# Patient Record
Sex: Male | Born: 1945 | Race: White | Hispanic: No | Marital: Married | Smoking: Never smoker
Health system: Southern US, Community
[De-identification: ages and names within clinical notes are randomized; demographics above are authoritative.]

## PROBLEM LIST (undated history)

## (undated) DIAGNOSIS — C439 Malignant melanoma of skin, unspecified: Secondary | ICD-10-CM

## (undated) DIAGNOSIS — N183 Chronic kidney disease, stage 3 unspecified: Secondary | ICD-10-CM

## (undated) DIAGNOSIS — E785 Hyperlipidemia, unspecified: Secondary | ICD-10-CM

## (undated) DIAGNOSIS — I2510 Atherosclerotic heart disease of native coronary artery without angina pectoris: Secondary | ICD-10-CM

## (undated) DIAGNOSIS — I219 Acute myocardial infarction, unspecified: Secondary | ICD-10-CM

## (undated) DIAGNOSIS — Z86711 Personal history of pulmonary embolism: Secondary | ICD-10-CM

## (undated) DIAGNOSIS — I4819 Other persistent atrial fibrillation: Secondary | ICD-10-CM

## (undated) DIAGNOSIS — I639 Cerebral infarction, unspecified: Secondary | ICD-10-CM

## (undated) DIAGNOSIS — C61 Malignant neoplasm of prostate: Secondary | ICD-10-CM

## (undated) DIAGNOSIS — G459 Transient cerebral ischemic attack, unspecified: Secondary | ICD-10-CM

## (undated) DIAGNOSIS — I10 Essential (primary) hypertension: Secondary | ICD-10-CM

## (undated) DIAGNOSIS — D649 Anemia, unspecified: Secondary | ICD-10-CM

## (undated) DIAGNOSIS — E538 Deficiency of other specified B group vitamins: Secondary | ICD-10-CM

## (undated) DIAGNOSIS — I5189 Other ill-defined heart diseases: Secondary | ICD-10-CM

## (undated) HISTORY — DX: Personal history of pulmonary embolism: Z86.711

## (undated) HISTORY — DX: Atherosclerotic heart disease of native coronary artery without angina pectoris: I25.10

## (undated) HISTORY — DX: Essential (primary) hypertension: I10

## (undated) HISTORY — DX: Cerebral infarction, unspecified: I63.9

## (undated) HISTORY — DX: Deficiency of other specified B group vitamins: E53.8

## (undated) HISTORY — DX: Anemia, unspecified: D64.9

## (undated) HISTORY — PX: CHOLECYSTECTOMY: SHX55

## (undated) HISTORY — PX: KNEE ARTHROSCOPY: SUR90

## (undated) HISTORY — DX: Malignant neoplasm of prostate: C61

## (undated) HISTORY — DX: Hyperlipidemia, unspecified: E78.5

## (undated) HISTORY — DX: Other persistent atrial fibrillation: I48.19

## (undated) HISTORY — DX: Malignant melanoma of skin, unspecified: C43.9

## (undated) HISTORY — PX: PROSTATECTOMY: SHX69

## (undated) HISTORY — PX: TONSILLECTOMY: SUR1361

---

## 1989-10-06 HISTORY — PX: MELANOMA EXCISION: SHX5266

## 2001-03-09 ENCOUNTER — Encounter: Payer: Self-pay | Admitting: Family Medicine

## 2001-03-09 ENCOUNTER — Encounter: Admission: RE | Admit: 2001-03-09 | Discharge: 2001-03-09 | Payer: Self-pay | Admitting: Family Medicine

## 2001-03-22 ENCOUNTER — Encounter: Payer: Self-pay | Admitting: General Surgery

## 2001-03-23 ENCOUNTER — Encounter (INDEPENDENT_AMBULATORY_CARE_PROVIDER_SITE_OTHER): Payer: Self-pay | Admitting: Specialist

## 2001-03-23 ENCOUNTER — Encounter: Payer: Self-pay | Admitting: General Surgery

## 2001-03-23 ENCOUNTER — Observation Stay (HOSPITAL_COMMUNITY): Admission: RE | Admit: 2001-03-23 | Discharge: 2001-03-24 | Payer: Self-pay | Admitting: General Surgery

## 2004-07-03 ENCOUNTER — Encounter: Payer: Self-pay | Admitting: Internal Medicine

## 2004-08-12 ENCOUNTER — Ambulatory Visit: Payer: Self-pay | Admitting: Internal Medicine

## 2004-08-20 ENCOUNTER — Ambulatory Visit: Payer: Self-pay | Admitting: Internal Medicine

## 2004-08-26 ENCOUNTER — Ambulatory Visit: Payer: Self-pay | Admitting: Internal Medicine

## 2004-09-23 ENCOUNTER — Ambulatory Visit: Payer: Self-pay | Admitting: Internal Medicine

## 2004-10-06 HISTORY — PX: COLONOSCOPY: SHX174

## 2004-10-21 ENCOUNTER — Ambulatory Visit: Payer: Self-pay | Admitting: Internal Medicine

## 2004-11-15 ENCOUNTER — Ambulatory Visit: Payer: Self-pay | Admitting: Internal Medicine

## 2004-11-18 ENCOUNTER — Inpatient Hospital Stay (HOSPITAL_COMMUNITY): Admission: RE | Admit: 2004-11-18 | Discharge: 2004-11-21 | Payer: Self-pay | Admitting: Urology

## 2004-11-18 ENCOUNTER — Encounter (INDEPENDENT_AMBULATORY_CARE_PROVIDER_SITE_OTHER): Payer: Self-pay | Admitting: *Deleted

## 2004-12-13 ENCOUNTER — Ambulatory Visit: Payer: Self-pay | Admitting: Internal Medicine

## 2005-01-13 ENCOUNTER — Ambulatory Visit: Payer: Self-pay | Admitting: Internal Medicine

## 2005-02-12 ENCOUNTER — Ambulatory Visit: Payer: Self-pay | Admitting: Internal Medicine

## 2005-03-13 ENCOUNTER — Ambulatory Visit: Payer: Self-pay | Admitting: Internal Medicine

## 2005-04-07 ENCOUNTER — Ambulatory Visit: Payer: Self-pay | Admitting: Gastroenterology

## 2005-04-14 ENCOUNTER — Ambulatory Visit: Payer: Self-pay | Admitting: Internal Medicine

## 2005-04-23 ENCOUNTER — Ambulatory Visit: Payer: Self-pay | Admitting: Gastroenterology

## 2005-05-09 ENCOUNTER — Ambulatory Visit: Payer: Self-pay | Admitting: Internal Medicine

## 2005-06-06 ENCOUNTER — Ambulatory Visit: Payer: Self-pay | Admitting: Internal Medicine

## 2005-07-07 ENCOUNTER — Ambulatory Visit: Payer: Self-pay | Admitting: Internal Medicine

## 2005-08-08 ENCOUNTER — Ambulatory Visit: Payer: Self-pay | Admitting: Internal Medicine

## 2005-09-05 ENCOUNTER — Ambulatory Visit: Payer: Self-pay | Admitting: Internal Medicine

## 2005-09-23 ENCOUNTER — Ambulatory Visit: Payer: Self-pay | Admitting: Internal Medicine

## 2005-10-07 ENCOUNTER — Ambulatory Visit: Payer: Self-pay | Admitting: Internal Medicine

## 2005-11-07 ENCOUNTER — Ambulatory Visit: Payer: Self-pay | Admitting: Internal Medicine

## 2005-12-05 ENCOUNTER — Ambulatory Visit: Payer: Self-pay | Admitting: Internal Medicine

## 2006-01-05 ENCOUNTER — Ambulatory Visit: Payer: Self-pay | Admitting: Internal Medicine

## 2006-02-04 ENCOUNTER — Ambulatory Visit: Payer: Self-pay | Admitting: Internal Medicine

## 2006-02-12 ENCOUNTER — Ambulatory Visit: Payer: Self-pay | Admitting: Cardiology

## 2006-02-12 ENCOUNTER — Ambulatory Visit: Payer: Self-pay | Admitting: Internal Medicine

## 2006-02-12 ENCOUNTER — Observation Stay (HOSPITAL_COMMUNITY): Admission: EM | Admit: 2006-02-12 | Discharge: 2006-02-13 | Payer: Self-pay | Admitting: Emergency Medicine

## 2006-02-16 ENCOUNTER — Ambulatory Visit: Payer: Self-pay

## 2006-02-20 ENCOUNTER — Ambulatory Visit: Payer: Self-pay | Admitting: Internal Medicine

## 2006-03-04 ENCOUNTER — Ambulatory Visit: Payer: Self-pay | Admitting: Internal Medicine

## 2006-04-03 ENCOUNTER — Ambulatory Visit: Payer: Self-pay | Admitting: Internal Medicine

## 2006-05-06 ENCOUNTER — Ambulatory Visit: Payer: Self-pay | Admitting: Internal Medicine

## 2006-06-09 ENCOUNTER — Ambulatory Visit: Payer: Self-pay | Admitting: Internal Medicine

## 2006-06-23 ENCOUNTER — Ambulatory Visit: Payer: Self-pay | Admitting: Internal Medicine

## 2006-07-07 ENCOUNTER — Ambulatory Visit: Payer: Self-pay | Admitting: Internal Medicine

## 2006-08-04 ENCOUNTER — Ambulatory Visit: Payer: Self-pay | Admitting: Internal Medicine

## 2006-09-07 ENCOUNTER — Ambulatory Visit: Payer: Self-pay | Admitting: Internal Medicine

## 2006-09-07 LAB — CONVERTED CEMR LAB
Chol/HDL Ratio, serum: 4.7
Cholesterol: 213 mg/dL (ref 0–200)
Triglyceride fasting, serum: 220 mg/dL (ref 0–149)
VLDL: 44 mg/dL — ABNORMAL HIGH (ref 0–40)

## 2006-10-05 ENCOUNTER — Ambulatory Visit: Payer: Self-pay | Admitting: Internal Medicine

## 2006-11-05 ENCOUNTER — Ambulatory Visit: Payer: Self-pay | Admitting: Internal Medicine

## 2006-12-04 ENCOUNTER — Ambulatory Visit: Payer: Self-pay | Admitting: Internal Medicine

## 2007-01-04 ENCOUNTER — Ambulatory Visit: Payer: Self-pay | Admitting: Internal Medicine

## 2007-01-20 DIAGNOSIS — E781 Pure hyperglyceridemia: Secondary | ICD-10-CM | POA: Insufficient documentation

## 2007-01-20 DIAGNOSIS — Z9089 Acquired absence of other organs: Secondary | ICD-10-CM | POA: Insufficient documentation

## 2007-02-01 ENCOUNTER — Ambulatory Visit: Payer: Self-pay | Admitting: Internal Medicine

## 2007-03-02 ENCOUNTER — Ambulatory Visit: Payer: Self-pay | Admitting: Internal Medicine

## 2007-03-30 ENCOUNTER — Ambulatory Visit: Payer: Self-pay | Admitting: Internal Medicine

## 2007-04-29 ENCOUNTER — Ambulatory Visit: Payer: Self-pay | Admitting: Internal Medicine

## 2007-05-27 ENCOUNTER — Ambulatory Visit: Payer: Self-pay | Admitting: Internal Medicine

## 2007-05-27 ENCOUNTER — Encounter (INDEPENDENT_AMBULATORY_CARE_PROVIDER_SITE_OTHER): Payer: Self-pay | Admitting: *Deleted

## 2007-05-27 DIAGNOSIS — D239 Other benign neoplasm of skin, unspecified: Secondary | ICD-10-CM | POA: Insufficient documentation

## 2007-06-08 ENCOUNTER — Encounter (INDEPENDENT_AMBULATORY_CARE_PROVIDER_SITE_OTHER): Payer: Self-pay | Admitting: *Deleted

## 2007-06-17 ENCOUNTER — Encounter: Payer: Self-pay | Admitting: Internal Medicine

## 2007-07-01 ENCOUNTER — Ambulatory Visit: Payer: Self-pay | Admitting: Internal Medicine

## 2007-08-02 ENCOUNTER — Ambulatory Visit: Payer: Self-pay | Admitting: Internal Medicine

## 2007-08-30 ENCOUNTER — Ambulatory Visit: Payer: Self-pay | Admitting: Internal Medicine

## 2007-09-27 ENCOUNTER — Ambulatory Visit: Payer: Self-pay | Admitting: Internal Medicine

## 2007-10-13 ENCOUNTER — Ambulatory Visit: Payer: Self-pay | Admitting: Internal Medicine

## 2007-10-13 DIAGNOSIS — I10 Essential (primary) hypertension: Secondary | ICD-10-CM | POA: Insufficient documentation

## 2007-10-19 ENCOUNTER — Encounter (INDEPENDENT_AMBULATORY_CARE_PROVIDER_SITE_OTHER): Payer: Self-pay | Admitting: *Deleted

## 2007-10-19 ENCOUNTER — Ambulatory Visit: Payer: Self-pay | Admitting: Internal Medicine

## 2007-10-28 ENCOUNTER — Ambulatory Visit: Payer: Self-pay | Admitting: Internal Medicine

## 2007-10-28 DIAGNOSIS — E785 Hyperlipidemia, unspecified: Secondary | ICD-10-CM | POA: Insufficient documentation

## 2007-11-24 ENCOUNTER — Telehealth (INDEPENDENT_AMBULATORY_CARE_PROVIDER_SITE_OTHER): Payer: Self-pay | Admitting: *Deleted

## 2007-11-25 ENCOUNTER — Ambulatory Visit: Payer: Self-pay | Admitting: Internal Medicine

## 2007-12-23 ENCOUNTER — Ambulatory Visit: Payer: Self-pay | Admitting: Internal Medicine

## 2008-01-25 ENCOUNTER — Ambulatory Visit: Payer: Self-pay | Admitting: Internal Medicine

## 2008-02-22 ENCOUNTER — Ambulatory Visit: Payer: Self-pay | Admitting: Internal Medicine

## 2008-03-23 ENCOUNTER — Ambulatory Visit: Payer: Self-pay | Admitting: Internal Medicine

## 2008-04-20 ENCOUNTER — Ambulatory Visit: Payer: Self-pay | Admitting: Internal Medicine

## 2008-05-18 ENCOUNTER — Ambulatory Visit: Payer: Self-pay | Admitting: Internal Medicine

## 2008-06-15 ENCOUNTER — Ambulatory Visit: Payer: Self-pay | Admitting: Internal Medicine

## 2008-07-13 ENCOUNTER — Ambulatory Visit: Payer: Self-pay | Admitting: Internal Medicine

## 2008-08-10 ENCOUNTER — Ambulatory Visit: Payer: Self-pay | Admitting: Internal Medicine

## 2008-09-07 ENCOUNTER — Ambulatory Visit: Payer: Self-pay | Admitting: Internal Medicine

## 2008-10-05 ENCOUNTER — Ambulatory Visit: Payer: Self-pay | Admitting: Internal Medicine

## 2008-11-02 ENCOUNTER — Ambulatory Visit: Payer: Self-pay | Admitting: Internal Medicine

## 2008-11-30 ENCOUNTER — Ambulatory Visit: Payer: Self-pay | Admitting: Internal Medicine

## 2008-12-28 ENCOUNTER — Ambulatory Visit: Payer: Self-pay | Admitting: Internal Medicine

## 2009-01-12 ENCOUNTER — Telehealth (INDEPENDENT_AMBULATORY_CARE_PROVIDER_SITE_OTHER): Payer: Self-pay | Admitting: *Deleted

## 2009-01-25 ENCOUNTER — Ambulatory Visit: Payer: Self-pay | Admitting: Internal Medicine

## 2009-02-22 ENCOUNTER — Ambulatory Visit: Payer: Self-pay | Admitting: Internal Medicine

## 2009-03-09 ENCOUNTER — Telehealth (INDEPENDENT_AMBULATORY_CARE_PROVIDER_SITE_OTHER): Payer: Self-pay | Admitting: *Deleted

## 2009-03-22 ENCOUNTER — Ambulatory Visit: Payer: Self-pay | Admitting: Internal Medicine

## 2009-04-17 ENCOUNTER — Telehealth (INDEPENDENT_AMBULATORY_CARE_PROVIDER_SITE_OTHER): Payer: Self-pay | Admitting: *Deleted

## 2009-04-19 ENCOUNTER — Telehealth (INDEPENDENT_AMBULATORY_CARE_PROVIDER_SITE_OTHER): Payer: Self-pay | Admitting: *Deleted

## 2009-04-19 ENCOUNTER — Ambulatory Visit: Payer: Self-pay | Admitting: Internal Medicine

## 2009-04-20 ENCOUNTER — Telehealth (INDEPENDENT_AMBULATORY_CARE_PROVIDER_SITE_OTHER): Payer: Self-pay | Admitting: *Deleted

## 2009-04-24 ENCOUNTER — Ambulatory Visit: Payer: Self-pay | Admitting: Internal Medicine

## 2009-04-24 DIAGNOSIS — Z85820 Personal history of malignant melanoma of skin: Secondary | ICD-10-CM | POA: Insufficient documentation

## 2009-04-24 DIAGNOSIS — C439 Malignant melanoma of skin, unspecified: Secondary | ICD-10-CM | POA: Insufficient documentation

## 2009-04-24 DIAGNOSIS — D510 Vitamin B12 deficiency anemia due to intrinsic factor deficiency: Secondary | ICD-10-CM | POA: Insufficient documentation

## 2009-04-24 DIAGNOSIS — G250 Essential tremor: Secondary | ICD-10-CM | POA: Insufficient documentation

## 2009-04-24 DIAGNOSIS — Z8546 Personal history of malignant neoplasm of prostate: Secondary | ICD-10-CM | POA: Insufficient documentation

## 2009-04-24 DIAGNOSIS — R251 Tremor, unspecified: Secondary | ICD-10-CM | POA: Insufficient documentation

## 2009-04-24 DIAGNOSIS — G252 Other specified forms of tremor: Secondary | ICD-10-CM

## 2009-04-24 LAB — CONVERTED CEMR LAB: LDL Goal: 130 mg/dL

## 2009-04-25 ENCOUNTER — Encounter: Payer: Self-pay | Admitting: Internal Medicine

## 2009-05-02 ENCOUNTER — Encounter (INDEPENDENT_AMBULATORY_CARE_PROVIDER_SITE_OTHER): Payer: Self-pay | Admitting: *Deleted

## 2009-05-02 LAB — CONVERTED CEMR LAB
ALT: 24 units/L (ref 0–53)
BUN: 25 mg/dL — ABNORMAL HIGH (ref 6–23)
Basophils Absolute: 0 10*3/uL (ref 0.0–0.1)
Calcium: 9.2 mg/dL (ref 8.4–10.5)
Direct LDL: 121.5 mg/dL
Eosinophils Absolute: 0.1 10*3/uL (ref 0.0–0.7)
Glucose, Bld: 81 mg/dL (ref 70–99)
HDL: 44.7 mg/dL (ref >39.00)
MCHC: 34.2 g/dL (ref 30.0–36.0)
MCV: 84.6 fL (ref 78.0–100.0)
Monocytes Absolute: 0.4 10*3/uL (ref 0.1–1.0)
Neutrophils Relative %: 56.1 % (ref 43.0–77.0)
Potassium: 4 meq/L (ref 3.5–5.1)
RBC: 4.69 M/uL (ref 4.22–5.81)
TSH: 2.22 microintl units/mL (ref 0.35–5.50)
Total Bilirubin: 1 mg/dL (ref 0.3–1.2)
Total CHOL/HDL Ratio: 5
Total Protein: 7 g/dL (ref 6.0–8.3)
Triglycerides: 169 mg/dL — ABNORMAL HIGH (ref 0.0–149.0)
Vitamin B-12: 666 pg/mL (ref 211–911)

## 2009-05-17 ENCOUNTER — Ambulatory Visit: Payer: Self-pay | Admitting: Internal Medicine

## 2009-06-18 ENCOUNTER — Ambulatory Visit: Payer: Self-pay | Admitting: Internal Medicine

## 2009-07-18 ENCOUNTER — Ambulatory Visit: Payer: Self-pay | Admitting: Internal Medicine

## 2009-08-22 ENCOUNTER — Ambulatory Visit: Payer: Self-pay | Admitting: Internal Medicine

## 2009-09-21 ENCOUNTER — Ambulatory Visit: Payer: Self-pay | Admitting: Internal Medicine

## 2009-10-06 HISTORY — PX: MELANOMA EXCISION: SHX5266

## 2009-10-22 ENCOUNTER — Ambulatory Visit: Payer: Self-pay | Admitting: Internal Medicine

## 2009-11-15 ENCOUNTER — Ambulatory Visit: Payer: Self-pay | Admitting: Internal Medicine

## 2009-11-26 ENCOUNTER — Ambulatory Visit: Payer: Self-pay | Admitting: Internal Medicine

## 2009-11-28 ENCOUNTER — Telehealth: Payer: Self-pay | Admitting: Internal Medicine

## 2009-11-28 DIAGNOSIS — R05 Cough: Secondary | ICD-10-CM | POA: Insufficient documentation

## 2009-11-28 DIAGNOSIS — R051 Acute cough: Secondary | ICD-10-CM | POA: Insufficient documentation

## 2009-11-28 DIAGNOSIS — R059 Cough, unspecified: Secondary | ICD-10-CM | POA: Insufficient documentation

## 2009-11-29 ENCOUNTER — Ambulatory Visit: Payer: Self-pay | Admitting: Internal Medicine

## 2009-11-30 ENCOUNTER — Telehealth: Payer: Self-pay | Admitting: Family Medicine

## 2009-12-10 LAB — CONVERTED CEMR LAB
Basophils Absolute: 0 10*3/uL (ref 0.0–0.1)
Basophils Relative: 0.5 % (ref 0.0–3.0)
Eosinophils Absolute: 0.3 10*3/uL (ref 0.0–0.7)
HCT: 42.1 % (ref 39.0–52.0)
Hemoglobin: 14 g/dL (ref 13.0–17.0)
Lymphocytes Relative: 27.9 % (ref 12.0–46.0)
Lymphs Abs: 1.6 10*3/uL (ref 0.7–4.0)
Monocytes Relative: 6.8 % (ref 3.0–12.0)
Neutro Abs: 3.6 10*3/uL (ref 1.4–7.7)
RDW: 13.6 % (ref 11.5–14.6)

## 2009-12-25 ENCOUNTER — Telehealth (INDEPENDENT_AMBULATORY_CARE_PROVIDER_SITE_OTHER): Payer: Self-pay | Admitting: *Deleted

## 2009-12-25 ENCOUNTER — Ambulatory Visit: Payer: Self-pay | Admitting: Internal Medicine

## 2010-01-23 ENCOUNTER — Telehealth (INDEPENDENT_AMBULATORY_CARE_PROVIDER_SITE_OTHER): Payer: Self-pay | Admitting: *Deleted

## 2010-01-23 ENCOUNTER — Ambulatory Visit: Payer: Self-pay | Admitting: Internal Medicine

## 2010-02-03 HISTORY — PX: OTHER SURGICAL HISTORY: SHX169

## 2010-02-05 ENCOUNTER — Telehealth: Payer: Self-pay | Admitting: Internal Medicine

## 2010-02-11 ENCOUNTER — Ambulatory Visit: Payer: Self-pay | Admitting: Cardiology

## 2010-02-11 ENCOUNTER — Inpatient Hospital Stay (HOSPITAL_COMMUNITY): Admission: EM | Admit: 2010-02-11 | Discharge: 2010-02-13 | Payer: Self-pay | Admitting: Emergency Medicine

## 2010-02-19 ENCOUNTER — Encounter: Payer: Self-pay | Admitting: Cardiology

## 2010-02-22 ENCOUNTER — Ambulatory Visit: Payer: Self-pay | Admitting: Internal Medicine

## 2010-03-01 ENCOUNTER — Ambulatory Visit: Payer: Self-pay | Admitting: Cardiology

## 2010-03-01 DIAGNOSIS — I2510 Atherosclerotic heart disease of native coronary artery without angina pectoris: Secondary | ICD-10-CM | POA: Insufficient documentation

## 2010-03-05 ENCOUNTER — Ambulatory Visit (HOSPITAL_COMMUNITY): Admission: RE | Admit: 2010-03-05 | Discharge: 2010-03-05 | Payer: Self-pay | Admitting: Cardiology

## 2010-03-05 ENCOUNTER — Encounter: Payer: Self-pay | Admitting: Cardiology

## 2010-03-20 ENCOUNTER — Telehealth: Payer: Self-pay | Admitting: Cardiology

## 2010-03-22 ENCOUNTER — Ambulatory Visit: Payer: Self-pay | Admitting: Internal Medicine

## 2010-03-28 ENCOUNTER — Telehealth: Payer: Self-pay | Admitting: Cardiology

## 2010-03-29 ENCOUNTER — Ambulatory Visit: Payer: Self-pay

## 2010-03-29 ENCOUNTER — Ambulatory Visit: Payer: Self-pay | Admitting: Cardiology

## 2010-03-29 ENCOUNTER — Encounter: Payer: Self-pay | Admitting: Cardiology

## 2010-03-29 ENCOUNTER — Ambulatory Visit (HOSPITAL_COMMUNITY): Admission: RE | Admit: 2010-03-29 | Discharge: 2010-03-29 | Payer: Self-pay | Admitting: Cardiology

## 2010-04-15 ENCOUNTER — Ambulatory Visit: Payer: Self-pay | Admitting: Cardiology

## 2010-04-18 ENCOUNTER — Encounter: Payer: Self-pay | Admitting: Cardiology

## 2010-04-19 ENCOUNTER — Ambulatory Visit: Payer: Self-pay | Admitting: Internal Medicine

## 2010-04-23 LAB — CONVERTED CEMR LAB
AST: 26 units/L (ref 0–37)
Albumin: 4.1 g/dL (ref 3.5–5.2)
Alkaline Phosphatase: 58 units/L (ref 39–117)
Basophils Absolute: 0 10*3/uL (ref 0.0–0.1)
Basophils Relative: 0.5 % (ref 0.0–3.0)
Bilirubin, Direct: 0.1 mg/dL (ref 0.0–0.3)
Direct LDL: 57.7 mg/dL
Lymphocytes Relative: 31.2 % (ref 12.0–46.0)
Lymphs Abs: 1.5 10*3/uL (ref 0.7–4.0)
MCHC: 35.1 g/dL (ref 30.0–36.0)
MCV: 84.3 fL (ref 78.0–100.0)
Monocytes Absolute: 0.4 10*3/uL (ref 0.1–1.0)
Neutro Abs: 2.7 10*3/uL (ref 1.4–7.7)
Neutrophils Relative %: 54.3 % (ref 43.0–77.0)
RBC: 4.45 M/uL (ref 4.22–5.81)
RDW: 14.4 % (ref 11.5–14.6)
Triglycerides: 232 mg/dL — ABNORMAL HIGH (ref 0.0–149.0)
WBC: 5 10*3/uL (ref 4.5–10.5)

## 2010-05-06 ENCOUNTER — Ambulatory Visit: Payer: Self-pay | Admitting: Internal Medicine

## 2010-05-13 LAB — CONVERTED CEMR LAB
AST: 22 units/L (ref 0–37)
Albumin: 4.2 g/dL (ref 3.5–5.2)
Alkaline Phosphatase: 61 units/L (ref 39–117)
Basophils Absolute: 0 10*3/uL (ref 0.0–0.1)
CO2: 30 meq/L (ref 19–32)
Calcium: 9.2 mg/dL (ref 8.4–10.5)
Creatinine, Ser: 1.6 mg/dL — ABNORMAL HIGH (ref 0.4–1.5)
Eosinophils Absolute: 0.3 10*3/uL (ref 0.0–0.7)
HCT: 38.8 % — ABNORMAL LOW (ref 39.0–52.0)
Hemoglobin: 13.4 g/dL (ref 13.0–17.0)
MCHC: 34.4 g/dL (ref 30.0–36.0)
Monocytes Absolute: 0.4 10*3/uL (ref 0.1–1.0)
Neutro Abs: 2.8 10*3/uL (ref 1.4–7.7)
PSA: 0.01 ng/mL — ABNORMAL LOW (ref 0.10–4.00)
Platelets: 156 10*3/uL (ref 150.0–400.0)
RBC: 4.58 M/uL (ref 4.22–5.81)
RDW: 15.1 % — ABNORMAL HIGH (ref 11.5–14.6)
Total Bilirubin: 0.7 mg/dL (ref 0.3–1.2)
VLDL: 56.8 mg/dL — ABNORMAL HIGH (ref 0.0–40.0)
Vitamin B-12: 489 pg/mL (ref 211–911)

## 2010-05-17 ENCOUNTER — Ambulatory Visit: Payer: Self-pay | Admitting: Internal Medicine

## 2010-06-03 ENCOUNTER — Telehealth (INDEPENDENT_AMBULATORY_CARE_PROVIDER_SITE_OTHER): Payer: Self-pay | Admitting: *Deleted

## 2010-06-03 ENCOUNTER — Ambulatory Visit: Payer: Self-pay | Admitting: Family Medicine

## 2010-06-03 DIAGNOSIS — L299 Pruritus, unspecified: Secondary | ICD-10-CM | POA: Insufficient documentation

## 2010-06-04 LAB — CONVERTED CEMR LAB
AST: 21 units/L (ref 0–37)
Alkaline Phosphatase: 62 units/L (ref 39–117)
Bilirubin, Direct: 0.1 mg/dL (ref 0.0–0.3)
CO2: 30 meq/L (ref 19–32)
Calcium: 9.3 mg/dL (ref 8.4–10.5)
Chloride: 104 meq/L (ref 96–112)
Creatinine, Ser: 1.5 mg/dL (ref 0.4–1.5)
Potassium: 4.4 meq/L (ref 3.5–5.1)
Sodium: 142 meq/L (ref 135–145)
Total Protein: 6.6 g/dL (ref 6.0–8.3)

## 2010-06-14 ENCOUNTER — Ambulatory Visit: Payer: Self-pay | Admitting: Internal Medicine

## 2010-06-14 DIAGNOSIS — N183 Chronic kidney disease, stage 3 unspecified: Secondary | ICD-10-CM | POA: Insufficient documentation

## 2010-06-14 DIAGNOSIS — D489 Neoplasm of uncertain behavior, unspecified: Secondary | ICD-10-CM | POA: Insufficient documentation

## 2010-06-17 ENCOUNTER — Ambulatory Visit: Payer: Self-pay | Admitting: Cardiology

## 2010-07-12 ENCOUNTER — Ambulatory Visit: Payer: Self-pay | Admitting: Family Medicine

## 2010-08-13 ENCOUNTER — Ambulatory Visit: Payer: Self-pay | Admitting: Internal Medicine

## 2010-09-10 ENCOUNTER — Ambulatory Visit: Payer: Self-pay | Admitting: Cardiology

## 2010-09-10 ENCOUNTER — Encounter: Payer: Self-pay | Admitting: Cardiology

## 2010-09-12 ENCOUNTER — Ambulatory Visit: Payer: Self-pay | Admitting: Internal Medicine

## 2010-10-14 ENCOUNTER — Ambulatory Visit
Admission: RE | Admit: 2010-10-14 | Discharge: 2010-10-14 | Payer: Self-pay | Source: Home / Self Care | Attending: Internal Medicine | Admitting: Internal Medicine

## 2010-10-14 ENCOUNTER — Other Ambulatory Visit: Payer: Self-pay | Admitting: Internal Medicine

## 2010-10-14 LAB — LIPID PANEL
Cholesterol: 145 mg/dL (ref 0–200)
HDL: 36.6 mg/dL — ABNORMAL LOW (ref >39.00)
Total CHOL/HDL Ratio: 4
Triglycerides: 287 mg/dL — ABNORMAL HIGH (ref 0.0–149.0)
VLDL: 57.4 mg/dL — ABNORMAL HIGH (ref 0.0–40.0)

## 2010-10-14 LAB — LDL CHOLESTEROL, DIRECT: Direct LDL: 76.2 mg/dL

## 2010-10-25 ENCOUNTER — Telehealth (INDEPENDENT_AMBULATORY_CARE_PROVIDER_SITE_OTHER): Payer: Self-pay | Admitting: *Deleted

## 2010-11-01 ENCOUNTER — Ambulatory Visit
Admission: RE | Admit: 2010-11-01 | Discharge: 2010-11-01 | Payer: Self-pay | Source: Home / Self Care | Attending: Family Medicine | Admitting: Family Medicine

## 2010-11-01 DIAGNOSIS — J45909 Unspecified asthma, uncomplicated: Secondary | ICD-10-CM | POA: Insufficient documentation

## 2010-11-03 LAB — CONVERTED CEMR LAB
AST: 25 units/L (ref 0–37)
Albumin: 4.2 g/dL (ref 3.5–5.2)
Alkaline Phosphatase: 63 units/L (ref 39–117)
BUN: 20 mg/dL (ref 6–23)
Basophils Absolute: 0 10*3/uL (ref 0.0–0.1)
Basophils Relative: 0.4 % (ref 0.0–1.0)
Bilirubin, Direct: 0.1 mg/dL (ref 0.0–0.3)
Creatinine, Ser: 1.5 mg/dL (ref 0.4–1.5)
Eosinophils Absolute: 0.2 10*3/uL (ref 0.0–0.6)
Eosinophils Absolute: 0.2 10*3/uL (ref 0.0–0.6)
Eosinophils Relative: 3.6 % (ref 0.0–5.0)
Eosinophils Relative: 4.3 % (ref 0.0–5.0)
Free T4: 0.8 ng/dL (ref 0.6–1.6)
Free T4: 0.8 ng/dL (ref 0.6–1.6)
GFR calc non Af Amer: 50 mL/min
HCT: 37.8 % — ABNORMAL LOW (ref 39.0–52.0)
HDL goal, serum: 40 mg/dL
Hemoglobin: 13.2 g/dL (ref 13.0–17.0)
Monocytes Absolute: 0.5 10*3/uL (ref 0.2–0.7)
Neutro Abs: 3 10*3/uL (ref 1.4–7.7)
Neutrophils Relative %: 58.5 % (ref 43.0–77.0)
Platelets: 189 10*3/uL (ref 150–400)
Platelets: 192 10*3/uL (ref 150–400)
Potassium: 3.7 meq/L (ref 3.5–5.1)
RBC: 4.53 M/uL (ref 4.22–5.81)
RBC: 4.94 M/uL (ref 4.22–5.81)
RDW: 13.6 % (ref 11.5–14.6)
RDW: 14.1 % (ref 11.5–14.6)
Sodium: 138 meq/L (ref 135–145)
TSH: 2.77 microintl units/mL (ref 0.35–5.50)

## 2010-11-04 ENCOUNTER — Ambulatory Visit
Admission: RE | Admit: 2010-11-04 | Discharge: 2010-11-04 | Payer: Self-pay | Source: Home / Self Care | Attending: Internal Medicine | Admitting: Internal Medicine

## 2010-11-07 NOTE — Assessment & Plan Note (Signed)
Summary: per check out/sf      Allergies Added:   Primary [name]:  Dr. Alwyn Ren  CC:  rov.  Pt states he is feeling pretty good. He does also state that he is SOB more easily though and has no energy.  History of Present Illness: 65 yo with history of CAD s/p recent NSTEMI presents for followup.  Patient was admitted to [facility] in 5/11 with NSTEMI.  He had a PROMUS DES to the mid RCA.  He had residual moderate disease (60-70%) in the LAD that was not intervened upon.    Patient has been getting to his usual activities since his MI.  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  He has noted chest pain ("like gas") with heavy exertion such as using his chain saw or weed-eater.  He attributes this to GERD.  He also has noted shortness of breath (tends to be mild) after walking up a hill.  He was unable to tolerate lisinopril even at a low dose so has stopped it.  He is taking his other meds.  P2Y12 testing showed good platelet inhibition with Plavix.  Recent echo showed preserved EF.  Recent labs showed elevated triglycerides and low HDL.  Patient has already talked to Dr. Alwyn Ren about dietary changes to help this.    Labs (5/11): creatinine 1.56, TGs 478, HDL 35, P2Y12 testing with PRU < 230 (goal) Labs (8/11): LDL 69, HDL 35, TGs 284, creatinine 1.5, TSH normal  Current Medications (verified): 1)  B-12 Injection .... Q Month 2)  Otc K+ .... Prn 3)  Aspirin 325 Mg Tabs (Aspirin) .Marland Kitchen.. 1 By Mouth Once Daily 4)  Metoprolol Tartrate 50 Mg Tabs (Metoprolol Tartrate) .Marland Kitchen.. 1 By Mouth  Two Times A Day 5)  Plavix 75 Mg Tabs (Clopidogrel Bisulfate) .Marland Kitchen.. 1 By Mouth Once Daily 6)  Zocor 40 Mg Tabs (Simvastatin) .Marland Kitchen.. 1 By Mouth Once Daily 7)  Fish Oil 1000 Mg Caps (Omega-3 Fatty Acids) .... One Tablet Twice A Day  Allergies (verified): 1)  ! * Hydroxyzine 2)  Lidocaine  Past History:  Past Medical History: 1. Hypertension: Did not tolerate lisinopril even at low dose.  2. Hyperlipidemia 3. mild  anemia, B12 deficiency,PMH of 4. Prostate cancer, hx of 5. Skin cancer, PMH  of 6. S/P  TKR 7. CAD: NSTEMI 5/11.  LHC showed 60% prox LAD, 70% mid LAD, 95% mid RCA with TIMI 2 flow distally, EF 60%.  Patient had PROMUS DES to [facility].  8. Renal insufficiency, creatinine 1.5  in 05/2010  Family History: Reviewed history from 06/14/2010 and no changes required. mother :CAD S/P  CABG  in 10s, CVA father: prostate cancer; bro: HTN  Social History: Reviewed history from 06/14/2010 and no changes required. Never Smoked Retired from [employer], works on his farm in [place] Alcohol use-no Regular exercise: physically active on farm Married  Review of Systems       All systems reviewed and negative except as per HPI.   Vital Signs:  Patient profile:   65 year old male Height:      75 inches Weight:      218 pounds BMI:     27.35 Pulse rate:   62 / minute Pulse rhythm:   regular BP sitting:   136 / 78  (left arm) Cuff size:   regular  Vitals Entered By: Judithe Modest CMA (June 17, 2010 10:46 AM)  Physical Exam  General:  Well developed, well nourished, in no  acute distress. Neck:  Neck supple, no JVD. No masses, thyromegaly or abnormal cervical nodes. Lungs:  Clear bilaterally to auscultation and percussion. Heart:  Non-displaced PMI, chest non-tender; regular rate and rhythm, S1, S2 without murmurs, rubs. Soft S4. Carotid upstroke normal, no bruit. Pedals normal pulses. No edema, no varicosities. Abdomen:  Bowel sounds positive; abdomen soft and non-tender without masses, organomegaly, or hernias noted. No hepatosplenomegaly. Extremities:  No clubbing or cyanosis. Neurologic:  Alert and oriented x 3. Psych:  Normal affect.   Impression & Recommendations:  Problem # 1:  CORONARY ATHEROSCLEROSIS NATIVE CORONARY ARTERY (ICD-414.01) Status post DES to the RCA in the setting of NSTEMI.  Patient had residual moderate disease in the LAD.  He now reports symptoms  consistent with angina with heavy exertion.  He has not had symptoms with mild to moderate exertion or at rest.  I had a long discussion this him about these symptoms.  His wife has been worried.  I offered a stress myoview to assess for ischemia in the LAD distribution with PCI if there was significant ischemia.  He does not want any further procedures if he can help it and thinks that the symptoms are mild enough that he can tolerate them.  He does not want to try Imdur.  It is reasonably to proceed conservatively at this point as his symptoms are not unstable and really intervention would not be expected in this setting to decrease mortality or MI risk (would only potentially decrease symptoms).  He will let me know if the symptoms worsen or come more often and I will see him back to reassess in 3 months.  He will continue ASA, metoprolol, Plavix, and statin.   Problem # 2:  HYPERLIPIDEMIA (ICD-272.4) LDL was at goal when last checked.  HDL is low and triglycerides are high.  He has been talking to Dr. Alwyn Ren about dietary changes to help this profile.  Continue current statin dose.   Problem # 3:  HYPERTENSION (ICD-401.9) BP is at goal today.   Patient Instructions: 1)  Appointment with Dr Shirlee Latch Tuesday December 6,2011 at 8:15am.:

## 2010-11-07 NOTE — Assessment & Plan Note (Signed)
Summary: b12/cbs   Nurse Visit  CC: B-12 inj./kb   Allergies: 1)  ! * Hydroxyzine 2)  Lidocaine  Medication Administration  Injection # 1:    Medication: Vit B12 1000 mcg    Diagnosis: PERNICIOUS ANEMIA (ICD-281.0)    Route: IM    Site: R deltoid    Exp Date: 12/05/2011    Lot #: [id number]    Mfr: American Regent    Patient tolerated injection without complications    Given by: Lucious Groves CMA (July 12, 2010 10:16 AM)  Orders Added: 1)  Vit B12 1000 mcg [J3420] 2)  Admin of Therapeutic Inj  intramuscular or subcutaneous [16109]

## 2010-11-07 NOTE — Assessment & Plan Note (Signed)
Summary: B-12 SHOT///SPH   Nurse Visit  CC: B-12 inj./kb   Allergies: 1)  ! * Hydroxyzine 2)  Lidocaine  Medication Administration  Injection # 1:    Medication: Vit B12 1000 mcg    Diagnosis: PERNICIOUS ANEMIA (ICD-281.0)    Route: IM    Site: L deltoid    Exp Date: 12/05/2011    Lot #: [id number]    Mfr: American Regent    Patient tolerated injection without complications    Given by: Lucious Groves CMA (September 12, 2010 9:07 AM)  Orders Added: 1)  Vit B12 1000 mcg [J3420] 2)  Admin of Therapeutic Inj  intramuscular or subcutaneous [16109]

## 2010-11-07 NOTE — Progress Notes (Signed)
Summary: LABS ORDERS   Phone Note Call from Patient   Summary of Call: PATIENT CAME IN AND MADE AND APPT FOR LABS WORK TO BE DONE ON 05-06-10. TO CHECK AND SEE WHEN HE CAN STOP TAKING THOSE B-12 SHOT. NEED ORDERS FOR THIS TO BE PUT IN ON THAT DAY Initial call taken by: Freddy Jaksch,  January 23, 2010 11:22 AM  Follow-up for Phone Call        Patient will be due for CPX 04/24/10 or later. These labs below can be before that appointment or on the same day (Includes b12 level)   LIPID,HEP,CBCD,BMP,TSH,PSA,B12,STOOL CARDS,UDIP 995.20/285.9/401.9/272.4 Follow-up by: Shonna Chock,  January 23, 2010 1:25 PM  Additional Follow-up for Phone Call Additional follow up Details #1::        ORDERS PUT INTO PLACE Additional Follow-up by: Freddy Jaksch,  January 24, 2010 8:58 AM

## 2010-11-07 NOTE — Assessment & Plan Note (Signed)
Summary: b12 inj//lch   Nurse Visit   Allergies: 1)  Lidocaine  Medication Administration  Injection # 1:    Medication: Vit B12 1000 mcg    Diagnosis: PERNICIOUS ANEMIA (ICD-281.0)    Route: IM    Site: R deltoid    Exp Date: 08/2011    Lot #: [id number]    Mfr: American Regent    Patient tolerated injection without complications    Given by: Shonna Chock (January 23, 2010 9:00 AM)  Orders Added: 1)  Vit B12 1000 mcg [J3420] 2)  Admin of Therapeutic Inj  intramuscular or subcutaneous [16109]

## 2010-11-07 NOTE — Assessment & Plan Note (Signed)
Summary: f42m  Medications Added * OTC K+ daily        Primary Provider:  Dr. Alwyn Ren  CC:  tiredness.  History of Present Illness: 65 yo with history of CAD s/p recent NSTEMI presents for followup.  Patient was admitted to [facility] in 5/11 with NSTEMI.  He had a PROMUS DES to the mid RCA.  He had residual moderate disease (60-70%) in the LAD that was not intervened upon.   P2Y12 testing showed good platelet inhibition with Plavix.  Recent echo showed preserved EF.    Since last appointment, patient has been doing well.  He has had no further episodes of atypical chest pain and thinks that what he was having before may have been GI-related.  No exertional dyspnea.  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  In fact, he just roofed a barn on his property.  His main complaint is low energy level/fatigue.  This has been going on for a while.  His TSH was normal in 8/11.  He has B12 insufficiency but is being treated and his hemoglobin level has been ok.  He says that he snores but does not think that it is excessive and his wife has never told him that he gasps or stops breathing.   Labs (5/11): creatinine 1.56, TGs 478, HDL 35, P2Y12 testing with PRU < 230 (goal) Labs (8/11): LDL 69, HDL 35, TGs 284, creatinine 1.5, TSH normal  ECG: NSR, normal  Current Medications (verified): 1)  B-12 Injection .... Q Month 2)  Otc K+ .... Daily 3)  Aspirin 325 Mg Tabs (Aspirin) .Marland Kitchen.. 1 By Mouth Once Daily 4)  Metoprolol Tartrate 50 Mg Tabs (Metoprolol Tartrate) .Marland Kitchen.. 1 By Mouth  Two Times A Day 5)  Plavix 75 Mg Tabs (Clopidogrel Bisulfate) .Marland Kitchen.. 1 By Mouth Once Daily 6)  Zocor 40 Mg Tabs (Simvastatin) .Marland Kitchen.. 1 By Mouth Once Daily 7)  Fish Oil 1000 Mg Caps (Omega-3 Fatty Acids) .... One Tablet Twice A Day  Allergies: 1)  ! * Hydroxyzine 2)  Lidocaine  Past History:  Past Medical History: 1. Hypertension: Did not tolerate lisinopril even at low dose.  2.  Hyperlipidemia 3. Mild anemia, B12 deficiency,PMH of 4. Prostate cancer, hx of 5. Skin cancer, PMH  of 6. S/P  TKR 7. CAD: NSTEMI 5/11.  LHC showed 60% prox LAD, 70% mid LAD, 95% mid RCA with TIMI 2 flow distally, EF 60%.  Patient had PROMUS DES to [facility].  8. Renal insufficiency, creatinine 1.5  in 05/2010  Family History: Reviewed history from 09/09/2010 and no changes required. mother :CAD S/P  CABG  in 30s, CVA father: prostate cancer; bro: HTN  Social History: Reviewed history from 06/14/2010 and no changes required. Never Smoked Retired from 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 Neck:  Neck supple, no JVD. No masses, thyromegaly or abnormal cervical nodes. Lungs:  Clear bilaterally to auscultation and  percussion. Heart:  Non-displaced PMI, chest non-tender; regular rate and rhythm, S1, S2 without murmurs, rubs. Soft S4. Carotid upstroke normal, no bruit. Pedals normal pulses. No edema, no varicosities. Abdomen:  Bowel sounds positive; abdomen soft and non-tender without masses, organomegaly, or hernias noted. No hepatosplenomegaly. Extremities:  No clubbing or cyanosis. Neurologic:  Alert and oriented x 3. Psych:  Normal affect.   Impression & Recommendations:  Problem # 1:  CORONARY ATHEROSCLEROSIS NATIVE CORONARY ARTERY (ICD-414.01) Status post DES to the RCA in the setting of NSTEMI.  Patient had residual moderate disease in the LAD.  At last appointment, he reported atypical chest pain episodes.  However, these have completely resolved and he has had no pain  since that time.  He is doing well symptomatically.  Continue ASA, Plavix, metoprolol, simvastatin.   Problem # 2:  HYPERTENSION (ICD-401.9) BP is at goal today.   Problem # 3:  HYPERLIPIDEMIA (ICD-272.4) LDL was at goal when last checked.  HDL is low and triglycerides are high.  He has been talking to Dr. Alwyn Ren about dietary changes to help this profile.  Continue current statin dose.   Problem # 4:  FATIGUE This has been chronic.  He feels tired in the afternoon.  TSH and hemoglobin have been ok.  Metoprolol could be the culprit.  He will ask his wife to watch him while sleeping to see if he stops breathing or gasps at night.  If his sleep habits are worrisome for OSA, would get sleep study.   Patient Instructions: 1)  Your physician wants you to follow-up in: 6 months with Dr Shirlee Latch. Joycie Peek 2012)  You will receive a reminder letter in the mail two months in advance. If you don't receive a letter, please call our office to schedule the follow-up appointment.     Vital Signs:  Patient profile:   65 year old male Height:      75 inches Weight:      218.50 pounds BMI:     27.41 Pulse rate:   62 / minute BP sitting:   134 / 78  (left arm)  Vitals Entered By: Katina Dung, RN, BSN (September 10, 2010 8:42 AM)

## 2010-11-07 NOTE — Progress Notes (Signed)
Summary: Triage: B12 concerns  Phone Note Call from Patient Call back at Home Phone [phone]   Caller: Patient Summary of Call: Patient was here for B12 injection and asked "Can I ever stop getting these B12 injections?" patient stated that his last lab values were fine.  Dr.Hopper please advise Initial call taken by: Shonna Chock,  December 25, 2009 8:39 AM  Follow-up for Phone Call        Hold B12 shots & check B12 level in 4 months (281.0) Follow-up by: Marga Melnick MD,  December 25, 2009 12:43 PM  Additional Follow-up for Phone Call Additional follow up Details #1::        left message to call  office...........Marland KitchenFelecia Deloach CMA  December 25, 2009 2:30 PM  pt return call and left cell # to call back. called pt back no VM set up will try again later...........Marland KitchenFelecia Deloach CMA  December 25, 2009 3:40 PM     Additional Follow-up for Phone Call Additional follow up Details #2::    pt aware will schedule appt when he come in for 2 month appt  ...............................Marland KitchenFelecia Deloach CMA  December 26, 2009 8:49 AM

## 2010-11-07 NOTE — Progress Notes (Signed)
Summary: XRAY RESULTS  Phone Note Call from Patient Call back at Home Phone [phone]   Caller: Spouse Call For: Brian Melnick MD Reason for Call: Lab or Test Results Summary of Call: PATIENT SPOUSE ANN CALLING, THEY ARE VERY ANXIOUS FOR THE CHEST XRAY RESULTS.  PATIENT STILL WITH A VERY BAD COUGH.   Initial call taken by: Magdalen Spatz [facility],  November 30, 2009 2:19 PM  Follow-up for Phone Call        dr lowme pls advise in absent of dr hopper..........Marland KitchenFelecia Deloach CMA  November 30, 2009 2:24 PM   Additional Follow-up for Phone Call Additional follow up Details #1::        hopp already adressed ---see xray Additional Follow-up by: Loreen Freud DO,  November 30, 2009 2:59 PM    Additional Follow-up for Phone Call Additional follow up Details #2::    per Hopp good report; no active process, pt aware....................Marland KitchenFelecia Deloach CMA  November 30, 2009 3:10 PM

## 2010-11-07 NOTE — Progress Notes (Signed)
Summary: wife concerned about pt's chest pains   Phone Note Call from Patient Call back at Home Phone [phone]   Caller: Spouse [phone] anne Reason for Call: Talk to Nurse Summary of Call: pt wife concerned that pt waking up at times with chest pains- couple times this week-woke up this am was also sweating- the pain goes away fairly soon after he gets up and dismisses it as a gas bubble or the way he slept-wife thinks he is over doing it-dr told him to go back to regular activity in moderation but wife says he isn't doing anything in moderation-also would like to know what the echo tomorrow entails and what it shows-she says pt will not tell the dr any of this but she is concerned-pls call Initial call taken by: Glynda Jaeger,  March 28, 2010 9:45 AM     Appended Document: wife concerned about pt's chest pains Echo looked ok.  He had moderate residual disease on cath in his LAD.  Reasonable to get ETT-myoview to assess for ischemia in the LAD distribution. Followup with me afterwards.  Appended Document: wife concerned about pt's chest pains discussed with pt by telephone--pt states he has not had anymore chest pain since wife's phone call last Thursday 03/28/10 -the chest pain he had was not similar to chest pain he had in the past-pt declines to schedule myoview at this time--he will call with any changes in symptoms

## 2010-11-07 NOTE — Progress Notes (Signed)
Summary: no better  Phone Note Call from Patient Call back at Home Phone [phone]   Caller: Patient Summary of Call: pt states rash is gone however pt now c/o uncontrolled coughing that increases with movement and talking. pt still has weakness and fatigue. pt denies any chest congestion.pt would like to get a order for chest x-ray to rule out pneumonia. pls advise............................Marland KitchenFelecia Deloach CMA  November 28, 2009 1:05 PM   Follow-up for Phone Call        CXray @ [facility] (cough); also CBC & dif @ [facility]. Phenergan with codeine 120cc 1 tsp q 6 hrs as needed Follow-up by: Marga Melnick MD,  November 28, 2009 2:47 PM  Additional Follow-up for Phone Call Additional follow up Details #1::        pt aware, rx sent to pharmacy, labs schedule..............Marland KitchenFelecia Deloach CMA  November 28, 2009 3:14 PM   New Problems: COUGH (ICD-786.2)   New Problems: COUGH (ICD-786.2) New/Updated Medications: PROMETHAZINE-CODEINE 6.25-10 MG/5ML SYRP (PROMETHAZINE-CODEINE) Take 1 tsp q 6 hrs as needed Prescriptions: PROMETHAZINE-CODEINE 6.25-10 MG/5ML SYRP (PROMETHAZINE-CODEINE) Take 1 tsp q 6 hrs as needed  #120cc x 0   Entered by:   Jeremy Johann CMA   Authorized by:   Marga Melnick MD   Signed by:   Jeremy Johann CMA on 11/28/2009   Method used:   Printed then faxed to ...       [facility] [id number] (retail)       [address]       [place], [place]  [address]       Ph: [phone]       Fax: [phone]   RxID:   [id number]

## 2010-11-07 NOTE — Assessment & Plan Note (Signed)
Summary: B-12--PH  Medications Added ASPIRIN 325 MG TABS (ASPIRIN) 1 by mouth once daily METOPROLOL TARTRATE 50 MG TABS (METOPROLOL TARTRATE) 1 by mouth  two times a day PLAVIX 75 MG TABS (CLOPIDOGREL BISULFATE) 1 by mouth once daily ZOCOR 40 MG TABS (SIMVASTATIN) 1 by mouth once daily * FISH OIL 1 by mouth once daily       Nurse Visit   Allergies: 1)  Lidocaine  Medication Administration  Injection # 1:    Medication: Vit B12 1000 mcg    Diagnosis: PERNICIOUS ANEMIA (ICD-281.0)    Route: IM    Site: L deltoid    Exp Date: 04/13    Lot #: [id number]    Mfr: American Regent    Patient tolerated injection without complications    Given by: Shonna Chock (Feb 22, 2010 8:58 AM)  Orders Added: 1)  Vit B12 1000 mcg [J3420] 2)  Admin of Therapeutic Inj  intramuscular or subcutaneous [11914]

## 2010-11-07 NOTE — Progress Notes (Signed)
Summary: chest pain //FYI ED  Phone Note Call from Patient Call back at Home Phone [phone]   Caller: Spouse Summary of Call: patient wife said patient having chest pain &pain in both arms  - said he is stubbon & will not go to urgent care or ed - wants to see dr hopper - he said it is just gas   Initial call taken by: Okey [place],  Feb 05, 2010 4:17 PM  Follow-up for Phone Call        Spoke with pt who c/o pain in middle of chest and down both arms says it was real bad"  but eased off now.   --Recommed pt to ED asap stressed importance to be evaluated  to rule out any cardiac issues.  Pt agreed will go to [facility] ED .Kandice Hams  Feb 05, 2010 4:46 PM  Follow-up by: Kandice Hams,  Feb 05, 2010 4:46 PM  Additional Follow-up for Phone Call Additional follow up Details #1::        noted Additional Follow-up by: Marga Melnick MD,  Feb 05, 2010 5:57 PM

## 2010-11-07 NOTE — Assessment & Plan Note (Signed)
Summary: eph/post cath  Medications Added FISH OIL 1000 MG CAPS (OMEGA-3 FATTY ACIDS) one tablet twice a day LISINOPRIL 2.5 MG TABS (LISINOPRIL) one tablet daily      Allergies Added:   Primary Provider:  Dr. Alwyn Ren  CC:  eph/post cath.  Pt feeling well.  Cath site has healed well per patient.  .  History of Present Illness: 65 yo with history of CAD s/p recent NSTEMI presents for hospital followup.  Patient was admitted to [facility] in 5/11 with NSTEMI.  He had a PROMUS DES to the mid RCA.  He had residual moderate disease (60-70%) in the LAD that was not intervened upon.  Since discharge from the hospital he has been doing well with no chest pain.  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    Labs (5/11): creatinine 1.56, TGs 478, HDL 35  Current Medications (verified): 1)  B-12 Injection .... Q Month 2)  Otc K+ .... Prn 3)  Aspirin 325 Mg Tabs (Aspirin) .Marland Kitchen.. 1 By Mouth Once Daily 4)  Metoprolol Tartrate 50 Mg Tabs (Metoprolol Tartrate) .Marland Kitchen.. 1 By Mouth  Two Times A Day 5)  Plavix 75 Mg Tabs (Clopidogrel Bisulfate) .Marland Kitchen.. 1 By Mouth Once Daily 6)  Zocor 40 Mg Tabs (Simvastatin) .Marland Kitchen.. 1 By Mouth Once Daily 7)  Fish Oil .Marland Kitchen.. 1 By Mouth Once Daily  Allergies (verified): 1)  Lidocaine  Past History:  Past Medical History: 1. Hypertension 2. Hyperlipidemia 3. mild anemia, B12 deficiency 4. Prostate cancer, hx of 5. Skin cancer, hx of 6. CKD 7. s/p TKR 8. CAD: NSTEMI 5/11.  LHC showed 60% prox LAD, 70% mid LAD, 95% mid RCA with TIMI 2 flow distally, EF 60%.  Patient had PROMUS DES to [facility].   Family History: mother CAD s/p CABG with onset in 34s, CVA father prostate cancer; bro HTN  Social History: Never Smoked Retired from [employer], works on his farm in [place] Alcohol use-no Regular exercise-yes: physically active Married  Review of Systems       All systems reviewed and  negative except as per HPI.   Vital Signs:  Patient profile:   65 year old male Height:      75 inches Weight:      216 pounds BMI:     27.10 Pulse rate:   68 / minute Pulse rhythm:   regular BP sitting:   120 / 74  (left arm) Cuff size:   regular  Vitals Entered By: Judithe Modest CMA (Mar 01, 2010 2:53 PM)  Physical Exam  General:  Well developed, well nourished, in no acute distress. Head:  normocephalic and atraumatic Nose:  no deformity, discharge, inflammation, or lesions Mouth:  Teeth, gums and palate normal. Oral mucosa normal. Neck:  Neck supple, no JVD. No masses, thyromegaly or abnormal cervical nodes. Lungs:  Clear bilaterally to auscultation and percussion. Heart:  Non-displaced PMI, chest non-tender; regular rate and rhythm, S1, S2 without murmurs, rubs or gallops. Carotid upstroke normal, no bruit.  Pedals normal pulses. No edema, no varicosities. Abdomen:  Bowel sounds positive; abdomen soft and non-tender without masses, organomegaly, or hernias noted. No hepatosplenomegaly. Msk:  Back normal, normal gait. Muscle strength and tone normal. Extremities:  No clubbing or cyanosis. Neurologic:  Alert and oriented x 3. Skin:  Intact without lesions or rashes. Psych:  Normal affect.   Impression & Recommendations:  Problem # 1:  CORONARY ATHEROSCLEROSIS  NATIVE CORONARY ARTERY (ICD-414.01) Status post NSTEMI with DES to RCA.  Doing well, no chest pain.  Continue ASA, Plavix, metoprolol, simvastatin. - VerifyNow P2Y12 testing to make sure that Plavix is providing adequate platelet inhibition.  - Add lisinopril 2.5 mg daily - Echo for LV systolic function post-MI - Needs to start cardiac rehab  Problem # 2:  HYPERLIPIDEMIA (ICD-272.4) Continue simvastatin, increase fish oil to 2000 mg daily.  Lipids/LFTs in 2 months with goal LDL < 70.   Other Orders: Echocardiogram (Echo)  Patient Instructions: 1)  Your physician recommends that you have lab P2Y12-verify  now--you should go to Admitting at [facility] to register then you will go to the Main Lab at [facility] to have the lab done. You have the order for the test. 2)  Your physician has recommended you make the following change in your medication:  3)  Start Lisinopril 2.5mg  daily. 4)  Increase Fish Oil to 2000mg  daily--this will be 1000mg  twice a day. 5)  Your physician has requested that you have an echocardiogram.  Echocardiography is a painless test that uses sound waves to create images of your heart. It provides your doctor with information about the size and shape of your heart and how well your heart's chambers and valves are working.  This procedure takes approximately one hour. There are no restrictions for this procedure. In the next week or so. 6)  Your physician recommends referral and attendance at a Cardiac Rehab Program. 7)  Your physician recommends that you return for a FASTING lipid profile/liver profile in 2 months--272.0 v58.69 8)    9)  Your physician recommends that you schedule a follow-up appointment in: 4 months with Dr Shirlee Latch. Prescriptions: LISINOPRIL 2.5 MG TABS (LISINOPRIL) one tablet daily  #30 x 6   Entered by:   Katina Dung, RN, BSN   Authorized by:   Marca Ancona, MD   Signed by:   Katina Dung, RN, BSN on 03/01/2010   Method used:   [facility]  [address] [id number]       [address]       [place], [place]  [address]       Ph: [phone]       Fax: [phone]   RxID:   [id number]

## 2010-11-07 NOTE — Progress Notes (Signed)
Summary: REFILL  Phone Note Refill Request Call back at [phone] Message from:  Pharmacy on June 03, 2010 11:20 AM  Refills Requested: Medication #1:  HYDROXYZINE HCL 50 MG/ML SOLN 1/2-1 tab by mouth Q6 as needed for itching   Dosage confirmed as above?Dosage Confirmed   Supply Requested: 1 month   Notes: COMES AS 10 MG/5ML PLEASE ADVISE THANKS [facility] [address]  Next Appointment Scheduled: 06/14/10 Initial call taken by: Lavell Islam,  June 03, 2010 11:21 AM  Follow-up for Phone Call        Med RX by Dr.Tabori today will forward for clarification Follow-up by: Shonna Chock CMA,  June 03, 2010 12:11 PM  Additional Follow-up for Phone Call Additional follow up Details #1::        new script sent, please disregard the liquid form previously sent. Additional Follow-up by: Neena Rhymes MD,  June 03, 2010 12:16 PM    Additional Follow-up for Phone Call Additional follow up Details #2::    I called the pharmacy and left message on VM for them to disreguard 1st RX for Hydroxzine soultion./Chrae [facility] CMA  June 03, 2010 12:19 PM   New/Updated Medications: HYDROXYZINE HCL 50 MG TABS (HYDROXYZINE HCL) 1/2-1 tab by mouth Q6 as needed for itching Prescriptions: HYDROXYZINE HCL 50 MG TABS (HYDROXYZINE HCL) 1/2-1 tab by mouth Q6 as needed for itching  #45 x 0   Entered and Authorized by:   Neena Rhymes MD   Signed by:   Neena Rhymes MD on 06/03/2010   Method used:   [facility]        [facility]  [address] [phone]ail[facility]       [address]       [place], [place]  [address]       Ph: [phone]       Fax: [phone]   RxID:   [id number]

## 2010-11-07 NOTE — Assessment & Plan Note (Signed)
Summary: rash//fd   Vital Signs:  Patient profile:   65 year old male Weight:      256 pounds Temp:     98.7 degrees F Pulse rate:   64 / minute Resp:     15 per minute BP sitting:   150 / 90  (left arm) Cuff size:   large  Vitals Entered By: Shonna Chock (November 15, 2009 3:53 PM) CC: Rash x 2 days on back/chest FYI: just getting over a cold Comments REVIEWED MED LIST, PATIENT AGREED DOSE AND INSTRUCTION CORRECT    CC:  Rash x 2 days on back/chest FYI: just getting over a cold.  History of Present Illness: "Flu" 7-10 days ago with "cold": head congestion. Rx: Tamiflu from [facility]  01/31. Rash as of 11/13/2009; no Rx to date.  Allergies: 1)  Lidocaine  Review of Systems General:  Denies chills, fever, and sweats. ENT:  Denies earache, nasal congestion, sinus pressure, and sore throat; No purulence, frontal headache,facial pain . Resp:  Complains of cough and sputum productive; denies shortness of breath and wheezing; Phlegm is clear. Derm:  Complains of itching and rash. Allergy:  Denies itching eyes and sneezing.  Physical Exam  General:  well-nourished,in no acute distress; alert,appropriate and cooperative throughout examination Ears:  External ear exam shows no significant lesions or deformities.  Otoscopic examination reveals clear canals, tympanic membranes are intact bilaterally without bulging, retraction, inflammation or discharge. Hearing is grossly normal bilaterally. Nose:  External nasal examination shows no deformity or inflammation. Nasal mucosa are  dry without lesions or exudates. Mouth:  Oral mucosa and oropharynx without lesions or exudates.  Teeth in good repair. Lungs:  Normal respiratory effort, chest expands symmetrically. Lungs are clear to auscultation, no crackles or wheezes but rattly cough. Skin:  Scattered tiny red papules which blanch. marked Dermatographia Cervical Nodes:  No lymphadenopathy noted Axillary Nodes:  No palpable  lymphadenopathy   Impression & Recommendations:  Problem # 1:  RASH-NONVESICULAR (ICD-782.1)  Orders: Prescription Created Electronically [phone])  Complete Medication List: 1)  B-12 Injection  .... Q month 2)  Otc K+  .... Prn 3)  Asa 81mg   .... Qd 4)  Metoprolol Tartrate 25 Mg Tabs (Metoprolol tartrate) .Marland Kitchen.. 1 by mouth bid 5)  Hydroxyzine Pamoate 25 Mg Caps (Hydroxyzine pamoate) .Marland Kitchen.. 1-2  q 6 hrs as needed itching 6)  Prednisone 20 Mg Tabs (Prednisone) .Marland Kitchen.. 1 two times a day with food x 5 days then 1 once daily  Patient Instructions: 1)  Check your Blood Pressure regularly. Your goal = AVERAGE < 135/85. Avoid hyperallgenic foods (shellfish, choc, nuts , strawberries, tomatoes) until rash gone Prescriptions: PREDNISONE 20 MG TABS (PREDNISONE) 1 two times a day with food X 5 days then 1 once daily  #15 x 0   Entered and Authorized by:   Marga Melnick MD   Signed by:   Marga Melnick MD on 11/15/2009   Method used:   [facility]ed to ...       [facility]  [address] [id number]       [address]       [place], [place]  [address]       Ph: [phone]       Fax: [phone]   RxID:   [id number] HYDROXYZINE PAMOATE 25 MG CAPS (HYDROXYZINE PAMOATE) 1-2  q 6 hrs as needed itching  #30 x 0   Entered and Authorized by:   Chrissie Noa  Tor Tsuda MD   Signed by:   Marga Melnick MD on 11/15/2009   Method used:   [facility]ed to ...       [facility]  [address] [id number]       [address]       [place], [place]  [address]       Ph: [phone]       Fax: [phone]   RxID:   [id number]

## 2010-11-07 NOTE — Assessment & Plan Note (Signed)
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   Medication Administration  Injection # 1:    Medication: Vit B12 1000 mcg    Diagnosis: PERNICIOUS ANEMIA (ICD-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

## 2010-11-07 NOTE — Assessment & Plan Note (Signed)
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

## 2010-11-07 NOTE — Progress Notes (Signed)
Summary: Appointment due  Phone Note Outgoing Call Call back at [facility] Phone [phone]   Call placed by: Shonna Chock CMA,  October 25, 2010 8:19 AM Call placed to: Patient Summary of Call: Spoke with patient: Per Dr.Hopper patient to get 2012 durg coverage and schedule OV to discuss labs  Patient indicated that he will contact his insuranse company and call to schedule appointment once he hsa coverage list./Chrae [facility] CMA  October 25, 2010 8:24 AM

## 2010-11-07 NOTE — Miscellaneous (Signed)
Summary: [facility] Progress Note   [facility] Progress Note   Imported By: Roderic Ovens 04/30/2010 15:48:01  _____________________________________________________________________  External Attachment:    Type:   Image     Comment:   External Document

## 2010-11-07 NOTE — Miscellaneous (Signed)
Summary: Orders Update  Clinical Lists Changes  Orders: Added new Test order of TLB-CBC Platelet - w/Differential (85025-CBCD) - Signed  Appended Document: Orders Update 04/15/10--0830a---pt here to have lab work and stated large bruise on right leg--calf area--leg looked at by dr Shirlee Latch and pt advised bruising due to plavix and would heal on it's own--good pedal pulse present--nt

## 2010-11-07 NOTE — Assessment & Plan Note (Signed)
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

## 2010-11-07 NOTE — Assessment & Plan Note (Signed)
Summary: lipid:272.4/cbs, b-12 shot///sph--will be here at 8:00am////sph   Nurse Visit  CC: B-12 inj and labs./kb   Allergies: 1)  ! 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

## 2010-11-07 NOTE — Assessment & Plan Note (Signed)
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   Medication Administration  Injection # 1:    Medication: Vit B12 1000 mcg    Diagnosis: PERNICIOUS ANEMIA (ICD-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

## 2010-11-07 NOTE — Assessment & Plan Note (Signed)
Summary: cough and cold for 3 weeks///sph   Vital Signs:  Patient profile:   65 year old male Weight:      221 pounds BMI:     27.72 Temp:     98.4 degrees F oral BP sitting:   122 / 60  (left arm)  Vitals Entered By: Doristine Devoid CMA (November 01, 2010 2:13 PM) CC: cough x4 weeks has tried mucinex dm w/o improvement started getting worse    History of Present Illness: 65 yo man here today for cough.  sxs started 4 weeks ago.  progressively worsening.  no fevers.  no N/V.  has used mucinex DM w/out relief.  'my sinuses are all stopped up- and it's in my chest now'.  no ear pain.  cough is more 'dry, hacking'.  no recent sick contacts.  'i feel pretty well otherwise'.  worse w/ cold air or prolonged talking.  no facial pain or pressure.  no GERD sxs.  no hx of asthma.  has had some wheezing.  Current Medications (verified): 1)  B-12 Injection .... Q Month 2)  Otc K+ .... Daily 3)  Aspirin 325 Mg Tabs (Aspirin) .Marland Kitchen.. 1 By Mouth Once Daily 4)  Metoprolol Tartrate 50 Mg Tabs (Metoprolol Tartrate) .Marland Kitchen.. 1 By Mouth  Two Times A Day 5)  Plavix 75 Mg Tabs (Clopidogrel Bisulfate) .Marland Kitchen.. 1 By Mouth Once Daily 6)  Zocor 40 Mg Tabs (Simvastatin) .Marland Kitchen.. 1 By Mouth Once Daily 7)  Fish Oil 1000 Mg Caps (Omega-3 Fatty Acids) .... One Tablet Twice A Day  Allergies (verified): 1)  ! * Hydroxyzine 2)  Lidocaine  Past History:  Past medical, surgical, family and social histories (including risk factors) reviewed for relevance to current acute and chronic problems.  Past Medical History: Reviewed history from 09/10/2010 and no changes required. 1. Hypertension: Did not tolerate lisinopril even at low dose.  2. Hyperlipidemia 3. Mild anemia, B12 deficiency,PMH of 4. Prostate cancer, hx of 5. Skin cancer, PMH  of 6. S/P  TKR 7. CAD: NSTEMI 5/11.  LHC showed 60% prox LAD, 70% mid LAD, 95% mid RCA with TIMI 2 flow distally, EF 60%.  Patient had [facility]OM[facility] DES to [facility].  8. Renal insufficiency, creatinine 1.5   in 05/2010  Past Surgical History: Reviewed history from 06/14/2010 and no changes required. Cholecystectomy Prostatectomy , Dr Vernie Ammons Arthroscopy R knee Colonoscopy negative 2006, Dr Jarold Motto Tonsillectomy mRCA Stent 02/2010  Family History: Reviewed history from 09/09/2010 and no changes required. mother :CAD S/P  CABG  in 9s, CVA father: prostate cancer; bro: HTN  Social History: Reviewed history from 06/14/2010 and no changes required. Never Smoked Retired from 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 no TTP over sinuses Eyes:  no injxn or inflammation Ears:  External ear exam shows no significant lesions or deformities.  Otoscopic examination reveals clear canals, tympanic membranes are intact bilaterally without bulging, retraction, inflammation or discharge. Hearing is grossly normal bilaterally. Nose:  External nasal examination shows no deformity or inflammation. Nasal mucosa are pink and moist without lesions or exudates. Mouth:  Oral mucosa and oropharynx without lesions or exudates.  Teeth in good repair. Neck:  No deformities, masses, or tenderness noted. Lungs:  Normal respiratory effort, chest expands symmetrically. Lungs  are clear to auscultation, no crackles or wheezes.  dry cough Heart:  regular rhythm, no murmur   Impression & Recommendations:  Problem # 1:  REACTIVE AIRWAY DISEASE (ICD-493.90) Assessment New pt likely has upper airway inflammation s/p URI.  start Qvar 40mg  two times a day to decrease inflammation and use Proventil Q4 for cough.  explained cause of cough to pt- he expressed understanding.  Complete Medication List: 1)  B-12 Injection  .... Q month 2)  Otc K+   .... Daily 3)  Aspirin 325 Mg Tabs (Aspirin) .Marland Kitchen.. 1 by mouth once daily 4)  Metoprolol Tartrate 50 Mg Tabs (Metoprolol tartrate) .Marland Kitchen.. 1 by mouth  two times a day 5)  Plavix 75 Mg Tabs (Clopidogrel bisulfate) .Marland Kitchen.. 1 by mouth once daily 6)  Zocor 40 Mg Tabs (Simvastatin) .Marland Kitchen.. 1 by mouth once daily 7)  Fish Oil 1000 Mg Caps (Omega-3 fatty acids) .... One tablet twice a day  Patient Instructions: 1)  You have reactive airway disease which can follow an upper respiratory infection 2)  Take the Qvar 2 puffs two times a day x2 weeks 3)  Use the Proventil 2 puffs every 4 hours for the next few days to decrease cough 4)  Call with any questions or concerns 5)  Hang in there!   Orders Added: 1)  Est. Patient Level III [98119]

## 2010-11-07 NOTE — Assessment & Plan Note (Signed)
Summary: itching / reaction to med?/cbs   Vital Signs:  Patient profile:   65 year old male Weight:      218 pounds Pulse rate:   72 / minute BP sitting:   120 / 78  (left arm)  Vitals Entered By: Doristine Devoid CMA (June 03, 2010 10:24 AM) CC: itching x4 days heat and sweating makes worse no change in medication   History of Present Illness: 65 yo man here today for itching.  sxs started 4 days ago.  sxs initially started in gluteal fold.  now arms, back, inner legs along knees.  sxs improved this AM but prevented him from sleeping last night.  has not traveled or slept anywhere new or different.  denies sxs at feet or ankles, nothing on hands or finger webbing.  itching is worse w/ heat or sweat.  no bites or contact w/ animals.  no change in laundry detergent, soaps, etc.  pt reports while outside he starts itching wherever his clothing touches.  some relief w/ benadryl.  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 Hypertension 2. Hyperlipidemia 3. mild anemia, B12 deficiency 4. Prostate cancer, hx of 5. Skin cancer, hx of 6. CKD 7. s/p TKR 8. CAD: NSTEMI 5/11.  LHC showed 60% prox LAD, 70% mid LAD, 95% mid RCA with TIMI 2 flow distally, EF 60%.  Patient had PROMUS DES to [facility].   Social History: Last updated:  03/01/2010 Never Smoked Retired from [employer], works on his farm in [place] Alcohol use-no Regular exercise-yes: physically active Married  Review of Systems      See HPI  Physical Exam  General:  well-nourished,in no acute distress; alert,appropriate and cooperative throughout examination Skin:  no obvious rash, no lesions.  + excoriations on L upper arm and back   Impression & Recommendations:  Problem # 1:  PRURITUS (ICD-698.9) Assessment New initially involved areas covered by clothing.  pt denies change in soaps or detergents.  areas involved migrate.  no lesions visible.  check LFTs, Cr to r/o systemic causes of pruritis.  start hydrocortisone cream and hydroxyzine as needed.  reviewed supportive care and red flags that should prompt return.  Pt expresses understanding and is in agreement w/ this plan. Orders: Venipuncture (30865) TLB-Hepatic/Liver Function Pnl (80076-HEPATIC) TLB-BMP (Basic Metabolic Panel-BMET) (80048-METABOL) Prescription Created Electronically [phone])  Complete Medication List: 1)  B-12 Injection  .... Q month 2)  Otc K+  .... Prn 3)  Aspirin 325 Mg Tabs (Aspirin) .Marland Kitchen.. 1 by mouth once daily 4)  Metoprolol Tartrate 50 Mg Tabs (Metoprolol tartrate) .Marland Kitchen.. 1 by mouth  two times a day 5)  Plavix 75 Mg Tabs (Clopidogrel bisulfate) .Marland Kitchen.. 1 by mouth once daily 6)  Zocor 40 Mg Tabs (Simvastatin) .Marland Kitchen.. 1 by mouth once daily 7)  Fish Oil 1000 Mg Caps (Omega-3 fatty acids) .Marland KitchenMarland KitchenMarland Kitchen  One tablet twice a day 8)  Hydroxyzine Hcl 50 Mg/ml Soln (Hydroxyzine hcl) .... 1/2-1 tab by mouth q6 as needed for itching 9)  Hydrocortisone 2.5 % Crea (Hydrocortisone) .... Apply to affected areas two times a day.  disp 1 large tube  Patient Instructions: 1)  Follow up w/ Dr Alwyn Ren as scheduled 2)  We'll notify you of your lab results 3)  Use the Hydrocortisone cream as needed for itching 4)  Take the Hydroxyzine as needed for itching- may cause drowsiness 5)  Wash  whatever you were wearing to do your yard work before you come back in contact with whatever is causing your itching 6)  Call if no improvement 7)  Hang in there! Prescriptions: HYDROCORTISONE 2.5 % CREA (HYDROCORTISONE) apply to affected areas two times a day.  disp 1 large tube  #1 x 1   Entered and Authorized by:   Neena Rhymes MD   Signed by:   Neena Rhymes MD on 06/03/2010   Method used:   [facility]  [address] [phone]* (retail)       [address]       [place], [place]  [address]       Ph: [phone]       Fax: [phone]   RxID:   [id number] HYDROXYZINE HCL 50 MG/ML SOLN (HYDROXYZINE HCL) 1/2-1 tab by mouth Q6 as needed for itching  #45 x 0   Entered and Authorized by:   Neena Rhymes MD   Signed by:   Neena Rhymes MD on 06/03/2010   Method used:   [facility]        [facility]  [address] [phone]* (retail)       [address]       [place], [place]  [address]       Ph: [phone]       Fax: [phone]   RxID:   [id number]

## 2010-11-07 NOTE — Assessment & Plan Note (Signed)
Summary: b-12/cbs   Nurse Visit   Allergies: 1)  Lidocaine  Medication Administration  Injection # 1:    Medication: Vit B12 1000 mcg    Diagnosis: PERNICIOUS ANEMIA (ICD-281.0)    Route: IM    Site: R deltoid    Exp Date: 12/2011    Lot #: [id number]    Mfr: American Regent    Patient tolerated injection without complications    Given by: Shonna Chock CMA (May 17, 2010 8:42 AM)  Orders Added: 1)  Vit B12 1000 mcg [J3420] 2)  Admin of Therapeutic Inj  intramuscular or subcutaneous [84696]

## 2010-11-07 NOTE — Progress Notes (Signed)
Summary: talk to nurse   Phone Note Call from Patient Call back at Home Phone [phone]   Caller: Patient Reason for Call: Talk to Nurse Summary of Call: pt needs to discuss his lisinoprol. headache,upset stomache, no energy,cough  cell [phone] Initial call taken by: Edman Circle,  March 20, 2010 1:08 PM  Follow-up for Phone Call        talked with patient by telephone--dry cough,no energy,bloating since starting Lisinopril--will review with Dr Shirlee Latch     Appended Document: talk to nurse He can stop lisinopril  Appended Document: talk to nurse talked with patient by telephone-he will stop Lisinopril   Clinical Lists Changes  Medications: Removed medication of LISINOPRIL 2.5 MG TABS (LISINOPRIL) one tablet daily

## 2010-11-07 NOTE — Assessment & Plan Note (Signed)
Summary: B-12 SHOT///SPH   Nurse Visit   Allergies: 1)  Lidocaine  Medication Administration  Injection # 1:    Medication: Vit B12 1000 mcg    Diagnosis: PERNICIOUS ANEMIA (ICD-281.0)    Route: IM    Site: R deltoid    Exp Date: 05/2011    Lot #: [id number]    Mfr: American Regent    Patient tolerated injection without complications    Given by: Shonna Chock (December 25, 2009 8:37 AM)  Orders Added: 1)  Vit B12 1000 mcg [J3420] 2)  Admin of Therapeutic Inj  intramuscular or subcutaneous [[address]]

## 2010-11-07 NOTE — Miscellaneous (Signed)
Summary: [facility] Phys[facility] Order/Treatment Plan   [facility] Physician Order/Treatment Plan   Imported By: Roderic Ovens 03/25/2010 15:53:15  _____________________________________________________________________  External Attachment:    Type:   Image     Comment:   External Document

## 2010-11-07 NOTE — Assessment & Plan Note (Signed)
Summary: b-12/cbs   Nurse Visit  CC: B-12 inj./kb   Allergies: 1)  ! * Hydroxyzine 2)  Lidocaine  Medication Administration  Injection # 1:    Medication: Vit B12 1000 mcg    Diagnosis: PERNICIOUS ANEMIA (ICD-281.0)    Route: IM    Site: R deltoid    Exp Date: 12/05/2011    Lot #: [id number]    Mfr: American Regent    Patient tolerated injection without complications    Given by: Lucious Groves CMA (August 13, 2010 9:31 AM)  Orders Added: 1)  Vit B12 1000 mcg [J3420] 2)  Admin of Therapeutic Inj  intramuscular or subcutaneous [16109]

## 2010-11-07 NOTE — Assessment & Plan Note (Signed)
Summary: med refill /b12/cbs   Vital Signs:  Patient profile:   65 year old male Height:      75 inches Weight:      218 pounds Temp:     98.2 degrees F oral Pulse rate:   72 / minute Resp:     18 per minute BP sitting:   118 / 76  (left arm)  Vitals Entered By: Jeremy Johann CMA (June 14, 2010 1:21 PM) CC: med refills, b12    Primary Care Provider:  Dr. Alwyn Ren  CC:  med refills and b12 .  History of Present Illness: Here for Medicare AWV: 1.Risk factors based on Past M, S, F history:B12 deficiency;HTN;Dyslipidemia, creatinine 1.6 ( chart updated) 2.Physical Activities:physically active on farm 3.Depression/mood: no issues 4.Hearing: whisper heard @ 6 ft 5.ADL's: no limitations 6.Fall Risk: none 7.Home Safety: denied 8.Height, weight, &visual acuity:wall chart read @ 6 ft w/o lenses 9.Counseling: none rquested ; POA /Living Will not in place 10.Labs ordered based on risk factors: see Orders 11. Referral Coordination: none requested 12.Care Plan: see Instructions 13. Cognitive Assessment: Oriented X 3; memory & recall intact  ; "WORLD " spelled backwards; mood & affect normal. Hypertension Follow-Up      This is a 65 year old man who presents for Hypertension follow-up.  The patient reports fatigue, but denies lightheadedness, urinary frequency, headaches, and edema.  Associated symptoms include dyspnea on exertion with incline.  The patient denies the following associated symptoms: chest pain, chest pressure, exercise intolerance, palpitations, and syncope.  Compliance with medications (by patient report) has been near 100%.  The patient reports exercising daily.  Adjunctive measures currently used by the patient include salt restriction. BP @ home 120+/ 74.  Hyperlipidemia Follow-Up      The patient also presents for Hyperlipidemia follow-up.  The patient denies muscle aches, GI upset, abdominal pain, flushing, itching, constipation, and diarrhea.  Compliance with  medications (by patient report) has been near 100%.  Adjunctive measures currently used by the patient include ASA and fish oil supplements.  Lipids reviewed ; LDL @ goal(68.5)  but Triglycerides elevated (284) on 05/06/2010.  Preventive Screening-Counseling & Management  Alcohol-Tobacco     Alcohol drinks/day: 0     Smoking Status: never  Caffeine-Diet-Exercise     Caffeine use/day: none     Diet Comments: low salt  Hep-HIV-STD-Contraception     Dental Visit-last 6 months yes     Sun Exposure-Excessive: no  Safety-Violence-Falls     Seat Belt Use: yes     Firearms in the Home: firearms in the home     Firearm Counseling: not indicated; uses recommended firearm safety measures     Smoke Detectors: yes      Sexual History:  currently monogamous.        Blood Transfusions:  no.        Travel History:  no recent  travel.    Current Medications (verified): 1)  B-12 Injection .... Q Month 2)  Otc K+ .... Prn 3)  Aspirin 325 Mg Tabs (Aspirin) .Marland Kitchen.. 1 By Mouth Once Daily 4)  Metoprolol Tartrate 50 Mg Tabs (Metoprolol Tartrate) .Marland Kitchen.. 1 By Mouth  Two Times A Day 5)  Plavix 75 Mg Tabs (Clopidogrel Bisulfate) .Marland Kitchen.. 1 By Mouth Once Daily 6)  Zocor 40 Mg Tabs (Simvastatin) .Marland Kitchen.. 1 By Mouth Once Daily 7)  Fish Oil 1000 Mg Caps (Omega-3 Fatty Acids) .... One Tablet Twice A Day  Allergies (verified): 1)  ! *  Hydroxyzine 2)  Lidocaine  Past History:  Past Medical History: 1. Hypertension 2. Hyperlipidemia 3. mild anemia, B12 deficiency,PMH of 4. Prostate cancer, hx of 5. Skin cancer, PMH  of 6. S/P  TKR 7. CAD: NSTEMI 5/11.  LHC showed 60% prox LAD, 70% mid LAD, 95% mid RCA with TIMI 2 flow distally, EF 60%.  Patient had PROMUS DES to [facility].   8.Renal insufficiency, creatinine 1.6  in 05/2010  Past Surgical History: Cholecystectomy Prostatectomy , Dr Vernie Ammons Arthroscopy R knee Colonoscopy negative 2006, Dr Jarold Motto Tonsillectomy mRCA Stent 02/2010  Family History: mother :CAD  S/P  CABG  in 20s, CVA father: prostate cancer; bro: HTN  Social History: Never Smoked Retired from 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         Weight up 6# with meds. Dr Vernie Ammons seen annually.  Physical Exam  General:  well-nourished; alert,appropriate and cooperative throughout examination Head:  Normocephalic and atraumatic without obvious abnormalities. Pattern alopecia  Eyes:  No corneal or conjunctival inflammation noted.Perrla. Funduscopic exam benign, without hemorrhages, exudates or papilledema.  Ears:  External ear exam shows no significant lesions or deformities.  Otoscopic examination reveals clear canals, tympanic membranes are intact bilaterally without bulging, retraction, inflammation or discharge. Hearing is grossly normal bilaterally. Nose:  External nasal examination shows no deformity or inflammation. Nasal mucosa are pink and moist without lesions or exudates. Mouth:  Oral mucosa and oropharynx without lesions or exudates.  Teeth in good repair. Neck:  No deformities, masses, or tenderness noted. Lungs:  Normal respiratory effort, chest expands symmetrically. Lungs are clear to auscultation, no crackles or wheezes. Heart:  regular rhythm, no murmur, no gallop, no rub, no JVD, no HJR, and bradycardia.  S4 Abdomen:  Bowel sounds positive,abdomen soft and non-tender without masses, organomegaly or hernias noted. Genitalia:  Dr Vernie Ammons Msk:  No deformity or scoliosis noted of thoracic or  lumbar spine.   Pulses:  R and L carotid,radial,dorsalis pedis and posterior tibial pulses are full and equal bilaterally Extremities:  No clubbing, cyanosis, edema, or deformity noted with normal full range of motion of all joints.   Neurologic:  alert & oriented X3 and DTRs symmetrical and normal.   Skin:  8X 5 mm irregular nevus L chest Cervical Nodes:  No lymphadenopathy noted Axillary Nodes:  No palpable lymphadenopathy Psych:  memory intact for recent and remote, normally interactive, and good eye contact.     Impression & Recommendations:  Problem # 1:  PREVENTIVE HEALTH CARE (ICD-V70.0)  Orders: [facility] -Subsequent Annual Wellness Visit [phone])  Problem # 2:  HYPERLIPIDEMIA (ICD-272.4) Triglycerides not @ goal His updated medication list for this problem includes:    Zocor 40 Mg Tabs (Simvastatin) .Marland Kitchen... 1 by mouth once daily  Problem # 3:  HYPERTENSION (ICD-401.9)  Controlled His updated medication list for this problem includes:    Metoprolol Tartrate 50 Mg Tabs (Metoprolol tartrate) .Marland Kitchen... 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 1 by mouth once daily    Metoprolol Tartrate 50 Mg Tabs (Metoprolol tartrate) .Marland Kitchen... 1 by mouth  two times a day    Plavix 75 Mg Tabs (Clopidogrel bisulfate) .Marland Kitchen... 1 by mouth once daily  Orders: EKG w/ Interpretation (93000)  Problem # 6:  RENAL INSUFFICIENCY (ICD-588.9) Creatinine improved  Complete Medication List: 1)  B-12 Injection  .... Q month 2)  Otc K+  .... Prn 3)  Aspirin 325 Mg Tabs (Aspirin) .Marland Kitchen.. 1 by mouth once daily 4)  Metoprolol Tartrate 50 Mg Tabs (Metoprolol tartrate) .Marland Kitchen.. 1 by mouth  two  times a day 5)  Plavix 75 Mg Tabs (Clopidogrel bisulfate) .Marland Kitchen.. 1 by mouth once daily 6)  Zocor 40 Mg Tabs (Simvastatin) .Marland Kitchen.. 1 by mouth once daily 7)  Fish Oil 1000 Mg Caps (Omega-3 fatty acids) .... One tablet twice a day  Other Orders: Vit B12 1000 mcg (J3420) Admin of Therapeutic Inj  intramuscular or subcutaneous (78295) Tdap => 22yrs IM (62130) Admin 1st Vaccine (86578)  Patient Instructions: 1)  Read all food & drink labels . Consume LESS THAN  40 grams of sugar/ day from those with High Fructose Corn Syrup as # 2, 3 or 4 on label. 2)  Please schedule a follow-up fasting  appointment in 4 months for a  3)  Lipid Panel (272.4): 4)  Choose your Health care Power of Attorney and/or prepare a Living Will.                                                                                             Keep a diary of chest pain & shortness of breath episodes & share with Dr Shirlee Latch  @ appt next week.   Immunizations Administered:  Tetanus Vaccine:    Vaccine Type: Tdap    Site: left deltoid    Mfr: GlaxoSmithKline    Dose: 0.5 ml    Route: IM    Given by: Shonna Chock CMA    Exp. Date: 06/26/2012    Lot #: [id number]    VIS given: 08/23/08 version given June 14, 2010.    Medication Administration  Injection # 1:    Medication: Vit B12 1000 mcg    Diagnosis: PERNICIOUS ANEMIA (ICD-281.0)    Route: IM    Site: R deltoid    Exp Date: 12/2011    Lot #: [id number]    Mfr: American Regent    Patient tolerated injection without complications    Given by: Shonna Chock CMA (June 14, 2010 1:30 PM)  Orders Added: 1)  Vit B12 1000 mcg [J3420] 2)  Admin of Therapeutic Inj  intramuscular or subcutaneous [96372] 3)  Tdap => 48yrs IM [90715] 4)  Admin 1st Vaccine [90471] 5)  Est. Patient Level III [13244] 6)  [facility] -Subsequent Annual Wellness Visit [G0439] 7)  EKG w/ Interpretation [93000] 8)  Dermatology Referral [Derma]

## 2010-11-13 NOTE — Assessment & Plan Note (Signed)
Summary: followup per pt/kn   Vital Signs:  Patient profile:   65 year old male Weight:      221.8 pounds BMI:     27.82 Temp:     98.8 degrees F oral Pulse rate:   64 / minute Resp:     14 per minute BP sitting:   132 / 80  (left arm) Cuff size:   large  Vitals Entered By: Shonna Chock CMA (November 04, 2010 10:27 AM) CC: 1.) Follow-up visit: discuss formulary/meds   2.) Cough, seen Friday by Dr.Tabori, given samples of inhalers and 1 made patient feel dizzy and he d/c'ed   Primary Care Provider:  Dr. Alwyn Ren  CC:  1.) Follow-up visit: discuss formulary/meds   2.) Cough, seen Friday by Dr.Tabori, and given samples of inhalers and 1 made patient feel dizzy and he d/c'ed.  History of Present Illness: Hyperlipidemia Follow-Up      This is a 65 year old man who presents for Hyperlipidemia follow-up.  The patient reports fatigue, but denies muscle aches, GI upset, abdominal pain, flushing, itching, constipation, and diarrhea.  Other symptoms include dypsnea.  The patient denies the following symptoms: chest pain/pressure, exercise intolerance, palpitations, syncope, and pedal edema.  Compliance with medications (by patient report) has been near 100%.  Dietary compliance has been good.  The patient reports no exercise.  Adjunctive measures currently used by the patient include fiber, ASA, and fish oil supplements.  Labs reviewed & risks discussed.  Cough      The patient also presents with Cough X 1 month. Initially he had URI symptoms. The  patient  now reports non-productive cough and malaise, but denies pleuritic chest pain, significant  wheezing, fever, and hemoptysis.  The patient  now denies the following symptoms: cold/URI symptoms, sore throat, nasal congestion, chronic rhinitis, and acid reflux symptoms.  The cough is worse with cold exposure.  There are NO  history of asthma , smoking regularly  or  of reflux.  Rx: OTC drops; Vit C.  OV 01/27 reviewed; Albuterol MDI not used;  lightheadedness ? from Qvar X 2 .  Allergies: 1)  ! * Hydroxyzine 2)  Lidocaine  Past History:  Past Medical History: 1. Hypertension: Did not tolerate lisinopril even at low dose.  2. Hyperlipidemia 3. Mild anemia, B12 deficiency,PMH of 4. Prostate cancer, hx of 5. Skin cancer, PMH  of 1987; Melanoma L chest 2012 6. S/P  TKR 7. CAD: NSTEMI 5/11.  LHC showed 60% prox LAD, 70% mid LAD, 95% mid RCA with TIMI 2 flow distally, EF 60%.  Patient had PROMUS DES to [facility].  8. Renal insufficiency, creatinine 1.5  in 05/2010  Physical Exam  General:  well-nourished,in no acute distress; alert,appropriate and cooperative throughout examination Ears:  External ear exam shows no significant lesions or deformities.  Otoscopic examination reveals clear canals, tympanic membranes are intact bilaterally without bulging, retraction, inflammation or discharge. Hearing is grossly normal bilaterally. Nose:  External nasal examination shows no deformity or inflammation. Nasal mucosa are pink and moist without lesions or exudates. Mouth:  Oral mucosa and oropharynx without lesions or exudates.  Teeth in good repair. Lungs:  Normal respiratory effort, chest expands symmetrically. Lungs are clear to auscultation, no crackles  & NO  wheezes. Heart:  regular rhythm, no murmur, no gallop, no rub, no JVD, and bradycardia.   Pulses:  R and L carotid,radial,dorsalis pedis and posterior tibial pulses are full and equal bilaterally Extremities:  No clubbing, cyanosis, edema. Cervical  Nodes:  No lymphadenopathy noted Axillary Nodes:  No palpable lymphadenopathy   Impression & Recommendations:  Problem # 1:  HYPERLIPIDEMIA (ICD-272.4) Triglycerides excessively high His updated medication list for this problem includes:    Zocor 40 Mg Tabs (Simvastatin) .Marland Kitchen... 1 by mouth once daily  Problem # 2:  BRONCHITIS-ACUTE (ICD-466.0)  His updated medication list for this problem includes:    Azithromycin 250 Mg Tabs  (Azithromycin) .Marland Kitchen... As per pack    Hydromet 5-1.5 Mg/66ml Syrp (Hydrocodone-homatropine) .Marland Kitchen... 1 tsp every 6 hrs as needed for cough  Orders: Prescription Created Electronically [phone])  Complete Medication List: 1)  B-12 Injection  .... Q month 2)  Otc K+  .... Daily 3)  Aspirin 325 Mg Tabs (Aspirin) .Marland Kitchen.. 1 by mouth once daily 4)  Metoprolol Tartrate 50 Mg Tabs (Metoprolol tartrate) .Marland Kitchen.. 1 by mouth  two times a day 5)  Plavix 75 Mg Tabs (Clopidogrel bisulfate) .Marland Kitchen.. 1 by mouth once daily 6)  Zocor 40 Mg Tabs (Simvastatin) .Marland Kitchen.. 1 by mouth once daily 7)  Fish Oil 1000 Mg Caps (Omega-3 fatty acids) .... One tablet twice a day 8)  Prednisone 20 Mg Tabs (Prednisone) .Marland Kitchen.. 1 two times a day with food until cough gone 9)  Azithromycin 250 Mg Tabs (Azithromycin) .... As per pack 10)  Hydromet 5-1.5 Mg/55ml Syrp (Hydrocodone-homatropine) .Marland Kitchen.. 1 tsp every 6 hrs as needed for cough  Patient Instructions: 1)  Consume LESS THAN 40 grams of HFCS sugar/ day. 2)  Please schedule a follow-up  fasting lab appointment in 4 months for Lipid Panel , ICD-9:272.4. Fill cough syrup if sleep disturbed.  3)  Drink as much NON dairy  fluid as you can tolerate for the next few days. Singulair 10 mg # 7, 1 daily (given as samples) Prescriptions: HYDROMET 5-1.5 MG/5ML SYRP (HYDROCODONE-HOMATROPINE) 1 tsp every 6 hrs as needed for cough  #120 cc x 0   Entered and Authorized by:   Marga Melnick MD   Signed by:   Marga Melnick MD on 11/04/2010   Method used:   Print then Give to Patient   RxID:   [id number] AZITHROMYCIN 250 MG TABS (AZITHROMYCIN) as per pack  #1 x 0   Entered and Authorized by:   Marga Melnick MD   Signed by:   Marga Melnick MD on 11/04/2010   Method used:   [facility]  [address] [id number]       [address]       [place], [place]  [address]       Ph: [phone]       Fax: [phone]   RxID:   [id number] PREDNISONE 20 MG TABS  (PREDNISONE) 1 two times a day with food until cough gone  #14 x 0   Entered and Authorized by:   Marga Melnick MD   Signed by:   Marga Melnick MD on 11/04/2010   Method used:   [facility]        [facility]  [address] [id number]       [address]       [place], [place]  [address]       Ph: [phone]       Fax: [phone]   RxID:   [id number]    Orders Added: 1)  Est. Patient Level IV [51761] 2)  Prescription Created Electronically [phone]

## 2010-11-14 ENCOUNTER — Ambulatory Visit (INDEPENDENT_AMBULATORY_CARE_PROVIDER_SITE_OTHER): Payer: Medicare Other

## 2010-11-14 ENCOUNTER — Encounter: Payer: Self-pay | Admitting: Internal Medicine

## 2010-11-14 DIAGNOSIS — D510 Vitamin B12 deficiency anemia due to intrinsic factor deficiency: Secondary | ICD-10-CM

## 2010-11-21 NOTE — Assessment & Plan Note (Signed)
Summary: b12 inj/lch   Nurse Visit   Allergies: 1)  ! * Hydroxyzine 2)  Lidocaine  Medication Administration  Injection # 1:    Medication: Vit B12 1000 mcg    Diagnosis: PERNICIOUS ANEMIA (ICD-281.0)    Route: IM    Site: R deltoid    Exp Date: 12/05/2011    Lot #: [id number]    Mfr: American Regent    Patient tolerated injection without complications    Given by: Jeremy Johann CMA (November 14, 2010 9:38 AM)  Orders Added: 1)  Vit B12 1000 mcg [J3420] 2)  Admin of Therapeutic Inj  intramuscular or subcutaneous [56213]

## 2010-12-13 ENCOUNTER — Encounter: Payer: Self-pay | Admitting: Internal Medicine

## 2010-12-13 ENCOUNTER — Ambulatory Visit (INDEPENDENT_AMBULATORY_CARE_PROVIDER_SITE_OTHER): Payer: Medicare Other

## 2010-12-13 DIAGNOSIS — D510 Vitamin B12 deficiency anemia due to intrinsic factor deficiency: Secondary | ICD-10-CM

## 2010-12-17 ENCOUNTER — Encounter: Payer: Self-pay | Admitting: Internal Medicine

## 2010-12-17 NOTE — Assessment & Plan Note (Signed)
Summary: b12/cbs   Nurse Visit   Allergies: 1)  ! * Hydroxyzine 2)  Lidocaine  Medication Administration  Injection # 1:    Medication: Vit B12 1000 mcg    Diagnosis: PERNICIOUS ANEMIA (ICD-281.0)    Route: IM    Site: L deltoid    Exp Date: 12/05/2011    Lot #: [id number]    Mfr: American Regent    Given by: Doristine Devoid CMA (December 13, 2010 9:01 AM)  Orders Added: 1)  Vit B12 1000 mcg [J3420] 2)  Admin of Therapeutic Inj  intramuscular or subcutaneous [96372]   Medication Administration  Injection # 1:    Medication: Vit B12 1000 mcg    Diagnosis: PERNICIOUS ANEMIA (ICD-281.0)    Route: IM    Site: L deltoid    Exp Date: 12/05/2011    Lot #: [id number]    Mfr: American Regent    Given by: Doristine Devoid CMA (December 13, 2010 9:01 AM)  Orders Added: 1)  Vit B12 1000 mcg [J3420] 2)  Admin of Therapeutic Inj  intramuscular or subcutaneous [[id number]]

## 2010-12-23 LAB — PLATELET INHIBITION P2Y12
P2Y12 % Inhibition: 38 %
Platelet Function Baseline: 274 [PRU] (ref 194–418)

## 2010-12-24 LAB — BASIC METABOLIC PANEL WITH GFR
BUN: 15 mg/dL (ref 6–23)
CO2: 29 meq/L (ref 19–32)
Calcium: 9.7 mg/dL (ref 8.4–10.5)
Chloride: 105 meq/L (ref 96–112)
Creatinine, Ser: 1.49 mg/dL (ref 0.4–1.5)
GFR calc non Af Amer: 47 mL/min — ABNORMAL LOW
Glucose, Bld: 87 mg/dL (ref 70–99)
Potassium: 4.4 meq/L (ref 3.5–5.1)
Sodium: 138 meq/L (ref 135–145)

## 2010-12-24 LAB — LIPID PANEL
LDL Cholesterol: UNDETERMINED mg/dL (ref 0–99)
Total CHOL/HDL Ratio: 4.9 RATIO
VLDL: UNDETERMINED mg/dL (ref 0–40)

## 2010-12-24 LAB — CBC
HCT: 41.7 % (ref 39.0–52.0)
Hemoglobin: 12.3 g/dL — ABNORMAL LOW (ref 13.0–17.0)
Hemoglobin: 14.7 g/dL (ref 13.0–17.0)
MCHC: 35.2 g/dL (ref 30.0–36.0)
MCV: 84.6 fL (ref 78.0–100.0)
Platelets: 125 10*3/uL — ABNORMAL LOW (ref 150–400)
Platelets: 138 10*3/uL — ABNORMAL LOW (ref 150–400)
Platelets: 171 K/uL (ref 150–400)
RBC: 4.93 MIL/uL (ref 4.22–5.81)
RDW: 14.3 % (ref 11.5–15.5)
RDW: 14.7 % (ref 11.5–15.5)
WBC: 4.9 K/uL (ref 4.0–10.5)
WBC: 6 10*3/uL (ref 4.0–10.5)

## 2010-12-24 LAB — HEPARIN LEVEL (UNFRACTIONATED)
Heparin Unfractionated: 0.37 IU/mL (ref 0.30–0.70)
Heparin Unfractionated: <0.1 IU/mL — ABNORMAL LOW (ref 0.30–0.70)

## 2010-12-24 LAB — COMPREHENSIVE METABOLIC PANEL
ALT: 34 U/L (ref 0–53)
AST: 61 U/L — ABNORMAL HIGH (ref 0–37)
Albumin: 3.4 g/dL — ABNORMAL LOW (ref 3.5–5.2)
Alkaline Phosphatase: 61 U/L (ref 39–117)
Chloride: 103 mEq/L (ref 96–112)
Creatinine, Ser: 1.48 mg/dL (ref 0.4–1.5)
GFR calc Af Amer: 58 mL/min — ABNORMAL LOW (ref >60)
Potassium: 3.8 mEq/L (ref 3.5–5.1)
Sodium: 138 mEq/L (ref 135–145)
Total Bilirubin: 0.6 mg/dL (ref 0.3–1.2)

## 2010-12-24 LAB — CK TOTAL AND CKMB (NOT AT ARMC)
CK, MB: 6.6 ng/mL (ref 0.3–4.0)
Relative Index: 5.5 — ABNORMAL HIGH (ref 0.0–2.5)
Total CK: 121 U/L (ref 7–232)

## 2010-12-24 LAB — DIFFERENTIAL
Basophils Absolute: 0 10*3/uL (ref 0.0–0.1)
Basophils Relative: 1 % (ref 0–1)
Eosinophils Absolute: 0.3 10*3/uL (ref 0.0–0.7)
Eosinophils Relative: 5 % (ref 0–5)
Monocytes Absolute: 0.4 10*3/uL (ref 0.1–1.0)
Monocytes Relative: 8 % (ref 3–12)
Neutro Abs: 2.9 10*3/uL (ref 1.7–7.7)
Neutrophils Relative %: 60 % (ref 43–77)

## 2010-12-24 LAB — BASIC METABOLIC PANEL
BUN: 12 mg/dL (ref 6–23)
GFR calc non Af Amer: 45 mL/min — ABNORMAL LOW (ref >60)
Potassium: 4.1 mEq/L (ref 3.5–5.1)

## 2010-12-24 LAB — CARDIAC PANEL(CRET KIN+CKTOT+MB+TROPI)
CK, MB: 27.3 ng/mL (ref 0.3–4.0)
Total CK: 236 U/L — ABNORMAL HIGH (ref 7–232)

## 2011-01-08 ENCOUNTER — Telehealth: Payer: Self-pay | Admitting: Cardiology

## 2011-01-08 NOTE — Telephone Encounter (Signed)
Need a refill of simvastatin 40mg  90 day supply [facility] [address]

## 2011-01-09 ENCOUNTER — Other Ambulatory Visit: Payer: Self-pay | Admitting: *Deleted

## 2011-01-09 MED ORDER — SIMVASTATIN 40 MG PO TABS: 40.0000 mg | ORAL_TABLET | Freq: Every day | ORAL | Status: DC

## 2011-01-13 ENCOUNTER — Ambulatory Visit (INDEPENDENT_AMBULATORY_CARE_PROVIDER_SITE_OTHER): Payer: Medicare Other | Admitting: *Deleted

## 2011-01-13 DIAGNOSIS — E538 Deficiency of other specified B group vitamins: Secondary | ICD-10-CM

## 2011-01-13 MED ORDER — CYANOCOBALAMIN 1000 MCG/ML IJ SOLN
1000.0000 ug | Freq: Once | INTRAMUSCULAR | Status: AC
  Administered 2011-01-13: 1000 ug via INTRAMUSCULAR

## 2011-01-16 ENCOUNTER — Other Ambulatory Visit: Payer: Self-pay | Admitting: Cardiology

## 2011-01-16 MED ORDER — CLOPIDOGREL BISULFATE 75 MG PO TABS: 75.0000 mg | ORAL_TABLET | Freq: Every day | ORAL | Status: DC

## 2011-01-16 NOTE — Telephone Encounter (Signed)
Pt needs metropolol and plavix to be [facility]. Pt wants a 90days supply

## 2011-01-22 ENCOUNTER — Other Ambulatory Visit: Payer: Self-pay | Admitting: *Deleted

## 2011-01-22 MED ORDER — CLOPIDOGREL BISULFATE 75 MG PO TABS: 75.0000 mg | ORAL_TABLET | Freq: Every day | ORAL | Status: DC

## 2011-02-12 ENCOUNTER — Ambulatory Visit (INDEPENDENT_AMBULATORY_CARE_PROVIDER_SITE_OTHER): Payer: Medicare Other | Admitting: *Deleted

## 2011-02-12 DIAGNOSIS — E538 Deficiency of other specified B group vitamins: Secondary | ICD-10-CM

## 2011-02-12 MED ORDER — CYANOCOBALAMIN 1000 MCG/ML IJ SOLN
1000.0000 ug | Freq: Once | INTRAMUSCULAR | Status: AC
  Administered 2011-02-12: 1000 ug via INTRAMUSCULAR

## 2011-02-21 NOTE — Discharge Summary (Signed)
NAMEJAYTHEN, HAMME                  ACCOUNT NO.:  [id number]   MEDICAL RECORD NO.:  [id number]          PATIENT TYPE:  OBV   LOCATION:  6527                         FACILITY:  [facility]   PHYSICIAN:  Sean A. Everardo All, M.D. [facility] OF BIRTH:  1946-03-25   DATE OF ADMISSION:  02/12/2006  DATE OF DISCHARGE:                                 DISCHARGE SUMMARY   ADDENDUM:  I discussed discharge medications with the patient.  The patient  reports having a severe reaction to nitroglycerin with loss of  consciousness.  As a result, he will not be discharged home on isosorbide  mononitrate because of a risk of similar reaction.  Cardiology does not have  any further medication recommendations at the time of discharge.  He is  scheduled for a stress test on Monday, Feb 16, 2006, and is instructed to  return to the emergency department should he develop chest pain over the  weekend.      Melissa S. Peggyann Juba, NP    ______________________________  Cleophas Dunker Everardo All, M.D. [facility]    MSO/MEDQ  D:  02/13/2006  T:  02/15/2006  Job:  [id number]

## 2011-02-21 NOTE — H&P (Signed)
[name], [name]                  ACCOUNT NO.:  [id number]   MEDICAL RECORD NO.:  [id number]          PATIENT TYPE:  OBV   LOCATION:  6527                         FACILITY:  [facility]   PHYSICIAN:  Titus Dubin. Alwyn Ren, M.D. [facility] OF BIRTH:  [birth year]   DATE OF ADMISSION:  02/12/2006  DATE OF DISCHARGE:  02/13/2006                                HISTORY & PHYSICAL   Was admitted to [facility] for observation because of chest pain on  Feb 12, 2006.  He is  a 65 year old white male who began to experience chest  pain approximately 3 p.m. on Feb 11, 2006.  It had improved by the morning of  admission but after some physical activity and eating cereal the pain  recurred.  Maalox was taken with some benefit.  The pain recurred after he  was spraying his yard.  At the time of the office visit he was experiencing  some chest discomfort.   It was described as substernal and dull and lasting for hours on May 9.  It  did radiate to the left axillary area and to the medial left upper  extremity.  He denied any nausea or sweating.   He had taken two aspirin the evening of May 9 and again the morning of May  10.   His EKG was normal but because of the chest pain it was felt that  observation was clinically appropriate.  He was transferred by a non-  emergency ambulance to the hospital to telemetry.   PAST MEDICAL HISTORY:  1.  Prostate cancer in February 2006.  2.  In 1995 he had pulmonary thromboemboli after having arthroscopy of the      right knee.  3.  In 1999 catheterization revealed a 15% block.   FAMILY HISTORY:  Coronary artery disease and stroke in his mother.   He may be allergic to LIDOCAINE.   He does not smoke or drink.   He is on baby aspirin and B12 monthly.   All review of systems were reviewed.  The only positives included some  substernal chest pain when lying in the right lateral decubitus position in  the last six months.  It would improve when he would roll  over to the left  lateral decubitus position, but would recur after 5-10 minutes in that  position as well.   Additionally, he had postural numbness of his hands at night suggesting  carpal tunnel or cervical radiculopathy.   He is followed by his urologist and was seen April 1.  His PSA was 0.00 at  that time.  He has no genitourinary symptoms at this time.  He has had no GI  symptoms specifically.  He denies melena or rectal bleeding.   He did state that this pain was somewhat like the pulmonary embolus he had  had previously.   He has had no shortness of breath, cough, or sputum production.  He  specifically had no constitutional, ophthalmologic, otolaryngologic,  dermatologic, psychiatric, or endocrine symptoms.   PHYSICAL EXAMINATION:  GENERAL:  He appeared mildly uncomfortable, but in  no  acute distress.  VITAL SIGNS:  Blood pressure 168/100, pulse 84, respiratory rate 24.  SKIN:  Warm and dry.  He was describing scapular dull pain at time of  examination.  HEENT:  Arteriolar narrowing is noted on ophthalmologic examination.  Pupils  are equal, round, and reactive to light.  Otolaryngologic examination is  unremarkable.  Dental hygiene is fair to good.  There is no lymphadenopathy  of the head, neck, or axilla.  HEART:  An S4 is present without murmurs or gallops.  CHEST:  Clear with no increased work of breathing and no rubs or rales.  ABDOMEN:  Nontender.  He has no organomegaly.  There was a suprapubic  operative scar.  MUSCULOSKELETAL:  Reveals crepitus or the knees.  NEUROLOGIC:  There are no localizing neuropsychiatric deficits.   The physical was complete with the exception of rectal examination and  genitourinary examination as these have been completed recently by the  urologist.   Skin was warm and dry.  He does have a mustache and beard.   EKG, as stated, was normal.  He is now admitted to telemetry with morphine  for pain control.   Additional history:   Father had prostate cancer as well.   A copy of the July 20, 2004 dictation was to have been sent with the  patient as well.      Titus Dubin. Alwyn Ren, M.D. [facility]  Electronically Signed     [facility]/MEDQ  D:  02/16/2006  T:  02/16/2006  Job:  [id number]

## 2011-02-21 NOTE — Consult Note (Signed)
NAMEBARNARD, SHARPS                  ACCOUNT NO.:  [id number]   MEDICAL RECORD NO.:  [id number]          PATIENT TYPE:  OBV   LOCATION:  6527                         FACILITY:  [facility]   PHYSICIAN:  Rollene Rotunda, M.D.   DATE OF BIRTH:  Jul 26, 1946   DATE OF CONSULTATION:  02/13/2006  DATE OF DISCHARGE:                                   CONSULTATION   PRIMARY CARE PHYSICIAN:  Dr. Alwyn Ren   PRIMARY CARDIOLOGIST:  Dr. Antoine Poche (new)   CHIEF COMPLAINT:  Chest pain.   HISTORY OF PRESENT ILLNESS:  Mr. Atiyeh is a 65 year old male with no history  of coronary artery disease.  He had onset of substernal chest pain that  radiated through to his back and reached approximately 6-7 out of 10, 2 days  prior to admission.  It started 3:00 p.m. and he was at rest.  He has had  similar episodes before but this was worse and lasted longer.  He took  Maalox which did not changes his symptoms much and aspirin which he feels  like helped but did not relieve his symptoms.  The pain was still there at a  lower level when he went to bed.  When he woke up he still had pain.  He had  some things to do around the house and was fairly active moving boxes and  doing reasonably strenuous work.  His symptoms improved.  He had called his  primary MD and when he went to see his primary MD he was still not pain free  and his blood pressure was elevated.  He received four baby aspirin, O2, and  25 mg metoprolol.  He was sent by EMS to the emergency room.  The patient is  not sure exactly when, and did not get any sublingual nitroglycerin, but  some time between then and when he went to sleep that night his symptoms  resolved.   All of Mr. Mizrahi previous chest pain episodes have started at rest.  He  feels that they have all gotten better and resolved with activity.  He has  never had an episode this bad before.  He is currently pain free.   PAST MEDICAL HISTORY:  #1. Status post cardiac catheterization 5-6 years  ago  done at [facility], no significant disease.  #2. Hyperlipidemia.  #3. B12 deficiency anemia.  #4. History of prostate cancer.  #5. History of PE in 1995 after knee surgery.   He has no history of diabetes or hypertension although his blood pressure  was elevated over the last 48 hours.  He has family history of coronary  artery disease but not premature.  He has no history of alcohol, tobacco or  drug use.   PAST SURGICAL HISTORY:  Status post prostatectomy, cholecystectomy,  tonsillectomy and right knee arthroscopy.   ALLERGIES:  LIDOCAINE.   MEDICATIONS PRIOR TO ADMISSION:  Aspirin 325 mg a day, B12 shots every month  as well as Levitra or Viagra p.r.n.  He is currently on Lovenox 1 mg/kg q.12  hours, Protonix 40 mg daily and Metoprolol  25 mg b.i.d.   SOCIAL HISTORY:  He lives with his wife and is retired from [employer].  He  is active around the house and yard.  He does not exercise regularly.  He  thinks his diet is reasonable but does not concentrate on a heart healthy  diet.   FAMILY HISTORY:  His mother died at age 28 with a history of heart disease  in her 28's, and she had also had a stroke.  His father died at age 48 and  had cancer but no heart disease.  He has three siblings and none of them  have heart disease.   REVIEW OF SYSTEMS:  He has occasional reflux symptoms and rare right lower  quadrant abdominal pain.  He denies any arthralgias.  He denies hematemesis,  hemoptysis or melena.  He does not wear glasses and does not have a history  of hearing loss.  Since the prostate surgery he has not had any problems  with urination.  He does have erectile dysfunction.  Review of systems is  otherwise negative.   PHYSICAL EXAMINATION:  VITAL SIGNS:  Temperature is 98.1, blood pressure  138/62 with no significant change in either arm.  Pulse 70, respiratory rate  20, O2 saturation 95% on room air.  GENERAL:  He is a well-developed, well-nourished white  male in no acute  distress.  HEENT:  Head is normocephalic, atraumatic with pupils equal, round, reactive  to light and accommodation.  Extraocular movement is intact.  Sclera clear.  Nares without discharge.  NECK:  There is no lymphadenopathy, thyromegaly, bruit or JVD noted.  CARDIOVASCULAR:  Heart is regular rate and rhythm with an S1-S2 and no  significant murmur, rub or gallop is noted.  Distal pulses are 2+ and no  femoral bruits are appreciated.  LUNGS: Are clear to auscultation bilaterally without wheezing or crackles.  SKIN:  No rashes or lesions are noted.  ABDOMEN:  Soft and nontender with active bowel sounds.  EXTREMITIES:  He has no calf tenderness, no cyanosis, clubbing or edema is  noted.  MUSCULOSKELETAL:  There is no joint deformity or effusions and no spine or  CVA tenderness.  NEURO:  He is alert and oriented.  Cranial nerves II-XII  grossly intact.   CHEST X-RAY:  No acute disease.   EKG sinus rhythm rate 69 with no acute ischemic changes.  No old EKG is  available for comparison.   LABORATORY DATA:  D-dimer within normal limits 0.26. Total cholesterol 193,  triglycerides 232, HDL 39, LDL 108.  CK-MB and troponin I negative x3.  Hemoglobin 12.64 WBC 5600, hematocrit 35.6, platelets 173,000.  Sodium 138,  potassium 4.1, chloride 105, C02 28, BUN 15, creatinine 1.4, glucose 87.   IMPRESSION:  1.  Chest pain - Dr. Antoine Poche has discussed this patient's situation fully      with the patient and his wife.  There is a low pretest probability of      obstructive coronary artery disease.  He has atypical symptoms and a low      risk factor profile.  The patient and his wife were given the option of      an outpatient Cardiolite and they do prefer outpatient evaluation.  He      is also at low risk for PE or aortic dissection.  He will follow-up in     our office with a stress test on Monday and then follow up with Dr.  Hochrein on a p.r.n. basis.  He is to  follow-up with Dr. Alwyn Ren as well.      He is encouraged to follow a heart healthy diet with no medical therapy      for hyperlipidemia unless he tests positive for coronary artery disease.      The patient is otherwise stable and under the excellent      care of his primary care team.  He is considered stable for discharge      from a  cardiac standpoint on Feb 13, 2006 and will follow up next week      as an outpatient.  Dr. Antoine Poche saw the patient and determined the plan      of care.      Theodore Demark, P.A. [facility]    ______________________________  Rollene Rotunda, M.D.    RB/MEDQ  D:  02/13/2006  T:  02/14/2006  Job:  [id number]

## 2011-02-21 NOTE — Discharge Summary (Signed)
NAMEKEYLER, HOGE                  ACCOUNT NO.:  [id number]   MEDICAL RECORD NO.:  [id number]          PATIENT TYPE:  OBV   LOCATION:  6527                         FACILITY:  [facility]   PHYSICIAN:  Sean A. Everardo All, M.D. [facility] OF BIRTH:  1946/04/15   DATE OF ADMISSION:  02/12/2006  DATE OF DISCHARGE:  02/13/2006                                 DISCHARGE SUMMARY   DISCHARGE DIAGNOSIS:  1.  Atypical chest pain.  2.  Probable gastroesophageal reflux disease.   HISTORY OF PRESENT ILLNESS:  The patient is a 65 year old white male with no  history of coronary artery disease who presented with complaints of onset of  substernal chest pain radiating to his back which started at approximately  3:00 p.m. on Wednesday of this week.  He did not find any relief with  Maalox.  The symptoms improved on Thursday morning, but were still present.  He went to see his primary care and was transferred to the hospital for  further evaluation.   PAST MEDICAL HISTORY:  1.  Status post cardiac catheterization done at [facility] in 1999 with no      significant disease.  2.  History of dyslipidemia.  3.  B-12 deficiency anemia.  4.  History of prostate cancer.  5.  History of PE in 1995 after knee.   COURSE OF HOSPITALIZATION:  1.  Atypical chest pain.  The patient was admitted and was placed on full      dose Lovenox.  Serial cardiac enzymes were cycled which were negative      x3.  A D-dimer was within normal limits at a value of 0.26.  The patient      was evaluated by cardiology who felt that he was low risk for PE or      aortic disk dissection and suggested an outpatient stress test.  This      has been arranged for Feb 16, 2006.  He will be sent home on isosorbide      mononitrate 20 mg once daily and on Protonix and baby aspirin.   He was noted to have mild elevation of his triglycerides and LDL and is  instructed to maintain a low-fat, low-cholesterol diet.   MEDICATIONS AT DISCHARGE:  1.  Aspirin  81 mg p.o. daily.  2.  Protonix 40 mg p.o. daily.  3.  Isosorbide mononitrate 20 mg p.o. daily.   PERTINENT LABORATORIES AT DISCHARGE:  BUN 15, creatinine 1.4, hemoglobin  12.4, hematocrit 35.6.   DISPOSITION:  Plan to transfer the patient home.   FOLLOW UP:  1.  The patient is instructed to follow up with Dr. Antoine Poche as needed.  2.  He is scheduled for a stress test at [facility] on Monday, Feb 16, 2006 at 7:15 a.m.  3.  He is also instructed to follow up with Dr. Alwyn Ren in 1-2 weeks and call      for an appointment.      Melissa S. Peggyann Juba, NP    ______________________________  Cleophas Dunker Everardo All, M.D. [facility]  MSO/MEDQ  D:  02/13/2006  T:  02/15/2006  Job:  [id number]   cc:   Titus Dubin. Alwyn Ren, M.D. [facility]  [phone] [address]  [place] [address]

## 2011-02-21 NOTE — Discharge Summary (Signed)
NAMEGERBER, PENZA                  ACCOUNT NO.:  [id number]   MEDICAL RECORD NO.:  [id number]          PATIENT TYPE:  INP   LOCATION:  0346                         [facility]ACILITY:  [facility]   PHYSICIAN:  Mark C. Vernie Ammons, M.D.  DATE OF BIRTH:  1945/12/28   DATE OF ADMISSION:  11/18/2004  DATE OF DISCHARGE:  11/21/2004                                 DISCHARGE SUMMARY   ADMITTING DIAGNOSES:  Prostate cancer.   DISCHARGE DIAGNOSES:  Prostate cancer.   PROCEDURES PERFORMED:  Nerve sparing radical retropubic prostatectomy with  bilateral pelvic lymph node dissection (November 18, 2004).   BRIEF HISTORY OF PRESENT ILLNESS:  Mr. Bona is a pleasant 65 year old male  who was initially evaluated for an elevated PSA of 5.4.  He underwent a  transrectal ultrasound guided prostate biopsy which showed a Gleason 3+4=7  adenocarcinoma of the prostate involving 20% of his right-sided biopsies.  This was consistent with clinical stage T1C disease.  Several options for  management of his prostate cancer were discussed including watchful waiting,  cryo therapy, chemotherapy, hormone ablation, external beam therapy, and  brachytherapy.  Mr. Rainford decided to proceed with surgical management of his  disease and is admitted to undergo a radical retropubic prostatectomy.   HOSPITAL COURSE:  Following admission the patient was taken to the operating  room where he underwent a radical retropubic nerve sparing prostatectomy  with bilateral pelvic lymph node dissection.  Patient tolerated the  procedure well and there were no complications.  For a full summary of the  surgical procedure please see the dictated operative note dated November 18, 2004.  Postoperatively the patient was transferred to the floor where he did  well overnight on postoperative day #1.  He began ambulating on  postoperative day #1 and was tolerating clear liquids.  He was  hemodynamically stable and his hemoglobin was stable at 9.8.  By  postoperative day #2 the patient continued to do well.  His JP output was  minimal and his urine was clear.  He had had a bowel movement.  Patient was  ready for discharge.  On postoperative day #3 his JP drain was discontinued  prior to his discharge.  His pathology returned consistent with pathologic  T2C disease with no nodes or metastasis.  The remainder of hospital course  was uneventful.   DIET AND ACTIVITY:  The patient is discharged to home on a regular diet.  Activity level is restricted and to include no bending, stooping, lifting,  driving, or moving heavy objects.   DISCHARGE MEDICATIONS:  1.  Tylox.  2.  Cipro.  3.  Colace.   CONDITION ON DISCHARGE/FOLLOW-UP:  On the day of discharge the patient is  able to tolerate a p.o. diet, ambulate without assistance, and has good  urine output through his Foley catheter.   He is asked to call Dr. Margrett Rud office or the urology physician on-call  with any further questions or concerns.  This includes a temperature greater  than 101.5 as well as any erythema, drainage, or opening of the patient's  surgical wounds.  EG/MEDQ  D:  11/21/2004  T:  11/21/2004  Job:  [id number]

## 2011-02-21 NOTE — Op Note (Signed)
[facility]  Patient:    [name], [name]                         MRN: [id number] Proc. Date: 03/23/01 Adm. Date:  [id number] Attending:  Henrene Dodge CC:         Roxy Manns, M.D. [facility]   Operative Report  PREOPERATIVE DIAGNOSIS:  Chronic cholecystitis.  POSTOPERATIVE DIAGNOSIS:  Chronic cholecystitis.  OPERATION PERFORMED:  Laparoscopic cholecystectomy with cholangiogram.  ANESTHESIA:  General.  HISTORY OF PRESENT ILLNESS:  [name] is a 65 year old Caucasian male who was referred to me by Dr. Milinda Antis for management of symptomatic gallstones. The patient has been having episodes of epigastric pain for the last several weeks. I had removed his wifes gallbladder about six months ago and because of these episodes of pain, he saw Dr. Milinda Antis who did an EKG and this was unremarkable and then sent him for an ultrasound which showed stones within the gallbladder with impacted stone in the neck of the gallbladder. His CBC and liver function studies were normal. From a chest x-ray, he had some scarring in the left base but he has had a PE following knee surgery several years ago and this is thought to be a chronic problem. The patient otherwise is in good health and has elected a 24 hour evaluation for cholecystectomy.  DESCRIPTION OF PROCEDURE:  The patient was taken to the operative suite, he was given 3 gm of Unasyn and his PAS stockings and positioned on the OR table. Induction of general anesthesia, endotracheal tube. Dr. Shireen Quan was the anesthetist and then the abdomen was prepped with Betadine surgical scrub and solution and draped in a sterile manner. A little small incision was made at the umbilicus and sharp dissection in through the skin. The fascia was identified and picked up with two Kochers, small opening made. The underlying peritoneum was identified and carefully a Tresa Endo was placed through this and then there are no adhesions around  the umbilicus. A pursestring suture of #0 Vicryl was placed and then the Hasson cannula introduced. Carbon dioxide was then used and he has a big floppy distended gallbladder not acutely inflamed. There were some adhesions really at the falciform area with the inflation that was a little pulling with a little bit of oozing from the attachment of the falciform and this was recognized and we were taking care the rest of the surgery that this was not extended. The upper 10 mm trocar was placed under direct vision and the two lateral 5 mm trocars were placed by Dr. Orpah Greek. The gallbladder was retracted upward and outward. The proximal gallbladder was freed up, the cystic duct was identified and a clip was placed flush with the cystic duct and the gallbladder and then a small opening made. The taut catheter had reduced within the cystic duct. An x-ray was obtained which showed a good prompt filling of the extrahepatic biliary system and good flow into the duodenum. The patient has kind of a long cystic duct and I dissected out about another inch of the cystic duct and then clipped it at this point with three clips and then divided the cystic duct. The cystic artery was next identified and this was doubly clipped proximally, singly, distally and divided. Then the gallbladder was freed from its bed using the hook electrocautery initially and then switching to the spatula. After the gallbladder was freed, the bed was good hemostasis and then I  touched this little raw surface of the liver that was about the size of a postage stamp and this was good hemostasis. The gallbladder was then withdrawn at the umbilicus and then the pursestring suture was tied and a second figure-of-eight suture was placed in the fascia. I then on looking at the area where the falciform was kind of pulling. I used the hot scissors to kind of free this adhesion site so that it was not causing any tension in the postoperative  period. There was good hemostasis. The irrigating fluid had been removed. The two lateral 5 mm trocars were withdrawn under direct vision. Next, the carbon dioxide was released and the upper 10 mm trocar withdrawn. I did place a stitch in the anterior fascia of the upper 10 mm trocar and then the subcuticular wounds were closed with 4-0 monocryl and then Benzoin and Steri-Strips on the skin. The patient tolerated the procedure nicely. I did not use Marcaine since he may be ALLERGIC TO MARCAINE, following a hand procedure he sort of passed out with the Marcaine. We will just use pain medication. The patient should be able to be released in the a.m. I talked with his wife about not doing any real strenuous digging, etc. for approximately three weeks. He works for a [employer] with his son. DD:  03/23/01 TD:  03/23/01 Job: [id number]

## 2011-02-21 NOTE — Op Note (Signed)
[name], [name]                  ACCOUNT NO.:  [id number]   MEDICAL RECORD NO.:  [id number]          PATIENT TYPE:  INP   LOCATION:  0346                         FACILITY:  [facility]   PHYSICIAN:  Mark C. Vernie Ammons, M.D.  DATE OF BIRTH:  [birth year]   DATE OF PROCEDURE:  11/18/2004  DATE OF DISCHARGE:  11/21/2004                                 OPERATIVE REPORT   PREOPERATIVE DIAGNOSES:  Prostate cancer.   POSTOPERATIVE DIAGNOSES:  Prostate cancer.   PROCEDURE:  Nerve sparing radical retropubic prostatectomy with bilateral  pelvic lymph node dissection.   ATTENDING SURGEON:  Mark C. Vernie Ammons, M.D.   RESIDENT SURGEON:  Thyra Breed, M.D.   ANESTHESIA:  General endotracheal.   ESTIMATED BLOOD LOSS:  700 mL.   DRAINS:  A 22 French Foley catheter to straight drain and a 10 [place] Blake  drain to bulb suction.   SPECIMENS:  1.  Bilateral pelvic lymph node packets for pathologic analysis.  2.  Prostate.   COMPLICATIONS:  None.   INDICATIONS FOR PROCEDURE:  [name] is a pleasant 65 year old male who was  initially evaluated for an elevated PSA of 5.4.  He underwent a transrectal  ultrasound guided prostate biopsy which revealed a Gleason 3+4=7  adenocarcinoma of the prostate involving 20% of the right sided biopsies.  The patient had some mild lower urinary tract symptoms. Given his clinical  stage T1C disease, several options for treatment were discussed. These  include watchful waiting, cryosurgery, chemotherapy, hormonal ablation,  external beam radiation therapy as well as brachytherapy. [name] has  decided to proceed with surgical extirpation of his prostate as definitive  surgical management of his disease.  All the risks, benefits, and  alternatives of the procedure have been described in detail and the patient  is willing to proceed.  Informed consent is obtained.   DESCRIPTION OF PROCEDURE:  Following identification by his arm bracelet, the  patient was brought to the  operating room and placed in the supine position.  Here he underwent successful induction of general endotracheal anesthesia  and received preoperative IV antibiotics.  His lower abdomen and genitalia  were then shaved, prepped with Betadine and draped in the usual sterile  fashion.  A 22 French Foley catheter was then inserted into the bladder and  the bladder was drained. A lower midline incision was created using the  scalpel of approximately 8 cm below the umbilicus to the level of the pubis.  The incision was carried through the subcutaneous tissues to the anterior  rectus fascia using Bovie electrocautery. Any subcutaneous bleeding was  controlled using the Bovie. The rectus fascia was then opened in the midline  and the rectus muscle identified. We isolated the midline with blunt  dissection. The transversalis fascia was then opened and blunt dissection  used to expose both the right and the left pelvic fossa.  The Bookwalter  self retaining retractor was then assembled.  We then proceeded with a right  sided pelvic lymph node dissection. Superiorly our limit was the iliac  artery, inferiorly the obturator nerve which was  kept in plain view during  the entire dissection. We carried out dissection to the level of the  bifurcation of the iliac arteries.  All vascular and lymphatic channels were  controlled using small Hem-o-lok  clips. There was no evidence of gross  nodal disease during the dissection. We then performed an identical lymph  node dissection on the left again using small Hem-o-lok  clips to control  all vascular and lymphatic channels. Again there was no evidence of gross  nodal disease on the left sided lymph node dissection. We then turned our  attention to the endopelvic fascia. This was identified and cleaned using a  Kitner. A Bovie was used to score the endopelvic fascia, a right angled  clamp was then inserted and the lateral aspect of the endopelvic fascia   overlying the prostate was freed using Metzenbaum scissors. This allowed the  neurovascular bundles to be swept posterolateral from the prostate and  spared.  We then performed an identical procedure on the left endopelvic  fascia. The lateral most puboprostatic ligaments were then taken down using  Metzenbaum scissors. The fat overlying the dorsal venous complex was then  gently teased using the sucker and the Kidner. Any superficial veins were  then controlled using the Bovie.  The surgeon's index finger and thumb could  then be placed around the prostate and a path anterior to the urethra and  posterior to the dorsal venous complex was created.  A Hoenfeltner clamp was  then passed just beneath the dorsal venous complex. A #1 Vicryl tie was then  used to place and ligate the dorsal vein complex. A second #1 Vicryl tie was  then placed. The needle was left on the suture and the suture used to tent  the dorsal venous complex anteriorly through the periosteum of the pubis. We  then placed a Stamey retractor and placed a back bleeding stitch consisting  of a 2-0 Vicryl suture on a UR-5 needle in a figure-of-eight fashion.  The  dorsal venous complex was then divided exposing the apex of the prostate and  the urethra. Metzenbaum scissors were then used to separate the  neurovascular bundles from the urethra laterally. A right angled clamp was  then passed beneath the urethra and the moistened umbilical tape passed. The  anterior urethra was then divided sharply using long handled scalpel at the  apex of the prostate. There was a nice urethral stump at this point.  The  Foley catheter was then lubricated, cut and pulled into the wound and used  to provide traction on the prostate.  At this point before dividing the  posterior urethra, we placed the [facility] device in the urethra and placed our 10, 12 and 2 o'clock anastomotic sutures in an outside to in fashion  with 2-0 Vicryl sutures. The  remainder of the urethra was then divided using  Metzenbaum scissors. The remainder of the prostate was then easily dissected  off the rectum bluntly gently removing all rectourethralis attachments. The  seminal vesicles could be easily palpated by the surgeon's finger. We then  took down the lateral pedicles in a successive fashion using right angled  dissection and large right angle clips. Once the prostate was sufficiently  elevated, the anterior leaf of Denonvillier's fascia was incised overlying  the seminal vesicles. Both ampulla of the vas were identified, dissected out  and ligated using large clips. Similarly the seminal vesicles were also  dissected out and ligated using large clips.   We  then turned our attention anteriorly to the location of the bladder neck.  Two Allis clamps were used to grasp either side of the bladder neck. Using a  tonsil clamp and Bovie electrocautery, the bladder neck fibers were  carefully dissected out.  Using a Kitner, the urethra was carefully  dissected out. A right angled clamp was placed beneath the urethra and the  urethra then divided. The Foley catheter balloon was deflated and the  catheter brought from the bladder and used to provide further traction on  the prostate.  The posterior bladder neck was then divided along with any  remaining posterior prostatic attachments. The prostatic specimen was then  delivered for further pathologic analysis. Interrupted 4-0 chromic sutures  were used to evert the bladder neck mucosa.  A 2-0 chromic suture was then  used in a running fashion to reapproximate the bladder neck in a tennis  racquet fashion.  Care was taken throughout this portion of the procedure to  avoid the ureteral orifices. Both ureteral orifices were identified within  the bladder and seemed to be well away from the bladder neck. The residual  bladder neck was large enough to admit only the tip of the small fifth  surgeon's finger.  At  this point, we placed our remaining two anastomotic  sutures in our urethral stump. These were placed at the 5 and 7 o'clock  position on the urethral stump in an outside to in fashion using 2-0 Vicryl.  We then placed a fresh Foley catheter through the urethra and guided it into  the bladder. 15 mL of sterile water were used to fill the balloon. We then  placed our anastomotic sutures in the bladder from an inside to out fashion  corresponding to those on the urethral stump.  Prior to inserting the Foley  catheter into the bladder, the wound was copiously irrigated and any  remaining bleeding was controlled using small clips and suture ligated.  Hemostasis was excellent at this point.  All slack was then removed from the  anastomotic sutures.  The sutures were tied and trimmed. A Toomey syringe  was used to irrigate the Foley catheter. The anastomosis was seen to be water tight and the irrigant was clear.  We then placed a #10 Blake drain  through a separate stab wound on the left lateral aspect of our abdominal  incision.  The drain was placed in the vicinity of the anatomic area as well  as both pelvic fossa. 3-0 nylon was used to affix the drain to the patient's  skin. A #1 running PDS was then used to close the fascia. The subcutaneous  tissues were irrigated with sterile saline.  Surgical clips were used to  close the skin. All sponge, needle and instrument counts were correct x2.  Please note that Dr. Vernie Ammons was present and participated in the entire  procedure as he was the responsible surgeon. The patient tolerated the  procedure well and there were no complications.   DISPOSITION:  After awaking from general anesthesia, the patient was  transferred to the post anesthesia care unit in stable condition.  From  here, he will be transferred to the floor for further postoperative  management.      EG/MEDQ  D:  11/21/2004  T:  11/21/2004  Job:  [id number]

## 2011-03-14 ENCOUNTER — Ambulatory Visit (INDEPENDENT_AMBULATORY_CARE_PROVIDER_SITE_OTHER): Payer: Medicare Other | Admitting: *Deleted

## 2011-03-14 DIAGNOSIS — E538 Deficiency of other specified B group vitamins: Secondary | ICD-10-CM

## 2011-03-14 MED ORDER — CYANOCOBALAMIN 1000 MCG/ML IJ SOLN
1000.0000 ug | Freq: Once | INTRAMUSCULAR | Status: AC
  Administered 2011-03-14: 1000 ug via INTRAMUSCULAR

## 2011-03-17 ENCOUNTER — Encounter: Payer: Self-pay | Admitting: Cardiology

## 2011-03-18 ENCOUNTER — Ambulatory Visit (INDEPENDENT_AMBULATORY_CARE_PROVIDER_SITE_OTHER): Payer: Medicare Other | Admitting: Cardiology

## 2011-03-18 DIAGNOSIS — I2510 Atherosclerotic heart disease of native coronary artery without angina pectoris: Secondary | ICD-10-CM

## 2011-03-18 DIAGNOSIS — I10 Essential (primary) hypertension: Secondary | ICD-10-CM

## 2011-03-18 DIAGNOSIS — E785 Hyperlipidemia, unspecified: Secondary | ICD-10-CM

## 2011-03-18 MED ORDER — LOSARTAN POTASSIUM 25 MG PO TABS: 25.0000 mg | ORAL_TABLET | Freq: Every day | ORAL | Status: DC

## 2011-03-18 NOTE — Patient Instructions (Signed)
Stop Plavix.   Decrease Aspirin to 162mg  daily--this will be two 81mg  aspirin daily.  Start Losartan 25mg  daily.  Schedule an appointment for lab in 2 weeks--BMP 401.1 [phone]  Take and record your blood pressure about 2 hours after you take your medication. I will call you in 2 weeks to get the readings. Luana Shu  Your physician wants you to follow-up in: 6 months. (December 2012). You will receive a reminder letter in the mail two months in advance. If you don't receive a letter, please call our office to schedule the follow-up appointment.

## 2011-03-19 ENCOUNTER — Encounter: Payer: Self-pay | Admitting: Cardiology

## 2011-03-19 NOTE — Progress Notes (Signed)
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  Patient was admitted to [facility] in 5/11 with NSTEMI.  He had a PROMUS DES to the mid RCA.  He had residual moderate disease (60-70%) in the LAD that was not intervened upon.   P2Y12 testing showed good platelet inhibition with Plavix.  Recent echo showed preserved EF.    Since last appointment, patient has been doing well.  He has had no chest pain.  No exertional dyspnea.  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  His main complaint is low energy level/fatigue.  This has been going on for over a year.  His TSH was normal in 8/11.    He says that he snores but does not think that it is excessive and his wife has never told him that he gasps or stops breathing.  BP is high in the office today and has been high at home as well.   Labs (5/11): creatinine 1.56, TGs 478, HDL 35, P2Y12 testing with PRU < 230 (goal) Labs (8/11): LDL 69, HDL 35, TGs 284, creatinine 1.5, TSH normal Labs (1/12): LDL 76, HDL 37, TGs 287  ECG: NSR, normal  Allergies:  1)  ! * Hydroxyzine 2)  Lidocaine  Past Medical History: 1. Hypertension: Did not tolerate lisinopril even at low dose.  2. Hyperlipidemia 3. Mild anemia, B12 deficiency,PMH of 4. Prostate cancer, hx of 5. Skin cancer, PMH  of 6. S/P  TKR 7. CAD: NSTEMI 5/11.  LHC showed 60% prox LAD, 70% mid LAD, 95% mid RCA with TIMI 2 flow distally, EF 60%.  Patient had PROMUS DES to [facility].  8. Renal insufficiency, creatinine 1.5  in 05/2010  Family History: mother :CAD S/P  CABG  in 91s, CVA father: prostate cancer; bro: HTN  Social History: Never Smoked Retired from [employer], works on his farm in [place] Alcohol use-no Regular exercise: physically active on farm Married  ROS: All systems reviewed and negative except as per HPI.   Current Outpatient Prescriptions  Medication Sig Dispense Refill  . cyanocobalamin (,VITAMIN B-12,)  1000 MCG/ML injection Inject 1,000 mcg into the muscle every 30 (thirty) days.        . fish oil-omega-3 fatty acids 1000 MG capsule Take 2 g by mouth 2 (two) times daily.        . metoprolol (LOPRESSOR) 50 MG tablet TAKE 1 TABLET TWICE A DAY  60 tablet  10  . Potassium Chloride (K+ POTASSIUM PO) Take by mouth daily.        . simvastatin (ZOCOR) 40 MG tablet Take 1 tablet (40 mg total) by mouth daily.  90 tablet  2  . aspirin EC 81 MG tablet Take two tablets daily      . losartan (COZAAR) 25 MG tablet Take 1 tablet (25 mg total) by mouth daily.  30 tablet  6    BP 155/83  Pulse 62  Ht 6\' 2"  (1.88 m)  Wt 220 lb (99.791 kg)  BMI 28.25 kg/m2 General:  Well developed, well nourished, in no acute distress. Neck:  Neck supple, no JVD. No masses, thyromegaly or abnormal cervical nodes. Lungs:  Clear bilaterally to auscultation and percussion. Heart:  Non-displaced PMI, chest non-tender; regular rate and rhythm, S1, S2 without murmurs, rubs. Soft S4. Carotid upstroke normal, no bruit. Pedals normal pulses. No edema, no varicosities. Abdomen:  Bowel sounds positive; abdomen soft and non-tender  without masses, organomegaly, or hernias noted. No hepatosplenomegaly. Extremities:  No clubbing or cyanosis. Neurologic:  Alert and oriented x 3. Psych:  Normal affect.

## 2011-03-19 NOTE — Assessment & Plan Note (Signed)
Status post single DES to the RCA in the setting of NSTEMI.  Patient had residual moderate disease in the LAD.  He has been doing well with no ischemic symptoms.  He wants to stop Plavix.  It has been > 1 year since his PCI.  I think that it would be ok to stop it.  He will continue ASA at a dose of 162 mg daily as well as statin and metoprolol.

## 2011-03-19 NOTE — Assessment & Plan Note (Signed)
BP is running high.  Patient was unable to tolerate ACEI.  I will start him on losartan at 25 mg daily with BMET and BP check in 2 wks.  Losartan can be titrated up.

## 2011-03-19 NOTE — Assessment & Plan Note (Signed)
LDL was better when last checked.  HDL is low and triglycerides are high.  He has been talking to Dr. Alwyn Ren about dietary changes to help this profile.  Continue current statin dose.

## 2011-04-01 ENCOUNTER — Telehealth: Payer: Self-pay | Admitting: *Deleted

## 2011-04-01 NOTE — Telephone Encounter (Signed)
HYPERTENSION - Marca Ancona, MD 03/19/2011 11:37 PM Signed  BP is running high. Patient was unable to tolerate ACEI. I will start him on losartan at 25 mg daily with BMET and BP check in 2 wks. Losartan can be titrated up.   04/01/11 I called pt to get BP readings. Pt states BP averaging 130s/60s. Pt states he feels like Losartan

## 2011-04-01 NOTE — Telephone Encounter (Signed)
Note continued. Pt states he feels Losartan is causing him to be sun sensitive and giving him a headache. He is going to have BMP done 04/02/11. He is willing to try Losartan a couple more weeks. I will forward to Dr Shirlee Latch for review.

## 2011-04-02 ENCOUNTER — Other Ambulatory Visit (INDEPENDENT_AMBULATORY_CARE_PROVIDER_SITE_OTHER): Payer: Medicare Other | Admitting: *Deleted

## 2011-04-02 DIAGNOSIS — I10 Essential (primary) hypertension: Secondary | ICD-10-CM

## 2011-04-02 DIAGNOSIS — I2510 Atherosclerotic heart disease of native coronary artery without angina pectoris: Secondary | ICD-10-CM

## 2011-04-02 LAB — BASIC METABOLIC PANEL
BUN: 20 mg/dL (ref 6–23)
Chloride: 106 mEq/L (ref 96–112)
Potassium: 4.4 mEq/L (ref 3.5–5.1)

## 2011-04-02 NOTE — Telephone Encounter (Signed)
I talked with pt. He will continue Losartan for now. He will let me know if he feels he cannot continue.

## 2011-04-02 NOTE — Telephone Encounter (Signed)
He can try losartan for a couple more weeks and if still problematic we can change to something else.

## 2011-04-11 ENCOUNTER — Telehealth: Payer: Self-pay | Admitting: Cardiology

## 2011-04-11 DIAGNOSIS — I10 Essential (primary) hypertension: Secondary | ICD-10-CM

## 2011-04-11 DIAGNOSIS — I2510 Atherosclerotic heart disease of native coronary artery without angina pectoris: Secondary | ICD-10-CM

## 2011-04-11 NOTE — Telephone Encounter (Signed)
Stop losartan, replace with amlodipine 2.5 mg daily.

## 2011-04-11 NOTE — Telephone Encounter (Signed)
Pt calling stating he has been on losartin x 1 month and still feels extreme fatigue when taking this med--advised i would let dr Shirlee Latch know how med is effecting him and have anne lankford or dr Shirlee Latch call him with new med or advise him on getting triglycerides down--pt agrees--nt

## 2011-04-11 NOTE — Telephone Encounter (Signed)
At last OV pt was put on losartan 25mg  pt has about 5 pills left and he was to be put on something else because this makes him so tired so he was calling to get his other Rx called in

## 2011-04-14 ENCOUNTER — Ambulatory Visit (INDEPENDENT_AMBULATORY_CARE_PROVIDER_SITE_OTHER): Payer: Medicare Other | Admitting: *Deleted

## 2011-04-14 ENCOUNTER — Telehealth: Payer: Self-pay | Admitting: Cardiology

## 2011-04-14 DIAGNOSIS — D510 Vitamin B12 deficiency anemia due to intrinsic factor deficiency: Secondary | ICD-10-CM

## 2011-04-14 MED ORDER — ATORVASTATIN CALCIUM 20 MG PO TABS: 20.0000 mg | ORAL_TABLET | Freq: Every day | ORAL | Status: DC

## 2011-04-14 MED ORDER — AMLODIPINE BESYLATE 2.5 MG PO TABS: 2.5000 mg | ORAL_TABLET | Freq: Every day | ORAL | Status: DC

## 2011-04-14 NOTE — Telephone Encounter (Signed)
Pt is on Simvastatin 40mg  daily.  I will forward to Dr Shirlee Latch for review.

## 2011-04-14 NOTE — Telephone Encounter (Signed)
Pt returning call to [facility].

## 2011-04-14 NOTE — Telephone Encounter (Signed)
I talked with pt. He will stop Losartan and start Amlodipine 2.5mg  daily.

## 2011-04-14 NOTE — Telephone Encounter (Signed)
Pt to stop losartan and start Amlodipine 2.5mg  daily. Pt to stop simvastatin and start atorvastatin 20mg  daily. I discussed with pt.

## 2011-04-14 NOTE — Telephone Encounter (Signed)
I talked with pt. He will stop Losartan and start Amlodipine 2.5mg  daily. He will stop Simvastatin and start Atorvastatin 20mg  daily

## 2011-04-14 NOTE — Telephone Encounter (Signed)
Pt called Friday. Pt wants to get new BP medicine. Current BP med makes pt sleepy pt stated BP RX about to run out pt wants to know if he can change BP medicine.

## 2011-04-14 NOTE — Telephone Encounter (Signed)
I reviewed with Dr Shirlee Latch. He recommended pt stop Simvastatin 40mg  and start Atorvastatin 20mg . LMTCB

## 2011-04-14 NOTE — Telephone Encounter (Signed)
Katina Dung, RN 04/14/2011 10:08 AM Signed  I talked with pt. He will stop Losartan and start Amlodipine 2.5mg  daily. Marca Ancona, MD 04/11/2011 2:17 PM Signed  Stop losartan, replace with amlodipine 2.5 mg daily.  Ledon Snare, RN 04/11/2011 9:25 AM Signed  Pt calling stating he has been on losartin x 1 month and still feels extreme fatigue when taking this med--advised i would let dr Shirlee Latch know how med is effecting him and have Alwaleed Obeso or dr Shirlee Latch call him with new med or advise him on getting triglycerides down--pt agrees--nt  Pt is on Simvastatin 40mg  daily.

## 2011-04-15 MED ORDER — CYANOCOBALAMIN 1000 MCG/ML IJ SOLN
1000.0000 ug | Freq: Once | INTRAMUSCULAR | Status: AC
  Administered 2011-04-14: 1000 ug via INTRAMUSCULAR

## 2011-05-08 ENCOUNTER — Other Ambulatory Visit: Payer: Self-pay | Admitting: *Deleted

## 2011-05-08 DIAGNOSIS — I2510 Atherosclerotic heart disease of native coronary artery without angina pectoris: Secondary | ICD-10-CM

## 2011-05-08 DIAGNOSIS — I10 Essential (primary) hypertension: Secondary | ICD-10-CM

## 2011-05-08 MED ORDER — AMLODIPINE BESYLATE 2.5 MG PO TABS: 2.5000 mg | ORAL_TABLET | Freq: Every day | ORAL | Status: DC

## 2011-05-08 MED ORDER — ATORVASTATIN CALCIUM 20 MG PO TABS: 20.0000 mg | ORAL_TABLET | Freq: Every day | ORAL | Status: DC

## 2011-05-15 ENCOUNTER — Ambulatory Visit (INDEPENDENT_AMBULATORY_CARE_PROVIDER_SITE_OTHER): Payer: Medicare Other | Admitting: *Deleted

## 2011-05-15 DIAGNOSIS — E538 Deficiency of other specified B group vitamins: Secondary | ICD-10-CM

## 2011-05-15 MED ORDER — CYANOCOBALAMIN 1000 MCG/ML IJ SOLN
1000.0000 ug | Freq: Once | INTRAMUSCULAR | Status: AC
  Administered 2011-05-15: 1000 ug via INTRAMUSCULAR

## 2011-06-13 ENCOUNTER — Ambulatory Visit (INDEPENDENT_AMBULATORY_CARE_PROVIDER_SITE_OTHER)
Admission: RE | Admit: 2011-06-13 | Discharge: 2011-06-13 | Disposition: A | Discharge status: Home / Self Care | Payer: Medicare Other | Source: Ambulatory Visit | Attending: Internal Medicine | Admitting: Internal Medicine

## 2011-06-13 ENCOUNTER — Encounter: Payer: Self-pay | Admitting: Internal Medicine

## 2011-06-13 ENCOUNTER — Ambulatory Visit (INDEPENDENT_AMBULATORY_CARE_PROVIDER_SITE_OTHER): Payer: Medicare Other | Admitting: Internal Medicine

## 2011-06-13 ENCOUNTER — Ambulatory Visit: Payer: Medicare Other | Admitting: Internal Medicine

## 2011-06-13 VITALS — BP 130/80 | HR 59 | Temp 98.4°F | Resp 14 | Wt 221.0 lb

## 2011-06-13 DIAGNOSIS — S93509A Unspecified sprain of unspecified toe(s), initial encounter: Secondary | ICD-10-CM

## 2011-06-13 DIAGNOSIS — S93609A Unspecified sprain of unspecified foot, initial encounter: Secondary | ICD-10-CM

## 2011-06-13 DIAGNOSIS — D510 Vitamin B12 deficiency anemia due to intrinsic factor deficiency: Secondary | ICD-10-CM

## 2011-06-13 NOTE — Patient Instructions (Signed)
Foot elevation Avoid motrin-advil-"BCs" or similar meds  Ok Tylenol for pain as needed Call if no better in 1 week or if worse

## 2011-06-13 NOTE — Progress Notes (Signed)
  Subjective:    Patient ID: [name], male    DOB: [birth year], 65 y.o.   MRN: [id number]  HPI Patient bumped his right great toe last week, 5 days ago it started to hurt and swell up on and off. Swelling decreased after sleeping; today the swelling has been more persistent. Needs a B12 shot  Past Medical History  Diagnosis Date  . Hypertension   . Hyperlipidemia   . Anemia, mild   . Vitamin B12 deficiency   . Prostate cancer   . Skin cancer     [id number]  . CAD (coronary artery disease)   . Renal insufficiency    Past Surgical History  Procedure Date  . Cholecystectomy   . Prostatectomy     Dr. Vernie Ammons  . Knee arthroscopy     right  . Tonsillectomy   . Mrca stent 02/2010  . Melanoma excision 2012    L chest     Review of Systems Denies any history of gout, no fever, no old or joints are affected.     Objective:   Physical Exam  Constitutional: He appears well-developed and well-nourished.  Cardiovascular:       Good pedal pulses bilaterally  Musculoskeletal: He exhibits no edema.       Feet:          Assessment & Plan:

## 2011-06-13 NOTE — Assessment & Plan Note (Signed)
Sx likely from sprain, doubt gout See instructions

## 2011-06-15 NOTE — Assessment & Plan Note (Signed)
B12 shot today. 

## 2011-06-16 ENCOUNTER — Ambulatory Visit: Payer: Medicare Other

## 2011-07-14 ENCOUNTER — Ambulatory Visit (INDEPENDENT_AMBULATORY_CARE_PROVIDER_SITE_OTHER): Payer: Medicare Other

## 2011-07-14 DIAGNOSIS — D519 Vitamin B12 deficiency anemia, unspecified: Secondary | ICD-10-CM

## 2011-07-14 DIAGNOSIS — D518 Other vitamin B12 deficiency anemias: Secondary | ICD-10-CM

## 2011-07-14 MED ORDER — CYANOCOBALAMIN 1000 MCG/ML IJ SOLN
1000.0000 ug | Freq: Once | INTRAMUSCULAR | Status: AC
  Administered 2011-07-14: 1000 ug via INTRAMUSCULAR

## 2011-08-14 ENCOUNTER — Ambulatory Visit (INDEPENDENT_AMBULATORY_CARE_PROVIDER_SITE_OTHER): Payer: Medicare Other | Admitting: *Deleted

## 2011-08-14 DIAGNOSIS — E538 Deficiency of other specified B group vitamins: Secondary | ICD-10-CM

## 2011-08-14 MED ORDER — CYANOCOBALAMIN 1000 MCG/ML IJ SOLN
1000.0000 ug | Freq: Once | INTRAMUSCULAR | Status: AC
  Administered 2011-08-14: 1000 ug via INTRAMUSCULAR

## 2011-09-04 NOTE — Telephone Encounter (Signed)
Please close encounter

## 2011-09-12 ENCOUNTER — Ambulatory Visit: Payer: Medicare Other

## 2011-09-12 DIAGNOSIS — D519 Vitamin B12 deficiency anemia, unspecified: Secondary | ICD-10-CM

## 2011-09-12 DIAGNOSIS — D518 Other vitamin B12 deficiency anemias: Secondary | ICD-10-CM

## 2011-09-12 MED ORDER — CYANOCOBALAMIN 1000 MCG/ML IJ SOLN
1000.0000 ug | Freq: Once | INTRAMUSCULAR | Status: AC
  Administered 2011-09-12: 1000 ug via INTRAMUSCULAR

## 2011-10-09 ENCOUNTER — Other Ambulatory Visit: Payer: Self-pay | Admitting: Cardiology

## 2011-10-10 ENCOUNTER — Other Ambulatory Visit: Payer: Self-pay | Admitting: Cardiology

## 2011-10-13 ENCOUNTER — Ambulatory Visit: Payer: Medicare Other

## 2011-10-15 ENCOUNTER — Ambulatory Visit (INDEPENDENT_AMBULATORY_CARE_PROVIDER_SITE_OTHER): Payer: Medicare Other | Admitting: *Deleted

## 2011-10-15 DIAGNOSIS — D510 Vitamin B12 deficiency anemia due to intrinsic factor deficiency: Secondary | ICD-10-CM

## 2011-10-15 MED ORDER — CYANOCOBALAMIN 1000 MCG/ML IJ SOLN
1000.0000 ug | Freq: Once | INTRAMUSCULAR | Status: AC
  Administered 2011-10-15: 1000 ug via INTRAMUSCULAR

## 2011-11-17 ENCOUNTER — Ambulatory Visit (INDEPENDENT_AMBULATORY_CARE_PROVIDER_SITE_OTHER): Payer: Medicare Other | Admitting: *Deleted

## 2011-11-17 DIAGNOSIS — E538 Deficiency of other specified B group vitamins: Secondary | ICD-10-CM

## 2011-11-17 MED ORDER — CYANOCOBALAMIN 1000 MCG/ML IJ SOLN
1000.0000 ug | Freq: Once | INTRAMUSCULAR | Status: AC
  Administered 2011-11-17: 1000 ug via INTRAMUSCULAR

## 2011-12-15 ENCOUNTER — Ambulatory Visit: Payer: Medicare Other

## 2011-12-17 ENCOUNTER — Ambulatory Visit (INDEPENDENT_AMBULATORY_CARE_PROVIDER_SITE_OTHER): Payer: Medicare Other

## 2011-12-17 DIAGNOSIS — D519 Vitamin B12 deficiency anemia, unspecified: Secondary | ICD-10-CM

## 2011-12-17 DIAGNOSIS — D518 Other vitamin B12 deficiency anemias: Secondary | ICD-10-CM

## 2011-12-17 MED ORDER — CYANOCOBALAMIN 1000 MCG/ML IJ SOLN
1000.0000 ug | Freq: Once | INTRAMUSCULAR | Status: AC
  Administered 2011-12-17: 1000 ug via INTRAMUSCULAR

## 2012-01-19 ENCOUNTER — Ambulatory Visit (INDEPENDENT_AMBULATORY_CARE_PROVIDER_SITE_OTHER): Payer: Medicare Other

## 2012-01-19 ENCOUNTER — Ambulatory Visit: Payer: Medicare Other

## 2012-01-19 DIAGNOSIS — D518 Other vitamin B12 deficiency anemias: Secondary | ICD-10-CM

## 2012-01-19 DIAGNOSIS — D519 Vitamin B12 deficiency anemia, unspecified: Secondary | ICD-10-CM

## 2012-01-19 MED ORDER — CYANOCOBALAMIN 1000 MCG/ML IJ SOLN
1000.0000 ug | Freq: Once | INTRAMUSCULAR | Status: AC
  Administered 2012-01-19: 1000 ug via INTRAMUSCULAR

## 2012-02-18 ENCOUNTER — Ambulatory Visit (INDEPENDENT_AMBULATORY_CARE_PROVIDER_SITE_OTHER): Payer: Medicare Other | Admitting: *Deleted

## 2012-02-18 DIAGNOSIS — E538 Deficiency of other specified B group vitamins: Secondary | ICD-10-CM

## 2012-02-18 MED ORDER — CYANOCOBALAMIN 1000 MCG/ML IJ SOLN
1000.0000 ug | Freq: Once | INTRAMUSCULAR | Status: AC
  Administered 2012-02-18: 1000 ug via INTRAMUSCULAR

## 2012-03-17 ENCOUNTER — Ambulatory Visit (INDEPENDENT_AMBULATORY_CARE_PROVIDER_SITE_OTHER): Payer: Medicare Other | Admitting: *Deleted

## 2012-03-17 DIAGNOSIS — D518 Other vitamin B12 deficiency anemias: Secondary | ICD-10-CM

## 2012-03-17 MED ORDER — CYANOCOBALAMIN 1000 MCG/ML IJ SOLN
1000.0000 ug | Freq: Once | INTRAMUSCULAR | Status: AC
  Administered 2012-03-17: 1000 ug via INTRAMUSCULAR

## 2012-03-31 IMAGING — CR DG CHEST 1V PORT
1 series · 1 of 1 positions shown · non-contrast
Comparison: 11/29/2009

CLINICAL DATA: Chest pain

PORTABLE CHEST - 1 VIEW

[AP]
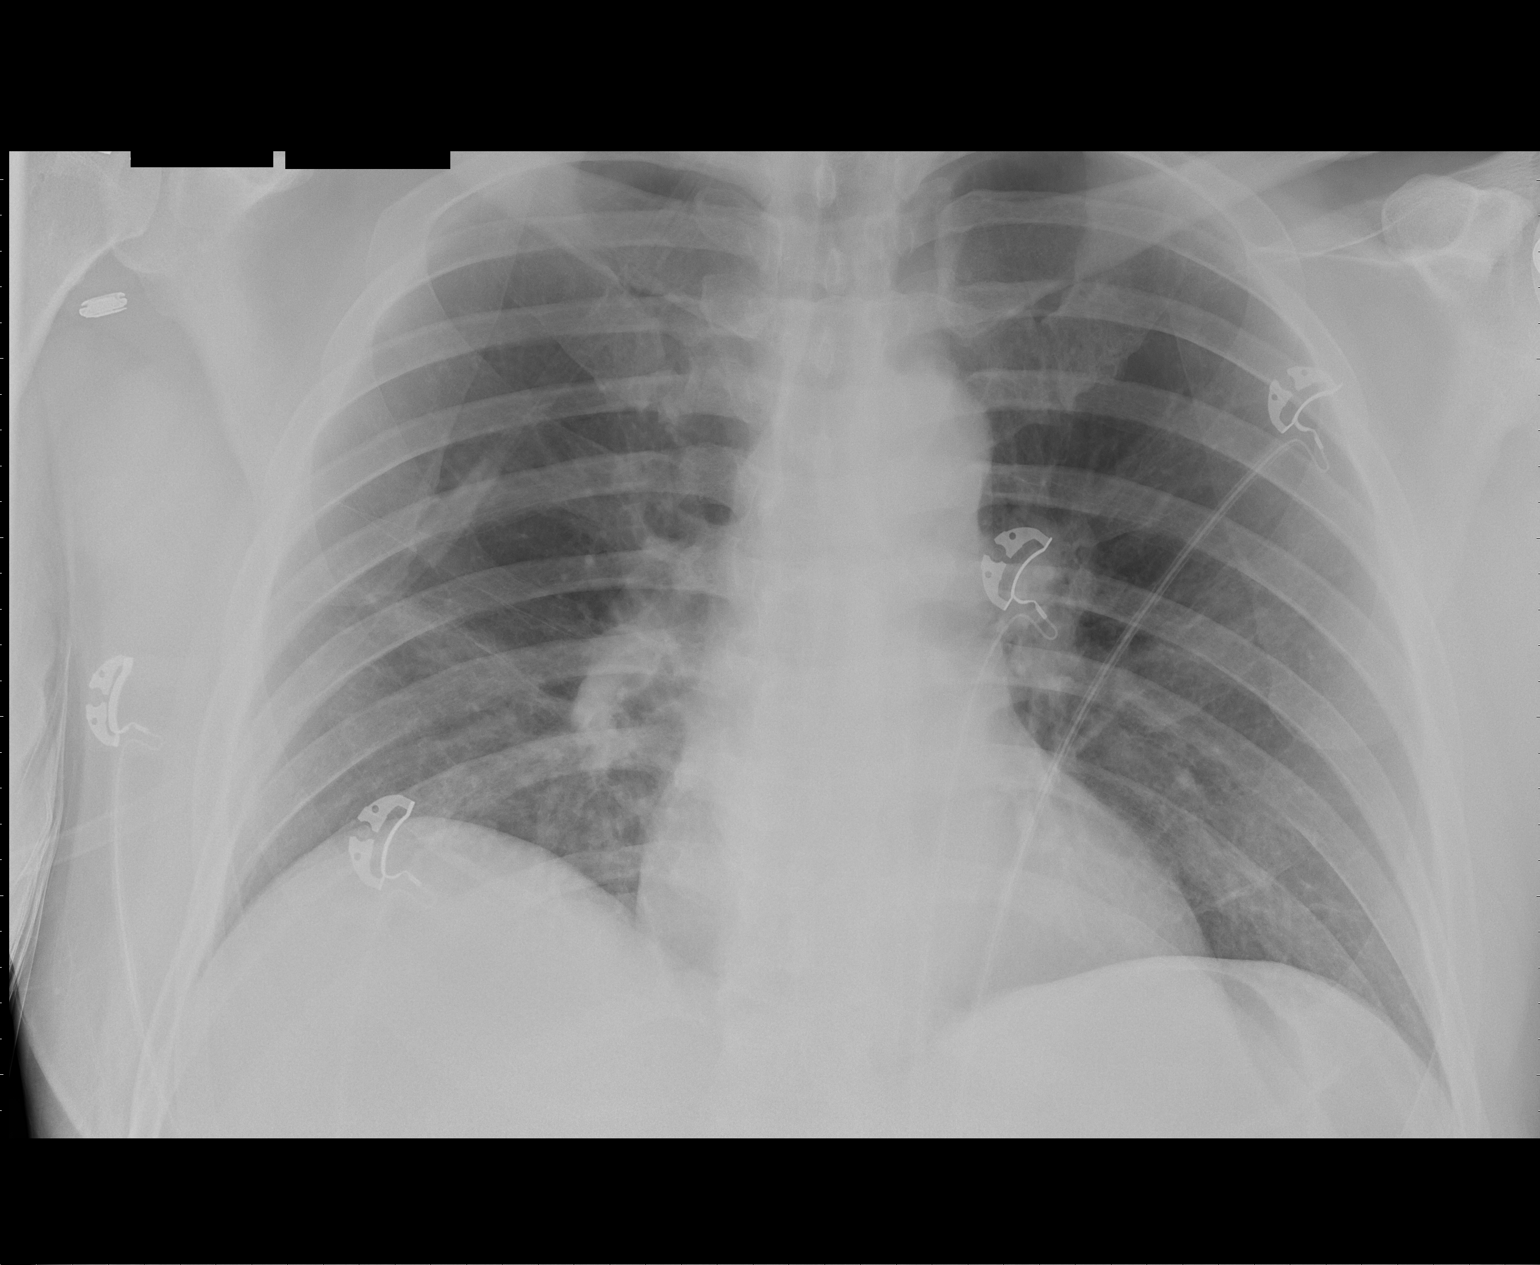

[1 of 1 positions shown; findings below may reference images not displayed]

FINDINGS: Heart and vascularity normal.  Question subtle right
middle or right lower lobe airspace density.  Recommend PA and
lateral chest x-ray, when possible, for further assessment.  No
pleural fluid in one-view.
IMPRESSION: Cannot exclude occult right middle or right lower lobe airspace
density.  Recommend PA and lateral chest x-ray when possible.

## 2012-04-16 ENCOUNTER — Ambulatory Visit (INDEPENDENT_AMBULATORY_CARE_PROVIDER_SITE_OTHER): Payer: Medicare Other

## 2012-04-16 DIAGNOSIS — D519 Vitamin B12 deficiency anemia, unspecified: Secondary | ICD-10-CM

## 2012-04-16 DIAGNOSIS — D518 Other vitamin B12 deficiency anemias: Secondary | ICD-10-CM

## 2012-04-16 MED ORDER — CYANOCOBALAMIN 1000 MCG/ML IJ SOLN
1000.0000 ug | Freq: Once | INTRAMUSCULAR | Status: AC
  Administered 2012-04-16: 1000 ug via INTRAMUSCULAR

## 2012-05-17 ENCOUNTER — Ambulatory Visit (INDEPENDENT_AMBULATORY_CARE_PROVIDER_SITE_OTHER): Payer: Medicare Other

## 2012-05-17 DIAGNOSIS — D518 Other vitamin B12 deficiency anemias: Secondary | ICD-10-CM

## 2012-05-17 DIAGNOSIS — D519 Vitamin B12 deficiency anemia, unspecified: Secondary | ICD-10-CM

## 2012-05-17 MED ORDER — CYANOCOBALAMIN 1000 MCG/ML IJ SOLN
1000.0000 ug | Freq: Once | INTRAMUSCULAR | Status: AC
  Administered 2012-05-17: 1000 ug via INTRAMUSCULAR

## 2012-06-17 ENCOUNTER — Ambulatory Visit (INDEPENDENT_AMBULATORY_CARE_PROVIDER_SITE_OTHER): Payer: Medicare Other

## 2012-06-17 DIAGNOSIS — D519 Vitamin B12 deficiency anemia, unspecified: Secondary | ICD-10-CM

## 2012-06-17 DIAGNOSIS — D518 Other vitamin B12 deficiency anemias: Secondary | ICD-10-CM

## 2012-06-17 MED ORDER — CYANOCOBALAMIN 1000 MCG/ML IJ SOLN
1000.0000 ug | Freq: Once | INTRAMUSCULAR | Status: AC
  Administered 2012-06-17: 1000 ug via INTRAMUSCULAR

## 2012-07-10 ENCOUNTER — Other Ambulatory Visit: Payer: Self-pay | Admitting: Cardiology

## 2012-07-12 NOTE — Telephone Encounter (Signed)
Pt informed of need to make appt with Dr. Alford Highland office in [place]. He will call them now and I will send in refills x 1

## 2012-07-12 NOTE — Telephone Encounter (Signed)
Pt past due for appt with cardiology

## 2012-07-16 ENCOUNTER — Ambulatory Visit (INDEPENDENT_AMBULATORY_CARE_PROVIDER_SITE_OTHER): Payer: Medicare Other | Admitting: *Deleted

## 2012-07-16 DIAGNOSIS — E538 Deficiency of other specified B group vitamins: Secondary | ICD-10-CM

## 2012-07-16 MED ORDER — CYANOCOBALAMIN 1000 MCG/ML IJ SOLN
1000.0000 ug | Freq: Once | INTRAMUSCULAR | Status: AC
  Administered 2012-07-16: 1000 ug via INTRAMUSCULAR

## 2012-07-19 ENCOUNTER — Ambulatory Visit (INDEPENDENT_AMBULATORY_CARE_PROVIDER_SITE_OTHER): Payer: Medicare Other | Admitting: Physician Assistant

## 2012-07-19 ENCOUNTER — Encounter: Payer: Self-pay | Admitting: Physician Assistant

## 2012-07-19 VITALS — BP 128/76 | HR 64 | Resp 18 | Ht 76.0 in | Wt 217.8 lb

## 2012-07-19 DIAGNOSIS — I10 Essential (primary) hypertension: Secondary | ICD-10-CM

## 2012-07-19 DIAGNOSIS — E785 Hyperlipidemia, unspecified: Secondary | ICD-10-CM

## 2012-07-19 DIAGNOSIS — D510 Vitamin B12 deficiency anemia due to intrinsic factor deficiency: Secondary | ICD-10-CM

## 2012-07-19 DIAGNOSIS — I25810 Atherosclerosis of coronary artery bypass graft(s) without angina pectoris: Secondary | ICD-10-CM

## 2012-07-19 DIAGNOSIS — C61 Malignant neoplasm of prostate: Secondary | ICD-10-CM

## 2012-07-19 NOTE — Progress Notes (Signed)
[address] [place], [place]  [address] Phone: [phone] Fax:  [phone]  Date:  07/19/2012   Name:  [name]   DOB:  Apr 12, 1946   MRN:  [id number]  PCP:  Marga Melnick, MD  Primary Cardiologist:  Dr. Marca Ancona  Primary Electrophysiologist:  None    History of Present Illness: [name] is a 66 y.o. male who returns for follow up.  He has a history of CAD, status post DES to the mid RCA in the setting of non-STEMI 02/2010, preserved LV function, HTN, HL, renal insufficiency.  Last seen by Dr. Shirlee Latch in 6/12. Plavix was stopped at that time. Losartan was added for blood pressure.  He is now off Losartan and is taking Amlodipine.  He is doing well.  He feels tired.  This is chronic without much change.  He denies chest pain, syncope, orthopnea, PND, edema or significant dyspnea.    Labs (8/11):  K 4.4, creatinine 1.5, ALT 18, Hgb 13.4, TSH 2.56 Labs (1/12):  LDL 76.2 Labs (6/12):  K 4.4, creatinine 1.4   Wt Readings from Last 3 Encounters:  06/13/11 221 lb (100.245 kg)  03/18/11 220 lb (99.791 kg)  11/04/10 221 lb 12.8 oz (100.608 kg)     Past Medical History  Diagnosis Date  . Hypertension     did not tolerate Lisinopril event at low dose  . Hyperlipidemia   . Anemia, mild   . Vitamin B12 deficiency   . Prostate cancer   . Skin cancer     [id number]  . CAD (coronary artery disease)     NSTEMI 5/11.  LHC showed 60% prox LAD, 70% mid LAD, 95% mid RCA with TIMI 2 flow distally, EF 60%.  Patient had PR[facility] DES to [facility].  Marland Kitchen Renal insufficiency     creatinine 1.5  . Hx of echocardiogram     Echo 6/11:  mild LVH, EF 55-60%, Gr 1 diast dysfn, Trivial MR, mild LAE    Current Outpatient Prescriptions  Medication Sig Dispense Refill  . amLODipine (NORVASC) 2.5 MG tablet TAKE 1 TABLET BY MOUTH EVERY DAY  30 tablet  1  . aspirin EC 81 MG tablet Take two tablets daily      . atorvastatin (LIPITOR) 20 MG tablet TAKE 1 TABLET BY MOUTH EVERY  DAY  30 tablet  1  . cyanocobalamin (,VITAMIN B-12,) 1000 MCG/ML injection Inject 1,000 mcg into the muscle every 30 (thirty) days.        . fish oil-omega-3 fatty acids 1000 MG capsule Take 2 g by mouth 2 (two) times daily.        . metoprolol (LOPRESSOR) 50 MG tablet TAKE 1 TABLET TWICE A DAY  60 tablet  1  . Potassium Chloride (K+ POTASSIUM PO) Take by mouth daily.        Marland Kitchen DISCONTD: atorvastatin (LIPITOR) 20 MG tablet TAKE 1 TABLET BY MOUTH EVERY DAY  30 tablet  6    Allergies: Allergies  Allergen Reactions  . Lidocaine     History  Substance Use Topics  . Smoking status: Never Smoker   . Smokeless tobacco: Not on file  . Alcohol Use: No     ROS:  Please see the history of present illness.     All other systems reviewed and negative.   PHYSICAL EXAM: VS:  BP 128/76  Pulse 64  Resp 18  Ht 6\' 4"  (1.93 m)  Wt 217 lb 12.8 oz (98.793  kg)  BMI 26.51 kg/m2  SpO2 92% Well nourished, well developed, in no acute distress HEENT: normal Neck: no JVD Endocrine: No thyromegaly Cardiac:  normal S1, S2; RRR; no murmur Lungs:  clear to auscultation bilaterally, no wheezing, rhonchi or rales Abd: soft, nontender, no hepatomegaly Ext: no edema Skin: warm and dry Neuro:  CNs 2-12 intact, no focal abnormalities noted  EKG:  NSR, HR 65, normal axis, nonspecific ST-T wave changes      ASSESSMENT AND PLAN:  1. Coronary Artery Disease: Stable. Continue aspirin and statin. Followup with Dr. Shirlee Latch in 1 year or sooner as needed.  2. Hypertension: Controlled. Continue current therapy. Check a basic metabolic panel.  3. Hyperlipidemia: Arrange fasting lipids and LFTs. Continue current dose of statin.  4. Fatigue: This is a fairly chronic symptom for him. He does have a history of B12 deficiency. It has been quite some time since a CBC or TSH was checked. I will obtain these as well.  5. Prostate Cancer: He plans to arrange a physical with his PCP soon. As I am checking other blood work,  I will go ahead and obtain his PSA. He will arrange followup with his PCP for annual physical.  Signed, Tereso Newcomer, PA-C  11:21 AM 07/19/2012

## 2012-07-19 NOTE — Patient Instructions (Addendum)
Your physician wants you to follow-up in: 12 MONTHS WITH DR. Shirlee Latch. You will receive a reminder letter in the mail two months in advance. If you don't receive a letter, please call our office to schedule the follow-up appointment.   Your physician recommends that you return for lab work in: 07/20/12 FOR BMET, CBC W/DIFF, TSH, PSA, LFT, LIPID

## 2012-07-20 ENCOUNTER — Other Ambulatory Visit: Payer: Self-pay | Admitting: *Deleted

## 2012-07-20 ENCOUNTER — Other Ambulatory Visit (INDEPENDENT_AMBULATORY_CARE_PROVIDER_SITE_OTHER): Payer: Medicare Other

## 2012-07-20 DIAGNOSIS — E785 Hyperlipidemia, unspecified: Secondary | ICD-10-CM

## 2012-07-20 DIAGNOSIS — I10 Essential (primary) hypertension: Secondary | ICD-10-CM

## 2012-07-20 DIAGNOSIS — D510 Vitamin B12 deficiency anemia due to intrinsic factor deficiency: Secondary | ICD-10-CM

## 2012-07-20 DIAGNOSIS — C61 Malignant neoplasm of prostate: Secondary | ICD-10-CM

## 2012-07-20 LAB — CBC WITH DIFFERENTIAL/PLATELET
Basophils Absolute: 0 10*3/uL (ref 0.0–0.1)
Eosinophils Absolute: 0.2 10*3/uL (ref 0.0–0.7)
Eosinophils Relative: 4.2 % (ref 0.0–5.0)
MCV: 84.6 fl (ref 78.0–100.0)
Monocytes Absolute: 0.4 10*3/uL (ref 0.1–1.0)
Neutrophils Relative %: 57.2 % (ref 43.0–77.0)
Platelets: 163 10*3/uL (ref 150.0–400.0)
WBC: 5.7 10*3/uL (ref 4.5–10.5)

## 2012-07-20 LAB — LIPID PANEL
HDL: 33.3 mg/dL — ABNORMAL LOW (ref >39.00)
Triglycerides: 226 mg/dL — ABNORMAL HIGH (ref 0.0–149.0)

## 2012-07-20 LAB — HEPATIC FUNCTION PANEL
ALT: 20 U/L (ref 0–53)
Bilirubin, Direct: 0.1 mg/dL (ref 0.0–0.3)
Total Protein: 6.6 g/dL (ref 6.0–8.3)

## 2012-07-20 LAB — BASIC METABOLIC PANEL
GFR: 47.24 mL/min — ABNORMAL LOW (ref >60.00)
Glucose, Bld: 97 mg/dL (ref 70–99)
Potassium: 4.3 mEq/L (ref 3.5–5.1)
Sodium: 139 mEq/L (ref 135–145)

## 2012-07-20 LAB — LDL CHOLESTEROL, DIRECT: Direct LDL: 68.8 mg/dL

## 2012-07-20 LAB — PSA: PSA: 0.01 ng/mL — ABNORMAL LOW (ref 0.10–4.00)

## 2012-07-20 MED ORDER — AMLODIPINE BESYLATE 2.5 MG PO TABS: 2.5000 mg | ORAL_TABLET | Freq: Every day | ORAL | Status: DC

## 2012-07-20 MED ORDER — ATORVASTATIN CALCIUM 20 MG PO TABS: 20.0000 mg | ORAL_TABLET | Freq: Every day | ORAL | Status: DC

## 2012-07-20 MED ORDER — METOPROLOL TARTRATE 50 MG PO TABS: 50.0000 mg | ORAL_TABLET | Freq: Two times a day (BID) | ORAL | Status: DC

## 2012-07-22 ENCOUNTER — Telehealth: Payer: Self-pay | Admitting: *Deleted

## 2012-07-22 NOTE — Telephone Encounter (Signed)
lmptcb to go over lab results 

## 2012-07-22 NOTE — Telephone Encounter (Signed)
Message copied by Tarri Fuller on Thu Jul 22, 2012 10:38 AM ------      Message from: [place], [place] T      Created: Wed Jul 21, 2012 11:16 AM       PSA looks ok      LDL ok but Trigs a little on high side with low HDL => make sure he is still taking Fish Oil 2000 mg bid.  If not, restart.  If he is, watch diet (low sat fats, lower carbs).      Hgb ok      TSH ok      LFTs ok      Tereso Newcomer, PA-C  11:16 AM 07/21/2012

## 2012-07-23 ENCOUNTER — Telehealth: Payer: Self-pay | Admitting: *Deleted

## 2012-07-23 NOTE — Telephone Encounter (Signed)
Pt notified about lab results. Pt states he is only taking fish oil 1000 bid. I advised pt to increase to fish oil 2000 mg bid and to watch sat fats/carbs in diet

## 2012-07-23 NOTE — Telephone Encounter (Signed)
Follow-up:    Patient returned your call about his lab work.  Please call back.

## 2012-07-23 NOTE — Telephone Encounter (Signed)
Pt notified about lab results. Pt states he is only taking fish oil 1000 bid. I advised pt to increase to fish oil 2000 mg bid and to watch sat fats/carbs in diet 

## 2012-08-16 ENCOUNTER — Ambulatory Visit (INDEPENDENT_AMBULATORY_CARE_PROVIDER_SITE_OTHER): Payer: Medicare Other

## 2012-08-16 DIAGNOSIS — D518 Other vitamin B12 deficiency anemias: Secondary | ICD-10-CM

## 2012-08-16 DIAGNOSIS — D519 Vitamin B12 deficiency anemia, unspecified: Secondary | ICD-10-CM

## 2012-08-16 MED ORDER — CYANOCOBALAMIN 1000 MCG/ML IJ SOLN
1000.0000 ug | Freq: Once | INTRAMUSCULAR | Status: AC
  Administered 2012-08-16: 1000 ug via INTRAMUSCULAR

## 2012-09-16 ENCOUNTER — Ambulatory Visit (INDEPENDENT_AMBULATORY_CARE_PROVIDER_SITE_OTHER): Payer: Medicare Other

## 2012-09-16 DIAGNOSIS — D518 Other vitamin B12 deficiency anemias: Secondary | ICD-10-CM

## 2012-09-16 DIAGNOSIS — D519 Vitamin B12 deficiency anemia, unspecified: Secondary | ICD-10-CM

## 2012-09-16 MED ORDER — CYANOCOBALAMIN 1000 MCG/ML IJ SOLN
1000.0000 ug | Freq: Once | INTRAMUSCULAR | Status: AC
  Administered 2012-09-16: 1000 ug via INTRAMUSCULAR

## 2012-10-18 ENCOUNTER — Ambulatory Visit (INDEPENDENT_AMBULATORY_CARE_PROVIDER_SITE_OTHER): Payer: Medicare Other

## 2012-10-18 DIAGNOSIS — D518 Other vitamin B12 deficiency anemias: Secondary | ICD-10-CM

## 2012-10-18 DIAGNOSIS — D519 Vitamin B12 deficiency anemia, unspecified: Secondary | ICD-10-CM

## 2012-10-18 MED ORDER — CYANOCOBALAMIN 1000 MCG/ML IJ SOLN
1000.0000 ug | Freq: Once | INTRAMUSCULAR | Status: AC
  Administered 2012-10-18: 1000 ug via INTRAMUSCULAR

## 2012-11-18 ENCOUNTER — Ambulatory Visit: Payer: Medicare Other

## 2012-11-23 ENCOUNTER — Ambulatory Visit (INDEPENDENT_AMBULATORY_CARE_PROVIDER_SITE_OTHER): Payer: Medicare Other | Admitting: *Deleted

## 2012-11-23 DIAGNOSIS — E538 Deficiency of other specified B group vitamins: Secondary | ICD-10-CM

## 2012-11-23 MED ORDER — CYANOCOBALAMIN 1000 MCG/ML IJ SOLN
1000.0000 ug | Freq: Once | INTRAMUSCULAR | Status: AC
  Administered 2012-11-23: 1000 ug via INTRAMUSCULAR

## 2013-01-15 ENCOUNTER — Other Ambulatory Visit: Payer: Self-pay | Admitting: Cardiology

## 2013-05-30 ENCOUNTER — Encounter: Payer: Self-pay | Admitting: Nurse Practitioner

## 2013-05-30 ENCOUNTER — Ambulatory Visit (INDEPENDENT_AMBULATORY_CARE_PROVIDER_SITE_OTHER): Payer: Medicare Other | Admitting: Nurse Practitioner

## 2013-05-30 VITALS — BP 140/70 | HR 74 | Temp 99.9°F | Ht 76.0 in | Wt 220.4 lb

## 2013-05-30 DIAGNOSIS — A46 Erysipelas: Secondary | ICD-10-CM

## 2013-05-30 MED ORDER — PENICILLIN V POTASSIUM 500 MG PO TABS: 500.0000 mg | ORAL_TABLET | Freq: Four times a day (QID) | ORAL | Status: DC

## 2013-05-30 NOTE — Patient Instructions (Signed)
Start antibiotic as prescribed.  Erysipelas Erysipelas is a sudden form of cellulitis (inflammation of the cells) that affects the tissues near the skin surface. It is most often caused by a streptococcal or staphylococcal (germ) infection. SYMPTOMS Erysipelas begins as just not feeling well (malaise), chills, and a fever of usually 101 F (38.3 C) to 104 F (40 C). Being it is an inflammation (soreness) of the skin and the tissue just beneath the skin; it shows up as a reddened area with sharp borders. It may be streaked because the lymphatics are infected. These are lymph channels that flow out of your glands (lymph nodes), like the glands in your neck. The reddened area may be tender to touch with itching and burning of the skin. Sometimes this is accompanied by feelings of nausea (you are sick to your stomach) and vomiting (throwing up). Sometimes there may be a break in the skin over the reddened area which is where the bacteria (germs) entered the body. Sometimes there may not appear to be a site of entry. The most common area for erysipelas to appear is on the lower legs. When the legs are infected, it is usually the glands (lymph nodes) in the groin that may be enlarged and tender. DIAGNOSIS  Your caregiver most often bases the diagnosis (learning what is wrong) on your physical findings (examination). It is often hard to grow the germs that produce this illness. Sometimes blood cultures (to see what germs may be growing in your blood) will be done if there is a high fever and the blood cultures are likely to be positive. This means the culture is able to grow the bacteria (germ) producing the erysipelas. If blood counts are done, the white blood count is usually elevated. The ESR (erythrocyte sedimentation rate) is also usually elevated (higher than normal). The ESR is just a nonspecific sign of infection being present. TREATMENT  This infection usually responds rapidly to medications which kill  germs (antibiotics). Depending on findings and course of the illness (gets better or worse), your caregiver will be able to decide which is the best possible treatment for you. Most often these infections respond well to penicillin in individuals not allergic to penicillin. Other alternatives are available for those who cannot take penicillin. HOME CARE INSTRUCTIONS   You may return to work as directed.  Only take over-the-counter or prescription medicines for pain, discomfort, or fever as directed by your caregiver.  Finish all antibiotics as prescribed by your caregiver even if it looks as if the infection has cleared completely. SEEK MEDICAL CARE IF:   Your chills and feelings of illness are getting worse.  You have pain or discomfort not controlled by medications, or if symptoms seem to be getting worse rather than better.  The reddened area of infection seems to be spreading rather than getting smaller, red lines are extending away from the infection toward your chest or abdomen, or a part of the infection begins to turn dark in color.  The problem returns in the same or another area after it seems to have gone away. MAKE SURE YOU:   Understand these instructions.  Will watch your condition.  Will get help right away if you are not doing well or get worse. Document Released: 06/17/2001 Document Revised: 12/15/2011 Document Reviewed: 05/10/2008 [facility] Patient Information 2014 [place], [place].

## 2013-06-01 NOTE — Progress Notes (Signed)
  Subjective:    Patient ID: [name], male    DOB: Aug 11, 1946, 67 y.o.   MRN: [id number]  Ear Fullness  There is pain in the right ear. This is a new problem. Episode onset: 4 days ago had itchy R pinna, 2 da progressed to warmth & pain in pinna. The problem occurs constantly. The problem has been gradually worsening. There has been no fever. The pain is moderate. Associated symptoms include a rash (sometimes gets scaly itchy rash on ears, has used steroid cream in past with good resilts). Pertinent negatives include no abdominal pain, coughing, diarrhea, drainage, ear discharge, headaches, hearing loss, rhinorrhea, sore throat or vomiting. He has tried nothing for the symptoms. There is no history of a chronic ear infection, hearing loss or a tympanostomy tube.      Review of Systems  Constitutional: Negative for fever, chills, activity change and fatigue.  HENT: Positive for ear pain (R pinna is painful). Negative for hearing loss, congestion, sore throat, facial swelling, rhinorrhea, tinnitus and ear discharge.   Respiratory: Negative for cough.   Gastrointestinal: Negative for nausea, vomiting, abdominal pain and diarrhea.  Skin: Positive for rash (sometimes gets scaly itchy rash on ears, has used steroid cream in past with good resilts).  Allergic/Immunologic: Negative for environmental allergies.  Neurological: Negative for headaches.       Objective:   Physical Exam  Vitals reviewed. Constitutional: He is oriented to person, place, and time. He appears well-developed and well-nourished. No distress.  Low grade fever, states does not feel well.  HENT:  Head: Normocephalic and atraumatic.  Right Ear: Hearing, tympanic membrane and ear canal normal. There is swelling. No mastoid tenderness. Tympanic membrane is not injected, not perforated and not erythematous. No middle ear effusion.  Left Ear: Hearing, tympanic membrane, external ear and ear canal normal.   Ears:  Mouth/Throat: Oropharynx is clear and moist. No oropharyngeal exudate.  Neck: Normal range of motion. Neck supple. No tracheal deviation present. No thyromegaly present.  Cardiovascular: Normal rate, regular rhythm and normal heart sounds.   No murmur heard. Pulmonary/Chest: Effort normal and breath sounds normal.  Lymphadenopathy:    He has no cervical adenopathy.  Neurological: He is alert and oriented to person, place, and time.  Skin: Skin is warm and dry. There is erythema.  Psychiatric: He has a normal mood and affect. His behavior is normal. Thought content normal.          Assessment & Plan:  1. Erysipelas DD: cellulitis - penicillin v potassium (VEETID) 500 MG tablet; Take 1 tablet (500 mg total) by mouth 4 (four) times daily.  Dispense: 28 tablet; Refill: 0  See pt instructions.

## 2013-06-07 ENCOUNTER — Encounter: Payer: Self-pay | Admitting: Family Medicine

## 2013-06-07 ENCOUNTER — Ambulatory Visit (INDEPENDENT_AMBULATORY_CARE_PROVIDER_SITE_OTHER): Payer: Medicare Other | Admitting: Family Medicine

## 2013-06-07 VITALS — BP 136/70 | HR 71 | Temp 98.6°F | Wt 220.0 lb

## 2013-06-07 DIAGNOSIS — L02818 Cutaneous abscess of other sites: Secondary | ICD-10-CM | POA: Insufficient documentation

## 2013-06-07 NOTE — Progress Notes (Signed)
  Subjective:    Patient ID: [name], male    DOB: January 29, 1946, 66 y.o.   MRN: [id number]  HPI Pt here f/u cellulitis R ear.  Pt finished abx and states the symptoms have completely resolved.  No new complaints.     Review of Systems As above    Objective:   Physical Exam  BP 136/70  Pulse 71  Temp(Src) 98.6 F (37 C) (Oral)  Wt 220 lb (99.791 kg)  BMI 26.79 kg/m2  SpO2 97% General appearance: alert, cooperative, appears stated age and no distress Ears: normal TM's and external ear canals both ears       Assessment & Plan:

## 2013-06-07 NOTE — Assessment & Plan Note (Signed)
resolved 

## 2013-07-19 ENCOUNTER — Other Ambulatory Visit: Payer: Self-pay | Admitting: Cardiology

## 2013-07-20 ENCOUNTER — Encounter: Payer: Self-pay | Admitting: Cardiology

## 2013-07-20 ENCOUNTER — Ambulatory Visit (INDEPENDENT_AMBULATORY_CARE_PROVIDER_SITE_OTHER): Payer: Medicare Other | Admitting: Cardiology

## 2013-07-20 VITALS — BP 118/74 | HR 62 | Ht 76.0 in | Wt 219.0 lb

## 2013-07-20 DIAGNOSIS — I2510 Atherosclerotic heart disease of native coronary artery without angina pectoris: Secondary | ICD-10-CM

## 2013-07-20 DIAGNOSIS — E785 Hyperlipidemia, unspecified: Secondary | ICD-10-CM

## 2013-07-20 DIAGNOSIS — I10 Essential (primary) hypertension: Secondary | ICD-10-CM

## 2013-07-20 NOTE — Progress Notes (Signed)
Patient ID: [name], male   DOB: August 16, 1946, 67 y.o.   MRN: [id number] PCP: Dr. Alwyn Ren  67 yo with history of CAD s/p recent NSTEMI presents for followup.  Patient was admitted to [facility] in 5/11 with NSTEMI.  He had a PROMUS DES to the mid RCA.  He had residual moderate disease (60-70%) in the LAD that was not intervened upon.   EF was preserved on echo.  Since last appointment, patient has been doing well.  He has had no chest pain.  No exertional dyspnea.  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  His main complaint is low energy level/fatigue.  This has been going on for years now. He says that he snores but does not think that it is excessive and his wife has never told him that he gasps or stops breathing.  BP has been under control on current regimen.   Labs (5/11): creatinine 1.56, TGs 478, HDL 35, P2Y12 testing with PRU < 230 (goal) Labs (8/11): LDL 69, HDL 35, TGs 284, creatinine 1.5, TSH normal Labs (1/12): LDL 76, HDL 37, TGs 287 Labs (10/13): creatinine 1.6, LDL 69, HDL 33  ECG: NSR, normal  Allergies:  1)  ! * Hydroxyzine 2)  Lidocaine  Past Medical History: 1. Hypertension: Did not tolerate lisinopril even at low dose.  2. Hyperlipidemia 3. Mild anemia, B12 deficiency,PMH of 4. Prostate cancer, hx of 5. Skin cancer, PMH  of 6. S/P  TKR 7. CAD: NSTEMI 5/11.  LHC showed 60% prox LAD, 70% mid LAD, 95% mid RCA with TIMI 2 flow distally, EF 60%.  Patient had PROMUS DES to [facility].  8. CKD  Family History: mother :CAD S/P  CABG  in 49s, CVA father: prostate cancer; bro: HTN  Social History: Never Smoked Retired from [employer], works on his farm in [place] Alcohol use-no Regular exercise: physically active on farm Married  Current Outpatient Prescriptions  Medication Sig Dispense Refill  . amLODipine (NORVASC) 2.5 MG tablet TAKE 1 TABLET EVERY DAY  90 tablet  1  . aspirin EC 81 MG tablet Take two tablets daily      .  atorvastatin (LIPITOR) 20 MG tablet TAKE 1 TABLET BY MOUTH DAILY  90 tablet  1  . fish oil-omega-3 fatty acids 1000 MG capsule Take 2 g by mouth 2 (two) times daily.        . metoprolol (LOPRESSOR) 50 MG tablet TAKE 1 TABLET BY MOUTH TWICE A DAY  180 tablet  1  . Potassium Chloride (K+ POTASSIUM PO) Take by mouth daily.        . cyanocobalamin (,VITAMIN B-12,) 1000 MCG/ML injection Inject 1,000 mcg into the muscle every 30 (thirty) days.         No current facility-administered medications for this visit.    BP 118/74  Pulse 62  Ht 6\' 4"  (1.93 m)  Wt 99.338 kg (219 lb)  BMI 26.67 kg/m2 General:  Well developed, well nourished, in no acute distress. Neck:  Neck supple, no JVD. No masses, thyromegaly or abnormal cervical nodes. Lungs:  Clear bilaterally to auscultation and percussion. Heart:  Non-displaced PMI, chest non-tender; regular rate and rhythm, S1, S2 without murmurs, rubs. Soft S4. Carotid upstroke normal, no bruit. Pedals normal pulses. No edema, no varicosities. Abdomen:  Bowel sounds positive; abdomen soft and non-tender without masses, organomegaly, or hernias noted. No hepatosplenomegaly. Extremities:  No clubbing or cyanosis. Neurologic:  Alert and oriented  x 3. Psych:  Normal affect.  Assessment/Plan: 1. CAD: No ischemic symptoms. Continue ASA, statin, metoprolol. 2. HTN: BP is under good control on current regimen.  3. Hyperlipidemia: To get lipids next week at Dr [facility] office.  4. Fatigue: Chronic x years, no changes, does not screen positive for OSA, TSH and CBC normal in the past.   Marca Ancona 07/20/2013

## 2013-07-20 NOTE — Patient Instructions (Signed)
Your physician wants you to follow-up in: 1 year with Dr McLean. (October 2015). You will receive a reminder letter in the mail two months in advance. If you don't receive a letter, please call our office to schedule the follow-up appointment.  

## 2013-07-26 ENCOUNTER — Telehealth: Payer: Self-pay

## 2013-07-26 NOTE — Telephone Encounter (Signed)
Medication List and allergies: reviewed  90 day supply/mail order: none Local prescriptions: [facility] [address]  Immunizations due: flu (will think about) PNA, Zostavax  A/P:  Last PSA 07/18/2012 --0.01 consistent with last 4 years Per patient Dr Elberta Leatherwood advised to have PSA checked yearly Cardio needs labs; went last week/advised staff that he was getting CPE this week. 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

## 2013-07-28 ENCOUNTER — Encounter: Payer: Self-pay | Admitting: Internal Medicine

## 2013-07-28 ENCOUNTER — Ambulatory Visit (INDEPENDENT_AMBULATORY_CARE_PROVIDER_SITE_OTHER): Payer: Medicare Other | Admitting: Internal Medicine

## 2013-07-28 VITALS — BP 122/78 | HR 56 | Temp 98.7°F | Ht 76.0 in | Wt 219.0 lb

## 2013-07-28 DIAGNOSIS — Z8546 Personal history of malignant neoplasm of prostate: Secondary | ICD-10-CM

## 2013-07-28 DIAGNOSIS — Z125 Encounter for screening for malignant neoplasm of prostate: Secondary | ICD-10-CM

## 2013-07-28 DIAGNOSIS — I2510 Atherosclerotic heart disease of native coronary artery without angina pectoris: Secondary | ICD-10-CM

## 2013-07-28 DIAGNOSIS — E785 Hyperlipidemia, unspecified: Secondary | ICD-10-CM

## 2013-07-28 DIAGNOSIS — D510 Vitamin B12 deficiency anemia due to intrinsic factor deficiency: Secondary | ICD-10-CM

## 2013-07-28 DIAGNOSIS — I10 Essential (primary) hypertension: Secondary | ICD-10-CM

## 2013-07-28 DIAGNOSIS — Z0000 Encounter for general adult medical examination without abnormal findings: Secondary | ICD-10-CM

## 2013-07-28 LAB — HEPATIC FUNCTION PANEL
ALT: 21 U/L (ref 0–53)
AST: 21 U/L (ref 0–37)
Albumin: 4.3 g/dL (ref 3.5–5.2)
Alkaline Phosphatase: 69 U/L (ref 39–117)
Bilirubin, Direct: 0.1 mg/dL (ref 0.0–0.3)
Total Bilirubin: 0.6 mg/dL (ref 0.3–1.2)

## 2013-07-28 LAB — CBC WITH DIFFERENTIAL/PLATELET
Eosinophils Absolute: 0.4 10*3/uL (ref 0.0–0.7)
Eosinophils Relative: 7.3 % — ABNORMAL HIGH (ref 0.0–5.0)
HCT: 40.7 % (ref 39.0–52.0)
Lymphocytes Relative: 29.9 % (ref 12.0–46.0)
Lymphs Abs: 1.7 10*3/uL (ref 0.7–4.0)
MCHC: 34 g/dL (ref 30.0–36.0)
Monocytes Relative: 8 % (ref 3.0–12.0)
Neutro Abs: 3 10*3/uL (ref 1.4–7.7)
Neutrophils Relative %: 54.3 % (ref 43.0–77.0)
Platelets: 167 10*3/uL (ref 150.0–400.0)
RDW: 14.7 % — ABNORMAL HIGH (ref 11.5–14.6)
WBC: 5.6 10*3/uL (ref 4.5–10.5)

## 2013-07-28 LAB — PSA: PSA: 0 ng/mL — ABNORMAL LOW (ref 0.10–4.00)

## 2013-07-28 LAB — BASIC METABOLIC PANEL
BUN: 18 mg/dL (ref 6–23)
Calcium: 9.5 mg/dL (ref 8.4–10.5)
GFR: 50.44 mL/min — ABNORMAL LOW (ref >60.00)
Glucose, Bld: 105 mg/dL — ABNORMAL HIGH (ref 70–99)
Potassium: 4.6 mEq/L (ref 3.5–5.1)

## 2013-07-28 LAB — VITAMIN B12: Vitamin B-12: 623 pg/mL (ref 211–911)

## 2013-07-28 LAB — LIPID PANEL
HDL: 42.4 mg/dL (ref >39.00)
LDL Cholesterol: 75 mg/dL (ref 0–99)
Total CHOL/HDL Ratio: 3
VLDL: 26.8 mg/dL (ref 0.0–40.0)

## 2013-07-28 LAB — TSH: TSH: 3.26 u[IU]/mL (ref 0.35–5.50)

## 2013-07-28 NOTE — Progress Notes (Signed)
Subjective:    Patient ID: [name], male    DOB: 03-May-1946, 67 y.o.   MRN: [id number]  HPI Medicare Wellness Visit: Psychosocial and medical history were reviewed as required by Medicare (abuse, antisocial behavior risk, forearm risk). Social history: Caffeine: cola & tea occasionally , Alcohol: no , Tobacco use:no Exercise:see below Personal safety/fall risk:no Limitations of activities of daily living:no Seatbelt smoke alarm use:yes Healthcare Power of Attorney/Living Will status: needed Ophthalmologic exam status:due Hearing evaluation status: not current Orientation: Oriented X3 Memory and recall: good Spelling or math testing: good Depression/anxiety assessment: denied Foreign travel history:never Immunization status the shingles/bleeding/pneumonia/tetanus: Shingles needed Transfusion history:no Preventive health care maintenance status: Colonoscopy as per protocol/standard care: 2006 Dental care: every 6 mos Chart reviewed and updated. Active issues reviewed and addressed.    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 Specifically he denies chest pain, palpitations, dyspnea, or claudication. Family history is negative for premature coronary disease. With CAD his LDL goal is less than 70. There is medication compliance with the statin. Significant abdominal symptoms, memory deficit, or myalgias denied. BP @ home not monitored.     Objective:   Physical Exam Gen.: Healthy and well-nourished in appearance. Alert, appropriate and cooperative throughout exam.Appears younger than stated age  Head: Normocephalic without obvious abnormalities; pattern alopecia  Eyes: No corneal or conjunctival inflammation noted. Pupils equal round reactive to light and accommodation.  Extraocular motion intact.  Ears: External  ear exam reveals no significant lesions or deformities. Canals clear .TMs normal.  Hearing is grossly normal bilaterally. Nose: External nasal exam reveals no deformity or inflammation. Nasal mucosa are pink and moist. No lesions or exudates noted.   Mouth: Oral mucosa and oropharynx reveal no lesions or exudates. Teeth in good repair. Neck: No deformities, masses, or tenderness noted. Range of motion & Thyroid normal. Lungs: Normal respiratory effort; chest expands symmetrically. Lungs are clear to auscultation without rales, wheezes, or increased work of breathing. Heart: Slow rate and regular rhythm. Normal S1 and S2. No gallop, click, or rub. S4 w/o murmur. Abdomen: Bowel sounds normal; abdomen soft and nontender. No masses, organomegaly or hernias noted. Genitalia: Genitalia normal except for left varices. Prostate absent. Musculoskeletal/extremities: No deformity or scoliosis noted of  the thoracic or lumbar spine.  No clubbing, cyanosis, edema, or significant extremity  deformity noted. Range of motion normal .Tone & strength  Normal. Joints normal . Nail health good. Able to lie down & sit up w/o help. Negative SLR bilaterally Vascular: Carotid, radial artery, dorsalis pedis and  posterior tibial pulses are full and equal. No bruits present. Neurologic: Alert and oriented x3. Deep tendon reflexes symmetrical and normal.        Skin: Intact without suspicious lesions or rashes. Lymph: No cervical, axillary, or inguinal lymphadenopathy present. Psych: Mood and affect are normal. Normally interactive                                                                                        Assessment & Plan:  #1 Medicare Wellness Exam;  criteria met ; data entered #2 Problem List/Diagnoses reviewed Plan:  Assessments made/ Orders entered

## 2013-07-28 NOTE — Patient Instructions (Signed)

## 2013-07-29 ENCOUNTER — Encounter: Payer: Self-pay | Admitting: General Practice

## 2013-07-29 ENCOUNTER — Encounter: Payer: Medicare Other | Admitting: Internal Medicine

## 2013-08-03 ENCOUNTER — Ambulatory Visit: Payer: Medicare Other

## 2013-08-03 DIAGNOSIS — R7309 Other abnormal glucose: Secondary | ICD-10-CM

## 2013-08-04 ENCOUNTER — Encounter: Payer: Self-pay | Admitting: *Deleted

## 2013-08-04 NOTE — Progress Notes (Signed)
Letter mailed to patient.

## 2014-04-16 ENCOUNTER — Other Ambulatory Visit: Payer: Self-pay | Admitting: Cardiology

## 2014-06-03 ENCOUNTER — Encounter (HOSPITAL_COMMUNITY): Payer: Self-pay | Admitting: Emergency Medicine

## 2014-06-03 ENCOUNTER — Inpatient Hospital Stay (HOSPITAL_COMMUNITY)
Admission: EM | Admit: 2014-06-03 | Discharge: 2014-06-08 | DRG: 247 | Disposition: A | Discharge status: Home / Self Care | Payer: Medicare HMO | Attending: Internal Medicine | Admitting: Internal Medicine

## 2014-06-03 ENCOUNTER — Emergency Department (HOSPITAL_COMMUNITY): Payer: Medicare HMO

## 2014-06-03 DIAGNOSIS — I2510 Atherosclerotic heart disease of native coronary artery without angina pectoris: Secondary | ICD-10-CM | POA: Diagnosis present

## 2014-06-03 DIAGNOSIS — N183 Chronic kidney disease, stage 3 unspecified: Secondary | ICD-10-CM | POA: Diagnosis present

## 2014-06-03 DIAGNOSIS — Z79899 Other long term (current) drug therapy: Secondary | ICD-10-CM

## 2014-06-03 DIAGNOSIS — I639 Cerebral infarction, unspecified: Secondary | ICD-10-CM

## 2014-06-03 DIAGNOSIS — I129 Hypertensive chronic kidney disease with stage 1 through stage 4 chronic kidney disease, or unspecified chronic kidney disease: Secondary | ICD-10-CM | POA: Diagnosis present

## 2014-06-03 DIAGNOSIS — Z9861 Coronary angioplasty status: Secondary | ICD-10-CM

## 2014-06-03 DIAGNOSIS — N259 Disorder resulting from impaired renal tubular function, unspecified: Secondary | ICD-10-CM

## 2014-06-03 DIAGNOSIS — E119 Type 2 diabetes mellitus without complications: Secondary | ICD-10-CM | POA: Diagnosis present

## 2014-06-03 DIAGNOSIS — I252 Old myocardial infarction: Secondary | ICD-10-CM

## 2014-06-03 DIAGNOSIS — Z8249 Family history of ischemic heart disease and other diseases of the circulatory system: Secondary | ICD-10-CM

## 2014-06-03 DIAGNOSIS — I10 Essential (primary) hypertension: Secondary | ICD-10-CM

## 2014-06-03 DIAGNOSIS — C439 Malignant melanoma of skin, unspecified: Secondary | ICD-10-CM

## 2014-06-03 DIAGNOSIS — Z823 Family history of stroke: Secondary | ICD-10-CM

## 2014-06-03 DIAGNOSIS — Z8546 Personal history of malignant neoplasm of prostate: Secondary | ICD-10-CM

## 2014-06-03 DIAGNOSIS — Z7982 Long term (current) use of aspirin: Secondary | ICD-10-CM

## 2014-06-03 DIAGNOSIS — D510 Vitamin B12 deficiency anemia due to intrinsic factor deficiency: Secondary | ICD-10-CM

## 2014-06-03 DIAGNOSIS — I214 Non-ST elevation (NSTEMI) myocardial infarction: Secondary | ICD-10-CM | POA: Diagnosis not present

## 2014-06-03 DIAGNOSIS — G250 Essential tremor: Secondary | ICD-10-CM

## 2014-06-03 DIAGNOSIS — Z85820 Personal history of malignant melanoma of skin: Secondary | ICD-10-CM

## 2014-06-03 DIAGNOSIS — R079 Chest pain, unspecified: Secondary | ICD-10-CM | POA: Diagnosis not present

## 2014-06-03 DIAGNOSIS — N189 Chronic kidney disease, unspecified: Secondary | ICD-10-CM

## 2014-06-03 DIAGNOSIS — I209 Angina pectoris, unspecified: Secondary | ICD-10-CM

## 2014-06-03 DIAGNOSIS — E785 Hyperlipidemia, unspecified: Secondary | ICD-10-CM | POA: Diagnosis present

## 2014-06-03 DIAGNOSIS — I25119 Atherosclerotic heart disease of native coronary artery with unspecified angina pectoris: Secondary | ICD-10-CM

## 2014-06-03 DIAGNOSIS — Z86711 Personal history of pulmonary embolism: Secondary | ICD-10-CM

## 2014-06-03 DIAGNOSIS — G252 Other specified forms of tremor: Secondary | ICD-10-CM

## 2014-06-03 DIAGNOSIS — I6389 Other cerebral infarction: Secondary | ICD-10-CM

## 2014-06-03 DIAGNOSIS — E781 Pure hyperglyceridemia: Secondary | ICD-10-CM | POA: Diagnosis present

## 2014-06-03 HISTORY — DX: Chronic kidney disease, stage 3 unspecified: N18.30

## 2014-06-03 HISTORY — DX: Transient cerebral ischemic attack, unspecified: G45.9

## 2014-06-03 HISTORY — DX: Chronic kidney disease, stage 3 (moderate): N18.3

## 2014-06-03 HISTORY — DX: Other ill-defined heart diseases: I51.89

## 2014-06-03 LAB — CBC
HEMATOCRIT: 39.1 % (ref 39.0–52.0)
Hemoglobin: 13.5 g/dL (ref 13.0–17.0)
MCH: 28.2 pg (ref 26.0–34.0)
MCHC: 34.5 g/dL (ref 30.0–36.0)
MCV: 81.8 fL (ref 78.0–100.0)
Platelets: 168 10*3/uL (ref 150–400)
RBC: 4.78 MIL/uL (ref 4.22–5.81)
RDW: 13.8 % (ref 11.5–15.5)
WBC: 5.7 10*3/uL (ref 4.0–10.5)

## 2014-06-03 LAB — BASIC METABOLIC PANEL
Anion gap: 10 (ref 5–15)
BUN: 18 mg/dL (ref 6–23)
CHLORIDE: 101 meq/L (ref 96–112)
CO2: 27 meq/L (ref 19–32)
CREATININE: 1.51 mg/dL — AB (ref 0.50–1.35)
Calcium: 10.1 mg/dL (ref 8.4–10.5)
GFR calc Af Amer: 52 mL/min — ABNORMAL LOW (ref >90)
GFR calc non Af Amer: 45 mL/min — ABNORMAL LOW (ref >90)
GLUCOSE: 93 mg/dL (ref 70–99)
Potassium: 4.7 mEq/L (ref 3.7–5.3)
Sodium: 138 mEq/L (ref 137–147)

## 2014-06-03 LAB — I-STAT TROPONIN, ED: Troponin i, poc: 0 ng/mL (ref 0.00–0.08)

## 2014-06-03 MED ORDER — ASPIRIN EC 81 MG PO TBEC
81.0000 mg | DELAYED_RELEASE_TABLET | Freq: Every day | ORAL | Status: DC
  Administered 2014-06-04 – 2014-06-08 (×4): 81 mg via ORAL
  Filled 2014-06-03 (×5): qty 1

## 2014-06-03 MED ORDER — MORPHINE SULFATE 2 MG/ML IJ SOLN
2.0000 mg | INTRAMUSCULAR | Status: DC | PRN
  Filled 2014-06-03: qty 1

## 2014-06-03 MED ORDER — ENOXAPARIN SODIUM 40 MG/0.4ML ~~LOC~~ SOLN
40.0000 mg | Freq: Every day | SUBCUTANEOUS | Status: DC
  Administered 2014-06-03: 40 mg via SUBCUTANEOUS
  Filled 2014-06-03: qty 0.4

## 2014-06-03 MED ORDER — METOPROLOL TARTRATE 50 MG PO TABS
50.0000 mg | ORAL_TABLET | Freq: Two times a day (BID) | ORAL | Status: DC
  Administered 2014-06-03 – 2014-06-08 (×9): 50 mg via ORAL
  Filled 2014-06-03 (×12): qty 1

## 2014-06-03 MED ORDER — ACETAMINOPHEN 325 MG PO TABS
650.0000 mg | ORAL_TABLET | ORAL | Status: DC | PRN
  Administered 2014-06-05 – 2014-06-06 (×6): 650 mg via ORAL
  Filled 2014-06-03 (×6): qty 2

## 2014-06-03 MED ORDER — ONDANSETRON HCL 4 MG/2ML IJ SOLN
4.0000 mg | Freq: Four times a day (QID) | INTRAMUSCULAR | Status: DC | PRN
  Administered 2014-06-05: 4 mg via INTRAVENOUS
  Filled 2014-06-03: qty 2

## 2014-06-03 MED ORDER — ASPIRIN EC 81 MG PO TBEC
81.0000 mg | DELAYED_RELEASE_TABLET | Freq: Every day | ORAL | Status: DC
  Filled 2014-06-03: qty 1

## 2014-06-03 MED ORDER — ASPIRIN 81 MG PO CHEW
324.0000 mg | CHEWABLE_TABLET | Freq: Once | ORAL | Status: AC
  Administered 2014-06-03: 324 mg via ORAL
  Filled 2014-06-03: qty 4

## 2014-06-03 MED ORDER — AMLODIPINE BESYLATE 2.5 MG PO TABS
2.5000 mg | ORAL_TABLET | Freq: Every day | ORAL | Status: DC
  Administered 2014-06-04 – 2014-06-08 (×5): 2.5 mg via ORAL
  Filled 2014-06-03 (×5): qty 1

## 2014-06-03 MED ORDER — ATORVASTATIN CALCIUM 20 MG PO TABS
20.0000 mg | ORAL_TABLET | Freq: Every day | ORAL | Status: DC
  Administered 2014-06-03: 20 mg via ORAL
  Filled 2014-06-03 (×2): qty 1

## 2014-06-03 NOTE — ED Notes (Signed)
Patient transported to X-ray 

## 2014-06-03 NOTE — ED Notes (Signed)
Pt reports intermittent cp that started at 11am radiating down R arm lasting 15 secs. sts at first the pain was constant for 1 hour however.  reports similar pain when he had his heart attack. Denies, sob, nv, diaphoresis.

## 2014-06-03 NOTE — H&P (Signed)
PCP: Unice Cobble, MD  [facility]   Chief Complaint:  Chest pain  HPI: [name] is a 68 y.o. male   has a past medical history of Hypertension; Hyperlipidemia; Anemia, mild; Vitamin B12 deficiency; Prostate cancer; Skin cancer; CAD (coronary artery disease); Renal insufficiency; echocardiogram; Melanoma; and Pulmonary embolism.   Presented with  Patient has hx of CAD sp stenting in 2011. Today while talking on a phone he started to have chest pain on the right side felt like pressure no shortness of breath or nausea this lasted for about 2 hours. He took some Tums and it eased off but continued to come back a bit. Pain spread to right arm.  He took some aspirin. The pain continued to come and go and his wife took him to the hospital. Patient refused nitroglycerine because he had synopsized from that in the past. Currently chest pain free. His has not had any cardiac studies since 2011.   Hospitalist was called for admission for  Chest pain  Review of Systems:    Pertinent positives include:  chest pain,   Constitutional:  No weight loss, night sweats, Fevers, chills, fatigue, weight loss  HEENT:  No headaches, Difficulty swallowing,Tooth/dental problems,Sore throat,  No sneezing, itching, ear ache, nasal congestion, post nasal drip,  Cardio-vascular:  No Orthopnea, PND, anasarca, dizziness, palpitations.no Bilateral lower extremity swelling  GI:  No heartburn, indigestion, abdominal pain, nausea, vomiting, diarrhea, change in bowel habits, loss of appetite, melena, blood in stool, hematemesis Resp:  no shortness of breath at rest. No dyspnea on exertion, No excess mucus, no productive cough, No non-productive cough, No coughing up of blood.No change in color of mucus.No wheezing. Skin:  no rash or lesions. No jaundice GU:  no dysuria, change in color of urine, no urgency or frequency. No straining to urinate.  No flank pain.  Musculoskeletal:  No joint pain or no  joint swelling. No decreased range of motion. No back pain.  Psych:  No change in mood or affect. No depression or anxiety. No memory loss.  Neuro: no localizing neurological complaints, no tingling, no weakness, no double vision, no gait abnormality, no slurred speech, no confusion  Otherwise ROS are negative except for above, 10 systems were reviewed  Past Medical History: Past Medical History  Diagnosis Date  . Hypertension     did not tolerate Lisinopril event at low dose  . Hyperlipidemia   . Anemia, mild   . Vitamin B12 deficiency   . Prostate cancer   . Skin cancer     [id number]  . CAD (coronary artery disease)     NSTEMI 5/11.  LHC showed 60% prox LAD, 70% mid LAD, 95% mid RCA with TIMI 2 flow distally, EF 60%.  Patient had PROMUS DES to [facility].  Marland Kitchen Renal insufficiency   . Hx of echocardiogram     Echo 6/11:  mild LVH, EF 55-60%, Gr 1 diast dysfn, Trivial MR, mild LAE  . Melanoma     X 2   Past Surgical History  Procedure Laterality Date  . Cholecystectomy    . Prostatectomy      Dr. Karsten Ro  . Knee arthroscopy      right  . Tonsillectomy    . Mrca stent  02/2010  . Melanoma excision  2011     L chest  . Melanoma excision  1991    back     Medications: Prior to Admission medications   Medication Sig Start Date  End Date Taking? Authorizing Provider  amLODipine (NORVASC) 2.5 MG tablet TAKE 1 TABLET BY MOUTH DAILY   Yes Larey Dresser, MD  aspirin EC 81 MG tablet Take two tablets daily 03/18/11  Yes Larey Dresser, MD  atorvastatin (LIPITOR) 20 MG tablet Take 20 mg by mouth daily.   Yes Historical Provider, MD  calcium carbonate (TUMS - DOSED IN MG ELEMENTAL CALCIUM) 500 MG chewable tablet Chew 2 tablets by mouth daily.   Yes Historical Provider, MD  fish oil-omega-3 fatty acids 1000 MG capsule Take 2 g by mouth 2 (two) times daily.     Yes Historical Provider, MD  metoprolol (LOPRESSOR) 50 MG tablet Take 50 mg by mouth 2 (two) times daily.   Yes Historical  Provider, MD  Potassium Chloride (K+ POTASSIUM PO) Take 1 tablet by mouth daily.    Yes Historical Provider, MD    Allergies:   Allergies  Allergen Reactions  . Lidocaine     Syncope post palmer injection; probably vasovagal as no reaction to intraarticular Lidocaine into knee    Social History:  Ambulatory   independently    Lives at home  With family     reports that he has never smoked. He does not have any smokeless tobacco history on file. He reports that he does not drink alcohol or use illicit drugs.    Family History: family history includes Coronary artery disease in his mother; Hypertension in his brother; Prostate cancer in his father; Stroke (age of onset: 43) in his mother. There is no history of Diabetes.    Physical Exam: Patient Vitals for the past 24 hrs:  BP Temp Temp src Pulse Resp SpO2 Height Weight  06/03/14 2000 142/72 mmHg - - 58 17 94 % - -  06/03/14 1945 131/76 mmHg - - 58 19 95 % - -  06/03/14 1930 141/78 mmHg - - 60 12 97 % - -  06/03/14 1922 140/94 mmHg - - 62 16 97 % - -  06/03/14 1915 140/94 mmHg - - 60 13 99 % - -  06/03/14 1845 142/72 mmHg - - 59 14 98 % - -  06/03/14 1830 136/74 mmHg - - 61 10 99 % - -  06/03/14 1815 146/77 mmHg - - 60 19 98 % - -  06/03/14 1800 - - - 61 11 99 % - -  06/03/14 1743 - - - - - 99 % - -  06/03/14 1618 144/85 mmHg 98.8 F (37.1 C) Oral 65 - 97 % 6\' 4"  (1.93 m) 97.523 kg (215 lb)    1. General:  in No Acute distress 2. Psychological: Alert and  Oriented 3. Head/ENT:   Moist  Mucous Membranes                          Head Non traumatic, neck supple                          Normal Dentition 4. SKIN: normal  Skin turgor,  Skin clean Dry and intact evidence of sun damage on arms and neck 5. Heart: Regular rate and rhythm no Murmur, Rub or gallop 6. Lungs: Clear to auscultation bilaterally, no wheezes or crackles   7. Abdomen: Soft, non-tender, Non distended 8. Lower extremities: no clubbing, cyanosis, or  edema 9. Neurologically Grossly intact, moving all 4 extremities equally 10. MSK: Normal range of motion  body mass index  is 26.18 kg/(m^2).   Labs on Admission:   Recent Labs  06/03/14 1630  NA 138  K 4.7  CL 101  CO2 27  GLUCOSE 93  BUN 18  CREATININE 1.51*  CALCIUM 10.1   No results found for this basename: AST, ALT, ALKPHOS, BILITOT, PROT, ALBUMIN,  in the last 72 hours No results found for this basename: LIPASE, AMYLASE,  in the last 72 hours  Recent Labs  06/03/14 1630  WBC 5.7  HGB 13.5  HCT 39.1  MCV 81.8  PLT 168   No results found for this basename: CKTOTAL, CKMB, CKMBINDEX, TROPONINI,  in the last 72 hours No results found for this basename: TSH, T4TOTAL, FREET3, T3FREE, THYROIDAB,  in the last 72 hours No results found for this basename: VITAMINB12, FOLATE, FERRITIN, TIBC, IRON, RETICCTPCT,  in the last 72 hours Lab Results  Component Value Date   HGBA1C 5.6 08/03/2013    Estimated Creatinine Clearance: 55.9 ml/min (by C-G formula based on Cr of 1.51). ABG No results found for this basename: phart, pco2, po2, hco3, tco2, acidbasedef, o2sat     No results found for this basename: DDIMER     Other results:  I have pearsonaly reviewed this: ECG REPORT  Rate:65  Rhythm: NSR  ST&T Change: no ischemia   BNP (last 3 results) No results found for this basename: PROBNP,  in the last 8760 hours  Filed Weights   06/03/14 1618  Weight: 97.523 kg (215 lb)   Cultures: No results found for this basename: sdes, specrequest, cult, reptstatus    Radiological Exams on Admission: Dg Chest 2 View  06/03/2014   CLINICAL DATA:  Mid right chest pain.  EXAM: CHEST  2 VIEW  COMPARISON:  02/11/2010  FINDINGS: The heart size and mediastinal contours are within normal limits. Both lungs are clear. The visualized skeletal structures are unremarkable.  IMPRESSION: No active cardiopulmonary disease.   Electronically Signed   By: Rolm Baptise M.D.   On: 06/03/2014  19:17    Chart has been reviewed  Assessment/Plan  68 yo M with hx of HTN and CAD sp stenting in 2011 here with atypical chest pain at rest negative cardiac work up so far HEART score of 5  Present on Admission:  . Chest pain -   given risk factors will admit, monitor on telemetry, cycle cardiac enzymes, obtain serial ECG. Further risk stratify with lipid panel, hgA1C, obtain TSH. Make sure patient is on Aspirin. Further treatment based on the currently pending results.  Marland Kitchen RENAL INSUFFICIENCY - Chronic unchanged, will stop potasium, advised to avoid NSAIDS . HYPERTENSION - continue home meds . CORONARY ATHEROSCLEROSIS NATIVE CORONARY ARTERY - continue aspirin, metoprolol and lipitor, would benefit from stress testing given new symptoms. Keep NPO post midnight   Prophylaxis:  Lovenox, Protonix  CODE STATUS:  FULL CODE as per patient's wishes  Other plan as per orders.  I have spent a total of 55 min on this admission  Poppi Scantling 06/03/2014, 8:15 PM  [facility]  Pager [phone]   If 7AM-7PM, please contact the day team taking care of the patient  [email or web address]  Password [email or web address]

## 2014-06-03 NOTE — ED Provider Notes (Signed)
CSN: [id number]     Arrival date & time 06/03/14  1605 History   First MD Initiated Contact with Patient 06/03/14 1820     Chief Complaint  Patient presents with  . Chest Pain     Patient is a 68 y.o. male presenting with chest pain. The history is provided by the patient.  Chest Pain Pain location:  R chest Pain radiates to:  R arm Pain severity:  Moderate Duration:  1 hour Timing:  Intermittent Progression:  Improving Chronicity:  New Relieved by:  Aspirin Worsened by:  Nothing tried Associated symptoms: no abdominal pain, no dizziness, no fever, no shortness of breath, not vomiting and no weakness   Pt reports he had onset of chest pain earlier today The initial episode lasted and hour then resolved It radiated into right arm He denies SOB/weakness/dizziness He reports since initial episode he has had multiple episodes but they lasted less than one minute   Past Medical History  Diagnosis Date  . Hypertension     did not tolerate Lisinopril event at low dose  . Hyperlipidemia   . Anemia, mild   . Vitamin B12 deficiency   . Prostate cancer   . Skin cancer     [id number]  . CAD (coronary artery disease)     NSTEMI 5/11.  LHC showed 60% prox LAD, 70% mid LAD, 95% mid RCA with TIMI 2 flow distally, EF 60%.  Patient had PROM[facility] DES to [facility].  Marland Kitchen Renal insufficiency   . Hx of echocardiogram     Echo 6/11:  mild LVH, EF 55-60%, Gr 1 diast dysfn, Trivial MR, mild LAE  . Melanoma     X 2   Past Surgical History  Procedure Laterality Date  . Cholecystectomy    . Prostatectomy      Dr. Karsten Ro  . Knee arthroscopy      right  . Tonsillectomy    . Mrca stent  02/2010  . Melanoma excision  2011     L chest  . Melanoma excision  1991    back   Family History  Problem Relation Age of Onset  . Coronary artery disease Mother     S/P CABG, in her 2s  . Stroke Mother 54  . Prostate cancer Father   . Hypertension Brother   . Diabetes Neg Hx    History  Substance Use  Topics  . Smoking status: Never Smoker   . Smokeless tobacco: Not on file  . Alcohol Use: No    Review of Systems  Constitutional: Negative for fever.  Respiratory: Negative for shortness of breath.   Cardiovascular: Positive for chest pain. Negative for leg swelling.  Gastrointestinal: Negative for vomiting and abdominal pain.  Neurological: Negative for dizziness and weakness.  All other systems reviewed and are negative.     Allergies  Lidocaine  Home Medications   Prior to Admission medications   Medication Sig Start Date End Date Taking? Authorizing Provider  amLODipine (NORVASC) 2.5 MG tablet TAKE 1 TABLET BY MOUTH DAILY   Yes Larey Dresser, MD  aspirin EC 81 MG tablet Take two tablets daily 03/18/11  Yes Larey Dresser, MD  atorvastatin (LIPITOR) 20 MG tablet Take 20 mg by mouth daily.   Yes Historical Provider, MD  calcium carbonate (TUMS - DOSED IN MG ELEMENTAL CALCIUM) 500 MG chewable tablet Chew 2 tablets by mouth daily.   Yes Historical Provider, MD  fish oil-omega-3 fatty acids 1000 MG capsule Take 2  g by mouth 2 (two) times daily.     Yes Historical Provider, MD  metoprolol (LOPRESSOR) 50 MG tablet Take 50 mg by mouth 2 (two) times daily.   Yes Historical Provider, MD  Potassium Chloride (K+ POTASSIUM PO) Take 1 tablet by mouth daily.    Yes Historical Provider, MD   BP 140/94  Pulse 62  Temp(Src) 98.8 F (37.1 C) (Oral)  Resp 16  Ht 6\' 4"  (1.93 m)  Wt 215 lb (97.523 kg)  BMI 26.18 kg/m2  SpO2 97% Physical Exam CONSTITUTIONAL: Well developed/well nourished HEAD: Normocephalic/atraumatic EYES: EOMI/PERRL ENMT: Mucous membranes moist NECK: supple no meningeal signs SPINE:entire spine nontender CV: S1/S2 noted, no murmurs/rubs/gallops noted LUNGS: Lungs are clear to auscultation bilaterally, no apparent distress ABDOMEN: soft, nontender, no rebound or guarding GU:no cva tenderness NEURO: Pt is awake/alert, moves all extremitiesx4 EXTREMITIES: pulses  normal, full ROM, no LE edema/tenderness noted SKIN: warm, color normal PSYCH: no abnormalities of mood noted  ED Course  Procedures  8:03 PM Pt now CP free He has h/o CAD and no recent CP symptoms D/w cardiology (jacob kelly) Will admit to medicine for observation 8:11 PM D/w dr Roel Cluck, will admit to [facility] - Abnormal; Notable for the following:    Creatinine, Ser 1.51 (*)    GFR calc non Af Amer 45 (*)    GFR calc Af Amer 52 (*)    All other components within normal limits  CBC  I-STAT TROPOININ, ED    Imaging Review Dg Chest 2 View  06/03/2014   CLINICAL DATA:  Mid right chest pain.  EXAM: CHEST  2 VIEW  COMPARISON:  02/11/2010  FINDINGS: The heart size and mediastinal contours are within normal limits. Both lungs are clear. The visualized skeletal structures are unremarkable.  IMPRESSION: No active cardiopulmonary disease.   Electronically Signed   By: Rolm Baptise M.D.   On: 06/03/2014 19:17     EKG Interpretation   Date/Time:  Saturday June 03 2014 16:11:16 EDT Ventricular Rate:  65 PR Interval:  186 QRS Duration: 74 QT Interval:  366 QTC Calculation: 380 R Axis:   52 Text Interpretation:  Normal sinus rhythm Normal ECG No significant change  since last tracing Confirmed by Christy Gentles  MD, Elenore Rota ([id number]) on 06/03/2014  6:22:02 PM      MDM   Final diagnoses:  Chest pain, rule out acute myocardial infarction    Nursing notes including past medical history and social history reviewed and considered in documentation xrays reviewed and considered Labs/vital reviewed and considered Previous records reviewed and considered     Sharyon Cable, MD 06/03/14 2011

## 2014-06-03 NOTE — Consult Note (Addendum)
Reason for Consult: chest pain Primary cardiologist: Dr. Aundra Dubin Referring Physician: Dr. Arna [name] is an 68 y.o. male.  HPI: [name] is a 68 yo man with PMH of hypertension, dyslipidemia, CAD with NSTEMI 5/11 with DES to MRCA (and pLAD disease) who last saw Dr. Aundra Dubin 2012 who presents with right-sided chest pain with pressure sensation that lasted 1-2 hours after eating breakfast this AM (11-12/1) with some improvement after taking 4 tums.  He also had some associated right arm tingling/pain radiation that actually prompted him to come in. He is accompanied by his wife and she tells me he's actually be having pain or shortness of breath or fatigue for several weeks. On deeper questioning, he tells me he does get a burning sensation in his chest [place] that resolves with rest.    Past Medical History  Diagnosis Date  . Hypertension     did not tolerate Lisinopril event at low dose  . Hyperlipidemia   . Anemia, mild   . Vitamin B12 deficiency   . Prostate cancer   . Skin cancer     [id number]  . CAD (coronary artery disease)     NSTEMI 5/11.  LHC showed 60% prox LAD, 70% mid LAD, 95% mid RCA with TIMI 2 flow distally, EF 60%.  Patient had PROMUS DES to [facility].  Marland Kitchen Renal insufficiency   . Hx of echocardiogram     Echo 6/11:  mild LVH, EF 55-60%, Gr 1 diast dysfn, Trivial MR, mild LAE  . Melanoma     X 2  . Pulmonary embolism     Past Surgical History  Procedure Laterality Date  . Cholecystectomy    . Prostatectomy      Dr. Karsten Ro  . Knee arthroscopy      right  . Tonsillectomy    . Mrca stent  02/2010  . Melanoma excision  2011     L chest  . Melanoma excision  1991    back    Family History  Problem Relation Age of Onset  . Coronary artery disease Mother     S/P CABG, in her 55s  . Stroke Mother 18  . Prostate cancer Father   . Hypertension Brother   . Diabetes Neg Hx     Social History:  reports that he has never  smoked. He does not have any smokeless tobacco history on file. He reports that he does not drink alcohol or use illicit drugs.  Allergies:  Allergies  Allergen Reactions  . Lidocaine     Syncope post palmer injection; probably vasovagal as no reaction to intraarticular Lidocaine into knee    Medications:  I have reviewed the patient's current medications. Prior to Admission:  Prescriptions prior to admission  Medication Sig Dispense Refill  . amLODipine (NORVASC) 2.5 MG tablet TAKE 1 TABLET BY MOUTH DAILY  90 tablet  2  . aspirin EC 81 MG tablet Take two tablets daily      . atorvastatin (LIPITOR) 20 MG tablet Take 20 mg by mouth daily.      . calcium carbonate (TUMS - DOSED IN MG ELEMENTAL CALCIUM) 500 MG chewable tablet Chew 2 tablets by mouth daily.      . fish oil-omega-3 fatty acids 1000 MG capsule Take 2 g by mouth 2 (two) times daily.        . metoprolol (LOPRESSOR) 50 MG tablet Take 50 mg by mouth 2 (two)  times daily.      . Potassium Chloride (K+ POTASSIUM PO) Take 1 tablet by mouth daily.        Scheduled: . [START ON 06/04/2014] amLODipine  2.5 mg Oral Daily  . [START ON 06/04/2014] aspirin EC  81 mg Oral Daily  . atorvastatin  20 mg Oral q1800  . enoxaparin (LOVENOX) injection  40 mg Subcutaneous QHS  . metoprolol  50 mg Oral BID   Continuous:   Results for orders placed during the hospital encounter of 06/03/14 (from the past 48 hour(s))  CBC     Status: None   Collection Time    06/03/14  4:30 PM      Result Value Ref Range   WBC 5.7  4.0 - 10.5 K/uL   RBC 4.78  4.22 - 5.81 MIL/uL   Hemoglobin 13.5  13.0 - 17.0 g/dL   HCT 39.1  39.0 - 52.0 %   MCV 81.8  78.0 - 100.0 fL   MCH 28.2  26.0 - 34.0 pg   MCHC 34.5  30.0 - 36.0 g/dL   RDW 13.8  11.5 - 15.5 %   Platelets 168  150 - 400 K/uL  BASIC METABOLIC PANEL     Status: Abnormal   Collection Time    06/03/14  4:30 PM      Result Value Ref Range   Sodium 138  137 - 147 mEq/L   Potassium 4.7  3.7 - 5.3 mEq/L     Chloride 101  96 - 112 mEq/L   CO2 27  19 - 32 mEq/L   Glucose, Bld 93  70 - 99 mg/dL   BUN 18  6 - 23 mg/dL   Creatinine, Ser 1.51 (*) 0.50 - 1.35 mg/dL   Calcium 10.1  8.4 - 10.5 mg/dL   GFR calc non Af Amer 45 (*) >90 mL/min   GFR calc Af Amer 52 (*) >90 mL/min   Comment: (NOTE)     The eGFR has been calculated using the CKD EPI equation.     This calculation has not been validated in all clinical situations.     eGFR's persistently <90 mL/min signify possible Chronic Kidney     Disease.   Anion gap 10  5 - 15  I-STAT TROPOININ, ED     Status: None   Collection Time    06/03/14  4:39 PM      Result Value Ref Range   Troponin i, poc 0.00  0.00 - 0.08 ng/mL   Comment 3            Comment: Due to the release kinetics of cTnI,     a negative result within the first hours     of the onset of symptoms does not rule out     myocardial infarction with certainty.     If myocardial infarction is still suspected,     repeat the test at appropriate intervals.    Dg Chest 2 View  06/03/2014   CLINICAL DATA:  Mid right chest pain.  EXAM: CHEST  2 VIEW  COMPARISON:  02/11/2010  FINDINGS: The heart size and mediastinal contours are within normal limits. Both lungs are clear. The visualized skeletal structures are unremarkable.  IMPRESSION: No active cardiopulmonary disease.   Electronically Signed   By: Rolm Baptise M.D.   On: 06/03/2014 19:17    Review of Systems  Constitutional: Positive for malaise/fatigue. Negative for fever and chills.  HENT: Negative for ear discharge and  hearing loss.   Eyes: Negative for double vision and photophobia.  Respiratory: Negative for cough and hemoptysis.   Cardiovascular: Positive for chest pain. Negative for leg swelling.  Gastrointestinal: Positive for heartburn. Negative for nausea and vomiting.  Genitourinary: Negative for dysuria and hematuria.  Musculoskeletal: Negative for myalgias and neck pain.  Skin: Negative for rash.  Neurological:  Positive for tingling. Negative for speech change and focal weakness.  Endo/Heme/Allergies: Negative for polydipsia. Does not bruise/bleed easily.  Psychiatric/Behavioral: Negative for depression, suicidal ideas, hallucinations and substance abuse.   Blood pressure 139/80, pulse 56, temperature 98.8 F (37.1 C), temperature source Oral, resp. rate 18, height $RemoveBe'6\' 4"'mMVksfUHN$  (1.93 m), weight 95.709 kg (211 lb), SpO2 100.00%. Physical Exam  Nursing note reviewed. Constitutional: He is oriented to person, place, and time. He appears well-developed and well-nourished. No distress.  HENT:  Head: Normocephalic and atraumatic.  Nose: Nose normal.  Mouth/Throat: Oropharynx is clear and moist. No oropharyngeal exudate.  Eyes: Conjunctivae and EOM are normal. Pupils are equal, round, and reactive to light. No scleral icterus.  Neck: Normal range of motion. Neck supple. No JVD present. No tracheal deviation present.  Cardiovascular: Normal rate, regular rhythm, normal heart sounds and intact distal pulses.  Exam reveals no gallop.   No murmur heard. Respiratory: Effort normal and breath sounds normal. No respiratory distress. He has no rales.  GI: Soft. Bowel sounds are normal. He exhibits no distension. There is no tenderness. There is no rebound.  Musculoskeletal: Normal range of motion. He exhibits no edema and no tenderness.  Neurological: He is alert and oriented to person, place, and time. No cranial nerve deficit. Coordination normal.  Skin: Skin is warm and dry. No rash noted. He is not diaphoretic. No erythema.  Psychiatric: He has a normal mood and affect. His behavior is normal. Thought content normal.   Labs reviewed; wbc 5.7, h/h 18/1.5, trop negative, chest x-ray unrevealing ECG Sinus rhythm, HR 60s  Assessment/Plan: [name] is a 68 yo man with PMH of hypertension, dyslipidemia, chronic kidney disease stage III and CAD with previous DES to [facility] 5/11 who presents with atypical chest pain.  Differential diagnosis is musculoskeletal, angina, ACS among other etiologies. Given significant risk factors, agree with observing overnight and if he remains chest pain free with negative cardiac biomarkers, then pursue stress testing in AM. He has typical and atypical features of his chest pain.  1. Chest Pain: observation status, trend cardiac biomarkers, stress test in AM if remains chest pain free/negative cardiac biomarkers, continue metoprolol 50 mg bid, asa 81 mg daily 2. Hypertension: continue amlodipine 2.5 mg 3. Dyslipidemia: continue atorvastatin 20 mg 4. Chronic Kidney Disease: continue to treat hypertension/dyslipidemia 5. Known CAD: continue aspirin/metoprolol/atorvastatin  Saturnino Liew 06/03/2014, 10:45 PM   Addendum: given elevated troponin, added story of exertional chest burning and known CAD, pharmacy consult for heparin and clinical story moving towards NSTEMI.

## 2014-06-03 NOTE — Progress Notes (Signed)
Pt's troponin came back positive at 0.44. Pt denies any CP. Md on call made aware and new order received for heparin. Will cont to monitor pt.

## 2014-06-04 DIAGNOSIS — E785 Hyperlipidemia, unspecified: Secondary | ICD-10-CM

## 2014-06-04 DIAGNOSIS — I059 Rheumatic mitral valve disease, unspecified: Secondary | ICD-10-CM

## 2014-06-04 DIAGNOSIS — I2510 Atherosclerotic heart disease of native coronary artery without angina pectoris: Secondary | ICD-10-CM

## 2014-06-04 DIAGNOSIS — I10 Essential (primary) hypertension: Secondary | ICD-10-CM

## 2014-06-04 DIAGNOSIS — I209 Angina pectoris, unspecified: Secondary | ICD-10-CM

## 2014-06-04 DIAGNOSIS — N183 Chronic kidney disease, stage 3 unspecified: Secondary | ICD-10-CM

## 2014-06-04 DIAGNOSIS — G252 Other specified forms of tremor: Secondary | ICD-10-CM

## 2014-06-04 DIAGNOSIS — G250 Essential tremor: Secondary | ICD-10-CM

## 2014-06-04 DIAGNOSIS — I214 Non-ST elevation (NSTEMI) myocardial infarction: Secondary | ICD-10-CM

## 2014-06-04 LAB — HEMOGLOBIN A1C
Hgb A1c MFr Bld: 5.8 % — ABNORMAL HIGH (ref <5.7)
Mean Plasma Glucose: 120 mg/dL — ABNORMAL HIGH (ref <117)

## 2014-06-04 LAB — CBC
HEMATOCRIT: 38.8 % — AB (ref 39.0–52.0)
Hemoglobin: 13.4 g/dL (ref 13.0–17.0)
MCH: 28.8 pg (ref 26.0–34.0)
MCHC: 34.5 g/dL (ref 30.0–36.0)
MCV: 83.4 fL (ref 78.0–100.0)
Platelets: 122 10*3/uL — ABNORMAL LOW (ref 150–400)
RBC: 4.65 MIL/uL (ref 4.22–5.81)
RDW: 13.9 % (ref 11.5–15.5)
WBC: 5 10*3/uL (ref 4.0–10.5)

## 2014-06-04 LAB — LIPID PANEL
Cholesterol: 122 mg/dL (ref 0–200)
HDL: 32 mg/dL — ABNORMAL LOW (ref >39)
LDL CALC: UNDETERMINED mg/dL (ref 0–99)
TRIGLYCERIDES: 440 mg/dL — AB (ref <150)
Total CHOL/HDL Ratio: 3.8 RATIO
VLDL: UNDETERMINED mg/dL (ref 0–40)

## 2014-06-04 LAB — TROPONIN I
TROPONIN I: 0.44 ng/mL — AB (ref <0.30)
Troponin I: 0.89 ng/mL (ref <0.30)
Troponin I: 0.65 ng/mL (ref <0.30)

## 2014-06-04 LAB — HEPARIN LEVEL (UNFRACTIONATED)
Heparin Unfractionated: 0.57 IU/mL (ref 0.30–0.70)
Heparin Unfractionated: 0.37 IU/mL (ref 0.30–0.70)

## 2014-06-04 MED ORDER — ASPIRIN 81 MG PO CHEW
81.0000 mg | CHEWABLE_TABLET | ORAL | Status: AC
  Administered 2014-06-05: 81 mg via ORAL
  Filled 2014-06-04: qty 1

## 2014-06-04 MED ORDER — SODIUM CHLORIDE 0.9 % IJ SOLN: 3.0000 mL | Freq: Two times a day (BID) | INTRAMUSCULAR | Status: DC

## 2014-06-04 MED ORDER — HEPARIN (PORCINE) IN NACL 100-0.45 UNIT/ML-% IJ SOLN
1350.0000 [IU]/h | INTRAMUSCULAR | Status: DC
  Administered 2014-06-04: 1350 [IU]/h via INTRAVENOUS
  Filled 2014-06-04 (×2): qty 250

## 2014-06-04 MED ORDER — SODIUM CHLORIDE 0.9 % IV SOLN
INTRAVENOUS | Status: DC
  Administered 2014-06-05: 04:00:00 via INTRAVENOUS

## 2014-06-04 MED ORDER — HEPARIN (PORCINE) IN NACL 100-0.45 UNIT/ML-% IJ SOLN
1300.0000 [IU]/h | INTRAMUSCULAR | Status: DC
  Administered 2014-06-04: 1300 [IU]/h via INTRAVENOUS
  Filled 2014-06-04 (×3): qty 250

## 2014-06-04 MED ORDER — SODIUM CHLORIDE 0.9 % IV SOLN
250.0000 mL | INTRAVENOUS | Status: DC | PRN
Start: 2014-06-04 — End: 2014-06-05

## 2014-06-04 MED ORDER — SODIUM CHLORIDE 0.9 % IJ SOLN: 3.0000 mL | INTRAMUSCULAR | Status: DC | PRN

## 2014-06-04 MED ORDER — ATORVASTATIN CALCIUM 80 MG PO TABS
80.0000 mg | ORAL_TABLET | Freq: Every day | ORAL | Status: DC
  Administered 2014-06-04 – 2014-06-08 (×4): 80 mg via ORAL
  Filled 2014-06-04 (×6): qty 1

## 2014-06-04 NOTE — Progress Notes (Signed)
ANTICOAGULATION CONSULT NOTE - Initial Consult  Pharmacy Consult for heparin Indication: chest pain/ACS  Allergies  Allergen Reactions  . Lidocaine     Syncope post palmer injection; probably vasovagal as no reaction to intraarticular Lidocaine into knee    Patient Measurements: Height: 6\' 4"  (193 cm) Weight: 211 lb (95.709 kg) IBW/kg (Calculated) : 86.8  Vital Signs: Temp: 98.8 F (37.1 C) (08/29 2112) Temp src: Oral (08/29 2112) BP: 139/80 mmHg (08/29 2112) Pulse Rate: 56 (08/29 2112)  Labs:  Recent Labs  06/03/14 1630 06/03/14 2212  HGB 13.5  --   HCT 39.1  --   PLT 168  --   CREATININE 1.51*  --   TROPONINI  --  0.44*    Estimated Creatinine Clearance: 55.9 ml/min (by C-G formula based on Cr of 1.51).   Medical History: Past Medical History  Diagnosis Date  . Hypertension     did not tolerate Lisinopril event at low dose  . Hyperlipidemia   . Anemia, mild   . Vitamin B12 deficiency   . Prostate cancer   . Skin cancer     [id number]  . CAD (coronary artery disease)     NSTEMI 5/11.  LHC showed 60% prox LAD, 70% mid LAD, 95% mid RCA with TIMI 2 flow distally, EF 60%.  Patient had PROMUS DES to [facility].  Marland Kitchen Renal insufficiency   . Hx of echocardiogram     Echo 6/11:  mild LVH, EF 55-60%, Gr 1 diast dysfn, Trivial MR, mild LAE  . Melanoma     X 2  . Pulmonary embolism     Medications:  Prescriptions prior to admission  Medication Sig Dispense Refill  . amLODipine (NORVASC) 2.5 MG tablet TAKE 1 TABLET BY MOUTH DAILY  90 tablet  2  . aspirin EC 81 MG tablet Take two tablets daily      . atorvastatin (LIPITOR) 20 MG tablet Take 20 mg by mouth daily.      . calcium carbonate (TUMS - DOSED IN MG ELEMENTAL CALCIUM) 500 MG chewable tablet Chew 2 tablets by mouth daily.      . fish oil-omega-3 fatty acids 1000 MG capsule Take 2 g by mouth 2 (two) times daily.        . metoprolol (LOPRESSOR) 50 MG tablet Take 50 mg by mouth 2 (two) times daily.      . Potassium  Chloride (K+ POTASSIUM PO) Take 1 tablet by mouth daily.        Scheduled:  . amLODipine  2.5 mg Oral Daily  . aspirin EC  81 mg Oral Daily  . aspirin EC  81 mg Oral Daily  . atorvastatin  20 mg Oral q1800  . metoprolol  50 mg Oral BID    Assessment: 68yo male c/o intermittent CP radiating to RUE since 11am similar to prior MI, initial troponin negative though now increasing though currently w/o CP, to begin heparin.  Goal of Therapy:  Heparin level 0.3-0.7 units/ml Monitor platelets by anticoagulation protocol: Yes   Plan:  Rec'd Lovenox 40mg  ~77min ago; will begin heparin gtt at 1300 units/hr and monitor heparin levels and CBC.  Wynona Neat, PharmD, BCPS  06/04/2014,12:04 AM

## 2014-06-04 NOTE — Progress Notes (Addendum)
Patient Name: [name] Date of Encounter: 06/04/2014     Principal Problem:   NSTEMI (non-ST elevated myocardial infarction) Active Problems:   HYPERTRIGLYCERIDEMIA, HX OF   HYPERLIPIDEMIA   HYPERTENSION   CORONARY ATHEROSCLEROSIS NATIVE CORONARY ARTERY   Chronic kidney disease (CKD), stage III (moderate)   SUBJECTIVE  Denies CP or SOB. He had R sided chest pain radiate down R arm around 11am yesterday, took 2 ASA and 4 tums at home. By the time, he arrived in ED around 4pm, the chest discomfort already started to resolve. Has been having exertional CP for weeks, yesterday's episode was the first time recently when he had CP at rest. He also had CP with SOB during his NSTEMI in 2011, however no arm pain that time. Received a DES to LCx.   CURRENT MEDS . amLODipine  2.5 mg Oral Daily  . aspirin EC  81 mg Oral Daily  . atorvastatin  20 mg Oral q1800  . metoprolol  50 mg Oral BID   . heparin 1,300 Units/hr (06/04/14 0124)   OBJECTIVE  Filed Vitals:   06/03/14 2000 06/03/14 2015 06/03/14 2112 06/04/14 0500  BP: 142/72 141/78 139/80 125/62  Pulse: 58 52 56 65  Temp:   98.8 F (37.1 C) 98 F (36.7 C)  TempSrc:   Oral   Resp: 17 13 18 18   Height:   6\' 4"  (1.93 m)   Weight:   211 lb (95.709 kg)   SpO2: 94% 94% 100% 100%   No intake or output data in the 24 hours ending 06/04/14 0803 Filed Weights   06/03/14 1618 06/03/14 2112  Weight: 215 lb (97.523 kg) 211 lb (95.709 kg)    PHYSICAL EXAM  General: Pleasant, NAD. Neuro: Alert and oriented X 3. Moves all extremities spontaneously. Psych: Normal affect. HEENT:  Normal  Neck: Supple without bruits or JVD. Lungs:  Resp regular and unlabored, CTA. Heart: RRR no s3, s4, or murmurs. Abdomen: Soft, non-tender, non-distended, BS + x 4.  Extremities: No clubbing, cyanosis or edema. DP/PT/Radials 2+ and equal bilaterally.  Accessory Clinical Findings  CBC  Recent Labs  06/03/14 1630  WBC 5.7  HGB 13.5  HCT 39.1   MCV 81.8  PLT 025   Basic Metabolic Panel  Recent Labs  06/03/14 1630  NA 138  K 4.7  CL 101  CO2 27  GLUCOSE 93  BUN 18  CREATININE 1.51*  CALCIUM 10.1   Cardiac Enzymes  Recent Labs  06/03/14 2212 06/04/14 0512  TROPONINI 0.44* 0.89*   Fasting Lipid Panel  Recent Labs  06/04/14 0512  CHOL 122  HDL 32*  LDLCALC UNABLE TO CALCULATE IF TRIGLYCERIDE OVER 400 mg/dL  TRIG 440*  CHOLHDL 3.8    TELE NSR with HR 50s, no significant ventricular ectopy    ECG  NSR with minimal ST elevation in lateral leads  Echocardiogram  03/29/2010 Study Conclusions  - Left ventricle: The cavity size was normal. Wall thickness was increased in a pattern of mild LVH. Systolic function was normal. The estimated ejection fraction was in the range of 55% to 60%. Wall motion was normal; there were no regional wall motion abnormalities. Doppler parameters are consistent with abnormal left ventricular relaxation (grade 1 diastolic dysfunction). - Aortic valve: There was no stenosis. - Mitral valve: Mildly calcified annulus. Trivial regurgitation. - Left atrium: The atrium was mildly dilated. - Right ventricle: The cavity size was normal. Systolic function was normal. - Pulmonary arteries: No TR doppler  jet so unable to estimate PA systolic pressure. - Inferior vena cava: The vessel was normal in size; the respirophasic diameter changes were in the normal range (= 50%); findings are consistent with normal central venous pressure. Impressions:  - Normal LV size and systolic function, EF 56-43%. No regional wall motion abnormalities. Mild LV hypertrophy. No significant valvular abnormalities. Normal RV size and systolic function.      Radiology/Studies  Dg Chest 2 View  06/03/2014   CLINICAL DATA:  Mid right chest pain.  EXAM: CHEST  2 VIEW  COMPARISON:  02/11/2010  FINDINGS: The heart size and mediastinal contours are within normal limits. Both lungs are clear. The  visualized skeletal structures are unremarkable.  IMPRESSION: No active cardiopulmonary disease.   Electronically Signed   By: Rolm Baptise M.D.   On: 06/03/2014 19:17    ASSESSMENT AND PLAN  1. NSTEMI  - exertional sx for weeks, started to having CP at rest, trop 0.44 --> 0.89  - minimal ST elevation (<0.5 mm) in lateral leads  - continue heparin gtt, ASA, BB, statin. Pending echo today  - IV hydration, will need cath today or tomorrow, no LV gram with CKD  2. CAD  - NSTEMI 02/12/2010 with DES to 95% mid LCx, 60 prox & 70% mid LAD residual, 40-50% mid RCA residual  3. HTN 4. Dyslipidemia  - triglyceride >400, unable to calculate LDL due to high trig  - on 20mg  lipitor at home, will increase Lipitor to 80mg  5. CKD, stage III   Signed, Woodward Ku Pager: [phone]   The patient was seen, examined and discussed with Almyra Deforest, PA-C and I agree with the above.   69 year old male with known CAD, s/p NSTEMI in 2011, PCI to RCA, now coming with typical chest pain and ruled in for  ACS.  On Heparin drip, ASA, statin and metoprolol, currently chest pain free. Crea 1.5 with GFR 45, we will hydrate and plan for cath for tomorrow am, if recurrent CP we might call cath lab today.  TAG >400, LDL unable to calculate, we will increase atorvastatin to 80 mg po daily and will need to add fibrates post cath.   Dorothy Spark 06/04/2014

## 2014-06-04 NOTE — Progress Notes (Signed)
Utilization Review Completed.   Semaje Kinker, RN, BSN Nurse Case Manager  

## 2014-06-04 NOTE — Progress Notes (Signed)
  Echocardiogram 2D Echocardiogram has been performed.  [name] 06/04/2014, 11:24 AM

## 2014-06-04 NOTE — Progress Notes (Signed)
[facility] PROGRESS NOTE  [name] AJO:[id number] DOB: [birth year] DOA: 06/03/2014 PCP: Unice Cobble, MD  Assessment/Plan: 1. Non-ST segment elevation myocardial infarction -Has history of established coronary artery disease with non-ST segment elevation myocardial infarct in 2001, status post percutaneous intervention to left circumflex with a DES.  -Presented with complaints of chest pain located in right side. -Troponins peaking at 0.89 -Cardiology consulted, will likely be taken to the Cath Lab tomorrow morning -Will continue medical management with IV heparin, aspirin, beta blocker, sublingual nitroglycerin and statin.  2. Stage III chronic kidney disease -Have a baseline creatinine of 1.5 -Lab work showing stable kidney function  3. Hypertension -Blood pressures stable -Continue metoprolol 50 mg by mouth twice a day  4. Dyslipidemia -Continue Lipitor 80 mg by mouth daily  Code Status: Full Code Family Communication:  Disposition Plan: Anticipate Cath in am   Consultants:  Cardiology  HPI/Subjective: At the present time patient denies chest pain, reported not having chest pain since 6 PM yesterday.  Objective: Filed Vitals:   06/04/14 1040  BP: 133/70  Pulse: 62  Temp:   Resp:    No intake or output data in the 24 hours ending 06/04/14 1349 Filed Weights   06/03/14 1618 06/03/14 2112  Weight: 97.523 kg (215 lb) 95.709 kg (211 lb)    Exam:   General:  Patient is awake and alert, in no acute distress  Cardiovascular: Regular rate and rhythm normal S1-S2  Respiratory: Clear to auscultation bilaterally  Abdomen: Soft nontender nondistended  Musculoskeletal: No edema  Data Reviewed: Basic Metabolic Panel:  Recent Labs Lab 06/03/14 1630  NA 138  K 4.7  CL 101  CO2 27  GLUCOSE 93  BUN 18  CREATININE 1.51*  CALCIUM 10.1   Liver Function Tests: No results found for this basename: AST, ALT, ALKPHOS, BILITOT, PROT, ALBUMIN,  in the  last 168 hours No results found for this basename: LIPASE, AMYLASE,  in the last 168 hours No results found for this basename: AMMONIA,  in the last 168 hours CBC:  Recent Labs Lab 06/03/14 1630 06/04/14 0935  WBC 5.7 5.0  HGB 13.5 13.4  HCT 39.1 38.8*  MCV 81.8 83.4  PLT 168 122*   Cardiac Enzymes:  Recent Labs Lab 06/03/14 2212 06/04/14 0512 06/04/14 0935  TROPONINI 0.44* 0.89* 0.65*   BNP (last 3 results) No results found for this basename: PROBNP,  in the last 8760 hours CBG: No results found for this basename: GLUCAP,  in the last 168 hours  No results found for this or any previous visit (from the past 240 hour(s)).   Studies: Dg Chest 2 View  06/03/2014   CLINICAL DATA:  Mid right chest pain.  EXAM: CHEST  2 VIEW  COMPARISON:  02/11/2010  FINDINGS: The heart size and mediastinal contours are within normal limits. Both lungs are clear. The visualized skeletal structures are unremarkable.  IMPRESSION: No active cardiopulmonary disease.   Electronically Signed   By: Rolm Baptise M.D.   On: 06/03/2014 19:17    Scheduled Meds: . amLODipine  2.5 mg Oral Daily  . aspirin EC  81 mg Oral Daily  . atorvastatin  80 mg Oral q1800  . metoprolol  50 mg Oral BID   Continuous Infusions: . [START ON 06/05/2014] sodium chloride    . heparin 1,300 Units/hr (06/04/14 0124)    Principal Problem:   NSTEMI (non-ST elevated myocardial infarction) Active Problems:   HYPERTRIGLYCERIDEMIA, HX OF   HYPERLIPIDEMIA  HYPERTENSION   CORONARY ATHEROSCLEROSIS NATIVE CORONARY ARTERY   Chronic kidney disease (CKD), stage III (moderate)    Time spent: 35 min    Kelvin Cellar  [facility] Pager [phone]. If 7PM-7AM, please contact night-coverage at [email or web address], password [facility] 06/04/2014, 1:49 PM  LOS: 1 day

## 2014-06-04 NOTE — Progress Notes (Addendum)
ANTICOAGULATION CONSULT NOTE - F/u  Pharmacy Consult for heparin Indication: chest pain/ACS  Patient Measurements: Height: 6\' 4"  (193 cm) Weight: 211 lb (95.709 kg) IBW/kg (Calculated) : 86.8  Vital Signs: Temp: 98 F (36.7 C) (08/30 0500) BP: 133/70 mmHg (08/30 1040) Pulse Rate: 62 (08/30 1040)  Labs:  Recent Labs  06/03/14 1630 06/03/14 2212 06/04/14 0512 06/04/14 0935  HGB 13.5  --   --  13.4  HCT 39.1  --   --  38.8*  PLT 168  --   --  122*  HEPARINUNFRC  --   --   --  0.57  CREATININE 1.51*  --   --   --   TROPONINI  --  0.44* 0.89* 0.65*    Estimated Creatinine Clearance: 55.9 ml/min (by C-G formula based on Cr of 1.51).  Assessment: 68yo male continuing on heparin for CP/ACS (received lovenox 40mg  ~30 min prior to initiation). HL 0.57 therapeutic x1 @1300  units/hr. Previous history of PE, NSTEMI. Troponin trending up 0.44>>0.89. CBC wnl. No bleeding documented.  Goal of Therapy:  Heparin level 0.3-0.7 units/ml Monitor platelets by anticoagulation protocol: Yes   Plan:  -Continue Hep @1300  units/hr -6-hr HL @1800  -Daily HL/CBC -Monitor s/sx bleed  Elicia Lamp, PharmD Clinical Pharmacist - Resident Pager [phone] 06/04/2014 11:40 AM   =========================================   Addendum: - HL = 0.37 - no bleeding documented   Plan: - Increase heparin gtt slightly to 1350 units/hr - F/U AM labs   Eudelia Hiltunen D. Mina Marble, PharmD, BCPS Pager:  [phone] 06/04/2014, 7:23 PM

## 2014-06-05 ENCOUNTER — Encounter: Payer: Self-pay | Admitting: Physician Assistant

## 2014-06-05 ENCOUNTER — Inpatient Hospital Stay (HOSPITAL_COMMUNITY): Payer: Medicare HMO

## 2014-06-05 ENCOUNTER — Encounter (HOSPITAL_COMMUNITY)
Admission: EM | Disposition: A | Discharge status: Home / Self Care | Payer: Self-pay | Source: Home / Self Care | Attending: Internal Medicine

## 2014-06-05 DIAGNOSIS — Z8249 Family history of ischemic heart disease and other diseases of the circulatory system: Secondary | ICD-10-CM | POA: Diagnosis not present

## 2014-06-05 DIAGNOSIS — Z823 Family history of stroke: Secondary | ICD-10-CM | POA: Diagnosis not present

## 2014-06-05 DIAGNOSIS — I129 Hypertensive chronic kidney disease with stage 1 through stage 4 chronic kidney disease, or unspecified chronic kidney disease: Secondary | ICD-10-CM | POA: Diagnosis present

## 2014-06-05 DIAGNOSIS — Z85820 Personal history of malignant melanoma of skin: Secondary | ICD-10-CM | POA: Diagnosis not present

## 2014-06-05 DIAGNOSIS — Z7982 Long term (current) use of aspirin: Secondary | ICD-10-CM | POA: Diagnosis not present

## 2014-06-05 DIAGNOSIS — I214 Non-ST elevation (NSTEMI) myocardial infarction: Secondary | ICD-10-CM

## 2014-06-05 DIAGNOSIS — Z9861 Coronary angioplasty status: Secondary | ICD-10-CM | POA: Diagnosis not present

## 2014-06-05 DIAGNOSIS — E119 Type 2 diabetes mellitus without complications: Secondary | ICD-10-CM | POA: Diagnosis present

## 2014-06-05 DIAGNOSIS — Z79899 Other long term (current) drug therapy: Secondary | ICD-10-CM | POA: Diagnosis not present

## 2014-06-05 DIAGNOSIS — I2510 Atherosclerotic heart disease of native coronary artery without angina pectoris: Secondary | ICD-10-CM

## 2014-06-05 DIAGNOSIS — Z86711 Personal history of pulmonary embolism: Secondary | ICD-10-CM | POA: Diagnosis not present

## 2014-06-05 DIAGNOSIS — E785 Hyperlipidemia, unspecified: Secondary | ICD-10-CM | POA: Diagnosis present

## 2014-06-05 DIAGNOSIS — R079 Chest pain, unspecified: Secondary | ICD-10-CM | POA: Diagnosis present

## 2014-06-05 DIAGNOSIS — Z8546 Personal history of malignant neoplasm of prostate: Secondary | ICD-10-CM | POA: Diagnosis not present

## 2014-06-05 DIAGNOSIS — I6350 Cerebral infarction due to unspecified occlusion or stenosis of unspecified cerebral artery: Secondary | ICD-10-CM

## 2014-06-05 DIAGNOSIS — I639 Cerebral infarction, unspecified: Secondary | ICD-10-CM | POA: Insufficient documentation

## 2014-06-05 DIAGNOSIS — N183 Chronic kidney disease, stage 3 unspecified: Secondary | ICD-10-CM | POA: Diagnosis present

## 2014-06-05 DIAGNOSIS — I252 Old myocardial infarction: Secondary | ICD-10-CM | POA: Diagnosis not present

## 2014-06-05 HISTORY — PX: LEFT HEART CATHETERIZATION WITH CORONARY ANGIOGRAM: SHX5451

## 2014-06-05 LAB — CBC
HEMATOCRIT: 37.8 % — AB (ref 39.0–52.0)
Hemoglobin: 12.7 g/dL — ABNORMAL LOW (ref 13.0–17.0)
MCH: 28.5 pg (ref 26.0–34.0)
MCHC: 33.6 g/dL (ref 30.0–36.0)
MCV: 84.9 fL (ref 78.0–100.0)
Platelets: 127 10*3/uL — ABNORMAL LOW (ref 150–400)
RBC: 4.45 MIL/uL (ref 4.22–5.81)
RDW: 14 % (ref 11.5–15.5)
WBC: 4.3 10*3/uL (ref 4.0–10.5)

## 2014-06-05 LAB — BASIC METABOLIC PANEL
Anion gap: 10 (ref 5–15)
BUN: 21 mg/dL (ref 6–23)
CHLORIDE: 103 meq/L (ref 96–112)
CO2: 28 meq/L (ref 19–32)
Calcium: 9.5 mg/dL (ref 8.4–10.5)
Creatinine, Ser: 1.6 mg/dL — ABNORMAL HIGH (ref 0.50–1.35)
GFR calc non Af Amer: 42 mL/min — ABNORMAL LOW (ref >90)
GFR, EST AFRICAN AMERICAN: 49 mL/min — AB (ref >90)
Glucose, Bld: 100 mg/dL — ABNORMAL HIGH (ref 70–99)
Potassium: 4.4 mEq/L (ref 3.7–5.3)
Sodium: 141 mEq/L (ref 137–147)

## 2014-06-05 LAB — POCT ACTIVATED CLOTTING TIME
Activated Clotting Time: 230 s
Activated Clotting Time: 242 s

## 2014-06-05 LAB — PROTIME-INR
INR: 0.99 (ref 0.00–1.49)
PROTHROMBIN TIME: 13.1 s (ref 11.6–15.2)

## 2014-06-05 LAB — GLUCOSE, CAPILLARY: Glucose-Capillary: 84 mg/dL (ref 70–99)

## 2014-06-05 LAB — HEPARIN LEVEL (UNFRACTIONATED): Heparin Unfractionated: 0.33 IU/mL (ref 0.30–0.70)

## 2014-06-05 SURGERY — LEFT HEART CATHETERIZATION WITH CORONARY ANGIOGRAM
Anesthesia: LOCAL

## 2014-06-05 MED ORDER — PRASUGREL HCL 10 MG PO TABS
ORAL_TABLET | ORAL | Status: AC
  Filled 2014-06-05: qty 1

## 2014-06-05 MED ORDER — NITROGLYCERIN 1 MG/10 ML FOR IR/CATH LAB
INTRA_ARTERIAL | Status: AC
  Filled 2014-06-05: qty 10

## 2014-06-05 MED ORDER — HEPARIN SODIUM (PORCINE) 5000 UNIT/ML IJ SOLN
5000.0000 [IU] | Freq: Three times a day (TID) | INTRAMUSCULAR | Status: DC
  Administered 2014-06-05 – 2014-06-08 (×7): 5000 [IU] via SUBCUTANEOUS
  Filled 2014-06-05 (×10): qty 1

## 2014-06-05 MED ORDER — PRASUGREL HCL 10 MG PO TABS: 10.0000 mg | ORAL_TABLET | Freq: Every day | ORAL | Status: DC

## 2014-06-05 MED ORDER — IOHEXOL 350 MG/ML SOLN
80.0000 mL | Freq: Once | INTRAVENOUS | Status: AC | PRN
  Administered 2014-06-05: 80 mL via INTRAVENOUS

## 2014-06-05 MED ORDER — SODIUM CHLORIDE 0.9 % IV SOLN: 250.0000 mL | INTRAVENOUS | Status: DC | PRN

## 2014-06-05 MED ORDER — ADENOSINE 12 MG/4ML IV SOLN
16.0000 mL | INTRAVENOUS | Status: DC
  Filled 2014-06-05: qty 16

## 2014-06-05 MED ORDER — FENTANYL CITRATE 0.05 MG/ML IJ SOLN
INTRAMUSCULAR | Status: AC
  Filled 2014-06-05: qty 2

## 2014-06-05 MED ORDER — HEPARIN (PORCINE) IN NACL 2-0.9 UNIT/ML-% IJ SOLN
INTRAMUSCULAR | Status: AC
  Filled 2014-06-05: qty 1000

## 2014-06-05 MED ORDER — CLOPIDOGREL BISULFATE 75 MG PO TABS
75.0000 mg | ORAL_TABLET | Freq: Every day | ORAL | Status: DC
  Administered 2014-06-06 – 2014-06-08 (×3): 75 mg via ORAL
  Filled 2014-06-05 (×3): qty 1

## 2014-06-05 MED ORDER — SODIUM CHLORIDE 0.9 % IJ SOLN: 3.0000 mL | INTRAMUSCULAR | Status: DC | PRN

## 2014-06-05 MED ORDER — HEPARIN SODIUM (PORCINE) 1000 UNIT/ML IJ SOLN
INTRAMUSCULAR | Status: AC
  Filled 2014-06-05: qty 1

## 2014-06-05 MED ORDER — SODIUM CHLORIDE 0.9 % IJ SOLN
3.0000 mL | Freq: Two times a day (BID) | INTRAMUSCULAR | Status: DC
  Administered 2014-06-06 – 2014-06-08 (×4): 3 mL via INTRAVENOUS

## 2014-06-05 MED ORDER — PRASUGREL HCL 10 MG PO TABS
ORAL_TABLET | ORAL | Status: AC
  Filled 2014-06-05: qty 5

## 2014-06-05 MED ORDER — MIDAZOLAM HCL 2 MG/2ML IJ SOLN
INTRAMUSCULAR | Status: AC
  Filled 2014-06-05: qty 2

## 2014-06-05 MED ORDER — SODIUM CHLORIDE 0.9 % IV SOLN
1.0000 mL/kg/h | INTRAVENOUS | Status: AC
  Administered 2014-06-05 – 2014-06-06 (×2): 1 mL/kg/h via INTRAVENOUS

## 2014-06-05 MED ORDER — ADENOSINE 12 MG/4ML IV SOLN
16.0000 mL | INTRAVENOUS | Status: AC
  Administered 2014-06-05: 48 mg via INTRAVENOUS
  Filled 2014-06-05: qty 16

## 2014-06-05 MED ORDER — STROKE: EARLY STAGES OF RECOVERY BOOK
Freq: Once | Status: AC
  Administered 2014-06-06: 14:00:00
  Filled 2014-06-05: qty 1

## 2014-06-05 MED ORDER — LIDOCAINE HCL (PF) 1 % IJ SOLN
INTRAMUSCULAR | Status: AC
  Filled 2014-06-05: qty 30

## 2014-06-05 NOTE — Progress Notes (Signed)
Patient arrived via bed @ 1735. RN and PCA escorted patient to new room. Wife at bedside. Patient with slight confusion, trying to understand why he can't get OOB to urinate. Urinal provided. VSS see flowsheet. Arm band on right radial artery post cath from today. Will proceed in releasing air from site. Patient tolerated transfer well. Swallow study to be done. Patient c/o bad headache. Will medicate after swallow study. Patient alert and oriented x 2. Speech clear, pupils reactive, patient follows commands. Will follow closely.

## 2014-06-05 NOTE — Progress Notes (Signed)
Patient passed stroke swallow study. Asking for water, will medicate with PRN meds for H/A. Monitoring closely.

## 2014-06-05 NOTE — CV Procedure (Signed)
Cardiac Catheterization Procedure Note  Name: [name] MRN: [id number] DOB: [birth year]  Procedure: Left Heart Cath, Selective Coronary Angiography, FFR and stenting of the proximal LAD, FFR of the RCA  Indication: NSTEMI  Procedural Details:  The right wrist was prepped, draped, and anesthetized with 1% lidocaine. Using the modified Seldinger technique, a 5/6 French Slender sheath was introduced into the right radial artery. 3 mg of verapamil was administered through the sheath, weight-based unfractionated heparin was administered intravenously. Standard Judkins catheters were used for selective coronary angiography. Catheter exchanges were performed over an exchange length guidewire.  PROCEDURAL FINDINGS Hemodynamics: AO 110/65 LV 106/15   Coronary angiography: Coronary dominance: right  Left mainstem: The left main arises from the left coronary cusp. The vessel has mild irregularity and it divides into the LAD and left circumflex.  Left anterior descending (LAD): The LAD has a moderate 70% proximal vessel stenosis. The lesion is hypodense. The mid vessel has a 50% stenosis. The diagonal branches are patent, but the first diagonal has a 75% ostial stenosis. This is a small caliber vessel.  Left circumflex (LCx): The left circumflex is patent without significant obstruction. The stented segment in the mid circumflex is patent without any significant restenosis. There were 2 obtuse marginal branches without significant disease the  Right coronary artery (RCA): The RCA is dominant. The vessel has mild proximal plaquing with about 30% stenosis. The mid vessel has 50-60% stenosis followed by an area of dilatation. The distal vessel is patent. The PDA has minor irregularity. The PLA branch is patent  Left ventriculography: Deferred because of chronic kidney disease  PCI Note:  Following the diagnostic procedure, the decision was made to proceed with pressure wire analysis and  possible PCI of the LAD. The vessel had moderately severe proximal stenosis and moderate mid vessel stenosis. I also planned on performing pressure wire analysis of the mid RCA which had moderate stenosis in an area of angulation in the vessel. Attention was first turned to the LAD. Weight-based heparin was given for anticoagulation. Once a therapeutic ACT was achieved, a 6 [place] XB LAD 3.0 cm guide catheter was inserted.  A cougar coronary guidewire was used to cross the lesion.  A Navvus catheter was advanced beyond the lesions in the proximal and mid vessel after passing a cougar wire. Intravenous adenosine was administered. The FFR was 0.74. A pullback was performed and the vast majority of the gradient was over the proximal lesion. I decided to treat the mid lesion medically. The patient was loaded with effient 60 mg. The proximal lesion was primarily stented with a 3.5 x 16 mm from his DES. The stent was deployed at 16 atmospheres and it appeared very well expanded. The stent was postdilated with a 3.75 x 15 mm noncompliant balloon to 16 atmospheres. There was an excellent angiographic result with 0% residual stenosis and TIMI-3 flow. Attention was then turned to the RCA. A JR 4 guide catheter was utilized. The same cougar wire was used to advance beyond the lesion in the mid vessel. Intravenous adenosine was again administered after passing the same Navvus catheter. The FFR at peak hyperemia was 0.82, therefore medical therapy will be recommended. The patient is transferred directly to the post cardiac catheterization recovery area.  PCI Data: Vessel - LAD/Segment - proximal Percent Stenosis (pre)  70 (FFR 0.74) TIMI-flow 3 Stent 3.5 x 16 mm Promus DES Percent Stenosis (post) 0 TIMI-flow (post) 3  Contrast: 80 cc Omnipaque  Radiation dose/Fluoro  time: 8.2 minutes  Estimated Blood Loss: Minimal  Final Conclusions:   1. Three-vessel coronary artery disease with continued patency of the stented  segment in the mid left circumflex, severe stenosis of the proximal LAD confirmed by pressure wire analysis, and moderate nonobstructive stenosis of the mid right coronary artery also evaluated by pressure wire analysis.  2. No normal LV function by echocardiography with LVEF of 55-60%.  3. Successful PCI of the proximal LAD with a single drug-eluting stent platform.   Recommendations:  Dual antiplatelet therapy with aspirin and effient for 12 months. Aggressive medical therapy.  Sherren Mocha MD, [facility] 06/05/2014, 2:39 PM

## 2014-06-05 NOTE — Consult Note (Signed)
Referring Physician: Coralyn Pear    Chief Complaint: Code stroke  HPI:                                                                                                                                         [name] is an 68 y.o. male who underwent a left heart catheterization and stenting.  S/P procedure patient was noted to not be responding correctly to wife and code stroke was called.  On arrival patient is alert but confused at where he is and having a hard time following simple verbal commands.  Due to receiving large dose of Effient during cardiac stenting, he was not a tPA candidate.  Patient was brought CT for CTA head and neck to evaluate for possible intervention.   Date last known well: Date: 06/05/2014 Time last known well: Time: 13:25 tPA Given: No: patient was loaded with effient 60 mg   Past Medical History  Diagnosis Date  . Hypertension     did not tolerate Lisinopril event at low dose  . Hyperlipidemia   . Anemia, mild   . Vitamin B12 deficiency   . Prostate cancer   . CAD (coronary artery disease)     a. NSTEMI 5/11. b.  LHC: 60% pLAD, 70% mLAD, 95% mRCA w/ TIMI 2 flow distally, EF 60%.  Patient had PROMUS DES to [facility]. c. cath 06/05/14: LM mild irregs, pLAD 70%--> s/p PCI/DES (Promus DES), mLAD 50%, D1 75% ostial, LCx w/o significant obs, mLCx stent w/o restenosis, pRCA  30%, mRCA 50-60%  . Renal insufficiency   . Diastolic dysfunction     a. Echo 6/11: mild LVH, EF 55-60%, GR1DD, Trivial MR, mild LAE. b. echo 06/04/14: EF 55-60%, no WMA, GR1DD, Ao valve mildly calcifed, mild MR, mild LAE  . Melanoma     a. 1987. b. 2012  . Pulmonary embolism     Past Surgical History  Procedure Laterality Date  . Cholecystectomy    . Prostatectomy      Dr. Karsten Ro  . Knee arthroscopy      right  . Tonsillectomy    . Mrca stent  02/2010  . Melanoma excision  2011     L chest  . Melanoma excision  1991    back    Family History  Problem Relation Age of Onset  . Coronary  artery disease Mother     S/P CABG, in her 6s  . Stroke Mother 58  . Prostate cancer Father   . Hypertension Brother   . Diabetes Neg Hx    Social History:  reports that he has never smoked. He does not have any smokeless tobacco history on file. He reports that he does not drink alcohol or use illicit drugs.  Allergies:  Allergies  Allergen Reactions  . Lidocaine     Syncope post palmer injection; probably vasovagal as no reaction to intraarticular  Lidocaine into knee    Medications:                                                                                                                           Prior to Admission:  Prescriptions prior to admission  Medication Sig Dispense Refill  . amLODipine (NORVASC) 2.5 MG tablet TAKE 1 TABLET BY MOUTH DAILY  90 tablet  2  . aspirin EC 81 MG tablet Take two tablets daily      . atorvastatin (LIPITOR) 20 MG tablet Take 20 mg by mouth daily.      . calcium carbonate (TUMS - DOSED IN MG ELEMENTAL CALCIUM) 500 MG chewable tablet Chew 2 tablets by mouth daily.      . fish oil-omega-3 fatty acids 1000 MG capsule Take 2 g by mouth 2 (two) times daily.        . metoprolol (LOPRESSOR) 50 MG tablet Take 50 mg by mouth 2 (two) times daily.      . Potassium Chloride (K+ POTASSIUM PO) Take 1 tablet by mouth daily.        Scheduled: . amLODipine  2.5 mg Oral Daily  . aspirin EC  81 mg Oral Daily  . atorvastatin  80 mg Oral q1800  . metoprolol  50 mg Oral BID  . [START ON 06/06/2014] prasugrel  10 mg Oral Daily  . [START ON 06/06/2014] sodium chloride  3 mL Intravenous Q12H    ROS:                                                                                                                                       History obtained from unobtainable from patient due to language barrier  Neurologic Examination:                                                                                                      Blood pressure 127/64, pulse 59,  temperature 98.6 F (37 C),  temperature source Oral, resp. rate 16, height 6\' 4"  (1.93 m), weight 95.709 kg (211 lb), SpO2 96.00%.  General: NAD Mental Status: Alert, able to follow simple visual commands but shows difficulty following verbal commands.  Speech shows both expressive and receptive aphasia.   Cranial Nerves: II: Discs flat bilaterally; Visual fields grossly normal, pupils equal, round, reactive to light and accommodation III,IV, VI: ptosis not present, extra-ocular motions intact bilaterally V,VII: smile symmetric, facial light touch sensation normal bilaterally VIII: hearing normal bilaterally IX,X: gag reflex present XI: bilateral shoulder shrug XII: midline tongue extension without atrophy or fasciculations  Motor: Right : Upper extremity   5/5    Left:     Upper extremity   5/5  Lower extremity   5/5     Lower extremity   5/5 Tone and bulk:normal tone throughout; no atrophy noted Sensory: Pinprick and light touch intact throughout, bilaterally Deep Tendon Reflexes:  Right: Upper Extremity   Left: Upper extremity   biceps (C-5 to C-6) 2/4   biceps (C-5 to C-6) 2/4 tricep (C7) 2/4    triceps (C7) 2/4 Brachioradialis (C6) 2/4  Brachioradialis (C6) 2/4  Lower Extremity Lower Extremity  quadriceps (L-2 to L-4) 2/4   quadriceps (L-2 to L-4) 2/4 Achilles (S1) 2/4   Achilles (S1) 2/4  Plantars: Right: downgoing   Left: downgoing Cerebellar: normal finger-to-nose,  normal heel-to-shin test Gait: not tested CV: pulses palpable throughout    Lab Results: Basic Metabolic Panel:  Recent Labs Lab 06/03/14 1630 06/05/14 0451  NA 138 141  K 4.7 4.4  CL 101 103  CO2 27 28  GLUCOSE 93 100*  BUN 18 21  CREATININE 1.51* 1.60*  CALCIUM 10.1 9.5    Liver Function Tests: No results found for this basename: AST, ALT, ALKPHOS, BILITOT, PROT, ALBUMIN,  in the last 168 hours No results found for this basename: LIPASE, AMYLASE,  in the last 168 hours No results found  for this basename: AMMONIA,  in the last 168 hours  CBC:  Recent Labs Lab 06/03/14 1630 06/04/14 0935 06/05/14 0451  WBC 5.7 5.0 4.3  HGB 13.5 13.4 12.7*  HCT 39.1 38.8* 37.8*  MCV 81.8 83.4 84.9  PLT 168 122* 127*    Cardiac Enzymes:  Recent Labs Lab 06/03/14 2212 06/04/14 0512 06/04/14 0935  TROPONINI 0.44* 0.89* 0.65*    Lipid Panel:  Recent Labs Lab 06/04/14 0512  CHOL 122  TRIG 440*  HDL 32*  CHOLHDL 3.8  VLDL UNABLE TO CALCULATE IF TRIGLYCERIDE OVER 400 mg/dL  LDLCALC UNABLE TO CALCULATE IF TRIGLYCERIDE OVER 400 mg/dL    CBG:  Recent Labs Lab 06/05/14 1541  GLUCAP 84    Microbiology: No results found for this or any previous visit.  Coagulation Studies:  Recent Labs  06/05/14 0451  LABPROT 13.1  INR 0.99    Imaging: Dg Chest 2 View  06/03/2014   CLINICAL DATA:  Mid right chest pain.  EXAM: CHEST  2 VIEW  COMPARISON:  02/11/2010  FINDINGS: The heart size and mediastinal contours are within normal limits. Both lungs are clear. The visualized skeletal structures are unremarkable.  IMPRESSION: No active cardiopulmonary disease.   Electronically Signed   By: Rolm Baptise M.D.   On: 06/03/2014 19:17   Ct Head Wo Contrast  06/05/2014   CLINICAL DATA:  Confusion. A ectasia. Headache. Status post cardiac catheterization today.  EXAM: CT HEAD WITHOUT CONTRAST  TECHNIQUE: Contiguous axial images were obtained from the base of the skull  through the vertex without intravenous contrast.  COMPARISON:  None.  FINDINGS: Ventricles are normal in configuration. There is mild ventricular and sulcal enlargement consistent with age related volume loss. There are no parenchymal masses or mass effect. Minor periventricular white matter hypoattenuation is noted consistent with chronic microvascular ischemic change.  There is no evidence of an infarct.  No extra-axial masses or abnormal fluid collections.  There is no intracranial hemorrhage.  Sinuses and mastoid air cells  are clear.  IMPRESSION: 1. No acute intracranial abnormalities. 2. Age related volume loss and minor chronic microvascular ischemic change. These results were called by telephone at the time of interpretation on 06/05/2014 at 4:08 pm to Dr. Doy Mince, who verbally acknowledged these results.   Electronically Signed   By: Lajean Manes M.D.   On: 06/05/2014 16:09    Etta Quill PA-C [facility] [phone]  06/05/2014, 4:29 PM   Patient seen and examined.  Clinical course and management discussed.  Necessary edits performed.  I agree with the above.  Assessment and plan of care developed and discussed below.     Assessment: 68 y.o. male with new onset expressive aphasia S/P cardiac catheterization and stenting.  Initial CT head reviewed and shows no acute changes.   Loading dose of Effient received during the procedure and therefore patient is not a tPA candidate.  CTA head and neck performed to rule out the possibility of a thrombus that may be amenable to intervention.   After review with radiology it was determined that no such lesion was present.    Stroke Risk Factors - hyperlipidemia, hypertension and CAD  Recommendations: 1. HgbA1c, fasting lipid panel 2. PT consult, OT consult, Speech consult 3. Prophylactic therapy-Antiplatelet med: Plavix - dose 75 mg daily 4. Risk factor modification 5. Telemetry monitoring 6. Frequent neuro checks  Case discussed with Dr. Burt Knack and Dr. Charlcie Cradle, MD [facility] [phone]  06/05/2014  5:45 PM

## 2014-06-05 NOTE — Progress Notes (Signed)
Notified of patient's confusion post-procedure. Code Stroke called. Returned to evaluate patient and had lengthy discussion with the patient's wife and sister-in-law. Neuro team actively evaluating the patient who just had head CT and now is going down for stat CTA of the brain. Primary deficit is confusion. Appreciate neurology guidance.   Regardless of finding on CTA, will plan on stopping Effient and transitioning to plavix tomorrow am.  Sherren Mocha MD 06/05/2014 4:57 PM

## 2014-06-05 NOTE — Progress Notes (Signed)
(  late entry)  See previous notes by care team.  Patient arrived at [place] at 1500,  somewhat sedated post procedure.  After settling patient in, C/O headache 8/10.  Patient seemed less and less clear of situation; was repeating questions already answered.  Impaired memory and mild aphasia noted. Confirmed change in mental status with family when they arrived at 44.  Initiated call to rapid response Lilyan Punt, Therapist, sports) and cariology PA Tarri Fuller)  Code stroke called and patient seen by Deirdre Peer, stroke RN, Theodosia Paling, neuro PA, Sherren Mocha, Cardiology MD, Dr. Doy Mince (neuro), and Dr. Coralyn Pear (attending).  After completing initial CT scan, report called and patient transported to [place].

## 2014-06-05 NOTE — Progress Notes (Signed)
Patient: [name] / Admit Date: 06/03/2014 / Date of Encounter: 06/05/2014, 8:36 AM   Subjective: No chest pain, palpitations, SOB, or dyspnea. For cardiac cath today.    Objective: Telemetry: NSR, 80s. Short run of sinus tach, 130s at 8:30 this AM.  Physical Exam: Blood pressure 127/64, pulse 58, temperature 98.6 F (37 C), temperature source Oral, resp. rate 16, height 6\' 4"  (1.93 m), weight 211 lb (95.709 kg), SpO2 96.00%. General: Well developed, well nourished, in no acute distress. Head: Normocephalic, atraumatic, sclera non-icteric, no xanthomas, nares are without discharge. Neck: Negative for carotid bruits. JVP not elevated. Lungs: Clear bilaterally to auscultation without wheezes, rales, or rhonchi. Breathing is unlabored. Heart: RRR S1 S2 without murmurs, rubs, or gallops.  Abdomen: Soft, non-tender, non-distended with normoactive bowel sounds. No rebound/guarding. Extremities: No clubbing or cyanosis. No edema. Distal pedal pulses are 2+ and equal bilaterally. Neuro: Alert and oriented X 3. Moves all extremities spontaneously. Psych:  Responds to questions appropriately with a normal affect.  No intake or output data in the 24 hours ending 06/05/14 0836  Inpatient Medications:  . amLODipine  2.5 mg Oral Daily  . aspirin EC  81 mg Oral Daily  . atorvastatin  80 mg Oral q1800  . metoprolol  50 mg Oral BID  . sodium chloride  3 mL Intravenous Q12H   Infusions:  . sodium chloride 100 mL/hr at 06/05/14 0359  . heparin 1,350 Units/hr (06/04/14 2007)    Labs:  Recent Labs  06/03/14 1630 06/05/14 0451  NA 138 141  K 4.7 4.4  CL 101 103  CO2 27 28  GLUCOSE 93 100*  BUN 18 21  CREATININE 1.51* 1.60*  CALCIUM 10.1 9.5   No results found for this basename: AST, ALT, ALKPHOS, BILITOT, PROT, ALBUMIN,  in the last 72 hours  Recent Labs  06/04/14 0935 06/05/14 0451  WBC 5.0 4.3  HGB 13.4 12.7*  HCT 38.8* 37.8*  MCV 83.4 84.9  PLT 122* 127*    Recent  Labs  06/03/14 2212 06/04/14 0512 06/04/14 0935  TROPONINI 0.44* 0.89* 0.65*   No components found with this basename: POCBNP,   Recent Labs  06/04/14 0512  HGBA1C 5.8*     Radiology/Studies:  Dg Chest 2 View  06/03/2014   CLINICAL DATA:  Mid right chest pain.  EXAM: CHEST  2 VIEW  COMPARISON:  02/11/2010  FINDINGS: The heart size and mediastinal contours are within normal limits. Both lungs are clear. The visualized skeletal structures are unremarkable.  IMPRESSION: No active cardiopulmonary disease.   Electronically Signed   By: Rolm Baptise M.D.   On: 06/03/2014 19:17     Assessment and Plan  68 y.o. male with h/o CAD with NSTEMI 5/11 s/p PCI/DES to mRCA, HTN, HLD, CKD stage III who presented to [facility] on 06/03/14 with typical chest pain and ruled in for ACS. He is for cardiac catheterization today. Currently chest pain free.   1. NSTEMI -Cardiac cath today-limit dye 2/2 CKD -Troponin 0.44-->0.89-->0.65 -EKG with <0.5 mm ST elevation in lateral leads, no change from previous tracings  -On heparin gtt, aspirin, b-blocker, statin -Echo EF 55-60%, no regional WMA,G1DD, Ao valve: Trileaflet; normal thickness, mildly calcified leaflets, mild mitral regurgitation, left atrium mildly dilated  -IV hydration (SCr @ baseline 1.60)    2. CAD -s/p NSTEMI 02/12/2010 with DES to 95% mid LCx, 60 prox & 70% mid LAD residual, 40-50% mid RCA residual -See #1 for med details  3.  HTN -Controlled -Continue Norvasc 2.5 mg and Lopressor 50 mg bid   4. HLD -Lipitor recently increased to 80 mg from 20 mg home dose -TC 122, LDL unable to calc, HDL 32, trigs 440  5. CKD stage III -SCr at baseline (1.60) -Continue IV fluids, limit dye, no LV gram during cath       Signed, Christell Faith, PA-C 06/05/2014 8:53 AM  As above, patient seen and examined. He has had no further chest pain. Enzymes are mildly elevated consistent with non-ST elevation myocardial infarction. LV function is preserved. Continue  aspirin, statin, heparin and beta blocker. Proceed with cardiac catheterization today. The risks and benefits were discussed and he agrees to proceed. Note creatinine is 1.6. He is being hydrated prior to the procedure. Limit dye and no ventriculogram. Follow renal function closely after procedure. Kirk Ruths

## 2014-06-05 NOTE — Progress Notes (Addendum)
ANTICOAGULATION CONSULT NOTE   Pharmacy Consult for heparin Indication: chest pain/ACS  Patient Measurements: Height: 6\' 4"  (193 cm) Weight: 211 lb (95.709 kg) IBW/kg (Calculated) : 86.8  Vital Signs: Temp: 98.6 F (37 C) (08/31 0500) BP: 127/64 mmHg (08/31 0500) Pulse Rate: 58 (08/31 0500)  Labs:  Recent Labs  06/03/14 1630 06/03/14 2212 06/04/14 0512 06/04/14 0935 06/04/14 1841 06/05/14 0451  HGB 13.5  --   --  13.4  --  12.7*  HCT 39.1  --   --  38.8*  --  37.8*  PLT 168  --   --  122*  --  127*  LABPROT  --   --   --   --   --  13.1  INR  --   --   --   --   --  0.99  HEPARINUNFRC  --   --   --  0.57 0.37 0.33  CREATININE 1.51*  --   --   --   --  1.60*  TROPONINI  --  0.44* 0.89* 0.65*  --   --     Estimated Creatinine Clearance: 52.7 ml/min (by C-G formula based on Cr of 1.6).  Assessment: 68yo male continuing on heparin for CP/ACS. Heparin continues to be at goal this morning. Previous history of PE, NSTEMI.  CBC wnl. No bleeding documented. Plan for cath today.  Goal of Therapy:  Heparin level 0.3-0.7 units/ml Monitor platelets by anticoagulation protocol: Yes   Plan:  -Continue Hep @1350  units/hr -Daily HL/CBC -Monitor s/sx bleed -Follow up after cath  Erin Hearing PharmD., BCPS Clinical Pharmacist Pager [phone] 06/05/2014 10:02 AM

## 2014-06-05 NOTE — Progress Notes (Signed)
Called to patient's room by RN.  Patient complaining of headache.  He is alert but confuse and not oriented to place, self, why he is here.  CBG 84.  BP and HR stable.  CN II-XII appear intact. Strength 5/5 and equal.  Code stroke called.   Tarri Fuller PAC

## 2014-06-05 NOTE — H&P (View-Only) (Signed)
Patient: [name] / Admit Date: 06/03/2014 / Date of Encounter: 06/05/2014, 8:36 AM   Subjective: No chest pain, palpitations, SOB, or dyspnea. For cardiac cath today.    Objective: Telemetry: NSR, 80s. Short run of sinus tach, 130s at 8:30 this AM.  Physical Exam: Blood pressure 127/64, pulse 58, temperature 98.6 F (37 C), temperature source Oral, resp. rate 16, height 6\' 4"  (1.93 m), weight 211 lb (95.709 kg), SpO2 96.00%. General: Well developed, well nourished, in no acute distress. Head: Normocephalic, atraumatic, sclera non-icteric, no xanthomas, nares are without discharge. Neck: Negative for carotid bruits. JVP not elevated. Lungs: Clear bilaterally to auscultation without wheezes, rales, or rhonchi. Breathing is unlabored. Heart: RRR S1 S2 without murmurs, rubs, or gallops.  Abdomen: Soft, non-tender, non-distended with normoactive bowel sounds. No rebound/guarding. Extremities: No clubbing or cyanosis. No edema. Distal pedal pulses are 2+ and equal bilaterally. Neuro: Alert and oriented X 3. Moves all extremities spontaneously. Psych:  Responds to questions appropriately with a normal affect.  No intake or output data in the 24 hours ending 06/05/14 0836  Inpatient Medications:  . amLODipine  2.5 mg Oral Daily  . aspirin EC  81 mg Oral Daily  . atorvastatin  80 mg Oral q1800  . metoprolol  50 mg Oral BID  . sodium chloride  3 mL Intravenous Q12H   Infusions:  . sodium chloride 100 mL/hr at 06/05/14 0359  . heparin 1,350 Units/hr (06/04/14 2007)    Labs:  Recent Labs  06/03/14 1630 06/05/14 0451  NA 138 141  K 4.7 4.4  CL 101 103  CO2 27 28  GLUCOSE 93 100*  BUN 18 21  CREATININE 1.51* 1.60*  CALCIUM 10.1 9.5   No results found for this basename: AST, ALT, ALKPHOS, BILITOT, PROT, ALBUMIN,  in the last 72 hours  Recent Labs  06/04/14 0935 06/05/14 0451  WBC 5.0 4.3  HGB 13.4 12.7*  HCT 38.8* 37.8*  MCV 83.4 84.9  PLT 122* 127*    Recent  Labs  06/03/14 2212 06/04/14 0512 06/04/14 0935  TROPONINI 0.44* 0.89* 0.65*   No components found with this basename: POCBNP,   Recent Labs  06/04/14 0512  HGBA1C 5.8*     Radiology/Studies:  Dg Chest 2 View  06/03/2014   CLINICAL DATA:  Mid right chest pain.  EXAM: CHEST  2 VIEW  COMPARISON:  02/11/2010  FINDINGS: The heart size and mediastinal contours are within normal limits. Both lungs are clear. The visualized skeletal structures are unremarkable.  IMPRESSION: No active cardiopulmonary disease.   Electronically Signed   By: Rolm Baptise M.D.   On: 06/03/2014 19:17     Assessment and Plan  68 y.o. male with h/o CAD with NSTEMI 5/11 s/p PCI/DES to mRCA, HTN, HLD, CKD stage III who presented to [facility] on 06/03/14 with typical chest pain and ruled in for ACS. He is for cardiac catheterization today. Currently chest pain free.   1. NSTEMI -Cardiac cath today-limit dye 2/2 CKD -Troponin 0.44-->0.89-->0.65 -EKG with <0.5 mm ST elevation in lateral leads, no change from previous tracings  -On heparin gtt, aspirin, b-blocker, statin -Echo EF 55-60%, no regional WMA,G1DD, Ao valve: Trileaflet; normal thickness, mildly calcified leaflets, mild mitral regurgitation, left atrium mildly dilated  -IV hydration (SCr @ baseline 1.60)    2. CAD -s/p NSTEMI 02/12/2010 with DES to 95% mid LCx, 60 prox & 70% mid LAD residual, 40-50% mid RCA residual -See #1 for med details  3.  HTN -Controlled -Continue Norvasc 2.5 mg and Lopressor 50 mg bid   4. HLD -Lipitor recently increased to 80 mg from 20 mg home dose -TC 122, LDL unable to calc, HDL 32, trigs 440  5. CKD stage III -SCr at baseline (1.60) -Continue IV fluids, limit dye, no LV gram during cath       Signed, Christell Faith, PA-C 06/05/2014 8:53 AM  As above, patient seen and examined. He has had no further chest pain. Enzymes are mildly elevated consistent with non-ST elevation myocardial infarction. LV function is preserved. Continue  aspirin, statin, heparin and beta blocker. Proceed with cardiac catheterization today. The risks and benefits were discussed and he agrees to proceed. Note creatinine is 1.6. He is being hydrated prior to the procedure. Limit dye and no ventriculogram. Follow renal function closely after procedure. Kirk Ruths

## 2014-06-05 NOTE — Interval H&P Note (Signed)
History and Physical Interval Note:  06/05/2014 12:55 PM  [name]  has presented today for surgery, with the diagnosis of chest pain  The various methods of treatment have been discussed with the patient and family. After consideration of risks, benefits and other options for treatment, the patient has consented to  Procedure(s): LEFT HEART CATHETERIZATION WITH CORONARY ANGIOGRAM (N/A) as a surgical intervention .  The patient's history has been reviewed, patient examined, no change in status, stable for surgery.  I have reviewed the patient's chart and labs.  Questions were answered to the patient's satisfaction.    Cath Lab Visit (complete for each Cath Lab visit)  Clinical Evaluation Leading to the Procedure:   ACS: Yes.    Non-ACS:    Anginal Classification: CCS IV  Anti-ischemic medical therapy: Maximal Therapy (2 or more classes of medications)  Non-Invasive Test Results: No non-invasive testing performed  Prior CABG: No previous CABG       Sherren Mocha

## 2014-06-05 NOTE — Code Documentation (Signed)
68yo male s/p cardiac cath with stent placement.  LKW 1325 at the start of the case.  Patient with conversational speech during and after the case.  Patient admitted to unit [facility] and patient was noticed to be confused with difficulty understanding.  Code Stroke called and stroke team responded.  Patient taken to CT.  NIHSS 4 for inability to answer questions correctly and aphasia.  Dr. Doy Mince to bedside to assess.  Patient is not a candidate for tPA per MD.  Patient taken to CTA head and neck per MD.  Patient back to [facility], bedside handoff with Clair Gulling, RN.

## 2014-06-05 NOTE — Progress Notes (Signed)
[facility] PROGRESS NOTE  [name] DJM:[id number] DOB: 11/13/1945 DOA: 06/03/2014 PCP: Unice Cobble, MD  Interim Summary Patient is a pleasant 68 year old gentleman with established history of coronary artery disease, admitted to the medicine service on 06/03/2014 presenting with complaints of chest pain. He described chest pain as right-sided and pressure like. He was admitted to [facility] for chest pain rule out. His troponin peaked at 0.89 and was started on IV heparin for NSTEMI. Cardiology was consulted. On 06/05/2014 he was taken to the Cath Lab, where cardiac catheterization revealed three-vessel coronary disease with patency of the stented segment of the mid circumflex. There was severe stenosis of the proximal LAD. Had percutaneous intervention to the proximal LAD lesion with a single drug-eluting stent. Immediately post procedure he appears to be doing well, answering questions appropriately in the cath lab. However, when he arrived to [facility] he had sudden onset confusion, having difficulty responding to questions or following commands. Patient was evaluated by Dr. Doy Mince of neurology,  Who recommended obtaining a CTA. Patient was transferred to [facility]. family members present at bedside were updated. Assessment/Plan: 1. Suspected CVA -Patient undergoing cardiac catheterization today with percutaneous intervention to the LAD lesion -Postprocedure he developed sudden onset confusion, disorientation, trouble following commands.  -Neurology consulted, recommending CTA -Will perform Neuro checks, place patient on CVA protocol                                                                                                                                                                                     2. Non-ST segment elevation myocardial infarction -Has history of established coronary artery disease with non-ST segment elevation myocardial infarct in 2001, status post percutaneous intervention  to left circumflex with a DES.  -Presented with complaints of chest pain located in right side. -Troponins peaking at 0.89 -Status post cardiac catheterization on 06/05/2014 which showed severe stenosis of proximal LAD, status post percutaneous intervention with single drug-eluting stent -Cardiology recommending aspirin and effient for 12 months  2. Stage III chronic kidney disease -Have a baseline creatinine of 1.6 -Lab work showing stable kidney function  3. Hypertension -Blood pressures stable -Continue metoprolol 50 mg by mouth twice a day  4. Dyslipidemia -Continue Lipitor 80 mg by mouth daily  Code Status: Full Code Family Communication:  Disposition Plan: Anticipate Cath in am   Consultants:  Cardiology  Neurology  HPI/Subjective: Patient is being seen post cath, he is confused, disoriented, cannot provide history. Having difficulties following commands. This represents a significant change from my last evaluation   Objective: Filed Vitals:   06/05/14 1307  BP:   Pulse: 59  Temp:   Resp:  No intake or output data in the 24 hours ending 06/05/14 1630 Filed Weights   06/03/14 1618 06/03/14 2112  Weight: 97.523 kg (215 lb) 95.709 kg (211 lb)    Exam:   General:  Patient is confused, disoriented  Cardiovascular: Regular rate and rhythm normal S1-S2  Respiratory: Clear to auscultation bilaterally  Abdomen: Soft nontender nondistended  Musculoskeletal: No edema  Neurological: He is awake and alert, having difficulties getting words out, I do not no facial droop, pupils were equal round reactive to light with intact extraocular movement. Having difficulty following commands. Do not grip with his left hand. 2+ bilateral deep tendon reflexes  Data Reviewed: Basic Metabolic Panel:  Recent Labs Lab 06/03/14 1630 06/05/14 0451  NA 138 141  K 4.7 4.4  CL 101 103  CO2 27 28  GLUCOSE 93 100*  BUN 18 21  CREATININE 1.51* 1.60*  CALCIUM 10.1 9.5    Liver Function Tests: No results found for this basename: AST, ALT, ALKPHOS, BILITOT, PROT, ALBUMIN,  in the last 168 hours No results found for this basename: LIPASE, AMYLASE,  in the last 168 hours No results found for this basename: AMMONIA,  in the last 168 hours CBC:  Recent Labs Lab 06/03/14 1630 06/04/14 0935 06/05/14 0451  WBC 5.7 5.0 4.3  HGB 13.5 13.4 12.7*  HCT 39.1 38.8* 37.8*  MCV 81.8 83.4 84.9  PLT 168 122* 127*   Cardiac Enzymes:  Recent Labs Lab 06/03/14 2212 06/04/14 0512 06/04/14 0935  TROPONINI 0.44* 0.89* 0.65*   BNP (last 3 results) No results found for this basename: PROBNP,  in the last 8760 hours CBG:  Recent Labs Lab 06/05/14 1541  GLUCAP 84    No results found for this or any previous visit (from the past 240 hour(s)).   Studies: Dg Chest 2 View  06/03/2014   CLINICAL DATA:  Mid right chest pain.  EXAM: CHEST  2 VIEW  COMPARISON:  02/11/2010  FINDINGS: The heart size and mediastinal contours are within normal limits. Both lungs are clear. The visualized skeletal structures are unremarkable.  IMPRESSION: No active cardiopulmonary disease.   Electronically Signed   By: Rolm Baptise M.D.   On: 06/03/2014 19:17   Ct Head Wo Contrast  06/05/2014   CLINICAL DATA:  Confusion. A ectasia. Headache. Status post cardiac catheterization today.  EXAM: CT HEAD WITHOUT CONTRAST  TECHNIQUE: Contiguous axial images were obtained from the base of the skull through the vertex without intravenous contrast.  COMPARISON:  None.  FINDINGS: Ventricles are normal in configuration. There is mild ventricular and sulcal enlargement consistent with age related volume loss. There are no parenchymal masses or mass effect. Minor periventricular white matter hypoattenuation is noted consistent with chronic microvascular ischemic change.  There is no evidence of an infarct.  No extra-axial masses or abnormal fluid collections.  There is no intracranial hemorrhage.  Sinuses and  mastoid air cells are clear.  IMPRESSION: 1. No acute intracranial abnormalities. 2. Age related volume loss and minor chronic microvascular ischemic change. These results were called by telephone at the time of interpretation on 06/05/2014 at 4:08 pm to Dr. Doy Mince, who verbally acknowledged these results.   Electronically Signed   By: Lajean Manes M.D.   On: 06/05/2014 16:09    Scheduled Meds: . amLODipine  2.5 mg Oral Daily  . aspirin EC  81 mg Oral Daily  . atorvastatin  80 mg Oral q1800  . metoprolol  50 mg Oral BID  . [  START ON 06/06/2014] prasugrel  10 mg Oral Daily  . [START ON 06/06/2014] sodium chloride  3 mL Intravenous Q12H   Continuous Infusions: . sodium chloride      Principal Problem:   NSTEMI (non-ST elevated myocardial infarction) Active Problems:   HYPERTRIGLYCERIDEMIA, HX OF   HYPERLIPIDEMIA   HYPERTENSION   CORONARY ATHEROSCLEROSIS NATIVE CORONARY ARTERY   Chronic kidney disease (CKD), stage III (moderate)   Chest pain    Time spent: 35 min    Kelvin Cellar  [facility] Pager [phone]. If 7PM-7AM, please contact night-coverage at [email or web address], password [facility] 06/05/2014, 4:30 PM  LOS: 2 days

## 2014-06-06 ENCOUNTER — Inpatient Hospital Stay (HOSPITAL_COMMUNITY): Payer: Medicare HMO

## 2014-06-06 DIAGNOSIS — E781 Pure hyperglyceridemia: Secondary | ICD-10-CM

## 2014-06-06 LAB — BASIC METABOLIC PANEL
Anion gap: 16 — ABNORMAL HIGH (ref 5–15)
BUN: 19 mg/dL (ref 6–23)
CO2: 21 meq/L (ref 19–32)
Calcium: 8.5 mg/dL (ref 8.4–10.5)
Chloride: 101 mEq/L (ref 96–112)
Creatinine, Ser: 1.37 mg/dL — ABNORMAL HIGH (ref 0.50–1.35)
GFR calc Af Amer: 59 mL/min — ABNORMAL LOW (ref >90)
GFR calc non Af Amer: 51 mL/min — ABNORMAL LOW (ref >90)
GLUCOSE: 99 mg/dL (ref 70–99)
POTASSIUM: 3.8 meq/L (ref 3.7–5.3)
SODIUM: 138 meq/L (ref 137–147)

## 2014-06-06 LAB — CBC
HCT: 36.2 % — ABNORMAL LOW (ref 39.0–52.0)
HEMOGLOBIN: 12.6 g/dL — AB (ref 13.0–17.0)
MCH: 28.1 pg (ref 26.0–34.0)
MCHC: 34.8 g/dL (ref 30.0–36.0)
MCV: 80.8 fL (ref 78.0–100.0)
Platelets: 141 10*3/uL — ABNORMAL LOW (ref 150–400)
RBC: 4.48 MIL/uL (ref 4.22–5.81)
RDW: 13.6 % (ref 11.5–15.5)
WBC: 6.6 10*3/uL (ref 4.0–10.5)

## 2014-06-06 NOTE — Progress Notes (Signed)
1430 Came to walk with pt . Pt in middle of testing in room. We will follow up tomorrow. Graylon Good RN BSN 06/06/2014 2:33 PM

## 2014-06-06 NOTE — Progress Notes (Signed)
Patient alert and oriented almost to baseline. Patient understanding procedures from yesterday but doesn't remember all of episodes after cath. Patient awake and alert, wife at bedside. VSS remain stable see flowsheet. Patient NSR on monitor, appetite good. Plan for today is to increase activity and ambulate more. Will continue to monitor closely.

## 2014-06-06 NOTE — Progress Notes (Signed)
       Patient Name: [name] Date of Encounter: 06/06/2014    SUBJECTIVE: Still complaining of headache but much improved compared to last night. He tried several times during my interview and exam with him. His wife states that his overall neurological condition was improved but not back to baseline. The headache is now 2/10 in intensity compared with 9/10 in intensity last evening. He denies any episodes of chest discomfort. He denies dyspnea.  TELEMETRY:  Normal sinus rhythm Filed Vitals:   06/06/14 0747 06/06/14 0837 06/06/14 0926 06/06/14 1026  BP:   132/61   Pulse:   75 76  Temp: 98 F (36.7 C)     TempSrc: Oral     Resp: 16 15  25   Height:      Weight:      SpO2: 94% 93%  93%    Intake/Output Summary (Last 24 hours) at 06/06/14 1131 Last data filed at 06/06/14 0500  Gross per 24 hour  Intake 1085.6 ml  Output   2150 ml  Net -1064.4 ml   LABS: Basic Metabolic Panel:  Recent Labs  06/05/14 0451 06/06/14 0247  NA 141 138  K 4.4 3.8  CL 103 101  CO2 28 21  GLUCOSE 100* 99  BUN 21 19  CREATININE 1.60* 1.37*  CALCIUM 9.5 8.5   CBC:  Recent Labs  06/05/14 0451 06/06/14 0247  WBC 4.3 6.6  HGB 12.7* 12.6*  HCT 37.8* 36.2*  MCV 84.9 80.8  PLT 127* 141*   Cardiac Enzymes:  Recent Labs  06/03/14 2212 06/04/14 0512 06/04/14 0935  TROPONINI 0.44* 0.89* 0.65*    Hemoglobin A1C:  Recent Labs  06/04/14 0512  HGBA1C 5.8*   Fasting Lipid Panel:  Recent Labs  06/04/14 0512  CHOL 122  HDL 32*  LDLCALC UNABLE TO CALCULATE IF TRIGLYCERIDE OVER 400 mg/dL  TRIG 440*  CHOLHDL 3.8    Radiology/Studies:  CT head and CT of Angio neck and head were unremarkable when performed last evening  Physical Exam: Blood pressure 132/61, pulse 76, temperature 98 F (36.7 C), temperature source Oral, resp. rate 25, height 6\' 4"  (1.93 m), weight 211 lb (95.709 kg), SpO2 93.00%. Weight change:   Wt Readings from Last 3 Encounters:  06/03/14 211 lb (95.709  kg)  06/03/14 211 lb (95.709 kg)  07/28/13 219 lb (99.338 kg)   Cath site unremarkable S4 gallop but otherwise unremarkable Bland affect with lability and crying during the interview. Conversation was normal but according to his wife his responses were slower than normal no focal cranial nerve deficit or motor deficit is noted. He has not yet ambulatory.  ASSESSMENT:  1. Status post LAD stent without recurrence of angina or cardiac symptoms 2. Resolving neurological event/TIA versus CVA following PCI procedure with patient now being pulled by neurology 3. Diabetes mellitus with significant blood sugar elevation 4. Chronic kidney disease, stage III with improvement in function compared to prior  Plan:  1. PT and OT as recommended by neurology 2. Ambulate 3. Resume beta blocker therapy 4. Continue statins 5. Phase I cardiac rehabilitation  Signed, Sinclair Grooms 06/06/2014, 11:31 AM

## 2014-06-06 NOTE — Progress Notes (Signed)
Patient stated his Headache is about a 4/5 requesting Tylenol. "Please give it to me." Medicated with 2 more Tylenol.

## 2014-06-06 NOTE — Progress Notes (Signed)
EEG completed, results pending. 

## 2014-06-06 NOTE — Care Management Note (Addendum)
    Page 1 of 1   06/08/2014     11:25:27 AM CARE MANAGEMENT NOTE 06/08/2014  Patient:  AJEET, CASASOLA   Account Number:  [id number]  Date Initiated:  06/06/2014  Documentation initiated by:  Elissa Hefty  Subjective/Objective Assessment:   adm w mi     Action/Plan:   lives w wife, pcp dr Gwyndolyn Saxon hopper   Anticipated DC Date:     Anticipated DC Plan:        [place]  CM consult      Choice offered to / List presented to:             Status of service:   Medicare Important Message given?  YES (If response is "NO", the following Medicare IM given date fields will be blank) Date Medicare IM given:  06/06/2014 Medicare IM given by:  Elissa Hefty Date Additional Medicare IM given:  06/08/2014 Additional Medicare IM given by:  Court Gracia GRAVES-BIGELOW  Discharge Disposition:  HOME/SELF CARE  Per UR Regulation:  Reviewed for med. necessity/level of care/duration of stay  If discussed at [place] of Stay Meetings, dates discussed:    Comments:

## 2014-06-06 NOTE — Progress Notes (Signed)
Patient transferred via wheelchair to [phone], family and personal belongings with patient.

## 2014-06-06 NOTE — Evaluation (Signed)
Physical Therapy Evaluation Patient Details Name: [name] MRN: [id number] DOB: Dec 18, 1945 Today's Date: 06/06/2014   History of Present Illness  Pt is a very active 68 y/o male admitted with chest pain. While talking on a phone he started to have chest pain on the right side felt like pressure no shortness of breath or nausea this lasted for about 2 hours. He took some Tums and it eased off but continued to come back a bit. Pain spread to right arm. He took some aspirin. The pain continued to come and go and his wife brought him to the ED. Pt with NSTEMI and underwent L heart cath on 06/05/14. After the procedure pt with AMS and code stroke called.   Clinical Impression  Pt admitted with the above. Pt currently with functional limitations due to the deficits listed below (see PT Problem List). At the time of PT eval, pt states he feels "fine" other than a small headache. Strength and sensation equal R and L. Pt at a modified independent level for mobility and ambulation, however PT to continue to see while inpatient as pt is not quite as his baseline of function yet. Pt will benefit from skilled PT to increase their independence and safety with mobility to allow discharge to the venue listed below.       Follow Up Recommendations No PT follow up    Equipment Recommendations  None recommended by PT    Recommendations for Other Services       Precautions / Restrictions Precautions Precautions: Fall Restrictions Weight Bearing Restrictions: No      Mobility  Bed Mobility Overal bed mobility: Modified Independent             General bed mobility comments: Pt able to transition to EOB with no physical assist. Use of bed rails required for support/stability.   Transfers Overall transfer level: Modified independent Equipment used: None             General transfer comment: Pt was able to power-up to full standing with no physical assistance and no unsteadiness/LOB noted. Pt  reqired ~10 seconds before ready to initiate ambulation.   Ambulation/Gait Ambulation/Gait assistance: Min guard Ambulation Distance (Feet): 200 Feet Assistive device: None Gait Pattern/deviations: Step-through pattern;Decreased stride length;Trunk flexed Gait velocity: Decreased Gait velocity interpretation: Below normal speed for age/gender General Gait Details: Pt states he is walking slower than his usual pace. VC's for improved posture and general safety awareness. Pt reports no SOB or dizziness during gait training, yet O2 sats decreased to 77% at times, and up to >90% at other times. Feel the finger sensor was not positioned optimally on the pulse-ox and may have skewed results.   Stairs            Wheelchair Mobility    Modified Rankin (Stroke Patients Only)       Balance Overall balance assessment: No apparent balance deficits (not formally assessed)                                           Pertinent Vitals/Pain Pain Assessment: 0-10 Pain Score: 3  Pain Descriptors / Indicators: Headache Pain Intervention(s): Monitored during session    Home Living Family/patient expects to be discharged to:: Private residence Living Arrangements: Spouse/significant other Available Help at Discharge: Family;Available 24 hours/day Type of Home: House Home Access: Stairs to enter Entrance Stairs-Rails:  Left Entrance Stairs-Number of Steps: 3 Home Layout: One level Home Equipment: None      Prior Function Level of Independence: Independent         Comments: Driving, Estate agent Dominance   Dominant Hand: Right    Extremity/Trunk Assessment   Upper Extremity Assessment: Overall WFL for tasks assessed           Lower Extremity Assessment: Overall WFL for tasks assessed      Cervical / Trunk Assessment: Kyphotic (Forward head posture)  Communication   Communication: No difficulties  Cognition Arousal/Alertness:  Awake/alert Behavior During Therapy: WFL for tasks assessed/performed Overall Cognitive Status: Within Functional Limits for tasks assessed                      General Comments      Exercises        Assessment/Plan    PT Assessment Patient needs continued PT services  PT Diagnosis Difficulty walking;Generalized weakness   PT Problem List Decreased strength;Decreased range of motion;Decreased activity tolerance;Decreased balance;Decreased mobility;Decreased knowledge of use of DME;Decreased safety awareness;Decreased knowledge of precautions  PT Treatment Interventions DME instruction;Gait training;Stair training;Functional mobility training;Therapeutic activities;Therapeutic exercise;Neuromuscular re-education;Patient/family education   PT Goals (Current goals can be found in the Care Plan section) Acute Rehab PT Goals 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 PT Goal Formulation: With patient/family Time For Goal Achievement: 06/13/14 Potential to Achieve Goals: Good    Frequency Min 3X/week   Barriers to discharge        Co-evaluation               End of Session Equipment Utilized During Treatment: Gait belt Activity Tolerance: Patient tolerated treatment well Patient left: in chair;with call bell/phone within reach;with family/visitor present Nurse Communication: Mobility status         Time: 1142-1209 PT Time Calculation (min): 27 min   Charges:   PT Evaluation $Initial PT Evaluation Tier I: 1 Procedure PT Treatments $Gait Training: 8-22 mins $Therapeutic Activity: 8-22 mins   PT G Codes:          Jolyn Lent 06/06/2014, 12:12 PM  Jolyn Lent, PT, DPT Acute Rehabilitation Services Pager: [phone]

## 2014-06-06 NOTE — Progress Notes (Signed)
STROKE TEAM PROGRESS NOTE   HISTORY [name] is an 68 y.o. male who underwent a left heart catheterization and stenting. S/P procedure patient was noted to not be responding correctly to wife and code stroke was called. On arrival patient is alert but confused at where he is and having a hard time following simple verbal commands. Due to receiving large dose of Effient during cardiac stenting, he was not a tPA candidate. Patient was brought CT for CTA head and neck to evaluate for possible intervention.   Date last known well: Date: 06/05/2014  Time last known well: Time: 13:25  tPA Given: No: patient was loaded with effient 60 mg   SUBJECTIVE (INTERVAL HISTORY) His wife is at the bedside.  Overall he feels his condition is rapidly improving. He stated that he can not remember what happened from during the cardiac cath to last night. Wife said that he was confused and difficulty got words out in the afternoon and at night speech was better and kept asking where he was and what happened. Since this morning, he was much better and currently orientated and lucid.  OBJECTIVE  Recent Labs Lab 06/05/14 1541  GLUCAP 84    Recent Labs Lab 06/03/14 1630 06/05/14 0451 06/06/14 0247  NA 138 141 138  K 4.7 4.4 3.8  CL 101 103 101  CO2 27 28 21   GLUCOSE 93 100* 99  BUN 18 21 19   CREATININE 1.51* 1.60* 1.37*  CALCIUM 10.1 9.5 8.5   No results found for this basename: AST, ALT, ALKPHOS, BILITOT, PROT, ALBUMIN,  in the last 168 hours  Recent Labs Lab 06/03/14 1630 06/04/14 0935 06/05/14 0451 06/06/14 0247  WBC 5.7 5.0 4.3 6.6  HGB 13.5 13.4 12.7* 12.6*  HCT 39.1 38.8* 37.8* 36.2*  MCV 81.8 83.4 84.9 80.8  PLT 168 122* 127* 141*    Recent Labs Lab 06/03/14 2212 06/04/14 0512 06/04/14 0935  TROPONINI 0.44* 0.89* 0.65*    Recent Labs  06/05/14 0451  LABPROT 13.1  INR 0.99   No results found for this basename: COLORURINE, APPERANCEUR, LABSPEC, PHURINE, GLUCOSEU, HGBUR,  BILIRUBINUR, KETONESUR, PROTEINUR, UROBILINOGEN, NITRITE, LEUKOCYTESUR,  in the last 72 hours     Component Value Date/Time   CHOL 122 06/04/2014 0512   TRIG 440* 06/04/2014 0512   HDL 32* 06/04/2014 0512   CHOLHDL 3.8 06/04/2014 0512   VLDL UNABLE TO CALCULATE IF TRIGLYCERIDE OVER 400 mg/dL 06/04/2014 0512   LDLCALC UNABLE TO CALCULATE IF TRIGLYCERIDE OVER 400 mg/dL 06/04/2014 9675   Lab Results  Component Value Date   HGBA1C 5.8* 06/04/2014   No results found for this basename: labopia, cocainscrnur, labbenz, amphetmu, thcu, labbarb    No results found for this basename: ETH,  in the last 168 hours   Ct Head Wo Contrast 06/05/2014    No acute findings    06/05/2014    1. No acute intracranial abnormalities. 2. Age related volume loss and minor chronic microvascular ischemic change.   Ct Angio Head & Neck W/cm &/or Wo/cm 06/05/2014   1. No major intracranial arterial occlusion or proximal stenosis. 2. Mild to moderate left MCA superior division branch vessel irregularity and attenuation. 3. No cervical carotid or vertebral artery stenosis.  2D Echocardiogram  EF 55-60% with no source of embolus.   PHYSICAL EXAM Physical exam  Temp:  [97.8 F (36.6 C)-98.7 F (37.1 C)] 98.7 F (37.1 C) (09/01 1628) Pulse Rate:  [60-76] 65 (09/01 1628) Resp:  [11-25] 17 (  09/01 1628) BP: (110-167)/(38-144) 112/60 mmHg (09/01 1628) SpO2:  [90 %-97 %] 96 % (09/01 1628) Weight:  [215 lb (97.523 kg)] 215 lb (97.523 kg) (09/01 1628)  General - Well nourished, well developed, in no apparent distress, mild lethargic.  Ophthalmologic - Sharp disc margins OU.  Cardiovascular - Regular rate and rhythm with no murmur.  Mental Status -  Level of arousal and orientation to time, place, and person were intact. Language including expression, naming, repetition, comprehension was assessed and found intact. Attention span and concentration were normal.  Cranial Nerves II - XII - II - Visual field intact  OU. III, IV, VI - Extraocular movements intact. V - Facial sensation intact bilaterally. VII - Facial movement intact bilaterally. VIII - Hearing & vestibular intact bilaterally. X - Palate elevates symmetrically. XI - Chin turning & shoulder shrug intact bilaterally. XII - Tongue protrusion intact.  Motor Strength - The patient's strength was normal in all extremities and pronator drift was absent.  Bulk was normal and fasciculations were absent.   Motor Tone - Muscle tone was assessed at the neck and appendages and was normal.  Reflexes - The patient's reflexes were 1+ in all extremities and he had no pathological reflexes.  Sensory - Light touch, temperature/pinprick were assessed and were normal.    Coordination - The patient had normal movements in the hands and feet with no ataxia or dysmetria.  Tremor was absent.  Gait and Station - not tested since pt still feel weak  ASSESSMENT/PLAN [name] is a 68 y.o. male with hx of CAD who post cath presented with confusion, disorientation, aphasia, not able to follow commands. He did not receive IV t-PA due to prior heparin drip and high ACT level. CT no acute abnormality and CTA negative for LVO. Not able to do MRI due to newly placed stent. Stroke work up underway.  Acute confusion post cath - Stroke vs. TIA most likely, but seizure and TGA are in ddx.   aspirin 81 mg orally every day prior to admission, now on aspirin 81 mg and plavix orally every day  CT - no acute stroke, need to repeat CT head in 24-48 hours.  CTA head & neck - no lg vessel stenosis/occlusion, mild to mod L MCA superior division branch irregularity  No MRI/MRA Due to recent stent  Repeat CT head in am to confirm stroke  2D Echo EF 50-60%, No source of embolus  EEG to rule out seizure  HgbA1c 5.8 WNL  Heparin 5000 units sq tid for VTE prophylaxis  Cardiac thin liquids.   Up in chair  Hypertension   Home meds:  Norvasc, lopressor, resumed in  hospital  SBP 110-167 past 24h  Goal normotensive  Stable  Patient counseled to be compliant with his blood pressure medications  Hyperlipidemia  Home meds:  lipitor 20. Dose increased to lipitor 80 mg daily  LDL unable to calculate  High TG  LDL goal < 70  CAD  - Acute NSTEMI this admission, on heparin drip - post cath with percutaneous intervention to LAD with DES placement  - ASA and plavix and statin  Other Stroke Risk Factors Advanced age   Family hx stroke (mother)  Hospital day # 3  Rosalin Hawking, MD PhD Stroke Neurology 06/06/2014 4:57 PM    To contact Stroke Continuity provider, please refer to [email or web address] After hours, contact General Neurology

## 2014-06-06 NOTE — Progress Notes (Signed)
Pt now complaining of nausea and HA 8/10. Pt able to squeeze hands and wiggle toes however not lifting elbows and ankles off bed. Neuro notified. Repeat Head CT ordered. Will continue to monitor. Family at bedside.

## 2014-06-06 NOTE — Procedures (Addendum)
ELECTROENCEPHALOGRAM REPORT   Patient: [name]       Room #: 2H20 EEG No. ID: [id number] Age: 68 y.o.        Sex: male Referring Physician: Coralyn Pear Report Date:  06/06/2014        Interpreting Physician: Alexis Goodell D  History: [name] is an 68 y.o. male with onset of aphasia after cardiac catheterization and stent placement.    Medications:  Scheduled: . amLODipine  2.5 mg Oral Daily  . aspirin EC  81 mg Oral Daily  . atorvastatin  80 mg Oral q1800  . clopidogrel  75 mg Oral Daily  . heparin  5,000 Units Subcutaneous 3 times per day  . metoprolol  50 mg Oral BID  . sodium chloride  3 mL Intravenous Q12H    Conditions of Recording:  This is a 16 channel EEG carried out with the patient in the awake, drowsy and asleep states.  Description:  The waking background activity consists of a low voltage, symmetrical, fairly well organized, 8-9 Hz alpha activity, seen from the parieto-occipital and posterior temporal regions.  Low voltage fast activity, poorly organized, is seen anteriorly and is at times superimposed on more posterior regions.  A mixture of theta and alpha rhythms are seen from the central and temporal regions. The patient drowses with slowing to irregular, low voltage theta and beta activity.   The patient goes in to a light sleep with symmetrical sleep spindles, vertex central sharp transients and irregular slow activity.   Hyperventilation was not performed.  Intermittent photic stimulation was performed but failed to illicit any change in the tracing.     IMPRESSION: Normal electroencephalogram, awake, asleep and with activation procedures. There are no focal lateralizing or epileptiform features.   Alexis Goodell, MD [facility] [phone] 06/06/2014, 6:13 PM

## 2014-06-06 NOTE — Progress Notes (Signed)
OT Cancellation Note  Patient Details Name: [name] MRN: [id number] DOB: 24-Dec-1945   Cancelled Treatment:    Reason Eval/Treat Not Completed: OT screened, no needs identified, will sign off. Ot spoke with PT [name] and pt currently without acute needs.  [name] Pager: [phone]  06/06/2014, 3:27 PM

## 2014-06-06 NOTE — Progress Notes (Signed)
[facility] PROGRESS NOTE  [name] LOV:[id number] DOB: 1945/11/13 DOA: 06/03/2014 PCP: Unice Cobble, MD  HPI/Subjective: The patient is going back to his baseline, seen with his wife at bedside. No slurring of speech but has no recollection of what happened yesterday.  Interim Summary Patient is a pleasant 68 year old gentleman with established history of coronary artery disease, admitted to the medicine service on 06/03/2014 presenting with complaints of chest pain. He described chest pain as right-sided and pressure like. He was admitted to 3W for chest pain rule out. His troponin peaked at 0.89 and was started on IV heparin for NSTEMI. Cardiology was consulted. On 06/05/2014 he was taken to the Cath Lab, where cardiac catheterization revealed three-vessel coronary disease with patency of the stented segment of the mid circumflex. There was severe stenosis of the proximal LAD. Had percutaneous intervention to the proximal LAD lesion with a single drug-eluting stent. Immediately post procedure he appears to be doing well, answering questions appropriately in the cath lab. However, when he arrived to [facility] he had sudden onset confusion, having difficulty responding to questions or following commands. Patient was evaluated by Dr. Doy Mince of neurology,  Who recommended obtaining a CTA. Patient was transferred to [facility]. family members present at bedside were updated.  Assessment/Plan:  Suspected CVA -Patient undergoing cardiac catheterization today with percutaneous intervention to the LAD lesion -Postprocedure he developed sudden onset confusion, disorientation, trouble following commands.  -Neurology consulted, CTA does not show any evidence of stroke. -Await recommendation from stroke team. Neurology recommended Plavix, he is on ASA as well..                                                                                                                                                                                       Non-ST segment elevation myocardial infarction -Has history of established coronary artery disease with non-ST segment elevation myocardial infarct in 2001, status post percutaneous intervention to left circumflex with a DES.  -Presented with complaints of chest pain located in right side. -Troponins peaking at 0.89 -Status post cardiac catheterization on 06/05/2014 which showed severe stenosis of proximal LAD, status post percutaneous intervention with single drug-eluting stent -Cardiology recommending aspirin and effient for 12 months, Effient switched to Plavix.  Stage III chronic kidney disease -Have a baseline creatinine of 1.6 -Lab work showing stable kidney function  Hypertension -Blood pressures stable -Continue metoprolol 50 mg by mouth twice a day  Dyslipidemia -Continue Lipitor 80 mg by mouth daily  Code Status: Full Code Family Communication:  Disposition Plan: Transfer back to telemetry today.   Consultants:  Cardiology  Neurology    Objective: Filed Vitals:  06/06/14 0926  BP: 132/61  Pulse: 75  Temp:   Resp:     Intake/Output Summary (Last 24 hours) at 06/06/14 0950 Last data filed at 06/06/14 0500  Gross per 24 hour  Intake 1085.6 ml  Output   2150 ml  Net -1064.4 ml   Filed Weights   06/03/14 1618 06/03/14 2112  Weight: 97.523 kg (215 lb) 95.709 kg (211 lb)    Exam:   General:  Patient is confused, disoriented  Cardiovascular: Regular rate and rhythm normal S1-S2  Respiratory: Clear to auscultation bilaterally  Abdomen: Soft nontender nondistended  Musculoskeletal: No edema  Neurological: He is awake and alert, having difficulties getting words out, I do not no facial droop, pupils were equal round reactive to light with intact extraocular movement. Having difficulty following commands. Do not grip with his left hand. 2+ bilateral deep tendon reflexes  Data Reviewed: Basic Metabolic Panel:  Recent  Labs Lab 06/03/14 1630 06/05/14 0451 06/06/14 0247  NA 138 141 138  K 4.7 4.4 3.8  CL 101 103 101  CO2 27 28 21   GLUCOSE 93 100* 99  BUN 18 21 19   CREATININE 1.51* 1.60* 1.37*  CALCIUM 10.1 9.5 8.5   Liver Function Tests: No results found for this basename: AST, ALT, ALKPHOS, BILITOT, PROT, ALBUMIN,  in the last 168 hours No results found for this basename: LIPASE, AMYLASE,  in the last 168 hours No results found for this basename: AMMONIA,  in the last 168 hours CBC:  Recent Labs Lab 06/03/14 1630 06/04/14 0935 06/05/14 0451 06/06/14 0247  WBC 5.7 5.0 4.3 6.6  HGB 13.5 13.4 12.7* 12.6*  HCT 39.1 38.8* 37.8* 36.2*  MCV 81.8 83.4 84.9 80.8  PLT 168 122* 127* 141*   Cardiac Enzymes:  Recent Labs Lab 06/03/14 2212 06/04/14 0512 06/04/14 0935  TROPONINI 0.44* 0.89* 0.65*   BNP (last 3 results) No results found for this basename: PROBNP,  in the last 8760 hours CBG:  Recent Labs Lab 06/05/14 1541  GLUCAP 84    No results found for this or any previous visit (from the past 240 hour(s)).   Studies: Ct Angio Head W/cm &/or Wo Cm  06/05/2014   ADDENDUM REPORT: 06/05/2014 17:59  ADDENDUM: Examination read by Dr. Jeralyn Ruths. Results discussed with Dr. Doy Mince by myself 06/05/2014 5:42 p.m.   Electronically Signed   By: Chauncey Cruel M.D.   On: 06/05/2014 17:59   06/05/2014   CLINICAL DATA:  Aphasia.  EXAM: CT ANGIOGRAPHY HEAD AND NECK  TECHNIQUE: Multidetector CT imaging of the head and neck was performed using the standard protocol during bolus administration of intravenous contrast. Multiplanar CT image reconstructions and MIPs were obtained to evaluate the vascular anatomy. Carotid stenosis measurements (when applicable) are obtained utilizing NASCET criteria, using the distal internal carotid diameter as the denominator.  CONTRAST:  20mL OMNIPAQUE IOHEXOL 350 MG/ML SOLN  COMPARISON:  Head CT 06/05/2014  FINDINGS: CTA HEAD FINDINGS  Postcontrast head CT is without evidence  of abnormal enhancement. No acute large territory infarct, intracranial hemorrhage, mass, midline shift, or extra-axial fluid collection is identified. There is mild generalized cerebral atrophy. Orbits are unremarkable. Mastoid air cells are clear. There is minimal right maxillary sinus mucosal thickening.  The intracranial vertebral arteries are patent to the basilar bilaterally. PICA origins are patent. The right AICA is dominant. SCA origins are patent. Basilar artery is patent without stenosis. PCAs are unremarkable. There is a small, patent posterior communicating artery on the right.  Internal carotid arteries are patent from skullbase to carotid termini. There is mild bilateral carotid siphon irregularity and calcification without evidence of significant stenosis. M1 segments are patent without stenosis. There is mild to moderate left MCA superior division branch vessel irregularity and attenuation in the region of the frontal operculum. The right A1 segment appears hypoplastic. Anterior communicating artery is patent. ACAs are otherwise unremarkable.  Review of the MIP images confirms the above findings.  CTA NECK FINDINGS  Three vessel aortic arch. The proximal right common carotid artery is mildly tortuous and medialized. The common carotid arteries are otherwise unremarkable without stenosis. The right cervical internal carotid artery is mildly small in caliber diffusely compared to the left without focal stenosis, and this may be developmental given the hypoplastic right A1 segment. Vertebral arteries are patent and codominant without stenosis.  There is mild biapical scarring in the lungs. A 5 mm stone is noted anteriorly in the left submandibular gland without ductal dilatation or inflammatory change to suggest acute sialoadenitis. Moderate multilevel cervical spondylosis is noted.  Review of the MIP images confirms the above findings.  IMPRESSION: 1. No major intracranial arterial occlusion or  proximal stenosis. 2. Mild to moderate left MCA superior division branch vessel irregularity and attenuation. 3. No cervical carotid or vertebral artery stenosis. The neurology service has been paged to communicate these findings.  Electronically Signed: By: Logan Bores On: 06/05/2014 17:36   Ct Head Wo Contrast  06/05/2014   CLINICAL DATA:  Increasingly altered mental status  EXAM: CT HEAD WITHOUT CONTRAST  TECHNIQUE: Contiguous axial images were obtained from the base of the skull through the vertex without intravenous contrast.  COMPARISON:  06/05/2014  FINDINGS: No hemorrhage or extra-axial fluid. There is age-related cortical atrophy. No evidence of infarct or mass. No hydrocephalus. Calvarium is intact.  IMPRESSION: No acute findings   Electronically Signed   By: Skipper Cliche M.D.   On: 06/05/2014 22:02   Ct Head Wo Contrast  06/05/2014   CLINICAL DATA:  Confusion. A ectasia. Headache. Status post cardiac catheterization today.  EXAM: CT HEAD WITHOUT CONTRAST  TECHNIQUE: Contiguous axial images were obtained from the base of the skull through the vertex without intravenous contrast.  COMPARISON:  None.  FINDINGS: Ventricles are normal in configuration. There is mild ventricular and sulcal enlargement consistent with age related volume loss. There are no parenchymal masses or mass effect. Minor periventricular white matter hypoattenuation is noted consistent with chronic microvascular ischemic change.  There is no evidence of an infarct.  No extra-axial masses or abnormal fluid collections.  There is no intracranial hemorrhage.  Sinuses and mastoid air cells are clear.  IMPRESSION: 1. No acute intracranial abnormalities. 2. Age related volume loss and minor chronic microvascular ischemic change. These results were called by telephone at the time of interpretation on 06/05/2014 at 4:08 pm to Dr. Doy Mince, who verbally acknowledged these results.   Electronically Signed   By: Lajean Manes M.D.   On:  06/05/2014 16:09   Ct Angio Neck W/cm &/or Wo/cm  06/05/2014   ADDENDUM REPORT: 06/05/2014 17:59  ADDENDUM: Examination read by Dr. Jeralyn Ruths. Results discussed with Dr. Doy Mince by myself 06/05/2014 5:42 p.m.   Electronically Signed   By: Chauncey Cruel M.D.   On: 06/05/2014 17:59   06/05/2014   CLINICAL DATA:  Aphasia.  EXAM: CT ANGIOGRAPHY HEAD AND NECK  TECHNIQUE: Multidetector CT imaging of the head and neck was performed using the standard protocol during bolus administration of intravenous  contrast. Multiplanar CT image reconstructions and MIPs were obtained to evaluate the vascular anatomy. Carotid stenosis measurements (when applicable) are obtained utilizing NASCET criteria, using the distal internal carotid diameter as the denominator.  CONTRAST:  39mL OMNIPAQUE IOHEXOL 350 MG/ML SOLN  COMPARISON:  Head CT 06/05/2014  FINDINGS: CTA HEAD FINDINGS  Postcontrast head CT is without evidence of abnormal enhancement. No acute large territory infarct, intracranial hemorrhage, mass, midline shift, or extra-axial fluid collection is identified. There is mild generalized cerebral atrophy. Orbits are unremarkable. Mastoid air cells are clear. There is minimal right maxillary sinus mucosal thickening.  The intracranial vertebral arteries are patent to the basilar bilaterally. PICA origins are patent. The right AICA is dominant. SCA origins are patent. Basilar artery is patent without stenosis. PCAs are unremarkable. There is a small, patent posterior communicating artery on the right.  Internal carotid arteries are patent from skullbase to carotid termini. There is mild bilateral carotid siphon irregularity and calcification without evidence of significant stenosis. M1 segments are patent without stenosis. There is mild to moderate left MCA superior division branch vessel irregularity and attenuation in the region of the frontal operculum. The right A1 segment appears hypoplastic. Anterior communicating artery is  patent. ACAs are otherwise unremarkable.  Review of the MIP images confirms the above findings.  CTA NECK FINDINGS  Three vessel aortic arch. The proximal right common carotid artery is mildly tortuous and medialized. The common carotid arteries are otherwise unremarkable without stenosis. The right cervical internal carotid artery is mildly small in caliber diffusely compared to the left without focal stenosis, and this may be developmental given the hypoplastic right A1 segment. Vertebral arteries are patent and codominant without stenosis.  There is mild biapical scarring in the lungs. A 5 mm stone is noted anteriorly in the left submandibular gland without ductal dilatation or inflammatory change to suggest acute sialoadenitis. Moderate multilevel cervical spondylosis is noted.  Review of the MIP images confirms the above findings.  IMPRESSION: 1. No major intracranial arterial occlusion or proximal stenosis. 2. Mild to moderate left MCA superior division branch vessel irregularity and attenuation. 3. No cervical carotid or vertebral artery stenosis. The neurology service has been paged to communicate these findings.  Electronically Signed: By: Logan Bores On: 06/05/2014 17:36    Scheduled Meds: .  stroke: mapping our early stages of recovery book   Does not apply Once  . amLODipine  2.5 mg Oral Daily  . aspirin EC  81 mg Oral Daily  . atorvastatin  80 mg Oral q1800  . clopidogrel  75 mg Oral Daily  . heparin  5,000 Units Subcutaneous 3 times per day  . metoprolol  50 mg Oral BID  . sodium chloride  3 mL Intravenous Q12H   Continuous Infusions:    Principal Problem:   NSTEMI (non-ST elevated myocardial infarction) Active Problems:   HYPERTRIGLYCERIDEMIA, HX OF   HYPERLIPIDEMIA   HYPERTENSION   CORONARY ATHEROSCLEROSIS NATIVE CORONARY ARTERY   Chronic kidney disease (CKD), stage III (moderate)   Chest pain   CVA (cerebral infarction)    Time spent: 35 min    [facility] Pager [phone]. If 7PM-7AM, please contact night-coverage at [email or web address], password [facility] 06/06/2014, 9:50 AM  LOS: 3 days

## 2014-06-06 NOTE — Progress Notes (Signed)
MD order to hold metoprolol tonight as to not drop BP. Pt s/p stroke. Will continue to monitor.  Perkins, [name]

## 2014-06-07 ENCOUNTER — Inpatient Hospital Stay (HOSPITAL_COMMUNITY): Payer: Medicare HMO

## 2014-06-07 ENCOUNTER — Encounter (HOSPITAL_COMMUNITY): Payer: Self-pay

## 2014-06-07 LAB — CBC
HEMATOCRIT: 36.8 % — AB (ref 39.0–52.0)
HEMOGLOBIN: 12.4 g/dL — AB (ref 13.0–17.0)
MCH: 28.4 pg (ref 26.0–34.0)
MCHC: 33.7 g/dL (ref 30.0–36.0)
MCV: 84.2 fL (ref 78.0–100.0)
Platelets: 122 10*3/uL — ABNORMAL LOW (ref 150–400)
RBC: 4.37 MIL/uL (ref 4.22–5.81)
RDW: 14 % (ref 11.5–15.5)
WBC: 4.5 10*3/uL (ref 4.0–10.5)

## 2014-06-07 LAB — BASIC METABOLIC PANEL
ANION GAP: 10 (ref 5–15)
BUN: 21 mg/dL (ref 6–23)
CHLORIDE: 105 meq/L (ref 96–112)
CO2: 28 meq/L (ref 19–32)
Calcium: 9.1 mg/dL (ref 8.4–10.5)
Creatinine, Ser: 1.58 mg/dL — ABNORMAL HIGH (ref 0.50–1.35)
GFR calc Af Amer: 49 mL/min — ABNORMAL LOW (ref >90)
GFR calc non Af Amer: 43 mL/min — ABNORMAL LOW (ref >90)
Glucose, Bld: 98 mg/dL (ref 70–99)
Potassium: 4.4 mEq/L (ref 3.7–5.3)
Sodium: 143 mEq/L (ref 137–147)

## 2014-06-07 NOTE — Progress Notes (Signed)
[facility] PROGRESS NOTE  Assessment/Plan: Suspected CVA  - Postprocedure he developed sudden onset confusion, disorientation, trouble following commands.  - Neurology consulted, CTA does not show any evidence of stroke.  - repeat CTA on 9.2.2015: Atrophy and microvascular ischemic disease without acute intracranial process - EEG negative for seizures. - Heparin 5000 units sq tid for VTE prophylaxis  Non-ST segment elevation myocardial infarction  - Has history of established coronary artery disease with non-ST segment elevation myocardial infarct in 2001, status post percutaneous intervention to left circumflex with a DES.  - Troponins peaking at 0.89  - Status post cardiac catheterization on 06/05/2014 which showed severe stenosis of proximal LAD, status post percutaneous intervention with single drug-eluting stent  - Cardiology recommending aspirin and effient for 12 months, Effient switched to Plavix.   Stage III chronic kidney disease  -Have a baseline creatinine of 1.6  - repeat b-met in am.  Hypertension  -Blood pressures stable  -Continue metoprolol 50 mg by mouth twice a day   Dyslipidemia  -Continue Lipitor 80 mg by mouth daily     Code Status: full Family Communication: none  Disposition Plan: inpaitent   Consultants:  Neurology  cardiology  Procedures: EEG: Normal electroencephalogram, awake, asleep and with activation procedures.    Antibiotics:  none  HPI/Subjective: No complains  Objective: Filed Vitals:   06/07/14 0027 06/07/14 0431 06/07/14 0740 06/07/14 0925  BP: 109/51 111/60 123/64 120/60  Pulse: 79 60 58 67  Temp: 98.4 F (36.9 C) 98 F (36.7 C) 98.5 F (36.9 C) 98 F (36.7 C)  TempSrc: Oral Oral Oral Oral  Resp: _0 Height:      Weight:  96.752 kg (213 lb 4.8 oz)    SpO2: 96% 96% 96% 98%    Intake/Output Summary (Last 24 hours) at 06/07/14 1326 Last data filed at 06/07/14 1035  Gross per 24 hour  Intake     150 ml  Output      0 ml  Net    150 ml   Filed Weights   06/03/14 2112 06/06/14 1628 06/07/14 0431  Weight: 95.709 kg (211 lb) 97.523 kg (215 lb) 96.752 kg (213 lb 4.8 oz)    Exam:  General: Alert, awake, oriented x3, in no acute distress.  HEENT: No bruits, no goiter.  Heart: Regular rate and rhythm. Lungs: Good air movement, clear Abdomen: Soft, nontender, nondistended, positive bowel sounds.    Data Reviewed: Basic Metabolic Panel:  Recent Labs Lab 06/03/14 1630 06/05/14 0451 06/06/14 0247 06/07/14 0403  NA 138 141 138 143  K 4.7 4.4 3.8 4.4  CL 101 103 101 105  CO2 _1 GLUCOSE 93 100* 99 98  BUN _2 CREATININE 1.51* 1.60* 1.37* 1.58*  CALCIUM 10.1 9.5 8.5 9.1   Liver Function Tests: No results found for this basename: AST, ALT, ALKPHOS, BILITOT, PROT, ALBUMIN,  in the last 168 hours No results found for this basename: LIPASE, AMYLASE,  in the last 168 hours No results found for this basename: AMMONIA,  in the last 168 hours CBC:  Recent Labs Lab 06/03/14 1630 06/04/14 0935 06/05/14 0451 06/06/14 0247 06/07/14 0403  WBC 5.7 5.0 4.3 6.6 4.5  HGB 13.5 13.4 12.7* 12.6* 12.4*  HCT 39.1 38.8* 37.8* 36.2* 36.8*  MCV 81.8 83.4 84.9 80.8 84.2  PLT 168 122* 127* 141* 122*   Cardiac Enzymes:  Recent Labs Lab 06/03/14 2212 06/04/14 0512 06/04/14 0935  TROPONINI 0.44* 0.89* 0.65*   BNP (last 3 results) No results found for this basename: PROBNP,  in the last 8760 hours CBG:  Recent Labs Lab 06/05/14 1541  GLUCAP 84    No results found for this or any previous visit (from the past 240 hour(s)).   Studies: Ct Angio Head W/cm &/or Wo Cm  06/05/2014   ADDENDUM REPORT: 06/05/2014 17:59  ADDENDUM: Examination read by Dr. Jeralyn Ruths. Results discussed with Dr. Doy Mince by myself 06/05/2014 5:42 p.m.   Electronically Signed   By: Chauncey Cruel M.D.   On: 06/05/2014 17:59   06/05/2014   CLINICAL DATA:  Aphasia.  EXAM: CT ANGIOGRAPHY HEAD AND  NECK  TECHNIQUE: Multidetector CT imaging of the head and neck was performed using the standard protocol during bolus administration of intravenous contrast. Multiplanar CT image reconstructions and MIPs were obtained to evaluate the vascular anatomy. Carotid stenosis measurements (when applicable) are obtained utilizing NASCET criteria, using the distal internal carotid diameter as the denominator.  CONTRAST:  20m OMNIPAQUE IOHEXOL 350 MG/ML SOLN  COMPARISON:  Head CT 06/05/2014  FINDINGS: CTA HEAD FINDINGS  Postcontrast head CT is without evidence of abnormal enhancement. No acute large territory infarct, intracranial hemorrhage, mass, midline shift, or extra-axial fluid collection is identified. There is mild generalized cerebral atrophy. Orbits are unremarkable. Mastoid air cells are clear. There is minimal right maxillary sinus mucosal thickening.  The intracranial vertebral arteries are patent to the basilar bilaterally. PICA origins are patent. The right AICA is dominant. SCA origins are patent. Basilar artery is patent without stenosis. PCAs are unremarkable. There is a small, patent posterior communicating artery on the right.  Internal carotid arteries are patent from skullbase to carotid termini. There is mild bilateral carotid siphon irregularity and calcification without evidence of significant stenosis. M1 segments are patent without stenosis. There is mild to moderate left MCA superior division branch vessel irregularity and attenuation in the region of the frontal operculum. The right A1 segment appears hypoplastic. Anterior communicating artery is patent. ACAs are otherwise unremarkable.  Review of the MIP images confirms the above findings.  CTA NECK FINDINGS  Three vessel aortic arch. The proximal right common carotid artery is mildly tortuous and medialized. The common carotid arteries are otherwise unremarkable without stenosis. The right cervical internal carotid artery is mildly small in  caliber diffusely compared to the left without focal stenosis, and this may be developmental given the hypoplastic right A1 segment. Vertebral arteries are patent and codominant without stenosis.  There is mild biapical scarring in the lungs. A 5 mm stone is noted anteriorly in the left submandibular gland without ductal dilatation or inflammatory change to suggest acute sialoadenitis. Moderate multilevel cervical spondylosis is noted.  Review of the MIP images confirms the above findings.  IMPRESSION: 1. No major intracranial arterial occlusion or proximal stenosis. 2. Mild to moderate left MCA superior division branch vessel irregularity and attenuation. 3. No cervical carotid or vertebral artery stenosis. The neurology service has been paged to communicate these findings.  Electronically Signed: By: ALogan BoresOn: 06/05/2014 17:36   Ct Head Wo Contrast  06/07/2014   CLINICAL DATA:  Post stent placement yesterday, now with memory loss. Difficulty with speech.  EXAM: CT HEAD WITHOUT CONTRAST  TECHNIQUE: Contiguous axial images were obtained from the base of the skull through the vertex without intravenous contrast.  COMPARISON:  06/05/2014 (multiple examinations)  FINDINGS: Unchanged findings of mild atrophy with diffuse sulcal prominence and prominence of the bifrontal extra-axial  spaces. Scattered periventricular hypodensities compatible with microvascular ischemic disease. No CT evidence of acute large territory infarct. No intraparenchymal extra-axial mass or hemorrhage. Unchanged size and configuration of the ventricles and basilar cisterns. No midline shift. There is mild leftward deviation of the nasal bones. Paranasal sinuses and mastoid air cells are normally aerated. No air-fluid levels. Regional soft tissues appear normal. No displaced calvarial fracture.  IMPRESSION: Atrophy and microvascular ischemic disease without acute intracranial process.   Electronically Signed   By: Sandi Mariscal M.D.   On:  06/07/2014 07:40   Ct Head Wo Contrast  06/05/2014   CLINICAL DATA:  Increasingly altered mental status  EXAM: CT HEAD WITHOUT CONTRAST  TECHNIQUE: Contiguous axial images were obtained from the base of the skull through the vertex without intravenous contrast.  COMPARISON:  06/05/2014  FINDINGS: No hemorrhage or extra-axial fluid. There is age-related cortical atrophy. No evidence of infarct or mass. No hydrocephalus. Calvarium is intact.  IMPRESSION: No acute findings   Electronically Signed   By: Skipper Cliche M.D.   On: 06/05/2014 22:02   Ct Head Wo Contrast  06/05/2014   CLINICAL DATA:  Confusion. A ectasia. Headache. Status post cardiac catheterization today.  EXAM: CT HEAD WITHOUT CONTRAST  TECHNIQUE: Contiguous axial images were obtained from the base of the skull through the vertex without intravenous contrast.  COMPARISON:  None.  FINDINGS: Ventricles are normal in configuration. There is mild ventricular and sulcal enlargement consistent with age related volume loss. There are no parenchymal masses or mass effect. Minor periventricular white matter hypoattenuation is noted consistent with chronic microvascular ischemic change.  There is no evidence of an infarct.  No extra-axial masses or abnormal fluid collections.  There is no intracranial hemorrhage.  Sinuses and mastoid air cells are clear.  IMPRESSION: 1. No acute intracranial abnormalities. 2. Age related volume loss and minor chronic microvascular ischemic change. These results were called by telephone at the time of interpretation on 06/05/2014 at 4:08 pm to Dr. Doy Mince, who verbally acknowledged these results.   Electronically Signed   By: Lajean Manes M.D.   On: 06/05/2014 16:09   Ct Angio Neck W/cm &/or Wo/cm  06/05/2014   ADDENDUM REPORT: 06/05/2014 17:59  ADDENDUM: Examination read by Dr. Jeralyn Ruths. Results discussed with Dr. Doy Mince by myself 06/05/2014 5:42 p.m.   Electronically Signed   By: Chauncey Cruel M.D.   On: 06/05/2014 17:59    06/05/2014   CLINICAL DATA:  Aphasia.  EXAM: CT ANGIOGRAPHY HEAD AND NECK  TECHNIQUE: Multidetector CT imaging of the head and neck was performed using the standard protocol during bolus administration of intravenous contrast. Multiplanar CT image reconstructions and MIPs were obtained to evaluate the vascular anatomy. Carotid stenosis measurements (when applicable) are obtained utilizing NASCET criteria, using the distal internal carotid diameter as the denominator.  CONTRAST:  25m OMNIPAQUE IOHEXOL 350 MG/ML SOLN  COMPARISON:  Head CT 06/05/2014  FINDINGS: CTA HEAD FINDINGS  Postcontrast head CT is without evidence of abnormal enhancement. No acute large territory infarct, intracranial hemorrhage, mass, midline shift, or extra-axial fluid collection is identified. There is mild generalized cerebral atrophy. Orbits are unremarkable. Mastoid air cells are clear. There is minimal right maxillary sinus mucosal thickening.  The intracranial vertebral arteries are patent to the basilar bilaterally. PICA origins are patent. The right AICA is dominant. SCA origins are patent. Basilar artery is patent without stenosis. PCAs are unremarkable. There is a small, patent posterior communicating artery on the right.  Internal carotid arteries  are patent from skullbase to carotid termini. There is mild bilateral carotid siphon irregularity and calcification without evidence of significant stenosis. M1 segments are patent without stenosis. There is mild to moderate left MCA superior division branch vessel irregularity and attenuation in the region of the frontal operculum. The right A1 segment appears hypoplastic. Anterior communicating artery is patent. ACAs are otherwise unremarkable.  Review of the MIP images confirms the above findings.  CTA NECK FINDINGS  Three vessel aortic arch. The proximal right common carotid artery is mildly tortuous and medialized. The common carotid arteries are otherwise unremarkable without  stenosis. The right cervical internal carotid artery is mildly small in caliber diffusely compared to the left without focal stenosis, and this may be developmental given the hypoplastic right A1 segment. Vertebral arteries are patent and codominant without stenosis.  There is mild biapical scarring in the lungs. A 5 mm stone is noted anteriorly in the left submandibular gland without ductal dilatation or inflammatory change to suggest acute sialoadenitis. Moderate multilevel cervical spondylosis is noted.  Review of the MIP images confirms the above findings.  IMPRESSION: 1. No major intracranial arterial occlusion or proximal stenosis. 2. Mild to moderate left MCA superior division branch vessel irregularity and attenuation. 3. No cervical carotid or vertebral artery stenosis. The neurology service has been paged to communicate these findings.  Electronically Signed: By: Logan Bores On: 06/05/2014 17:36    Scheduled Meds: . amLODipine  2.5 mg Oral Daily  . aspirin EC  81 mg Oral Daily  . atorvastatin  80 mg Oral q1800  . clopidogrel  75 mg Oral Daily  . heparin  5,000 Units Subcutaneous 3 times per day  . metoprolol  50 mg Oral BID  . sodium chloride  3 mL Intravenous Q12H   Continuous Infusions:    Charlynne Cousins  [facility] Pager [phone]. If 8PM-8AM, please contact night-coverage at [email or web address], password [facility] 06/07/2014, 1:26 PM  LOS: 4 days      **Disclaimer: This note may have been dictated with voice recognition software. Similar sounding words can inadvertently be transcribed and this note may contain transcription errors which may not have been corrected upon publication of note.**

## 2014-06-07 NOTE — Evaluation (Signed)
Speech Language Pathology Evaluation Patient Details Name: [name]: [id number] DOB: 10/01/1946 Today's Date: 06/07/2014 Time: 4403-4742 SLP Time Calculation (min): 16 min  Problem List:  Patient Active Problem List   Diagnosis Date Noted  . Chest pain 06/05/2014  . CVA (cerebral infarction) 06/05/2014  . NSTEMI (non-ST elevated myocardial infarction) 06/04/2014  . Chronic kidney disease (CKD), stage III (moderate) 06/14/2010  . CORONARY ATHEROSCLEROSIS NATIVE CORONARY ARTERY 03/01/2010  . PERNICIOUS ANEMIA 04/24/2009  . TREMOR, ESSENTIAL, RIGHT HAND 04/24/2009  . PROSTATE CANCER, HX OF 04/24/2009  . Melanoma 04/24/2009  . HYPERLIPIDEMIA 10/28/2007  . HYPERTENSION 10/13/2007  . HYPERTRIGLYCERIDEMIA, HX OF 01/20/2007   Past Medical History:  Past Medical History  Diagnosis Date  . Hypertension     did not tolerate Lisinopril event at low dose  . Hyperlipidemia   . Anemia, mild   . Vitamin B12 deficiency   . Prostate cancer   . CAD (coronary artery disease)     a. NSTEMI 5/11. b.  LHC: 60% pLAD, 70% mLAD, 95% mRCA w/ TIMI 2 flow distally, EF 60%.  Patient had PROMUS DES to [facility]. c. cath 06/05/14: LM mild irregs, pLAD 70%--> s/p PCI/DES (Promus DES), mLAD 50%, D1 75% ostial, LCx w/o significant obs, mLCx stent w/o restenosis, pRCA  30%, mRCA 50-60%  . CKD (chronic kidney disease), stage III   . Diastolic dysfunction     a. Echo 6/11: mild LVH, EF 55-60%, GR1DD, Trivial MR, mild LAE. b. echo 06/04/14: EF 55-60%, no WMA, GR1DD, Ao valve mildly calcifed, mild MR, mild LAE  . Melanoma     a. 1987. b. 2012  . Pulmonary embolism    Past Surgical History:  Past Surgical History  Procedure Laterality Date  . Cholecystectomy    . Prostatectomy      Dr. Karsten Ro  . Knee arthroscopy      right  . Tonsillectomy    . Mrca stent  02/2010  . Melanoma excision  2011     L chest  . Melanoma excision  1991    back   HPI:  [name] is an 68 y.o. male with onset of aphasia  after cardiac catheterization and stent placement.    Assessment / Plan / Recommendation Clinical Impression  Cognitive-linguistic skilled currently returning to baseline with only occassional word finding difficulties noted by family (not observed by this SLP). Patient does endorse some mild short term memory impairements at baseline which based on todays testing appearing to be originating from a mild divided attention deficit. SLP provided patient with strategies which may help to facilitate improved short term recall of information, most of which patient already utilizing independently. No f/u SLP needs indicated at this time.     SLP Assessment  Patient does not need any further Speech Lanaguage Pathology Services    Follow Up Recommendations  None       Pertinent Vitals/Pain Pain Assessment: No/denies pain   SLP Goals  SLP Goals Progress/Goals/Alternative treatment plan discussed with pt/caregiver and they: Agree  SLP Evaluation Prior Functioning  Cognitive/Linguistic Baseline: Within functional limits   Cognition  Overall Cognitive Status: Within Functional Limits for tasks assessed (see clinical impression statement) Orientation Level: Oriented X4    Comprehension  Auditory Comprehension Overall Auditory Comprehension: Appears within functional limits for tasks assessed Visual Recognition/Discrimination Discrimination: Within Function Limits Reading Comprehension Reading Status: Within funtional limits    Expression Expression Primary Mode of Expression: Verbal Verbal Expression Overall Verbal Expression: Appears within  functional limits for tasks assessed Written Expression Dominant Hand: Right   Oral / Motor Oral Motor/Sensory Function Overall Oral Motor/Sensory Function: Appears within functional limits for tasks assessed Motor Speech Overall Motor Speech: Appears within functional limits for tasks assessed   GO    Brian Rainwater MA,  CCC-SLP [phone]  [name] [name] 06/07/2014, 11:22 AM

## 2014-06-07 NOTE — Progress Notes (Signed)
CARDIAC REHAB PHASE I   PRE:  Rate/Rhythm: 67 SR    BP: sitting 120/60    SaO2:   MODE:  Ambulation: 500 ft   POST:  Rate/Rhythm: 89 SR    BP: sitting 151/75     SaO2: 99 RA  Tolerated well, steady. Denied problems with appropriate pace. Pt sts his memory is slightly foggy but seems to be improving quickly. Ed completed with pt and wife. Reinforced stent, NTG, diet and ex. Voiced understanding but pt is not interested in CRPII. [place] to be active on his [place]. [id number]   [name] [place] CES, ACSM 06/07/2014 10:15 AM

## 2014-06-07 NOTE — Progress Notes (Signed)
STROKE TEAM PROGRESS NOTE   HISTORY [name] is an 68 y.o. male who underwent a left heart catheterization and stenting. S/P procedure patient was noted to not be responding correctly to wife and code stroke was called. On arrival patient is alert but confused at where he is and having a hard time following simple verbal commands. Due to receiving large dose of Effient during cardiac stenting, he was not a tPA candidate. Patient was brought CT for CTA head and neck to evaluate for possible intervention.   Date last known well: Date: 06/05/2014  Time last known well: Time: 13:25  tPA Given: No: patient was loaded with effient 60 mg   SUBJECTIVE (INTERVAL HISTORY) His wife is at the bedside.  Overall he feels his condition is resolved now. He is back to his baseline.   OBJECTIVE  Recent Labs Lab 06/05/14 1541  GLUCAP 84    Recent Labs Lab 06/03/14 1630 06/05/14 0451 06/06/14 0247 06/07/14 0403  NA 138 141 138 143  K 4.7 4.4 3.8 4.4  CL 101 103 101 105  CO2 27 28 21 28   GLUCOSE 93 100* 99 98  BUN 18 21 19 21   CREATININE 1.51* 1.60* 1.37* 1.58*  CALCIUM 10.1 9.5 8.5 9.1   No results found for this basename: AST, ALT, ALKPHOS, BILITOT, PROT, ALBUMIN,  in the last 168 hours  Recent Labs Lab 06/03/14 1630 06/04/14 0935 06/05/14 0451 06/06/14 0247 06/07/14 0403  WBC 5.7 5.0 4.3 6.6 4.5  HGB 13.5 13.4 12.7* 12.6* 12.4*  HCT 39.1 38.8* 37.8* 36.2* 36.8*  MCV 81.8 83.4 84.9 80.8 84.2  PLT 168 122* 127* 141* 122*    Recent Labs Lab 06/03/14 2212 06/04/14 0512 06/04/14 0935  TROPONINI 0.44* 0.89* 0.65*    Recent Labs  06/05/14 0451  LABPROT 13.1  INR 0.99   No results found for this basename: COLORURINE, APPERANCEUR, LABSPEC, PHURINE, GLUCOSEU, HGBUR, BILIRUBINUR, KETONESUR, PROTEINUR, UROBILINOGEN, NITRITE, LEUKOCYTESUR,  in the last 72 hours     Component Value Date/Time   CHOL 122 06/04/2014 0512   TRIG 440* 06/04/2014 0512   HDL 32* 06/04/2014 0512   CHOLHDL 3.8 06/04/2014 0512   VLDL UNABLE TO CALCULATE IF TRIGLYCERIDE OVER 400 mg/dL 06/04/2014 0512   LDLCALC UNABLE TO CALCULATE IF TRIGLYCERIDE OVER 400 mg/dL 06/04/2014 3893   Lab Results  Component Value Date   HGBA1C 5.8* 06/04/2014   No results found for this basename: labopia,  cocainscrnur,  labbenz,  amphetmu,  thcu,  labbarb    No results found for this basename: ETH,  in the last 168 hours   Ct Head Wo Contrast 06/05/2014    No acute findings    06/05/2014    1. No acute intracranial abnormalities. 2. Age related volume loss and minor chronic microvascular ischemic change.  06/07/14 Atrophy and microvascular ischemic disease without acute intracranial process.  Ct Angio Head & Neck W/cm &/or Wo/cm 06/05/2014   1. No major intracranial arterial occlusion or proximal stenosis. 2. Mild to moderate left MCA superior division branch vessel irregularity and attenuation. 3. No cervical carotid or vertebral artery stenosis.  2D Echocardiogram  EF 55-60% with no source of embolus.  EEG - normal EEG   PHYSICAL EXAM  Temp:  [98 F (36.7 C)-98.5 F (36.9 C)] 98.1 F (36.7 C) (09/02 2056) Pulse Rate:  [58-79] 69 (09/02 2056) Resp:  [18-20] 20 (09/02 2056) BP: (109-133)/(46-64) 115/46 mmHg (09/02 2056) SpO2:  [94 %-98 %] 94 % (09/02 2056)  Weight:  [213 lb 4.8 oz (96.752 kg)] 213 lb 4.8 oz (96.752 kg) (09/02 0431)  General - Well nourished, well developed, in no apparent distress.  Ophthalmologic - Sharp disc margins OU.  Cardiovascular - Regular rate and rhythm with no murmur.  Mental Status -  Level of arousal and orientation to time, place, and person were intact. Language including expression, naming, repetition, comprehension was assessed and found intact. Attention span and concentration were normal.  Cranial Nerves II - XII - II - Visual field intact OU. III, IV, VI - Extraocular movements intact. V - Facial sensation intact bilaterally. VII - Facial movement intact  bilaterally. VIII - Hearing & vestibular intact bilaterally. X - Palate elevates symmetrically. XI - Chin turning & shoulder shrug intact bilaterally. XII - Tongue protrusion intact.  Motor Strength - The patient's strength was normal in all extremities and pronator drift was absent.  Bulk was normal and fasciculations were absent.   Motor Tone - Muscle tone was assessed at the neck and appendages and was normal.  Reflexes - The patient's reflexes were 1+ in all extremities and he had no pathological reflexes.  Sensory - Light touch, temperature/pinprick were assessed and were normal.    Coordination - The patient had normal movements in the hands and feet with no ataxia or dysmetria.  Tremor was absent.  Gait and Station - not tested   ASSESSMENT/PLAN [name] is a 68 y.o. male with hx of CAD who post cath presented with confusion, disorientation, aphasia, not able to follow commands. He did not receive IV t-PA due to prior heparin drip and high ACT level. CT no acute abnormality and CTA negative for LVO. Not able to do MRI due to newly placed stent. Stroke work up underway.  Acute confusion post cath - Stroke vs. TIA most likely, but seizure and TGA are in ddx.   aspirin 81 mg orally every day prior to admission, now on aspirin 81 mg and plavix orally every day  CT - no acute stroke, need to repeat CT head in 24-48 hours.  CTA head & neck - no lg vessel stenosis/occlusion, mild to mod L MCA superior division branch irregularity  No MRI/MRA Due to recent stent  Repeat CT head showed no acute abnormality  2D Echo EF 50-60%, No source of embolus  EEG negative  HgbA1c 5.8 WNL  Heparin 5000 units sq tid for VTE prophylaxis  Cardiac thin liquids.   Up in chair  Continue stroke risk factor modification  Hypertension   Home meds:  Norvasc, lopressor, resumed in hospital  SBP 110-167 past 24h  Goal normotensive  Stable  Patient counseled to be compliant with  his blood pressure medications  Hyperlipidemia  Home meds:  lipitor 20. Dose increased to lipitor 80 mg daily  LDL unable to calculate  High TG  LDL goal < 70  CAD  - Acute NSTEMI this admission, on heparin drip - post cath with percutaneous intervention to LAD with DES placement  - ASA and plavix and statin  Other Stroke Risk Factors Advanced age   Family hx stroke (mother)  Hospital day # 4  Neurology will sign off. Please call with questions.  Rosalin Hawking, MD PhD Stroke Neurology 06/07/2014 10:15 PM    To contact Stroke Continuity provider, please refer to [email or web address] After hours, contact General Neurology

## 2014-06-07 NOTE — Progress Notes (Signed)
Subjective: Feeling a lot better.  His memory returned yesterday morning.  No CP  Objective: Vital signs in last 24 hours: Temp:  [98 F (36.7 C)-98.7 F (37.1 C)] 98.5 F (36.9 C) (09/02 0740) Pulse Rate:  [58-79] 58 (09/02 0740) Resp:  [11-25] 18 (09/02 0740) BP: (109-134)/(51-71) 123/64 mmHg (09/02 0740) SpO2:  [90 %-96 %] 96 % (09/02 0740) Weight:  [213 lb 4.8 oz (96.752 kg)-215 lb (97.523 kg)] 213 lb 4.8 oz (96.752 kg) (09/02 0431) Last BM Date: 06/06/14  Intake/Output from previous day:   Intake/Output this shift:    Medications Current Facility-Administered Medications  Medication Dose Route Frequency Provider Last Rate Last Dose  . 0.9 %  sodium chloride infusion  250 mL Intravenous PRN Sherren Mocha, MD      . acetaminophen (TYLENOL) tablet 650 mg  650 mg Oral Q4H PRN Toy Baker, MD   650 mg at 06/06/14 1935  . amLODipine (NORVASC) tablet 2.5 mg  2.5 mg Oral Daily Toy Baker, MD   2.5 mg at 06/06/14 0926  . aspirin EC tablet 81 mg  81 mg Oral Daily Jules Husbands, MD   81 mg at 06/06/14 6301  . atorvastatin (LIPITOR) tablet 80 mg  80 mg Oral q1800 Almyra Deforest, PA   80 mg at 06/06/14 2256  . clopidogrel (PLAVIX) tablet 75 mg  75 mg Oral Daily Sherren Mocha, MD   75 mg at 06/06/14 0926  . heparin injection 5,000 Units  5,000 Units Subcutaneous 3 times per day Kelvin Cellar, MD   5,000 Units at 06/06/14 2256  . metoprolol (LOPRESSOR) tablet 50 mg  50 mg Oral BID Toy Baker, MD   50 mg at 06/06/14 2256  . morphine 2 MG/ML injection 2 mg  2 mg Intravenous Q2H PRN Toy Baker, MD      . ondansetron (ZOFRAN) injection 4 mg  4 mg Intravenous Q6H PRN Toy Baker, MD   4 mg at 06/05/14 1940  . sodium chloride 0.9 % injection 3 mL  3 mL Intravenous Q12H Sherren Mocha, MD   3 mL at 06/06/14 0929  . sodium chloride 0.9 % injection 3 mL  3 mL Intravenous PRN Sherren Mocha, MD        PE: General appearance: alert, cooperative and no  distress Lungs: clear to auscultation bilaterally Heart: regular rate and rhythm, S1, S2 normal, no murmur, click, rub or gallop Extremities: No LEE Pulses: 2+ and symmetric Skin: Warm and dry Neurologic: Grossly normal  Lab Results:   Recent Labs  06/05/14 0451 06/06/14 0247 06/07/14 0403  WBC 4.3 6.6 4.5  HGB 12.7* 12.6* 12.4*  HCT 37.8* 36.2* 36.8*  PLT 127* 141* 122*   BMET  Recent Labs  06/05/14 0451 06/06/14 0247 06/07/14 0403  NA 141 138 143  K 4.4 3.8 4.4  CL 103 101 105  CO2 28 21 28   GLUCOSE 100* 99 98  BUN 21 19 21   CREATININE 1.60* 1.37* 1.58*  CALCIUM 9.5 8.5 9.1   PT/INR  Recent Labs  06/05/14 0451  LABPROT 13.1  INR 0.99    Assessment/Plan   Principal Problem:   NSTEMI (non-ST elevated myocardial infarction) Active Problems:   HYPERTRIGLYCERIDEMIA, HX OF   HYPERLIPIDEMIA   HYPERTENSION   CORONARY ATHEROSCLEROSIS NATIVE CORONARY ARTERY   Chronic kidney disease (CKD), stage III (moderate)   Chest pain   CVA (cerebral infarction)  Plan:   SP LAD stent with subsequent onset of confusion and aphasia on 8/31.  Code stroke called.  Neurology following.  CT head w/o contrast today: Atrophy and microvascular ischemic disease without acute intracranial process.  ASA, plavix, amlodipine 2.5, lopressor 50 bid, lipitor 80.   BP and HR stable.  SCr stable.  He looks ready for discharge.  Ambulate with cardiac rehab this morning.  Will wait for neuro to see today.        LOS: 4 days    HAGER, BRYAN PA-C 06/07/2014 8:17 AM As above, patient seen and examined. He denies chest pain or dyspnea. Minimal headache. He is status post PCI of his LAD. Continue aspirin, Plavix, beta blocker and statin. He had baseline renal insufficiency and has received dye both for cardiac catheterization and CTA of his neck. Repeat renal function tomorrow morning. Await neurology input. Ambulate today. Discharge tomorrow morning if stable. Kirk Ruths

## 2014-06-08 ENCOUNTER — Encounter (HOSPITAL_COMMUNITY): Payer: Self-pay | Admitting: Physician Assistant

## 2014-06-08 ENCOUNTER — Other Ambulatory Visit: Payer: Self-pay | Admitting: Physician Assistant

## 2014-06-08 DIAGNOSIS — N183 Chronic kidney disease, stage 3 (moderate): Secondary | ICD-10-CM

## 2014-06-08 LAB — BASIC METABOLIC PANEL
Anion gap: 13 (ref 5–15)
BUN: 22 mg/dL (ref 6–23)
CO2: 27 meq/L (ref 19–32)
Calcium: 9.4 mg/dL (ref 8.4–10.5)
Chloride: 104 mEq/L (ref 96–112)
Creatinine, Ser: 1.49 mg/dL — ABNORMAL HIGH (ref 0.50–1.35)
GFR calc Af Amer: 53 mL/min — ABNORMAL LOW (ref >90)
GFR, EST NON AFRICAN AMERICAN: 46 mL/min — AB (ref >90)
GLUCOSE: 113 mg/dL — AB (ref 70–99)
POTASSIUM: 4.1 meq/L (ref 3.7–5.3)
Sodium: 144 mEq/L (ref 137–147)

## 2014-06-08 LAB — CBC
HEMATOCRIT: 40.9 % (ref 39.0–52.0)
HEMOGLOBIN: 13.7 g/dL (ref 13.0–17.0)
MCH: 28.5 pg (ref 26.0–34.0)
MCHC: 33.5 g/dL (ref 30.0–36.0)
MCV: 85.2 fL (ref 78.0–100.0)
Platelets: 148 10*3/uL — ABNORMAL LOW (ref 150–400)
RBC: 4.8 MIL/uL (ref 4.22–5.81)
RDW: 13.9 % (ref 11.5–15.5)
WBC: 5.4 10*3/uL (ref 4.0–10.5)

## 2014-06-08 MED ORDER — CLOPIDOGREL BISULFATE 75 MG PO TABS: 75.0000 mg | ORAL_TABLET | Freq: Every day | ORAL | Status: DC

## 2014-06-08 NOTE — Progress Notes (Signed)
Physical Therapy Treatment Patient Details Name: [name] MRN: [id number] DOB: 1945/12/14 Today's Date: 06/11/14    History of Present Illness Pt is a very active 68 y/o male admitted with chest pain. While talking on a phone he started to have chest pain on the right side felt like pressure no shortness of breath or nausea this lasted for about 2 hours. He took some Tums and it eased off but continued to come back a bit. Pain spread to right arm. He took some aspirin. The pain continued to come and go and his wife brought him to the ED. Pt with NSTEMI and underwent L heart cath on 06/05/14. After the procedure pt with AMS and code stroke called.     PT Comments    Pt making steady progress with mobility. At this time not recommending any PT follow up, however pt does still demonstrate decreased balance and activity tolerance with this acute stay. May benefit from outpatient therapy if this does not improve with normal daily activities at home (pt works on a farm).   Follow Up Recommendations  No PT follow up (may need Outpatient PT if balance/activity tolerance does not return/improve)     Equipment Recommendations  None recommended by PT       Precautions / Restrictions Precautions Precautions: Fall Restrictions Weight Bearing Restrictions: No    Mobility  Bed Mobility               General bed mobility comments: not assessed-pt in recliner before and after therapy  Transfers Overall transfer level: Modified independent Equipment used: None Transfers: Sit to/from Stand Sit to Stand: Modified independent (Device/Increase time)            Ambulation/Gait Ambulation/Gait assistance: Supervision Ambulation Distance (Feet): 250 Feet Assistive device: None Gait Pattern/deviations: Step-through pattern;Decreased stride length;Drifts right/left;Trunk flexed Gait velocity: Decreased Gait velocity interpretation: Below normal speed for age/gender General Gait  Details: pt veers/drifts left/right with head movements/enviroment scanning. No LOB noted with gait, mostly steady.    Stairs Stairs: Yes Stairs assistance: Supervision Stair Management: Alternating pattern;One rail Right Number of Stairs: 10 General stair comments: no cues or assist needed with stairs.  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 PT Goal Formulation: With patient/family Time For Goal Achievement: 06/13/14 Potential to Achieve Goals: Good Progress towards PT goals: Progressing toward goals    Frequency  Min 3X/week    PT Plan Current plan remains appropriate       End of Session Equipment Utilized During Treatment: Gait belt Activity Tolerance: Patient tolerated treatment well Patient left: in chair;with family/visitor present;with call bell/phone within reach     Time: 1010-1020 PT Time Calculation (min): 10 min  Charges:  $Gait Training: 8-22 mins                    G Codes:      Willow Ora 2014/06/11,  10:23 AM  Willow Ora, PTA Office- [phone]

## 2014-06-08 NOTE — Progress Notes (Signed)
Patient Name: [name] Date of Encounter: 06/08/2014     Principal Problem:   NSTEMI (non-ST elevated myocardial infarction) Active Problems:   HYPERTRIGLYCERIDEMIA, HX OF   HYPERLIPIDEMIA   HYPERTENSION   CORONARY ATHEROSCLEROSIS NATIVE CORONARY ARTERY   Chronic kidney disease (CKD), stage III (moderate)   Chest pain   CVA (cerebral infarction)    SUBJECTIVE  No CP or SOB. No weakness.  CURRENT MEDS . amLODipine  2.5 mg Oral Daily  . aspirin EC  81 mg Oral Daily  . atorvastatin  80 mg Oral q1800  . clopidogrel  75 mg Oral Daily  . heparin  5,000 Units Subcutaneous 3 times per day  . metoprolol  50 mg Oral BID  . sodium chloride  3 mL Intravenous Q12H    OBJECTIVE  Filed Vitals:   06/07/14 2056 06/08/14 0000 06/08/14 0436 06/08/14 0845  BP: 115/46 122/53 119/54 135/64  Pulse: 69 75 59 71  Temp: 98.1 F (36.7 C) 98.3 F (36.8 C) 98.1 F (36.7 C) 97.9 F (36.6 C)  TempSrc: Oral Oral Oral Oral  Resp: 20 20 20 20   Height:      Weight:   215 lb 1.6 oz (97.569 kg)   SpO2: 94% 96% 96% 98%    Intake/Output Summary (Last 24 hours) at 06/08/14 0952 Last data filed at 06/07/14 2158  Gross per 24 hour  Intake    353 ml  Output      0 ml  Net    353 ml   Filed Weights   06/06/14 1628 06/07/14 0431 06/08/14 0436  Weight: 215 lb (97.523 kg) 213 lb 4.8 oz (96.752 kg) 215 lb 1.6 oz (97.569 kg)    PHYSICAL EXAM  General: Pleasant, NAD. Neuro: Alert and oriented X 3. Moves all extremities spontaneously. Psych: Normal affect. HEENT:  Normal  Neck: Supple without bruits or JVD. Lungs:  Resp regular and unlabored, CTA. Heart: RRR no s3, s4, or murmurs. Abdomen: Soft, non-tender, non-distended, BS + x 4.  Extremities: No clubbing, cyanosis or edema. DP/PT/Radials 2+ and equal bilaterally.  Accessory Clinical Findings  CBC  Recent Labs  06/06/14 0247 06/07/14 0403  WBC 6.6 4.5  HGB 12.6* 12.4*  HCT 36.2* 36.8*  MCV 80.8 84.2  PLT 141* 122*   Basic  Metabolic Panel  Recent Labs  06/06/14 0247 06/07/14 0403  NA 138 143  K 3.8 4.4  CL 101 105  CO2 21 28  GLUCOSE 99 98  BUN 19 21  CREATININE 1.37* 1.58*  CALCIUM 8.5 9.1    TELE NSR with HR 60s, no significant ventricular ectopy overnight.    ECG  NSR with HR 60s, no significant ST-T wave changes  Echocardiogram 06/04/2014 LV EF: 55% - 60%  ------------------------------------------------------------------- Indications: Chest pain 786.51.  ------------------------------------------------------------------- History: Risk factors: Hypertension. Dyslipidemia.  ------------------------------------------------------------------- Study Conclusions  - Left ventricle: The cavity size was normal. Systolic function was normal. The estimated ejection fraction was in the range of 55% to 60%. Wall motion was normal; there were no regional wall motion abnormalities. There was an increased relative contribution of atrial contraction to ventricular filling. Doppler parameters are consistent with abnormal left ventricular relaxation (grade 1 diastolic dysfunction). - Aortic valve: Trileaflet; normal thickness, mildly calcified leaflets. - Mitral valve: There was mild regurgitation. - Left atrium: The atrium was mildly dilated.      Radiology/Studies  Ct Angio Head W/cm &/or Wo Cm  06/05/2014   ADDENDUM REPORT: 06/05/2014 17:59  ADDENDUM: Examination  read by Dr. Jeralyn Ruths. Results discussed with Dr. Doy Mince by myself 06/05/2014 5:42 p.m.   Electronically Signed   By: Chauncey Cruel M.D.   On: 06/05/2014 17:59   06/05/2014   CLINICAL DATA:  Aphasia.  EXAM: CT ANGIOGRAPHY HEAD AND NECK  TECHNIQUE: Multidetector CT imaging of the head and neck was performed using the standard protocol during bolus administration of intravenous contrast. Multiplanar CT image reconstructions and MIPs were obtained to evaluate the vascular anatomy. Carotid stenosis measurements (when applicable) are  obtained utilizing NASCET criteria, using the distal internal carotid diameter as the denominator.  CONTRAST:  70mL OMNIPAQUE IOHEXOL 350 MG/ML SOLN  COMPARISON:  Head CT 06/05/2014  FINDINGS: CTA HEAD FINDINGS  Postcontrast head CT is without evidence of abnormal enhancement. No acute large territory infarct, intracranial hemorrhage, mass, midline shift, or extra-axial fluid collection is identified. There is mild generalized cerebral atrophy. Orbits are unremarkable. Mastoid air cells are clear. There is minimal right maxillary sinus mucosal thickening.  The intracranial vertebral arteries are patent to the basilar bilaterally. PICA origins are patent. The right AICA is dominant. SCA origins are patent. Basilar artery is patent without stenosis. PCAs are unremarkable. There is a small, patent posterior communicating artery on the right.  Internal carotid arteries are patent from skullbase to carotid termini. There is mild bilateral carotid siphon irregularity and calcification without evidence of significant stenosis. M1 segments are patent without stenosis. There is mild to moderate left MCA superior division branch vessel irregularity and attenuation in the region of the frontal operculum. The right A1 segment appears hypoplastic. Anterior communicating artery is patent. ACAs are otherwise unremarkable.  Review of the MIP images confirms the above findings.  CTA NECK FINDINGS  Three vessel aortic arch. The proximal right common carotid artery is mildly tortuous and medialized. The common carotid arteries are otherwise unremarkable without stenosis. The right cervical internal carotid artery is mildly small in caliber diffusely compared to the left without focal stenosis, and this may be developmental given the hypoplastic right A1 segment. Vertebral arteries are patent and codominant without stenosis.  There is mild biapical scarring in the lungs. A 5 mm stone is noted anteriorly in the left submandibular gland  without ductal dilatation or inflammatory change to suggest acute sialoadenitis. Moderate multilevel cervical spondylosis is noted.  Review of the MIP images confirms the above findings.  IMPRESSION: 1. No major intracranial arterial occlusion or proximal stenosis. 2. Mild to moderate left MCA superior division branch vessel irregularity and attenuation. 3. No cervical carotid or vertebral artery stenosis. The neurology service has been paged to communicate these findings.  Electronically Signed: By: Logan Bores On: 06/05/2014 17:36   Dg Chest 2 View  06/03/2014   CLINICAL DATA:  Mid right chest pain.  EXAM: CHEST  2 VIEW  COMPARISON:  02/11/2010  FINDINGS: The heart size and mediastinal contours are within normal limits. Both lungs are clear. The visualized skeletal structures are unremarkable.  IMPRESSION: No active cardiopulmonary disease.   Electronically Signed   By: Rolm Baptise M.D.   On: 06/03/2014 19:17   Ct Head Wo Contrast  06/07/2014   CLINICAL DATA:  Post stent placement yesterday, now with memory loss. Difficulty with speech.  EXAM: CT HEAD WITHOUT CONTRAST  TECHNIQUE: Contiguous axial images were obtained from the base of the skull through the vertex without intravenous contrast.  COMPARISON:  06/05/2014 (multiple examinations)  FINDINGS: Unchanged findings of mild atrophy with diffuse sulcal prominence and prominence of the bifrontal extra-axial  spaces. Scattered periventricular hypodensities compatible with microvascular ischemic disease. No CT evidence of acute large territory infarct. No intraparenchymal extra-axial mass or hemorrhage. Unchanged size and configuration of the ventricles and basilar cisterns. No midline shift. There is mild leftward deviation of the nasal bones. Paranasal sinuses and mastoid air cells are normally aerated. No air-fluid levels. Regional soft tissues appear normal. No displaced calvarial fracture.  IMPRESSION: Atrophy and microvascular ischemic disease without  acute intracranial process.   Electronically Signed   By: Sandi Mariscal M.D.   On: 06/07/2014 07:40   Ct Head Wo Contrast  06/05/2014   CLINICAL DATA:  Increasingly altered mental status  EXAM: CT HEAD WITHOUT CONTRAST  TECHNIQUE: Contiguous axial images were obtained from the base of the skull through the vertex without intravenous contrast.  COMPARISON:  06/05/2014  FINDINGS: No hemorrhage or extra-axial fluid. There is age-related cortical atrophy. No evidence of infarct or mass. No hydrocephalus. Calvarium is intact.  IMPRESSION: No acute findings   Electronically Signed   By: Skipper Cliche M.D.   On: 06/05/2014 22:02   Ct Head Wo Contrast  06/05/2014   CLINICAL DATA:  Confusion. A ectasia. Headache. Status post cardiac catheterization today.  EXAM: CT HEAD WITHOUT CONTRAST  TECHNIQUE: Contiguous axial images were obtained from the base of the skull through the vertex without intravenous contrast.  COMPARISON:  None.  FINDINGS: Ventricles are normal in configuration. There is mild ventricular and sulcal enlargement consistent with age related volume loss. There are no parenchymal masses or mass effect. Minor periventricular white matter hypoattenuation is noted consistent with chronic microvascular ischemic change.  There is no evidence of an infarct.  No extra-axial masses or abnormal fluid collections.  There is no intracranial hemorrhage.  Sinuses and mastoid air cells are clear.  IMPRESSION: 1. No acute intracranial abnormalities. 2. Age related volume loss and minor chronic microvascular ischemic change. These results were called by telephone at the time of interpretation on 06/05/2014 at 4:08 pm to Dr. Doy Mince, who verbally acknowledged these results.   Electronically Signed   By: Lajean Manes M.D.   On: 06/05/2014 16:09   Ct Angio Neck W/cm &/or Wo/cm  06/05/2014   ADDENDUM REPORT: 06/05/2014 17:59  ADDENDUM: Examination read by Dr. Jeralyn Ruths. Results discussed with Dr. Doy Mince by myself 06/05/2014  5:42 p.m.   Electronically Signed   By: Chauncey Cruel M.D.   On: 06/05/2014 17:59   06/05/2014   CLINICAL DATA:  Aphasia.  EXAM: CT ANGIOGRAPHY HEAD AND NECK  TECHNIQUE: Multidetector CT imaging of the head and neck was performed using the standard protocol during bolus administration of intravenous contrast. Multiplanar CT image reconstructions and MIPs were obtained to evaluate the vascular anatomy. Carotid stenosis measurements (when applicable) are obtained utilizing NASCET criteria, using the distal internal carotid diameter as the denominator.  CONTRAST:  31mL OMNIPAQUE IOHEXOL 350 MG/ML SOLN  COMPARISON:  Head CT 06/05/2014  FINDINGS: CTA HEAD FINDINGS  Postcontrast head CT is without evidence of abnormal enhancement. No acute large territory infarct, intracranial hemorrhage, mass, midline shift, or extra-axial fluid collection is identified. There is mild generalized cerebral atrophy. Orbits are unremarkable. Mastoid air cells are clear. There is minimal right maxillary sinus mucosal thickening.  The intracranial vertebral arteries are patent to the basilar bilaterally. PICA origins are patent. The right AICA is dominant. SCA origins are patent. Basilar artery is patent without stenosis. PCAs are unremarkable. There is a small, patent posterior communicating artery on the right.  Internal carotid arteries  are patent from skullbase to carotid termini. There is mild bilateral carotid siphon irregularity and calcification without evidence of significant stenosis. M1 segments are patent without stenosis. There is mild to moderate left MCA superior division branch vessel irregularity and attenuation in the region of the frontal operculum. The right A1 segment appears hypoplastic. Anterior communicating artery is patent. ACAs are otherwise unremarkable.  Review of the MIP images confirms the above findings.  CTA NECK FINDINGS  Three vessel aortic arch. The proximal right common carotid artery is mildly tortuous  and medialized. The common carotid arteries are otherwise unremarkable without stenosis. The right cervical internal carotid artery is mildly small in caliber diffusely compared to the left without focal stenosis, and this may be developmental given the hypoplastic right A1 segment. Vertebral arteries are patent and codominant without stenosis.  There is mild biapical scarring in the lungs. A 5 mm stone is noted anteriorly in the left submandibular gland without ductal dilatation or inflammatory change to suggest acute sialoadenitis. Moderate multilevel cervical spondylosis is noted.  Review of the MIP images confirms the above findings.  IMPRESSION: 1. No major intracranial arterial occlusion or proximal stenosis. 2. Mild to moderate left MCA superior division branch vessel irregularity and attenuation. 3. No cervical carotid or vertebral artery stenosis. The neurology service has been paged to communicate these findings.  Electronically Signed: By: Logan Bores On: 06/05/2014 17:36    ASSESSMENT AND PLAN  1. NSTEMI  - Cath 06/05/14: LM mild irregs, pLAD 70%--> s/p PCI/DES (Promus DES), mLAD 50%, D1 75% ostial, LCx w/o significant obs, mLCx stent w/o restenosis, pRCA  30%, mRCA 50-60%  - Echo 06/04/2014 EF 27-06%, grade 1 diastolic dysfunction  - continue ASA, plavix, lipitor, amlodipine and metoprolol  - likely ok to be discharged today, pending BMET to check Cr which transiently increased to 1.5 from 1.3. Potentially BMET in 1 wk after discharge  - no lifting > 5lbs for 1-2 wks. Need DAPT for 1 year. Scheduled follow up  2. CVA vs stroke: neuro following  - code stroke called due to AMS after cath, not a candidate for TPA due to antiplatelet loading  - repeat CTA 9/2 no acute process, EEG negative for seizure  - neurology signed off 3. CAD 4. Stage III CKD 5. HTN 6. Hyperlipidemia 7. DM  Signed, Almyra Deforest PA-C Pager: [phone] As above, patient seen and examined. He denies chest pain or  dyspnea. Renal function is stable. Patient can be discharged from a cardiac standpoint. Continue aspirin, Plavix, statin and beta blocker. Followup with cardiology has been arranged. Kirk Ruths

## 2014-06-08 NOTE — Discharge Instructions (Signed)
Acute Coronary Syndrome  Acute coronary syndrome (ACS) is an urgent problem in which the blood and oxygen supply to the heart is critically deficient. ACS requires hospitalization because one or more coronary arteries may be blocked.  ACS represents a range of conditions including:  · Previous angina that is now unstable, lasts longer, happens at rest, or is more intense.  · A heart attack, with heart muscle cell injury and death.  There are three vital coronary arteries that supply the heart muscle with blood and oxygen so that it can pump blood effectively. If blockages to these arteries develop, blood flow to the heart muscle is reduced. If the heart does not get enough blood, angina may occur as the first warning sign.  SYMPTOMS   · The most common signs of angina include:  ¨ Tightness or squeezing in the chest.  ¨ Feeling of heaviness on the chest.  ¨ Discomfort in the arms, neck, back, or jaw.  ¨ Shortness of breath and nausea.  ¨ Cold, wet skin.  · Angina is usually brought on by physical effort or excitement which increase the oxygen needs of the heart. These states increase the blood flow needs of the heart beyond what can be delivered.  · Other symptoms that are not as common include:  ¨ Fatigue  ¨ Unexplained feelings of nervousness or anxiety  ¨ Weakness  ¨ Diarrhea  · Sometimes, you may not have noticed any symptoms at all but still suffered a cardiac injury.  TREATMENT   · Medicines to help discomfort may include nitroglycerin (nitro) in the form of tablets or a spray for rapid relief, or longer-acting forms such as cream, patches, or capsules. (Be aware that there are many side effects and possible interactions with other drugs).  · Other medicines may be used to help the heart pump better.  · Procedures to open blocked arteries including angioplasty or stent placement to keep the arteries open.  · Open heart surgery may be needed when there are many blockages or they are in critical locations that  are best treated with surgery.  HOME CARE INSTRUCTIONS   · Do not use any tobacco products including cigarettes, chewing tobacco, or electronic cigarettes.  · Take one baby or adult aspirin daily, if your health care provider advises. This helps reduce the risk of a heart attack.  · It is very important that you follow the angina treatment prescribed by your health care provider. Make arrangements for proper follow-up care.  · Eat a heart healthy diet with salt and fat restrictions as advised.  · Regular exercise is good for you as long as it does not cause discomfort. Do not begin any new type of exercise until you check with your health care provider.  · If you are overweight, you should lose weight.  · Try to maintain normal blood lipid levels.  · Keep your blood pressure under control as recommended by your health care provider.  · You should tell your health care provider right away about any increase in the severity or frequency of your chest discomfort or angina attacks. When you have angina, you should stop what you are doing and sit down. This may bring relief in 3 to 5 minutes. If your health care provider has prescribed nitro, take it as directed.  · If your health care provider has given you a follow-up appointment, it is very important to keep that appointment. Not keeping the appointment could result in a chronic or   of breath.  You feel faint, lightheaded, or pass out.  Your chest discomfort gets worse.  You are sweating or experience sudden profound fatigue.  You do not get relief of your chest pain after 3 doses of nitro.  Your discomfort lasts longer than 15 minutes. MAKE SURE YOU:   Understand these instructions.  Will watch your condition.  Will get help right  away if you are not doing well or get worse.  Take all medicines as directed by your health care provider. Document Released: 09/22/2005 Document Revised: 09/27/2013 Document Reviewed: 01/24/2014 [facility] Patient Information 2015 [place], [place]. This information is not intended to replace advice given to you by your health care provider. Make sure you discuss any questions you have with your health care provider.   No driving for 24 hours. No lifting over 5 lbs for 1 week. No sexual activity for 1 week. Keep procedure site clean & dry. If you notice increased pain, swelling, bleeding or pus, call/return!  You may shower, but no soaking baths/hot tubs/pools for 1 week.     Chest Pain (Nonspecific) It is often hard to give a diagnosis for the cause of chest pain. There is always a chance that your pain could be related to something serious, such as a heart attack or a blood clot in the lungs. You need to follow up with your doctor. HOME CARE  If antibiotic medicine was given, take it as directed by your doctor. Finish the medicine even if you start to feel better.  For the next few days, avoid activities that bring on chest pain. Continue physical activities as told by your doctor.  Do not use any tobacco products. This includes cigarettes, chewing tobacco, and e-cigarettes.  Avoid drinking alcohol.  Only take medicine as told by your doctor.  Follow your doctor's suggestions for more testing if your chest pain does not go away.  Keep all doctor visits you made. GET HELP IF:  Your chest pain does not go away, even after treatment.  You have a rash with blisters on your chest.  You have a fever. GET HELP RIGHT AWAY IF:   You have more pain or pain that spreads to your arm, neck, jaw, back, or belly (abdomen).  You have shortness of breath.  You cough more than usual or cough up blood.  You have very bad back or belly pain.  You feel sick to your stomach (nauseous) or throw  up (vomit).  You have very bad weakness.  You pass out (faint).  You have chills. This is an emergency. Do not wait to see if the problems will go away. Call your local emergency services (911 in [place] Do not drive yourself to the hospital. MAKE SURE YOU:   Understand these instructions.  Will watch your condition.  Will get help right away if you are not doing well or get worse. Document Released: 03/10/2008 Document Revised: 09/27/2013 Document Reviewed: 03/10/2008 [facility] Patient Information 2015 [place], [place]. This information is not intended to replace advice given to you by your health care provider. Make sure you discuss any questions you have with your health care provider.  Heart Disease Prevention Heart disease can lead to heart attacks and strokes. This is a leading cause of death. Heart disease can be inherited and can be caused from the lifestyle you lead. You can do a lot to keep your heart and blood vessels healthy.  WHAT SHOULD I DO EACH DAY TO KEEP MY HEART HEALTHY?  Do not smoke.  Follow a healthy eating plan as recommended by your caregiver or dietitian.  Be active for a total of 30 minutes most days. Ask your caregiver what activities are best for you.  Limit the amount of alcohol you drink.  Involve family and friends to help you with a healthy lifestyle. HOW DOES HEART DISEASE CAUSE HIGH BLOOD PRESSURE?  Narrowed blood vessels leave a smaller opening for blood to flow through. It is like turning on a garden hose and holding your thumb over the opening. The smaller opening makes the water shoot out with more pressure. In the same way, narrowed blood vessels can lead to high blood pressure. Other factors, such as kidney problems and being overweight, also can lead to high blood pressure.  If you have high blood pressure you may need to take blood pressure medicine every day. Some types of blood pressure medicine can also help keep your kidneys  healthy.  Many people with diabetes also have high blood pressure. If you have heart, eye, or kidney problems from diabetes, high blood pressure can make them worse. HOW DO MY BLOOD VESSELS GET CLOGGED?  Cholesterol is a substance that is made by the body and used for many important functions. It is also found in food that comes from animals. When your cholesterol is high, it can stick to the insides of your blood vessels, making them narrowed and even clogged. This problem is called atherosclerosis.  Narrowed and clogged blood vessels make it harder for blood to get to important body organs. This can cause problems such as:  Chest pain (angina). Angina can cause temporary pain in your chest, arms, shoulders, or back. You may feel the pain more when your heart beats faster, such as when you exercise. The pain may go away when you rest. You also may feel very weak and sweaty.  A heart attack. A heart attack happens when a blood vessel in or near the heart becomes blocked. Not enough blood is getting to the heart. During a heart attack, you may have chest pain in your chest, arms, shoulders, or back along with nausea, indigestion, extreme weakness, and sweating. WHAT CAN I DO TO PREVENT HEART DISEASE?   Keep your blood pressure under control as recommended by your caregiver.  Keep your cholesterol under control. Have it checked at least once a year. Target cholesterol levels for most people are:  Total blood cholesterol level: Below 200.  LDL (bad) cholesterol: Below 100.  HDL (good) cholesterol: Above 40 in men and above 50 in women.  Triglycerides (another type of fat in the blood): Below 150.  Make physical activity a part of your daily routine. Check with your caregiver to learn what activities are best for you.  Make sure that the foods you eat are "heart-healthy."  Include foods high in fiber, such as oat bran, oatmeal, whole-grain breads and cereals.  Cut back on fried foods and  foods high in saturated fat. This includes foods such as meats, butter, whole dairy products, shortening, and coconut or palm oil.  Avoid salty foods such as canned food, luncheon meat, salty snacks, and fast food.  Eat more fruits and vegetables.  Drink less alcohol.  Lose weight as recommended by your caregiver.  If you smoke, quit. Your caregiver can help you with quitting options.  Ask your caregiver whether you should take a daily aspirin. Studies have shown that taking aspirin can help reduce your risk of heart disease and stroke.  Take your prescribed medicines as directed. WHAT ARE THE WARNING SIGNS OF A HEART ATTACK? You may have one or more of the following warning signs:  Chest pain or discomfort.  Pain or discomfort in your arms, back, jaw, or neck.  Indigestion or stomach pain.  Shortness of breath.  Sweating.  Nausea or vomiting.  Lightheadedness.  No warning signs at all or they may come and go. FOR MORE INFORMATION  To find out more about heart disease and stroke prevention, visit the American Heart Association website at www.americanheart.org Document Released: 05/06/2004 Document Revised: 03/23/2012 Document Reviewed: 11/16/2013 [facility] Patient Information 2015 [place], [place]. This information is not intended to replace advice given to you by your health care provider. Make sure you discuss any questions you have with your health care provider.  Ischemic Stroke Blood carries oxygen to all areas of your body. A stroke happens when your blood does not flow to your brain like normal. If this happens, your brain will not get the oxygen it needs and brain tissue will die. This is an emergency. Problems (symptoms) of a stroke usually happen suddenly. You may notice them when you wake up. They can include:  Loss of feeling or weakness on one side of the body (face, arm, leg).  Feeling confused.  Trouble talking or understanding.  Trouble seeing.  Trouble  walking.  Feeling dizzy.  Loss of balance or coordination.  Severe headache without a cause.  Trouble reading or writing. Get help as soon as any of these problems first start. This is important.  RISK FACTORS  Risk factors are things that make it more likely for you to have a stroke. These things include:  High blood pressure (hypertension).  High cholesterol.  Diabetes.  Heart disease.  Having a buildup of fatty deposits in the blood vessels.  Having an abnormal heart rhythm (atrial fibrillation).  Being very overweight (obese).  Smoking.  Taking birth control pills, especially if you smoke.  Not being active.  Having a diet high in fats, salt, and calories.  Drinking too much alcohol.  Using illegal drugs.  Being African American.  Being over the age of 45.  Having a family history of stroke.  Having a history of blood clots, stroke, warning stroke (transient ischemic attack, TIA), or heart attack.  Sickle cell disease. HOME CARE  Take all medicines exactly as told by your doctor. Understand all your medicine instructions.  You may need to take a medicine to thin your blood, like aspirin or warfarin. Take warfarin exactly as told.  Taking too much or too little warfarin is dangerous. Get regular blood tests as told, including the PT and INR tests. The test results help your doctor adjust your dose of warfarin. Your PT and INR levels must be done as often as told by your doctor.  Food can cause problems with warfarin and affect the results of your blood tests. This is true for foods high in vitamin K, such as spinach, kale, broccoli, cabbage, collard and turnip greens, Brussels sprouts, peas, cauliflower, seaweed, and parsley, as well as beef and pork liver, green tea, and soybean oil. Eat the same amount of food high in vitamin K. Avoid major changes in your diet. Tell your doctor before changing your diet. Talk to a food specialist (dietitian) if you have  questions.  Many medicines can cause problems with warfarin and affect your PT and INR test results. Tell your doctor about all medicines you take. This includes vitamins and  dietary pills (supplements). Be careful with aspirin and medicines that relieve redness, soreness, and puffiness (inflammation). Do not take or stop medicines unless your doctor tells you to.  Warfarin can cause a lot of bruising or bleeding. Hold pressure over cuts for longer than normal. Talk to your doctor about other side effects of warfarin.  Avoid sports or activities that may cause injury or bleeding.  Be careful when you shave, floss your teeth, or use sharp objects.  Avoid alcoholic drinks or drink very little alcohol while taking warfarin. Tell your doctor if you change how much alcohol you drink.  Tell your dentist and other doctors that you take warfarin before procedures.  If you are able to swallow, eat healthy foods. Eat 5 or more servings of fruits and vegetables a day. Eat soft foods, pureed foods, or eat small bites of food so you do not choke.  Follow your diet program as told, if you are given one.  Keep a healthy weight.  Stay active. Try to get at least 30 minutes of activity on most or all days.  Do not smoke.  Limit how much alcohol you drink even if you are not taking warfarin. Moderate alcohol use is:  No more than 2 drinks each day for men.  No more than 1 drink each day for women who are not pregnant.  Keep your home safe so you do not fall. Try:  Putting grab bars in the bedroom and bathroom.  Raising toilet seats.  Putting a seat in the shower.  Go to therapy sessions (physical, occupational, and speech) as told by your doctor.  Use a walker or cane at all times if told to do so.  Keep all doctor visits as told. GET HELP IF:  Your personality changes.  You have trouble swallowing.  You are seeing two of everything.  You are dizzy.  You have a fever.  Your skin  starts to break down. GET HELP RIGHT AWAY IF:  The symptoms below may be a sign of an emergency. Do not wait to see if the symptoms go away. Call for help (911 in [place] Do not drive yourself to the hospital.  You have sudden weakness or numbness on the face, arm, or leg (especially on one side of the body).  You have sudden trouble walking or moving your arms or legs.  You have sudden confusion.  You have trouble talking or understanding.  You have sudden trouble seeing in one or both eyes.  You lose your balance or your movements are not smooth.  You have a sudden, severe headache with no known cause.  You have new chest pain or you feel your heart beating in an unsteady way.  You are partly or totally unaware of what is going on around you. Document Released: 09/11/2011 Document Revised: 02/06/2014 Document Reviewed: 05/02/2012 [facility] Patient Information 2015 [place], [place]. This information is not intended to replace advice given to you by your health care provider. Make sure you discuss any questions you have with your health care provider.  Stroke Prevention Some health problems and behaviors may make it more likely for you to have a stroke. Below are ways to lessen your risk of having a stroke.   Be active for at least 30 minutes on most or all days.  Do not smoke. Try not to be around others who smoke.  Do not drink too much alcohol.  Do not have more than 2 drinks a day if you  are a man.  Do not have more than 1 drink a day if you are a woman and are not pregnant.  Eat healthy foods, such as fruits and vegetables. If you were put on a specific diet, follow the diet as told.  Keep your cholesterol levels under control through diet and medicines. Look for foods that are low in saturated fat, trans fat, cholesterol, and are high in fiber.  If you have diabetes, follow all diet plans and take your medicine as told.  If you have high blood pressure (hypertension),  follow all diet plans and take your medicine as told.  Keep a healthy weight. Eat foods that are low in calories, salt, saturated fat, trans fat, and cholesterol.  Do not take drugs.  Avoid birth control pills, if this applies. Talk to your doctor about the risks of taking birth control pills.  Talk to your doctor if you have sleep problems (sleep apnea).  Take all medicine as told by your doctor.  You may be told to take aspirin or blood thinner medicine. Take this medicine as told by your doctor.  Understand your medicine instructions.  Make sure any other conditions you have are being taken care of. GET HELP RIGHT AWAY IF:  You suddenly lose feeling (you feel numb) or have weakness in your face, arm, or leg.  Your face or eyelid hangs down to one side.  You suddenly feel confused.  You have trouble talking (aphasia) or understanding what people are saying.  You suddenly have trouble seeing in one or both eyes.  You suddenly have trouble walking.  You are dizzy.  You lose your balance or your movements are clumsy (uncoordinated).  You suddenly have a very bad headache and you do not know the cause.  You have new chest pain.  Your heart feels like it is fluttering or skipping a beat (irregular heartbeat). Do not wait to see if the symptoms above go away. Get help right away. Call your local emergency services (911 in [place] Do not drive yourself to the hospital. Document Released: 03/23/2012 Document Revised: 02/06/2014 Document Reviewed: 03/25/2013 [facility] Patient Information 2015 [place], [place]. This information is not intended to replace advice given to you by your health care provider. Make sure you discuss any questions you have with your health care provider.

## 2014-06-08 NOTE — Discharge Summary (Addendum)
Physician Discharge Summary  [name] JFH:[id number] DOB: [birth year] DOA: 06/03/2014  PCP: Unice Cobble, MD  Admit date: 06/03/2014 Discharge date: 06/21/2014  Time spent: 35 minutes  Recommendations for Outpatient Follow-up:  1. Follow up with cardiology in 2-4 weeks.  Discharge Diagnoses:  Principal Problem:   NSTEMI (non-ST elevated myocardial infarction) Active Problems:   HYPERTRIGLYCERIDEMIA, HX OF   HYPERLIPIDEMIA   HYPERTENSION   CORONARY ATHEROSCLEROSIS NATIVE CORONARY ARTERY   Chronic kidney disease (CKD), stage III (moderate)   Chest pain   Discharge Condition: stable  Diet recommendation: heart healthy  Filed Weights   06/06/14 1628 06/07/14 0431 06/08/14 0436  Weight: 97.523 kg (215 lb) 96.752 kg (213 lb 4.8 oz) 97.569 kg (215 lb 1.6 oz)    History of present illness:  68 y.o. male  has a past medical history of Hypertension; Hyperlipidemia; Anemia, mild; Vitamin B12 deficiency; Prostate cancer; Skin cancer; CAD (coronary artery disease); Renal insufficiency; echocardiogram; Melanoma; and Pulmonary embolism.  Presented with  Patient has hx of CAD sp stenting in 2011. Today while talking on a phone he started to have chest pain on the right side felt like pressure no shortness of breath or nausea this lasted for about 2 hours. He took some Tums and it eased off but continued to come back a bit. Pain spread to right arm. He took some aspirin. The pain continued to come and go and his wife took him to the hospital. Patient refused nitroglycerine because he had synopsized from that in the past. Currently chest pain free. His has not had any cardiac studies since 2011.    Hospital Course:  Non-ST segment elevation myocardial infarction  - Has history of established coronary artery disease with non-ST segment elevation myocardial infarct in 2001, status post percutaneous intervention to left circumflex with a DES.  - Status post cardiac catheterization on  06/05/2014 which showed severe stenosis of proximal LAD, status post percutaneous intervention with single drug-eluting stent  - Cardiology recommending aspirin and  Plavix.  - Postprocedure he developed sudden onset confusion, disorientation, trouble following commands.  - Neurology consulted, CTA does not show any evidence of stroke.  - repeat CTA on 9.2.2015: Atrophy and microvascular ischemic disease without acute intracranial process  - EEG negative for seizures. - on statin and beta blockers.  Stage III chronic kidney disease  -At a baseline.  Hypertension  -Blood pressures stable  -Continue metoprolol 50 mg by mouth twice a day   Dyslipidemia  -Continue Lipitor 80 mg by mouth daily   Procedures: EEG: Normal electroencephalogram, awake, asleep and with activation procedures.   Ct head no acute abnormality. Cardiac cath 9.1.2015: LAD stent with subsequent onset of confusion and aphasia on 8/31    Consultations:  Cardiology  neurology  Discharge Exam: Filed Vitals:   06/08/14 0845  BP: 135/64  Pulse: 71  Temp: 97.9 F (36.6 C)  Resp: 20    General: A&O x3 Cardiovascular: RRR Respiratory: good air movement CTA B/L  Discharge Instructions You were cared for by a hospitalist during your hospital stay. If you have any questions about your discharge medications or the care you received while you were in the hospital after you are discharged, you can call the unit and asked to speak with the hospitalist on call if the hospitalist that took care of you is not available. Once you are discharged, your primary care physician will handle any further medical issues. Please note that NO REFILLS for any discharge medications will  be authorized once you are discharged, as it is imperative that you return to your primary care physician (or establish a relationship with a primary care physician if you do not have one) for your aftercare needs so that they can reassess your need for  medications and monitor your lab values.  Discharge Instructions   Diet - low sodium heart healthy    Complete by:  As directed      Increase activity slowly    Complete by:  As directed           Discharge Medication List as of 06/08/2014 11:04 AM    START taking these medications   Details  clopidogrel (PLAVIX) 75 MG tablet Take 1 tablet (75 mg total) by mouth daily., Starting 06/08/2014, Until Discontinued, Print      CONTINUE these medications which have NOT CHANGED   Details  amLODipine (NORVASC) 2.5 MG tablet TAKE 1 TABLET BY MOUTH DAILY, Normal    aspirin EC 81 MG tablet Take two tablets daily, Historical Med    atorvastatin (LIPITOR) 20 MG tablet Take 20 mg by mouth daily., Until Discontinued, Historical Med    calcium carbonate (TUMS - DOSED IN MG ELEMENTAL CALCIUM) 500 MG chewable tablet Chew 2 tablets by mouth daily., Until Discontinued, Historical Med    fish oil-omega-3 fatty acids 1000 MG capsule Take 2 g by mouth 2 (two) times daily.  , Until Discontinued, Historical Med    metoprolol (LOPRESSOR) 50 MG tablet Take 50 mg by mouth 2 (two) times daily., Until Discontinued, Historical Med    Potassium Chloride (K+ POTASSIUM PO) Take 1 tablet by mouth daily. , Until Discontinued, Historical Med       Allergies  Allergen Reactions  . Lidocaine     Syncope post palmer injection; probably vasovagal as no reaction to intraarticular Lidocaine into knee   Follow-up Information   Follow up with [facility] On 06/14/2014. (9:00am with Tarri Fuller PA-C)    Specialty:  Cardiology   Contact information:   [address], [place] [address] [place] [address] [phone]      Follow up with [facility] On 06/14/2014. (Can obtain lab before or after your follow up)    Specialty:  Cardiology   Contact information:   [address], [place] [address] [phone]      Follow up with Loralie Champagne, MD On 07/31/2014.  (2:45pm)    Specialty:  Cardiology   Contact information:   [address] [place] [address] [phone]        The results of significant diagnostics from this hospitalization (including imaging, microbiology, ancillary and laboratory) are listed below for reference.    Significant Diagnostic Studies: Ct Angio Head W/cm &/or Wo Cm  06/05/2014   ADDENDUM REPORT: 06/05/2014 17:59  ADDENDUM: Examination read by Dr. Jeralyn Ruths. Results discussed with Dr. Doy Mince by myself 06/05/2014 5:42 p.m.   Electronically Signed   By: Chauncey Cruel M.D.   On: 06/05/2014 17:59   06/05/2014   CLINICAL DATA:  Aphasia.  EXAM: CT ANGIOGRAPHY HEAD AND NECK  TECHNIQUE: Multidetector CT imaging of the head and neck was performed using the standard protocol during bolus administration of intravenous contrast. Multiplanar CT image reconstructions and MIPs were obtained to evaluate the vascular anatomy. Carotid stenosis measurements (when applicable) are obtained utilizing NASCET criteria, using the distal internal carotid diameter as the denominator.  CONTRAST:  52mL OMNIPAQUE IOHEXOL 350  MG/ML SOLN  COMPARISON:  Head CT 06/05/2014  FINDINGS: CTA HEAD FINDINGS  Postcontrast head CT is without evidence of abnormal enhancement. No acute large territory infarct, intracranial hemorrhage, mass, midline shift, or extra-axial fluid collection is identified. There is mild generalized cerebral atrophy. Orbits are unremarkable. Mastoid air cells are clear. There is minimal right maxillary sinus mucosal thickening.  The intracranial vertebral arteries are patent to the basilar bilaterally. PICA origins are patent. The right AICA is dominant. SCA origins are patent. Basilar artery is patent without stenosis. PCAs are unremarkable. There is a small, patent posterior communicating artery on the right.  Internal carotid arteries are patent from skullbase to carotid termini. There is mild bilateral carotid siphon irregularity  and calcification without evidence of significant stenosis. M1 segments are patent without stenosis. There is mild to moderate left MCA superior division branch vessel irregularity and attenuation in the region of the frontal operculum. The right A1 segment appears hypoplastic. Anterior communicating artery is patent. ACAs are otherwise unremarkable.  Review of the MIP images confirms the above findings.  CTA NECK FINDINGS  Three vessel aortic arch. The proximal right common carotid artery is mildly tortuous and medialized. The common carotid arteries are otherwise unremarkable without stenosis. The right cervical internal carotid artery is mildly small in caliber diffusely compared to the left without focal stenosis, and this may be developmental given the hypoplastic right A1 segment. Vertebral arteries are patent and codominant without stenosis.  There is mild biapical scarring in the lungs. A 5 mm stone is noted anteriorly in the left submandibular gland without ductal dilatation or inflammatory change to suggest acute sialoadenitis. Moderate multilevel cervical spondylosis is noted.  Review of the MIP images confirms the above findings.  IMPRESSION: 1. No major intracranial arterial occlusion or proximal stenosis. 2. Mild to moderate left MCA superior division branch vessel irregularity and attenuation. 3. No cervical carotid or vertebral artery stenosis. The neurology service has been paged to communicate these findings.  Electronically Signed: By: Logan Bores On: 06/05/2014 17:36   Dg Chest 2 View  06/03/2014   CLINICAL DATA:  Mid right chest pain.  EXAM: CHEST  2 VIEW  COMPARISON:  02/11/2010  FINDINGS: The heart size and mediastinal contours are within normal limits. Both lungs are clear. The visualized skeletal structures are unremarkable.  IMPRESSION: No active cardiopulmonary disease.   Electronically Signed   By: Rolm Baptise M.D.   On: 06/03/2014 19:17   Ct Head Wo Contrast  06/07/2014   CLINICAL  DATA:  Post stent placement yesterday, now with memory loss. Difficulty with speech.  EXAM: CT HEAD WITHOUT CONTRAST  TECHNIQUE: Contiguous axial images were obtained from the base of the skull through the vertex without intravenous contrast.  COMPARISON:  06/05/2014 (multiple examinations)  FINDINGS: Unchanged findings of mild atrophy with diffuse sulcal prominence and prominence of the bifrontal extra-axial spaces. Scattered periventricular hypodensities compatible with microvascular ischemic disease. No CT evidence of acute large territory infarct. No intraparenchymal extra-axial mass or hemorrhage. Unchanged size and configuration of the ventricles and basilar cisterns. No midline shift. There is mild leftward deviation of the nasal bones. Paranasal sinuses and mastoid air cells are normally aerated. No air-fluid levels. Regional soft tissues appear normal. No displaced calvarial fracture.  IMPRESSION: Atrophy and microvascular ischemic disease without acute intracranial process.   Electronically Signed   By: Sandi Mariscal M.D.   On: 06/07/2014 07:40   Ct Head Wo Contrast  06/05/2014   CLINICAL DATA:  Increasingly  altered mental status  EXAM: CT HEAD WITHOUT CONTRAST  TECHNIQUE: Contiguous axial images were obtained from the base of the skull through the vertex without intravenous contrast.  COMPARISON:  06/05/2014  FINDINGS: No hemorrhage or extra-axial fluid. There is age-related cortical atrophy. No evidence of infarct or mass. No hydrocephalus. Calvarium is intact.  IMPRESSION: No acute findings   Electronically Signed   By: Skipper Cliche M.D.   On: 06/05/2014 22:02   Ct Head Wo Contrast  06/05/2014   CLINICAL DATA:  Confusion. A ectasia. Headache. Status post cardiac catheterization today.  EXAM: CT HEAD WITHOUT CONTRAST  TECHNIQUE: Contiguous axial images were obtained from the base of the skull through the vertex without intravenous contrast.  COMPARISON:  None.  FINDINGS: Ventricles are normal in  configuration. There is mild ventricular and sulcal enlargement consistent with age related volume loss. There are no parenchymal masses or mass effect. Minor periventricular white matter hypoattenuation is noted consistent with chronic microvascular ischemic change.  There is no evidence of an infarct.  No extra-axial masses or abnormal fluid collections.  There is no intracranial hemorrhage.  Sinuses and mastoid air cells are clear.  IMPRESSION: 1. No acute intracranial abnormalities. 2. Age related volume loss and minor chronic microvascular ischemic change. These results were called by telephone at the time of interpretation on 06/05/2014 at 4:08 pm to Dr. Doy Mince, who verbally acknowledged these results.   Electronically Signed   By: Lajean Manes M.D.   On: 06/05/2014 16:09   Ct Angio Neck W/cm &/or Wo/cm  06/05/2014   ADDENDUM REPORT: 06/05/2014 17:59  ADDENDUM: Examination read by Dr. Jeralyn Ruths. Results discussed with Dr. Doy Mince by myself 06/05/2014 5:42 p.m.   Electronically Signed   By: Chauncey Cruel M.D.   On: 06/05/2014 17:59   06/05/2014   CLINICAL DATA:  Aphasia.  EXAM: CT ANGIOGRAPHY HEAD AND NECK  TECHNIQUE: Multidetector CT imaging of the head and neck was performed using the standard protocol during bolus administration of intravenous contrast. Multiplanar CT image reconstructions and MIPs were obtained to evaluate the vascular anatomy. Carotid stenosis measurements (when applicable) are obtained utilizing NASCET criteria, using the distal internal carotid diameter as the denominator.  CONTRAST:  55mL OMNIPAQUE IOHEXOL 350 MG/ML SOLN  COMPARISON:  Head CT 06/05/2014  FINDINGS: CTA HEAD FINDINGS  Postcontrast head CT is without evidence of abnormal enhancement. No acute large territory infarct, intracranial hemorrhage, mass, midline shift, or extra-axial fluid collection is identified. There is mild generalized cerebral atrophy. Orbits are unremarkable. Mastoid air cells are clear. There is minimal  right maxillary sinus mucosal thickening.  The intracranial vertebral arteries are patent to the basilar bilaterally. PICA origins are patent. The right AICA is dominant. SCA origins are patent. Basilar artery is patent without stenosis. PCAs are unremarkable. There is a small, patent posterior communicating artery on the right.  Internal carotid arteries are patent from skullbase to carotid termini. There is mild bilateral carotid siphon irregularity and calcification without evidence of significant stenosis. M1 segments are patent without stenosis. There is mild to moderate left MCA superior division branch vessel irregularity and attenuation in the region of the frontal operculum. The right A1 segment appears hypoplastic. Anterior communicating artery is patent. ACAs are otherwise unremarkable.  Review of the MIP images confirms the above findings.  CTA NECK FINDINGS  Three vessel aortic arch. The proximal right common carotid artery is mildly tortuous and medialized. The common carotid arteries are otherwise unremarkable without stenosis. The right cervical internal carotid  artery is mildly small in caliber diffusely compared to the left without focal stenosis, and this may be developmental given the hypoplastic right A1 segment. Vertebral arteries are patent and codominant without stenosis.  There is mild biapical scarring in the lungs. A 5 mm stone is noted anteriorly in the left submandibular gland without ductal dilatation or inflammatory change to suggest acute sialoadenitis. Moderate multilevel cervical spondylosis is noted.  Review of the MIP images confirms the above findings.  IMPRESSION: 1. No major intracranial arterial occlusion or proximal stenosis. 2. Mild to moderate left MCA superior division branch vessel irregularity and attenuation. 3. No cervical carotid or vertebral artery stenosis. The neurology service has been paged to communicate these findings.  Electronically Signed: By: Logan Bores  On: 06/05/2014 17:36    Microbiology: No results found for this or any previous visit (from the past 240 hour(s)).   Labs: Basic Metabolic Panel: No results found for this basename: NA, K, CL, CO2, GLUCOSE, BUN, CREATININE, CALCIUM, MG, PHOS,  in the last 168 hours Liver Function Tests: No results found for this basename: AST, ALT, ALKPHOS, BILITOT, PROT, ALBUMIN,  in the last 168 hours No results found for this basename: LIPASE, AMYLASE,  in the last 168 hours No results found for this basename: AMMONIA,  in the last 168 hours CBC: No results found for this basename: WBC, NEUTROABS, HGB, HCT, MCV, PLT,  in the last 168 hours Cardiac Enzymes: No results found for this basename: CKTOTAL, CKMB, CKMBINDEX, TROPONINI,  in the last 168 hours BNP: BNP (last 3 results) No results found for this basename: PROBNP,  in the last 8760 hours CBG: No results found for this basename: GLUCAP,  in the last 168 hours     Signed:  Charlynne Cousins  [facility] 06/21/2014, 7:18 AM

## 2014-06-08 NOTE — Progress Notes (Signed)
UR Completed Yoshio Seliga Graves-Bigelow, RN,BSN [phone]  

## 2014-06-08 NOTE — Progress Notes (Signed)
Reviewed discharge instructions with patient and wife, they stated their understanding.  Reviewed stroke education with patient also.  Discharged home with wife.  [name]

## 2014-06-08 NOTE — Progress Notes (Signed)
Reviewed pt chart and agree with d/c recommendation for outpatient PT.   Jolyn Lent, PT, DPT Acute Rehabilitation Services Pager: [phone]

## 2014-06-14 ENCOUNTER — Encounter: Payer: Self-pay | Admitting: Physician Assistant

## 2014-06-14 ENCOUNTER — Other Ambulatory Visit: Payer: Medicare HMO

## 2014-06-14 ENCOUNTER — Ambulatory Visit (INDEPENDENT_AMBULATORY_CARE_PROVIDER_SITE_OTHER): Payer: Medicare HMO | Admitting: Physician Assistant

## 2014-06-14 VITALS — BP 120/70 | HR 67 | Ht 76.0 in | Wt 216.0 lb

## 2014-06-14 DIAGNOSIS — I10 Essential (primary) hypertension: Secondary | ICD-10-CM

## 2014-06-14 DIAGNOSIS — E781 Pure hyperglyceridemia: Secondary | ICD-10-CM

## 2014-06-14 DIAGNOSIS — E785 Hyperlipidemia, unspecified: Secondary | ICD-10-CM

## 2014-06-14 DIAGNOSIS — I2510 Atherosclerotic heart disease of native coronary artery without angina pectoris: Secondary | ICD-10-CM

## 2014-06-14 NOTE — Progress Notes (Signed)
Date:  06/14/2014   ID:  [name], DOB 1945-10-19, MRN [id number]  PCP:  Unice Cobble, MD  Primary Cardiologist:  Aundra Dubin     History of Present Illness: [name] is a 68 y.o. male  68 y.o. male with PMH of hypertension, dyslipidemia, CAD with NSTEMI 5/11 with DES to MRCA (and pLAD disease) who last saw Dr. Aundra Dubin 2012 who was hospitalized from August 29 to September 3 with right-sided chest pain with pressure sensation that lasted 1-2 hours after eating breakfast .  He went coronary angiography revealed three-vessel coronary artery disease with continued patency of the stented segment in the mid left circumflex, severe stenosis of the proximal LAD confirmed by pressure wire analysis, and moderate nonobstructive stenosis of the mid right coronary artery also evaluated by pressure wire analysis.  Normal LV function by echocardiography with LVEF of 55-60%.  He underwent successful PCI of the proximal LAD with a single drug-eluting stent platform.  His history also includes Hypertension; Hyperlipidemia; Anemia, mild; Vitamin B12 deficiency; Prostate cancer; Skin cancer; CAD (coronary artery disease); Renal insufficiency; echocardiogram; Melanoma; and Pulmonary embolism.  P.ost PCI the patient developed altered mental status with severe confusion and aphasia. Code stroke was called and he was evaluated by neurology.  CTA head & neck revealed no lg vessel stenosis/occlusion, mild to mod L MCA superior division branch irregularity.  It was thought that he had a TIA.  Presents today for post hospital evaluation.  He reports doing quite well.  Continues to have some bruising on his right forearm at the cath site according to him and his wife uses about 99% recovered from his TIA.  He is taking his aspirin and Plavix as directed.  He currently denies nausea, vomiting, fever, chest pain, shortness of breath, orthopnea, dizziness, PND, cough, congestion, abdominal pain, hematochezia, melena, lower  extremity edema, claudication.  Wt Readings from Last 3 Encounters:  06/14/14 216 lb (97.977 kg)  06/08/14 215 lb 1.6 oz (97.569 kg)  06/08/14 215 lb 1.6 oz (97.569 kg)     Past Medical History  Diagnosis Date  . Hypertension     did not tolerate Lisinopril event at low dose  . Hyperlipidemia   . Anemia, mild   . Vitamin B12 deficiency   . Prostate cancer   . CAD (coronary artery disease)     a. NSTEMI 5/11. b.  LHC: 60% pLAD, 70% mLAD, 95% mRCA w/ TIMI 2 flow distally, EF 60%.  Patient had PROMUS DES to [facility]. c. cath 06/05/14: LM mild irregs, pLAD 70%--> s/p PCI/DES (Promus DES), mLAD 50%, D1 75% ostial, LCx w/o significant obs, mLCx stent w/o restenosis, pRCA  30%, mRCA 50-60%  . CKD (chronic kidney disease), stage III   . Diastolic dysfunction     a. Echo 6/11: mild LVH, EF 55-60%, GR1DD, Trivial MR, mild LAE. b. echo 06/04/14: EF 55-60%, no WMA, GR1DD, Ao valve mildly calcifed, mild MR, mild LAE  . Melanoma     a. 1987. b. 2012  . Pulmonary embolism   . TIA (transient ischemic attack)     post cath and PCI on 06/05/2014    Current Outpatient Prescriptions  Medication Sig Dispense Refill  . amLODipine (NORVASC) 2.5 MG tablet TAKE 1 TABLET BY MOUTH DAILY  90 tablet  2  . aspirin EC 81 MG tablet Take two tablets daily      . atorvastatin (LIPITOR) 20 MG tablet Take 20 mg by mouth daily.      Marland Kitchen  calcium carbonate (TUMS - DOSED IN MG ELEMENTAL CALCIUM) 500 MG chewable tablet Chew 2 tablets by mouth daily.      . clopidogrel (PLAVIX) 75 MG tablet Take 1 tablet (75 mg total) by mouth daily.  30 tablet  12  . fish oil-omega-3 fatty acids 1000 MG capsule Take 2 g by mouth 2 (two) times daily.        . metoprolol (LOPRESSOR) 50 MG tablet Take 50 mg by mouth 2 (two) times daily.      . Potassium Chloride (K+ POTASSIUM PO) Take 1 tablet by mouth daily.        No current facility-administered medications for this visit.    Allergies:    Allergies  Allergen Reactions  . Lidocaine      Syncope post palmer injection; probably vasovagal as no reaction to intraarticular Lidocaine into knee    Social History:  The patient  reports that he has never smoked. He does not have any smokeless tobacco history on file. He reports that he does not drink alcohol or use illicit drugs.   Family history:   Family History  Problem Relation Age of Onset  . Coronary artery disease Mother     S/P CABG, in her 35s  . Stroke Mother 31  . Prostate cancer Father   . Hypertension Brother   . Diabetes Neg Hx     ROS:  Please see the history of present illness.  All other systems reviewed and negative.   PHYSICAL EXAM: VS:  BP 120/70  Pulse 67  Ht 6\' 4"  (1.93 m)  Wt 216 lb (97.977 kg)  BMI 26.30 kg/m2 Well nourished, well developed, in no acute distress HEENT: Pupils are equal round react to light accommodation extraocular movements are intact.  Neck: no JVDNo cervical lymphadenopathy. Cardiac: Regular rate and rhythm without murmurs rubs or gallops. Lungs:  clear to auscultation bilaterally, no wheezing, rhonchi or rales Ext: no lower extremity edema.  2+ radial and dorsalis pedis pulses. Large area of ecchymosis on the right forearm..  peers to be fading Skin: warm and dry Neuro:  Grossly normal, CN II-XII intact  EKG:  None  ASSESSMENT AND PLAN:  Problem List Items Addressed This Visit   HYPERTRIGLYCERIDEMIA, HX OF     Continue statin. The patient has not fasted we'll recheck lipid panel at next office visit    HYPERLIPIDEMIA   HYPERTENSION     Blood pressure well controlled.    CORONARY ATHEROSCLEROSIS NATIVE CORONARY ARTERY     No complaints of angina. He is on aspirin and Plavix  as well as Lopressor.     Other Visit Diagnoses   Essential hypertension, benign    -  Primary    Relevant Orders       Lipid Profile

## 2014-06-14 NOTE — Assessment & Plan Note (Signed)
No complaints of angina. He is on aspirin and Plavix  as well as Lopressor.

## 2014-06-14 NOTE — Patient Instructions (Addendum)
Your physician recommends that you return for a FASTING lipid profile: 0n October 26th   Your physician recommends that you schedule a follow-up appointment  with Dr. Burt Knack in 3 months.

## 2014-06-14 NOTE — Assessment & Plan Note (Signed)
Blood pressure well controlled

## 2014-06-14 NOTE — Assessment & Plan Note (Signed)
Continue statin. The patient has not fasted we'll recheck lipid panel at next office visit

## 2014-07-07 ENCOUNTER — Other Ambulatory Visit: Payer: Self-pay | Admitting: *Deleted

## 2014-07-07 MED ORDER — CLOPIDOGREL BISULFATE 75 MG PO TABS: 75.0000 mg | ORAL_TABLET | Freq: Every day | ORAL | Status: DC

## 2014-07-28 ENCOUNTER — Other Ambulatory Visit (INDEPENDENT_AMBULATORY_CARE_PROVIDER_SITE_OTHER): Payer: Medicare HMO | Admitting: *Deleted

## 2014-07-28 DIAGNOSIS — E781 Pure hyperglyceridemia: Secondary | ICD-10-CM

## 2014-07-28 LAB — HEPATIC FUNCTION PANEL
ALT: 20 U/L (ref 0–53)
AST: 22 U/L (ref 0–37)
Albumin: 3.6 g/dL (ref 3.5–5.2)
Alkaline Phosphatase: 57 U/L (ref 39–117)
BILIRUBIN TOTAL: 0.8 mg/dL (ref 0.2–1.2)
Bilirubin, Direct: 0 mg/dL (ref 0.0–0.3)
Total Protein: 7.1 g/dL (ref 6.0–8.3)

## 2014-07-28 LAB — LIPID PANEL
CHOLESTEROL: 125 mg/dL (ref 0–200)
HDL: 34.4 mg/dL — ABNORMAL LOW (ref >39.00)
LDL Cholesterol: 62 mg/dL (ref 0–99)
NonHDL: 90.6
Total CHOL/HDL Ratio: 4
Triglycerides: 142 mg/dL (ref 0.0–149.0)
VLDL: 28.4 mg/dL (ref 0.0–40.0)

## 2014-07-31 ENCOUNTER — Encounter: Payer: Self-pay | Admitting: Cardiology

## 2014-07-31 ENCOUNTER — Ambulatory Visit (INDEPENDENT_AMBULATORY_CARE_PROVIDER_SITE_OTHER): Payer: Medicare HMO | Admitting: Cardiology

## 2014-07-31 ENCOUNTER — Other Ambulatory Visit: Payer: Medicare HMO

## 2014-07-31 VITALS — BP 142/78 | HR 60 | Ht 76.0 in | Wt 221.0 lb

## 2014-07-31 DIAGNOSIS — G458 Other transient cerebral ischemic attacks and related syndromes: Secondary | ICD-10-CM

## 2014-07-31 DIAGNOSIS — N183 Chronic kidney disease, stage 3 unspecified: Secondary | ICD-10-CM

## 2014-07-31 DIAGNOSIS — I10 Essential (primary) hypertension: Secondary | ICD-10-CM

## 2014-07-31 DIAGNOSIS — G459 Transient cerebral ischemic attack, unspecified: Secondary | ICD-10-CM | POA: Insufficient documentation

## 2014-07-31 DIAGNOSIS — I2510 Atherosclerotic heart disease of native coronary artery without angina pectoris: Secondary | ICD-10-CM

## 2014-07-31 MED ORDER — AMLODIPINE BESYLATE 5 MG PO TABS: 5.0000 mg | ORAL_TABLET | Freq: Every day | ORAL | Status: DC

## 2014-07-31 NOTE — Progress Notes (Signed)
Patient ID: [name], male   DOB: Jan 18, 1946, 68 y.o.   MRN: [id number] PCP: Dr. Linna Darner  68 yo with history of CAD s/p NSTEMI in 5/11 and again in 8/15 presents for followup.  Patient was admitted to [facility] in 5/11 with NSTEMI.  He had a PROMUS DES to the mid LCx.  He had residual moderate disease (60-70%) in the LAD that was not intervened upon.   EF was preserved on echo.  He had recurrent NSTEMI in 8/15 with DES to LAD, RCA had moderate lesion that was not significant by FFR.  EF was again preserved on echo. He had a TIA post-cath.   Since his NSTEMI in 8/15, [name] has done well.  TIA symptoms completely resolved.  No exertional chest pain or dyspnea.  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  He is able to do everything he wants to do without exercise intolerance.    Labs (5/11): creatinine 1.56, TGs 478, HDL 35, P2Y12 testing with PRU < 230 (goal) Labs (8/11): LDL 69, HDL 35, TGs 284, creatinine 1.5, TSH normal Labs (1/12): LDL 76, HDL 37, TGs 287 Labs (10/13): creatinine 1.6, LDL 69, HDL 33 Labs (9/15): K 4.1, creatinine 1.49 Labs (10/15): HDL 34, LDL 62, TGs 142  Allergies:  1)  ! * Hydroxyzine 2)  Lidocaine  Past Medical History: 1. Hypertension: Did not tolerate lisinopril even at low dose.  2. Hyperlipidemia 3. Mild anemia, B12 deficiency,PMH of 4. Prostate cancer, hx of 5. Skin cancer, PMH  of 6. S/P  TKR 7. CAD: NSTEMI 5/11.  LHC showed 60% prox LAD, 70% mid LAD, 95% mid LCx with TIMI 2 flow distally, EF 60%.  Patient had PROMUS DES to mLCx.  NSTEMI (8/15), LHC showed 70% prox/50% mLAD, 75% ostial D1, 50-60% mRCA.  LAD stenosis significant by FFR, RCA stenosis was not.  Patient had DES to LAD.  Echo (8/15) with EF 55-60%, mild Brian.  8. CKD 9. TIA post-PCI in 8/15.   Family History: mother :CAD S/P  CABG  in 76s, CVA father: prostate cancer; bro: HTN  Social History: Never Smoked Retired from [employer], works on his farm in [place] Alcohol  use-no Regular exercise: physically active on farm Married  ROS: All systems reviewed and negative except as per HPI.   Current Outpatient Prescriptions  Medication Sig Dispense Refill  . aspirin EC 81 MG tablet Take two tablets daily      . atorvastatin (LIPITOR) 20 MG tablet Take 20 mg by mouth daily.      . calcium carbonate (TUMS - DOSED IN MG ELEMENTAL CALCIUM) 500 MG chewable tablet Chew 2 tablets by mouth daily.      . clopidogrel (PLAVIX) 75 MG tablet Take 1 tablet (75 mg total) by mouth daily.  90 tablet  0  . fish oil-omega-3 fatty acids 1000 MG capsule Take 2 g by mouth 2 (two) times daily.        . metoprolol (LOPRESSOR) 50 MG tablet Take 50 mg by mouth 2 (two) times daily.      . Potassium Chloride (K+ POTASSIUM PO) Take 1 tablet by mouth daily.       Marland Kitchen amLODipine (NORVASC) 5 MG tablet Take 1 tablet (5 mg total) by mouth daily.  30 tablet  6   No current facility-administered medications for this visit.    BP 142/78  Pulse 60  Ht 6\' 4"  (1.93 m)  Wt 221  lb (100.245 kg)  BMI 26.91 kg/m2  SpO2 97% General:  Well developed, well nourished, in no acute distress. Neck:  Neck supple, no JVD. No masses, thyromegaly or abnormal cervical nodes. Lungs:  Clear bilaterally to auscultation and percussion. Heart:  Non-displaced PMI, chest non-tender; regular rate and rhythm, S1, S2 without murmurs, rubs. Soft S4. Carotid upstroke normal, no bruit. Pedals normal pulses. No edema, no varicosities. Abdomen:  Bowel sounds positive; abdomen soft and non-tender without masses, organomegaly, or hernias noted. No hepatosplenomegaly. Extremities:  No clubbing or cyanosis. Neurologic:  Alert and oriented x 3. Psych:  Normal affect.  Assessment/Plan: 1. CAD: No ischemic symptoms.  Continue ASA 81, statin, Plavix.  Now that he has 2 stents and given no bleeding history, I will probably continue his Plavix long-term. . 2. HTN: BP is running high.  I will have him increase amlodipine to 5 mg  daily.   3. Hyperlipidemia: Good lipids in 10/15. 4. CKD: This has been stable.  5. TIA: Occurred post-PCI in 8/15.  This is another reason to consider long-term Plavix use.    Loralie Champagne 07/31/2014

## 2014-07-31 NOTE — Patient Instructions (Signed)
Increase amlodipine to 5mg  daily.   Your physician wants you to follow-up in: 6 months with Dr Aundra Dubin. (April 2016). You will receive a reminder letter in the mail two months in advance. If you don't receive a letter, please call our office to schedule the follow-up appointment.

## 2014-08-21 ENCOUNTER — Ambulatory Visit (INDEPENDENT_AMBULATORY_CARE_PROVIDER_SITE_OTHER): Payer: Medicare HMO | Admitting: Internal Medicine

## 2014-08-21 ENCOUNTER — Encounter: Payer: Self-pay | Admitting: Internal Medicine

## 2014-08-21 VITALS — BP 116/68 | HR 69 | Temp 98.6°F | Wt 219.1 lb

## 2014-08-21 DIAGNOSIS — M7662 Achilles tendinitis, left leg: Secondary | ICD-10-CM
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
## 2014-08-21 NOTE — Progress Notes (Signed)
Pre visit review using our clinic review tool, if applicable. No additional management support is needed unless otherwise documented below in the visit note. 

## 2014-08-21 NOTE — Patient Instructions (Signed)
Use an anti-inflammatory cream such as Aspercreme or Zostrix cream twice a day to the affected area as needed. Place a heel lift in both shoes. Consider glucosamine sulfate 1500 mg daily for joint symptoms. Take this daily  Until seen by Dr Tamala Julian. This will rehydrate the cartilages.

## 2014-09-14 ENCOUNTER — Encounter (HOSPITAL_COMMUNITY): Payer: Self-pay | Admitting: Cardiovascular Disease

## 2014-09-21 ENCOUNTER — Other Ambulatory Visit: Payer: Self-pay | Admitting: *Deleted

## 2014-09-21 DIAGNOSIS — I10 Essential (primary) hypertension: Secondary | ICD-10-CM

## 2014-09-21 MED ORDER — AMLODIPINE BESYLATE 5 MG PO TABS: 5.0000 mg | ORAL_TABLET | Freq: Every day | ORAL | Status: DC

## 2014-10-03 ENCOUNTER — Other Ambulatory Visit (INDEPENDENT_AMBULATORY_CARE_PROVIDER_SITE_OTHER): Payer: Medicare HMO

## 2014-10-03 ENCOUNTER — Ambulatory Visit (INDEPENDENT_AMBULATORY_CARE_PROVIDER_SITE_OTHER): Payer: Medicare HMO | Admitting: Internal Medicine

## 2014-10-03 ENCOUNTER — Encounter: Payer: Self-pay | Admitting: Internal Medicine

## 2014-10-03 VITALS — BP 126/60 | HR 61 | Temp 98.2°F | Ht 76.0 in | Wt 226.2 lb

## 2014-10-03 DIAGNOSIS — E782 Mixed hyperlipidemia: Secondary | ICD-10-CM

## 2014-10-03 DIAGNOSIS — Z8546 Personal history of malignant neoplasm of prostate: Secondary | ICD-10-CM

## 2014-10-03 DIAGNOSIS — R739 Hyperglycemia, unspecified: Secondary | ICD-10-CM

## 2014-10-03 DIAGNOSIS — I2510 Atherosclerotic heart disease of native coronary artery without angina pectoris: Secondary | ICD-10-CM

## 2014-10-03 DIAGNOSIS — D510 Vitamin B12 deficiency anemia due to intrinsic factor deficiency: Secondary | ICD-10-CM

## 2014-10-03 DIAGNOSIS — N183 Chronic kidney disease, stage 3 unspecified: Secondary | ICD-10-CM

## 2014-10-03 DIAGNOSIS — R7303 Prediabetes: Secondary | ICD-10-CM | POA: Insufficient documentation

## 2014-10-03 LAB — BASIC METABOLIC PANEL
BUN: 20 mg/dL (ref 6–23)
CALCIUM: 9.6 mg/dL (ref 8.4–10.5)
CO2: 28 mEq/L (ref 19–32)
CREATININE: 1.5 mg/dL (ref 0.4–1.5)
Chloride: 103 mEq/L (ref 96–112)
GFR: 49.49 mL/min — ABNORMAL LOW (ref >60.00)
GLUCOSE: 97 mg/dL (ref 70–99)
Potassium: 5 mEq/L (ref 3.5–5.1)
Sodium: 139 mEq/L (ref 135–145)

## 2014-10-03 LAB — CBC WITH DIFFERENTIAL/PLATELET
Basophils Absolute: 0 10*3/uL (ref 0.0–0.1)
Basophils Relative: 0.5 % (ref 0.0–3.0)
EOS ABS: 0.3 10*3/uL (ref 0.0–0.7)
Eosinophils Relative: 4.2 % (ref 0.0–5.0)
HEMATOCRIT: 41.8 % (ref 39.0–52.0)
HEMOGLOBIN: 14.1 g/dL (ref 13.0–17.0)
LYMPHS ABS: 1.6 10*3/uL (ref 0.7–4.0)
Lymphocytes Relative: 26.2 % (ref 12.0–46.0)
MCHC: 33.8 g/dL (ref 30.0–36.0)
MCV: 82.1 fl (ref 78.0–100.0)
Monocytes Absolute: 0.5 10*3/uL (ref 0.1–1.0)
Monocytes Relative: 8.8 % (ref 3.0–12.0)
NEUTROS ABS: 3.8 10*3/uL (ref 1.4–7.7)
Neutrophils Relative %: 60.3 % (ref 43.0–77.0)
Platelets: 174 10*3/uL (ref 150.0–400.0)
RBC: 5.09 Mil/uL (ref 4.22–5.81)
RDW: 14.6 % (ref 11.5–15.5)
WBC: 6.2 10*3/uL (ref 4.0–10.5)

## 2014-10-03 LAB — HEMOGLOBIN A1C: HEMOGLOBIN A1C: 5.6 % (ref 4.6–6.5)

## 2014-10-03 LAB — PSA: PSA: 0 ng/mL — ABNORMAL LOW (ref 0.10–4.00)

## 2014-10-03 LAB — VITAMIN B12: Vitamin B-12: 591 pg/mL (ref 211–911)

## 2014-10-03 LAB — TSH: TSH: 4.11 u[IU]/mL (ref 0.35–4.50)

## 2014-10-03 NOTE — Assessment & Plan Note (Signed)
BMET 

## 2014-10-03 NOTE — Assessment & Plan Note (Signed)
PSA

## 2014-10-03 NOTE — Progress Notes (Signed)
   Subjective:    Patient ID: [name], male    DOB: Jan 05, 1946, 68 y.o.   MRN: [id number]  HPI He is here to assess status of active health conditions.  PMH, FH, & Social History reviewed & updated.  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 He also does yardwork. He has no cardio pulmonary symptoms with these activities  Blood pressures range 120-130/60-low 70s.  He is no longer being followed by the Urologist; PSA checked annually. His father had prostate cancer  He is on ASA 81 mg two daily & Plavix. Other than easy bruising he denies any bleeding dyscrasias.   Family history of  MI in his mother over age 55.  Not on B12 shots now; PMH of pernicious anemia.    Review of Systems   Significant headaches, epistaxis, chest pain, palpitations, exertional dyspnea, claudication, paroxysmal nocturnal dyspnea, or edema absent. No GI symptoms , memory loss or myalgias  Epistaxis, hemoptysis, hematuria, melena, or rectal bleeding denied. No unexplained weight loss, significant dyspepsia,dysphagia, or abdominal pain.  There is no abnormal  bleeding or difficulty stopping bleeding with injury.     Objective:   Physical Exam  Pertinent or positive findings include: He has decreased hearing on the left Nasal septum is deviated to the right There is prominence of the xiphoid visibly. Second heart sound is increased. Pes planus  Present  General appearance :adequately nourished; in no distress. Eyes: No conjunctival inflammation or scleral icterus is present. Oral exam: Dental hygiene is good. Lips and gums are healthy appearing.There is no oropharyngeal erythema or exudate noted.  Heart:  Normal rate and regular rhythm. S1 normal without gallop, murmur, click, rub or other extra sounds   Lungs:Chest clear to auscultation; no wheezes, rhonchi,rales ,or rubs  present.No increased work of breathing.  Abdomen: bowel sounds normal, soft and non-tender without masses, organomegaly or hernias noted.  No guarding or rebound.  Vascular : all pulses equal ; no bruits present. Skin:Warm & dry.  Intact without suspicious lesions or rashes ; no jaundice or tenting Lymphatic: No lymphadenopathy is noted about the head, neck, axilla          Assessment & Plan:  See Current Assessment & Plan in Problem List under specific Diagnosis

## 2014-10-03 NOTE — Patient Instructions (Signed)

## 2014-10-03 NOTE — Assessment & Plan Note (Signed)
Verify need for ASA 81 mg 2 qd plus Plavix

## 2014-10-03 NOTE — Progress Notes (Signed)
Pre visit review using our clinic review tool, if applicable. No additional management support is needed unless otherwise documented below in the visit note. 

## 2014-10-03 NOTE — Assessment & Plan Note (Signed)
A1c

## 2014-10-03 NOTE — Assessment & Plan Note (Signed)
TSH 

## 2014-10-03 NOTE — Assessment & Plan Note (Signed)
CBC & dif;B12 level 

## 2014-10-20 ENCOUNTER — Other Ambulatory Visit: Payer: Self-pay | Admitting: Cardiology

## 2014-12-22 ENCOUNTER — Telehealth: Payer: Self-pay

## 2014-12-22 NOTE — Telephone Encounter (Signed)
Advised pt to call back if he still wants flu vaccine

## 2015-01-12 ENCOUNTER — Other Ambulatory Visit: Payer: Self-pay | Admitting: Cardiology

## 2015-01-15 ENCOUNTER — Telehealth: Payer: Self-pay | Admitting: Cardiology

## 2015-01-15 ENCOUNTER — Ambulatory Visit (INDEPENDENT_AMBULATORY_CARE_PROVIDER_SITE_OTHER): Payer: Medicare HMO | Admitting: Internal Medicine

## 2015-01-15 ENCOUNTER — Encounter: Payer: Self-pay | Admitting: Internal Medicine

## 2015-01-15 VITALS — BP 118/72 | HR 67 | Temp 98.8°F | Ht 76.0 in | Wt 221.8 lb

## 2015-01-15 DIAGNOSIS — I252 Old myocardial infarction: Secondary | ICD-10-CM | POA: Diagnosis not present

## 2015-01-15 DIAGNOSIS — R0789 Other chest pain: Secondary | ICD-10-CM | POA: Diagnosis not present

## 2015-01-15 DIAGNOSIS — I214 Non-ST elevation (NSTEMI) myocardial infarction: Secondary | ICD-10-CM | POA: Diagnosis not present

## 2015-01-15 DIAGNOSIS — M5416 Radiculopathy, lumbar region: Secondary | ICD-10-CM | POA: Diagnosis not present

## 2015-01-15 DIAGNOSIS — M5414 Radiculopathy, thoracic region: Secondary | ICD-10-CM

## 2015-01-15 MED ORDER — NITROGLYCERIN 0.4 MG SL SUBL: 0.4000 mg | SUBLINGUAL_TABLET | SUBLINGUAL | Status: DC | PRN

## 2015-01-15 NOTE — Progress Notes (Signed)
Pre visit review using our clinic review tool, if applicable. No additional management support is needed unless otherwise documented below in the visit note. 

## 2015-01-15 NOTE — Telephone Encounter (Signed)
New Message  Pt c/o Shortness Of Breath: STAT if SOB developed within the last 24 hours or pt is noticeably SOB on the phone  1. Are you currently SOB (can you hear that pt is SOB on the phone)? NO  2. How long have you been experiencing SOB? 2 Weeks  3. Are you SOB when sitting or when up moving around? When he is moving around. However last night when he was sitting down the burning began.  4.  Are you currently experiencing any other symptoms? Chest burning with exertion.

## 2015-01-15 NOTE — Progress Notes (Signed)
   Subjective:    Patient ID: [name], male    DOB: 1946/05/26, 69 y.o.   MRN: [id number]  HPI  In the last 2 weeks he's noted intermittent burning in his chest when he walksor 50-75 feet. This can be associated with shortness of breath. It is variable and certainly worse in cold weather. It does improve after he sits for 10-15 minutes and rests.  There is no associated nausea or diaphoresis.He denies other cardiac symptoms  Significant history includes stenting in 2005 and in September 2015. The stenting in 8413 was complicated by a stroke.  He is not monitoring his blood pressure on a regular basis. He is on a heart healthy low-salt diet.  His mother did have a myocardial infarction in her late 82s.   Review of Systems Palpitations, tachycardia, exertional dyspnea, paroxysmal nocturnal dyspnea, claudication or edema are absent.  Unexplained weight loss, abdominal pain, significant dyspepsia, dysphagia, melena, rectal bleeding, or persistently small caliber stools are denied.  He has had intermittent sharp pain in the anterior axillary line above the right hip while sitting in a recliner. Also he will have some R thigh numbness and tingling while standing and shaving @ times. No associated extremity weakness or stool /urinary incontinence.    Objective:   Physical Exam  Appears healthy and well-nourished & in no acute distress  No carotid bruits are present.No neck vein distention present at 10 - 15 degrees. Thyroid normal to palpation  Heart rhythm and rate are normal with no gallop or murmur  Chest is clear with no increased work of breathing  There is no evidence of aortic aneurysm or renal artery bruits  Abdomen soft with no organomegaly or masses. No HJR  No clubbing, cyanosis or edema present.  Pedal pulses are intact   No ischemic skin changes are present . Fingernails healthy   Alert and oriented. Strength, tone, DTRs reflexes normal        Assessment  & Plan:   #1 exertional substernal burning  #2 coronary disease, status post stenting 2. The second stenting was complicated by stroke.  #3 positional  thoracic and L2 radiculopathy suggested  EKG is normal except for interference in leads V4.  Plan: Cardiology evaluation as soon as possible. Evaluate radiculopathy symptoms after active anginal syndrome ruled out.NTG prn chest pain.

## 2015-01-15 NOTE — Telephone Encounter (Signed)
Patient describes "chest burning" at random times in mid-central chest when walking/exerting himself while outside, notably in cold air or when pollen count high. Patient has slight non-productive cough. Intermittent SOB when these episodes occur. States his chest burning resolves after he comes inside and sits in his recliner for about half an hour. Denies weight gain, swelling, HA, visual changes, any OTC medications. BP yesterday 160/78 and 138/68.  Called patient back at 11:10 am. Patient reports that he is seeing Dr. Linna Darner today at 3:15 pm. Patient placed on Dr. Claris Gladden wait list per patient request to get in before April 25th, if possible.  Patient has 4/15 appt with Dr. Aundra Dubin scheduled.

## 2015-01-16 ENCOUNTER — Encounter: Payer: Self-pay | Admitting: Nurse Practitioner

## 2015-01-16 ENCOUNTER — Ambulatory Visit (INDEPENDENT_AMBULATORY_CARE_PROVIDER_SITE_OTHER): Payer: Medicare HMO | Admitting: Nurse Practitioner

## 2015-01-16 VITALS — BP 122/82 | HR 68 | Ht 76.0 in | Wt 225.0 lb

## 2015-01-16 DIAGNOSIS — I200 Unstable angina: Secondary | ICD-10-CM | POA: Diagnosis not present

## 2015-01-16 DIAGNOSIS — I25110 Atherosclerotic heart disease of native coronary artery with unstable angina pectoris: Secondary | ICD-10-CM

## 2015-01-16 DIAGNOSIS — E785 Hyperlipidemia, unspecified: Secondary | ICD-10-CM

## 2015-01-16 DIAGNOSIS — I10 Essential (primary) hypertension: Secondary | ICD-10-CM | POA: Diagnosis not present

## 2015-01-16 DIAGNOSIS — R079 Chest pain, unspecified: Secondary | ICD-10-CM | POA: Diagnosis not present

## 2015-01-16 NOTE — Patient Instructions (Signed)
If pain recurs sit down and take Nitroglycerin as discussed. Avoid activity which has caused chest pain especially walking in the cold.

## 2015-01-16 NOTE — Progress Notes (Signed)
Patient Name: [name] Date of Encounter: 01/16/2015  Primary Care Provider:  Unice Cobble, MD Primary Cardiologist:  Einar Crow, MD   Chief Complaint  69 year old male with a history of CAD status post prior RCA and LAD stenting who presents secondary to a 2 to three-week history of progressive unstable angina.  Past Medical History   Past Medical History  Diagnosis Date  . Hypertension     did not tolerate Lisinopril event at low dose  . Hyperlipidemia   . Anemia, mild   . Vitamin B12 deficiency   . Prostate cancer     Dr Karsten Ro  . CAD (coronary artery disease)     a. NSTEMI 5/11. b.  LHC: 60% pLAD, 70% mLAD, 95% mRCA w/ TIMI 2 flow distally, EF 60%.  Patient had PROMUS DES to [facility]. c. cath 06/05/14: LM mild irregs, pLAD 70%--> s/p PCI/DES (Promus DES), mLAD 50%, D1 75% ostial, LCx w/o significant obs, mLCx stent w/o restenosis, pRCA  30%, mRCA 50-60%  . CKD (chronic kidney disease), stage III   . Diastolic dysfunction     a. Echo 6/11: mild LVH, EF 55-60%, GR1DD, Trivial MR, mild LAE. b. echo 06/04/14: EF 55-60%, no WMA, GR1DD, Ao valve mildly calcifed, mild MR, mild LAE  . Melanoma     a. 1987. b. 2012  . History of pulmonary embolism   . TIA (transient ischemic attack)     a. post cath and PCI on 06/05/2014 - no residual sequelae.   Past Surgical History  Procedure Laterality Date  . Cholecystectomy    . Prostatectomy      Dr. Karsten Ro  . Knee arthroscopy      right  . Tonsillectomy    . Mrca stent  02/2010  . Melanoma excision  2011     L chest  . Melanoma excision  1991    back  . Left heart catheterization with coronary angiogram N/A 06/05/2014    Procedure: LEFT HEART CATHETERIZATION WITH CORONARY ANGIOGRAM;  Surgeon: Blane Ohara, MD;  Location: [facility] CATH LAB;  Service: Cardiovascular;  Laterality: N/A;    Allergies  Allergies  Allergen Reactions  . Lidocaine Other (See Comments)    Syncope post palmer injection; probably vasovagal as no reaction  to intraarticular Lidocaine into knee    HPI  69 year old male with the above complex problem list. He is status post RCA stenting in 2005 with subsequent LAD stenting in August 2015. Following LAD stenting, patient had altered mental status and a code stroke was called. Head CT showed no acute findings. CTA of the head and neck showed no significant stenosis with mild to moderate left MCA superior division branch irregularity. MRI/MRA was deferred secondary to recent stent. He was seen by neurology and this event was felt to be representative of a TIA. Symptoms did resolve within a day and a half or so. Since then, he had done exceptionally well but then about 2-3 weeks ago, he began to experience intermittent exertional chest pressure and dyspnea. Over the past 2-3 weeks, this has progressed to chest burning with radiation to the bilateral arms associated with dyspnea occurring with just about any amount of activity. Symptoms have occurred daily over the past 3 days, lasting about 20 minutes, and resolving spontaneously. He had a particularly bad episode on Sunday evening though he did not require nitrates. He saw his primary care provider yesterday and given symptoms, he was referred for cardiology evaluation today. He is currently chest  pain-free but did have some dyspnea on exertion when he walked in from the parking lot. He denies PND, orthopnea, dizziness, syncope, edema, or early satiety.   Home Medications  Prior to Admission medications   Medication Sig Start Date End Date Taking? Authorizing Provider  amLODipine (NORVASC) 5 MG tablet Take 1 tablet (5 mg total) by mouth daily. 09/21/14  Yes Larey Dresser, MD  aspirin 81 MG tablet Take 162 mg by mouth daily. Patient taking by mouth two (81 mg) tablets daily   Yes Historical Provider, MD  atorvastatin (LIPITOR) 20 MG tablet TAKE 1 TABLET BY MOUTH DAILY 01/12/15  Yes Larey Dresser, MD  calcium carbonate (TUMS - DOSED IN MG ELEMENTAL CALCIUM)  500 MG chewable tablet Chew 2 tablets by mouth daily.   Yes Historical Provider, MD  clopidogrel (PLAVIX) 75 MG tablet Take 75 mg by mouth daily.   Yes Historical Provider, MD  Cyanocobalamin (VITAMIN B-12 PO) Take 1 tablet by mouth daily.   Yes Historical Provider, MD  fish oil-omega-3 fatty acids 1000 MG capsule Take 2 g by mouth 2 (two) times daily.     Yes Historical Provider, MD  metoprolol (LOPRESSOR) 50 MG tablet Take 50 mg by mouth 2 (two) times daily.   Yes Historical Provider, MD  nitroGLYCERIN (NITROSTAT) 0.4 MG SL tablet Place 1 tablet (0.4 mg total) under the tongue every 5 (five) minutes as needed for chest pain. 01/15/15  Yes Hendricks Limes, MD  Potassium Chloride (K+ POTASSIUM PO) Take 1 tablet by mouth daily.    Yes Historical Provider, MD    Family History  Family History  Problem Relation Age of Onset  . Coronary artery disease Mother     S/P CABG, in her 33s  . Transient ischemic attack Mother 24  . Prostate cancer Father   . Hypertension Brother   . Diabetes Sister     Social History  History   Social History  . Marital Status: Married    Spouse Name: N/A  . Number of Children: N/A  . Years of Education: N/A   Occupational History  . Not on file.   Social History Main Topics  . Smoking status: Never Smoker   . Smokeless tobacco: Not on file  . Alcohol Use: No  . Drug Use: No  . Sexual Activity: Not on file   Other Topics Concern  . Not on file   Social History Narrative     Review of Systems  General:  No chills, fever, night sweats or weight changes.  Cardiovascular:  Positive for chest pain and dyspnea on exertion. No edema, orthopnea, palpitations, paroxysmal nocturnal dyspnea. Dermatological: No rash, lesions/masses Respiratory: No cough, dyspnea Urologic: No hematuria, dysuria Abdominal:   No nausea, vomiting, diarrhea, bright red blood per rectum, melena, or hematemesis Neurologic:  No visual changes, wkns, changes in mental  status. All other systems reviewed and are otherwise negative except as noted above.  Physical Exam  VS:  BP 122/82 mmHg  Pulse 68  Ht 6\' 4"  (1.93 m)  Wt 225 lb (102.059 kg)  BMI 27.40 kg/m2  SpO2 96% , BMI Body mass index is 27.4 kg/(m^2). GEN: Well nourished, well developed, in no acute distress. HEENT: normal. Neck: Supple, no JVD, carotid bruits, or masses. Cardiac: RRR, no murmurs, rubs, or gallops. No clubbing, cyanosis, edema.  Radials/DP/PT 2+ and equal bilaterally.  Respiratory:  Respirations regular and unlabored, clear to auscultation bilaterally. GI: Soft, nontender, nondistended, BS + x 4. MS:  no deformity or atrophy. Skin: warm and dry, no rash. Neuro:  Strength and sensation are intact. Psych: Normal affect.  Accessory Clinical Findings  ECG - ECG from April 11 shows normal sinus rhythm without acute ST or T changes.  Assessment & Plan  1.  Unstable angina/CAD: patient presents with a 2 to three-week history of progressive exertional angina and dyspnea reminiscent of prior anginal symptoms in August 2015, at which time he had LAD stenting. At this point, symptoms have resolved with rest. We discussed options for evaluation and given progression of symptoms, cardiac catheterization is warranted at this time. He and his wife do have concerns related to prior history of TIA following catheterization however except that risk of cardiovascular event currently outweighs risk of procedure related neurologic event.  The patient understands that risks include but are not limited to stroke (1 in 1000), death (1 in 52), kidney failure [usually temporary] (1 in 500), bleeding (1 in 200), allergic reaction [possibly serious] (1 in 200), and agrees to proceed.  I discussed this case with Dr. Marlou Porch, who is in agreement that we should proceed with catheterization. We will check a CBC, basic metabolic profile, coagulation studies, and chest x-ray today. He is currently scheduled for this  Friday. He knows that if he has recurrent and recalcitrant symptoms, but he is to call EMS. He does have a prior history of stage III chronic kidney disease thus if creatinine returns markedly elevated, we may need to admit him a day early for hydration. Continue aspirin, statin, Plavix, and beta blocker therapy.  2. Hypertension: Stable on beta blocker and calcium channel blocker.  3. Hyperlipidemia: LDL was 62 in October. Continue statin therapy.  4. Stage III chronic kidney disease: Follow-up basic metabolic profile today. As above, he may require admission for hydration prior to catheterization.   Murray Hodgkins, NP 01/16/2015, 5:11 PM

## 2015-01-16 NOTE — Addendum Note (Signed)
Addended by: Rogelia Mire on: 01/16/2015 05:25 PM   Modules accepted: Orders

## 2015-01-16 NOTE — Patient Instructions (Signed)
Medication Instructions:  none  Labwork: Bmet/cbc/pt/inr/ptt  Testing/Procedures: Cardiac Catherization  Follow-Up:   Any Other Special Instructions Will Be Listed Below (If Applicable).  You are scheduled for a cardiac catheterization on Friday, April 15 with Dr. Neita Garnet or associate.  Go to [facility] 2nd Floor Short Stay on Friday, April 15 at 7:00 am.  Enter thru the [place] entrance A No food or drink after midnight on Thursday, April 14. You may take your medications with a sip of water on the day of your procedure.

## 2015-01-17 ENCOUNTER — Ambulatory Visit
Admission: RE | Admit: 2015-01-17 | Discharge: 2015-01-17 | Disposition: A | Discharge status: Home / Self Care | Payer: Medicare HMO | Source: Ambulatory Visit | Attending: Nurse Practitioner | Admitting: Nurse Practitioner

## 2015-01-17 ENCOUNTER — Other Ambulatory Visit: Payer: Self-pay | Admitting: Nurse Practitioner

## 2015-01-17 DIAGNOSIS — I200 Unstable angina: Secondary | ICD-10-CM

## 2015-01-17 LAB — CBC WITH DIFFERENTIAL/PLATELET
Basophils Absolute: 0 10*3/uL (ref 0.0–0.1)
Basophils Relative: 0.3 % (ref 0.0–3.0)
Eosinophils Absolute: 0.2 10*3/uL (ref 0.0–0.7)
Eosinophils Relative: 3.7 % (ref 0.0–5.0)
HEMATOCRIT: 40.6 % (ref 39.0–52.0)
Hemoglobin: 14 g/dL (ref 13.0–17.0)
LYMPHS ABS: 1.4 10*3/uL (ref 0.7–4.0)
Lymphocytes Relative: 25.2 % (ref 12.0–46.0)
MCHC: 34.4 g/dL (ref 30.0–36.0)
MCV: 81.5 fl (ref 78.0–100.0)
MONO ABS: 0.3 10*3/uL (ref 0.1–1.0)
Monocytes Relative: 5.3 % (ref 3.0–12.0)
NEUTROS PCT: 65.5 % (ref 43.0–77.0)
Neutro Abs: 3.7 10*3/uL (ref 1.4–7.7)
Platelets: 173 10*3/uL (ref 150.0–400.0)
RBC: 4.98 Mil/uL (ref 4.22–5.81)
RDW: 14.8 % (ref 11.5–15.5)
WBC: 5.7 10*3/uL (ref 4.0–10.5)

## 2015-01-17 LAB — BASIC METABOLIC PANEL
BUN: 25 mg/dL — ABNORMAL HIGH (ref 6–23)
CHLORIDE: 101 meq/L (ref 96–112)
CO2: 29 meq/L (ref 19–32)
Calcium: 9.9 mg/dL (ref 8.4–10.5)
Creatinine, Ser: 1.76 mg/dL — ABNORMAL HIGH (ref 0.40–1.50)
GFR: 40.81 mL/min — ABNORMAL LOW (ref >60.00)
GLUCOSE: 86 mg/dL (ref 70–99)
POTASSIUM: 4.7 meq/L (ref 3.5–5.1)
Sodium: 137 mEq/L (ref 135–145)

## 2015-01-17 LAB — PROTIME-INR
INR: 0.9 ratio (ref 0.8–1.0)
Prothrombin Time: 10.2 s (ref 9.6–13.1)

## 2015-01-17 LAB — APTT: aPTT: 26.4 s (ref 23.4–32.7)

## 2015-01-18 ENCOUNTER — Inpatient Hospital Stay (HOSPITAL_COMMUNITY)
Admission: RE | Admit: 2015-01-18 | Discharge: 2015-01-20 | DRG: 247 | Disposition: A | Discharge status: Home / Self Care | Payer: Medicare HMO | Source: Ambulatory Visit | Attending: Cardiology | Admitting: Cardiology

## 2015-01-18 ENCOUNTER — Encounter (HOSPITAL_COMMUNITY): Payer: Self-pay | Admitting: General Practice

## 2015-01-18 DIAGNOSIS — Z79899 Other long term (current) drug therapy: Secondary | ICD-10-CM

## 2015-01-18 DIAGNOSIS — Z85820 Personal history of malignant melanoma of skin: Secondary | ICD-10-CM

## 2015-01-18 DIAGNOSIS — E782 Mixed hyperlipidemia: Secondary | ICD-10-CM | POA: Diagnosis present

## 2015-01-18 DIAGNOSIS — N183 Chronic kidney disease, stage 3 unspecified: Secondary | ICD-10-CM | POA: Diagnosis present

## 2015-01-18 DIAGNOSIS — R079 Chest pain, unspecified: Secondary | ICD-10-CM | POA: Diagnosis not present

## 2015-01-18 DIAGNOSIS — Z8249 Family history of ischemic heart disease and other diseases of the circulatory system: Secondary | ICD-10-CM | POA: Diagnosis not present

## 2015-01-18 DIAGNOSIS — I129 Hypertensive chronic kidney disease with stage 1 through stage 4 chronic kidney disease, or unspecified chronic kidney disease: Secondary | ICD-10-CM | POA: Diagnosis present

## 2015-01-18 DIAGNOSIS — Z8673 Personal history of transient ischemic attack (TIA), and cerebral infarction without residual deficits: Secondary | ICD-10-CM

## 2015-01-18 DIAGNOSIS — Z8546 Personal history of malignant neoplasm of prostate: Secondary | ICD-10-CM

## 2015-01-18 DIAGNOSIS — Z86711 Personal history of pulmonary embolism: Secondary | ICD-10-CM | POA: Diagnosis not present

## 2015-01-18 DIAGNOSIS — I252 Old myocardial infarction: Secondary | ICD-10-CM | POA: Diagnosis not present

## 2015-01-18 DIAGNOSIS — I25110 Atherosclerotic heart disease of native coronary artery with unstable angina pectoris: Secondary | ICD-10-CM | POA: Diagnosis present

## 2015-01-18 DIAGNOSIS — Z955 Presence of coronary angioplasty implant and graft: Secondary | ICD-10-CM | POA: Diagnosis not present

## 2015-01-18 DIAGNOSIS — E785 Hyperlipidemia, unspecified: Secondary | ICD-10-CM | POA: Diagnosis present

## 2015-01-18 DIAGNOSIS — I10 Essential (primary) hypertension: Secondary | ICD-10-CM

## 2015-01-18 DIAGNOSIS — Z7982 Long term (current) use of aspirin: Secondary | ICD-10-CM

## 2015-01-18 DIAGNOSIS — Z8042 Family history of malignant neoplasm of prostate: Secondary | ICD-10-CM | POA: Diagnosis not present

## 2015-01-18 DIAGNOSIS — I200 Unstable angina: Secondary | ICD-10-CM | POA: Diagnosis present

## 2015-01-18 HISTORY — DX: Acute myocardial infarction, unspecified: I21.9

## 2015-01-18 LAB — CBC
HCT: 40.4 % (ref 39.0–52.0)
Hemoglobin: 13.6 g/dL (ref 13.0–17.0)
MCH: 27.6 pg (ref 26.0–34.0)
MCHC: 33.7 g/dL (ref 30.0–36.0)
MCV: 81.9 fL (ref 78.0–100.0)
Platelets: 150 10*3/uL (ref 150–400)
RBC: 4.93 MIL/uL (ref 4.22–5.81)
RDW: 13.8 % (ref 11.5–15.5)
WBC: 5.6 10*3/uL (ref 4.0–10.5)

## 2015-01-18 LAB — CREATININE, SERUM
Creatinine, Ser: 1.75 mg/dL — ABNORMAL HIGH (ref 0.50–1.35)
GFR calc Af Amer: 44 mL/min — ABNORMAL LOW (ref >90)
GFR calc non Af Amer: 38 mL/min — ABNORMAL LOW (ref >90)

## 2015-01-18 MED ORDER — AMLODIPINE BESYLATE 5 MG PO TABS
5.0000 mg | ORAL_TABLET | Freq: Every day | ORAL | Status: DC
  Administered 2015-01-19 – 2015-01-20 (×2): 5 mg via ORAL
  Filled 2015-01-18 (×2): qty 1

## 2015-01-18 MED ORDER — METOPROLOL TARTRATE 25 MG PO TABS
50.0000 mg | ORAL_TABLET | Freq: Two times a day (BID) | ORAL | Status: DC
  Administered 2015-01-18 – 2015-01-20 (×4): 50 mg via ORAL
  Filled 2015-01-18: qty 1
  Filled 2015-01-18: qty 2
  Filled 2015-01-18: qty 1
  Filled 2015-01-18 (×2): qty 2

## 2015-01-18 MED ORDER — CALCIUM CARBONATE ANTACID 500 MG PO CHEW
2.0000 | CHEWABLE_TABLET | Freq: Every day | ORAL | Status: DC
  Administered 2015-01-19 – 2015-01-20 (×2): 400 mg via ORAL
  Filled 2015-01-18 (×2): qty 2

## 2015-01-18 MED ORDER — ALPRAZOLAM 0.25 MG PO TABS
0.2500 mg | ORAL_TABLET | Freq: Two times a day (BID) | ORAL | Status: DC | PRN
  Administered 2015-01-19: 0.25 mg via ORAL
  Filled 2015-01-18: qty 1

## 2015-01-18 MED ORDER — ATORVASTATIN CALCIUM 20 MG PO TABS
20.0000 mg | ORAL_TABLET | Freq: Every day | ORAL | Status: DC
  Administered 2015-01-18 – 2015-01-19 (×2): 20 mg via ORAL
  Filled 2015-01-18 (×3): qty 1

## 2015-01-18 MED ORDER — ZOLPIDEM TARTRATE 5 MG PO TABS: 5.0000 mg | ORAL_TABLET | Freq: Every evening | ORAL | Status: DC | PRN

## 2015-01-18 MED ORDER — CLOPIDOGREL BISULFATE 75 MG PO TABS
75.0000 mg | ORAL_TABLET | Freq: Every day | ORAL | Status: DC
  Administered 2015-01-19 – 2015-01-20 (×2): 75 mg via ORAL
  Filled 2015-01-18 (×2): qty 1

## 2015-01-18 MED ORDER — ONDANSETRON HCL 4 MG/2ML IJ SOLN: 4.0000 mg | Freq: Four times a day (QID) | INTRAMUSCULAR | Status: DC | PRN

## 2015-01-18 MED ORDER — NITROGLYCERIN 0.4 MG SL SUBL
0.4000 mg | SUBLINGUAL_TABLET | SUBLINGUAL | Status: DC | PRN
  Administered 2015-01-19 (×2): 0.4 mg via SUBLINGUAL
  Filled 2015-01-18: qty 1

## 2015-01-18 MED ORDER — SODIUM CHLORIDE 0.9 % IV SOLN
INTRAVENOUS | Status: DC
  Administered 2015-01-18: 22:00:00 via INTRAVENOUS

## 2015-01-18 MED ORDER — ASPIRIN EC 81 MG PO TBEC
162.0000 mg | DELAYED_RELEASE_TABLET | Freq: Every day | ORAL | Status: DC
  Administered 2015-01-19: 162 mg via ORAL
  Filled 2015-01-18 (×3): qty 2

## 2015-01-18 MED ORDER — ACETAMINOPHEN 325 MG PO TABS
650.0000 mg | ORAL_TABLET | ORAL | Status: DC | PRN
  Administered 2015-01-19: 20:00:00 650 mg via ORAL
  Filled 2015-01-18: qty 2

## 2015-01-18 MED ORDER — HEPARIN SODIUM (PORCINE) 5000 UNIT/ML IJ SOLN
5000.0000 [IU] | Freq: Three times a day (TID) | INTRAMUSCULAR | Status: DC
  Administered 2015-01-18 – 2015-01-19 (×2): 5000 [IU] via SUBCUTANEOUS
  Filled 2015-01-18 (×2): qty 1

## 2015-01-18 NOTE — H&P (Signed)
H&P- see office note 01/16/15, pt admitted for hydration, cath in am. Kerin Ransom PA-C 01/18/2015 1:53 PM     Patient Name: [name] Date of Encounter: 01/16/2015  Primary Care Provider: Unice Cobble, MD Primary Cardiologist: Einar Crow, MD   Chief Complaint  69 year old male with a history of CAD status post prior RCA and LAD stenting who presents secondary to a 2 to three-week history of progressive unstable angina.  Past Medical History  Past Medical History  Diagnosis Date  . Hypertension     did not tolerate Lisinopril event at low dose  . Hyperlipidemia   . Anemia, mild   . Vitamin B12 deficiency   . Prostate cancer     Dr Karsten Ro  . CAD (coronary artery disease)     a. NSTEMI 5/11. b. LHC: 60% pLAD, 70% mLAD, 95% mRCA w/ TIMI 2 flow distally, EF 60%. Patient had PROMUS DES to [facility]. c. cath 06/05/14: LM mild irregs, pLAD 70%--> s/p PCI/DES (Promus DES), mLAD 50%, D1 75% ostial, LCx w/o significant obs, mLCx stent w/o restenosis, pRCA 30%, mRCA 50-60%  . CKD (chronic kidney disease), stage III   . Diastolic dysfunction     a. Echo 6/11: mild LVH, EF 55-60%, GR1DD, Trivial MR, mild LAE. b. echo 06/04/14: EF 55-60%, no WMA, GR1DD, Ao valve mildly calcifed, mild MR, mild LAE  . Melanoma     a. 1987. b. 2012  . History of pulmonary embolism   . TIA (transient ischemic attack)     a. post cath and PCI on 06/05/2014 - no residual sequelae.   Past Surgical History  Procedure Laterality Date  . Cholecystectomy    . Prostatectomy      Dr. Karsten Ro  . Knee arthroscopy      right  . Tonsillectomy    . Mrca stent  02/2010  . Melanoma excision  2011     L chest  . Melanoma excision  1991    back  . Left heart catheterization with coronary angiogram N/A 06/05/2014    Procedure: LEFT HEART CATHETERIZATION WITH CORONARY ANGIOGRAM; Surgeon: Blane Ohara, MD;  Location: [facility] CATH LAB; Service: Cardiovascular; Laterality: N/A;    Allergies  Allergies  Allergen Reactions  . Lidocaine Other (See Comments)    Syncope post palmer injection; probably vasovagal as no reaction to intraarticular Lidocaine into knee    HPI  69 year old male with the above complex problem list. He is status post RCA stenting in 2005 with subsequent LAD stenting in August 2015. Following LAD stenting, patient had altered mental status and a code stroke was called. Head CT showed no acute findings. CTA of the head and neck showed no significant stenosis with mild to moderate left MCA superior division branch irregularity. MRI/MRA was deferred secondary to recent stent. He was seen by neurology and this event was felt to be representative of a TIA. Symptoms did resolve within a day and a half or so. Since then, he had done exceptionally well but then about 2-3 weeks ago, he began to experience intermittent exertional chest pressure and dyspnea. Over the past 2-3 weeks, this has progressed to chest burning with radiation to the bilateral arms associated with dyspnea occurring with just about any amount of activity. Symptoms have occurred daily over the past 3 days, lasting about 20 minutes, and resolving spontaneously. He had a particularly bad episode on Sunday evening though he did not require nitrates. He saw his primary care provider yesterday and given symptoms,  he was referred for cardiology evaluation today. He is currently chest pain-free but did have some dyspnea on exertion when he walked in from the parking lot. He denies PND, orthopnea, dizziness, syncope, edema, or early satiety.   Home Medications  Prior to Admission medications   Medication Sig Start Date End Date Taking? Authorizing Provider  amLODipine (NORVASC) 5 MG tablet Take 1 tablet (5 mg total) by mouth daily. 09/21/14  Yes Larey Dresser, MD  aspirin 81 MG tablet Take 162 mg by  mouth daily. Patient taking by mouth two (81 mg) tablets daily   Yes Historical Provider, MD  atorvastatin (LIPITOR) 20 MG tablet TAKE 1 TABLET BY MOUTH DAILY 01/12/15  Yes Larey Dresser, MD  calcium carbonate (TUMS - DOSED IN MG ELEMENTAL CALCIUM) 500 MG chewable tablet Chew 2 tablets by mouth daily.   Yes Historical Provider, MD  clopidogrel (PLAVIX) 75 MG tablet Take 75 mg by mouth daily.   Yes Historical Provider, MD  Cyanocobalamin (VITAMIN B-12 PO) Take 1 tablet by mouth daily.   Yes Historical Provider, MD  fish oil-omega-3 fatty acids 1000 MG capsule Take 2 g by mouth 2 (two) times daily.    Yes Historical Provider, MD  metoprolol (LOPRESSOR) 50 MG tablet Take 50 mg by mouth 2 (two) times daily.   Yes Historical Provider, MD  nitroGLYCERIN (NITROSTAT) 0.4 MG SL tablet Place 1 tablet (0.4 mg total) under the tongue every 5 (five) minutes as needed for chest pain. 01/15/15  Yes Hendricks Limes, MD  Potassium Chloride (K+ POTASSIUM PO) Take 1 tablet by mouth daily.    Yes Historical Provider, MD    Family History  Family History  Problem Relation Age of Onset  . Coronary artery disease Mother     S/P CABG, in her 16s  . Transient ischemic attack Mother 38  . Prostate cancer Father   . Hypertension Brother   . Diabetes Sister     Social History  History   Social History  . Marital Status: Married    Spouse Name: N/A  . Number of Children: N/A  . Years of Education: N/A   Occupational History  . Not on file.   Social History Main Topics  . Smoking status: Never Smoker   . Smokeless tobacco: Not on file  . Alcohol Use: No  . Drug Use: No  . Sexual Activity: Not on file   Other Topics Concern  . Not on file   Social History Narrative     Review of Systems  General: No chills, fever, night sweats or weight changes.   Cardiovascular: Positive for chest pain and dyspnea on exertion. No edema, orthopnea, palpitations, paroxysmal nocturnal dyspnea. Dermatological: No rash, lesions/masses Respiratory: No cough, dyspnea Urologic: No hematuria, dysuria Abdominal: No nausea, vomiting, diarrhea, bright red blood per rectum, melena, or hematemesis Neurologic: No visual changes, wkns, changes in mental status. All other systems reviewed and are otherwise negative except as noted above.  Physical Exam  VS: BP 122/82 mmHg  Pulse 68  Ht 6\' 4"  (1.93 m)  Wt 225 lb (102.059 kg)  BMI 27.40 kg/m2  SpO2 96% , BMI Body mass index is 27.4 kg/(m^2). GEN: Well nourished, well developed, in no acute distress.  HEENT: normal.  Neck: Supple, no JVD, carotid bruits, or masses. Cardiac: RRR, no murmurs, rubs, or gallops. No clubbing, cyanosis, edema. Radials/DP/PT 2+ and equal bilaterally.  Respiratory: Respirations regular and unlabored, clear to auscultation bilaterally. GI: Soft, nontender, nondistended, BS +  x 4. MS: no deformity or atrophy. Skin: warm and dry, no rash. Neuro: Strength and sensation are intact. Psych: Normal affect.  Accessory Clinical Findings  ECG - ECG from April 11 shows normal sinus rhythm without acute ST or T changes.  Assessment & Plan  1. Unstable angina/CAD: patient presents with a 2 to three-week history of progressive exertional angina and dyspnea reminiscent of prior anginal symptoms in August 2015, at which time he had LAD stenting. At this point, symptoms have resolved with rest. We discussed options for evaluation and given progression of symptoms, cardiac catheterization is warranted at this time. He and his wife do have concerns related to prior history of TIA following catheterization however except that risk of cardiovascular event currently outweighs risk of procedure related neurologic event. The patient understands that risks include but are not limited to stroke (1  in 1000), death (1 in 65), kidney failure [usually temporary] (1 in 500), bleeding (1 in 200), allergic reaction [possibly serious] (1 in 200), and agrees to proceed. I discussed this case with Dr. Marlou Porch, who is in agreement that we should proceed with catheterization. We will check a CBC, basic metabolic profile, coagulation studies, and chest x-ray today. He is currently scheduled for this Friday. He knows that if he has recurrent and recalcitrant symptoms, but he is to call EMS. He does have a prior history of stage III chronic kidney disease thus if creatinine returns markedly elevated, we may need to admit him a day early for hydration. Continue aspirin, statin, Plavix, and beta blocker therapy.  2. Hypertension: Stable on beta blocker and calcium channel blocker.  3. Hyperlipidemia: LDL was 62 in October. Continue statin therapy.  4. Stage III chronic kidney disease: Follow-up basic metabolic profile today. As above, he may require admission for hydration prior to catheterization.   Murray Hodgkins, NP 01/16/2015, 5:11 PM      History and all data above reviewed.  Patient examined.  I agree with the findings as above.  Patient interviewed and examined.  New onset severe exertional chest pain.  (Class IV).  CKD  The patient exam reveals COR:  RRR  ,  Lungs: Clear  ,  Abd: Positive bowel sounds, no rebound no guarding, Ext No edema  .  All available labs, radiology testing, previous records reviewed. Agree with documented assessment and plan. New onset severe exertional chest pain.  Cath.  Hydration secondary to CKD.  Of note I did reivew the old records and the patient had AMS after the last cath.  It was not clear what this was although CTs of the head were non acute and the mental status eventually cleared.     Jeneen Rinks Jasmynn Pfalzgraf  3:09 PM  01/18/2015

## 2015-01-19 ENCOUNTER — Encounter (HOSPITAL_COMMUNITY)
Admission: RE | Disposition: A | Discharge status: Home / Self Care | Payer: Medicare HMO | Source: Ambulatory Visit | Attending: Cardiology

## 2015-01-19 ENCOUNTER — Encounter (HOSPITAL_COMMUNITY): Payer: Self-pay | Admitting: Cardiology

## 2015-01-19 ENCOUNTER — Ambulatory Visit (HOSPITAL_COMMUNITY): Admission: RE | Admit: 2015-01-19 | Payer: Medicare HMO | Source: Ambulatory Visit | Admitting: Cardiology

## 2015-01-19 ENCOUNTER — Other Ambulatory Visit: Payer: Self-pay

## 2015-01-19 DIAGNOSIS — I200 Unstable angina: Secondary | ICD-10-CM | POA: Diagnosis present

## 2015-01-19 DIAGNOSIS — I25110 Atherosclerotic heart disease of native coronary artery with unstable angina pectoris: Principal | ICD-10-CM

## 2015-01-19 HISTORY — PX: LEFT HEART CATHETERIZATION WITH CORONARY ANGIOGRAM: SHX5451

## 2015-01-19 LAB — CBC
HCT: 37.7 % — ABNORMAL LOW (ref 39.0–52.0)
Hemoglobin: 12.6 g/dL — ABNORMAL LOW (ref 13.0–17.0)
MCH: 27.5 pg (ref 26.0–34.0)
MCHC: 33.4 g/dL (ref 30.0–36.0)
MCV: 82.3 fL (ref 78.0–100.0)
Platelets: 133 10*3/uL — ABNORMAL LOW (ref 150–400)
RBC: 4.58 MIL/uL (ref 4.22–5.81)
RDW: 14.1 % (ref 11.5–15.5)
WBC: 4.5 10*3/uL (ref 4.0–10.5)

## 2015-01-19 LAB — BASIC METABOLIC PANEL
Anion gap: 9 (ref 5–15)
BUN: 21 mg/dL (ref 6–23)
CO2: 27 mmol/L (ref 19–32)
Calcium: 9 mg/dL (ref 8.4–10.5)
Chloride: 102 mmol/L (ref 96–112)
Creatinine, Ser: 1.61 mg/dL — ABNORMAL HIGH (ref 0.50–1.35)
GFR calc Af Amer: 48 mL/min — ABNORMAL LOW (ref >90)
GFR calc non Af Amer: 42 mL/min — ABNORMAL LOW (ref >90)
Glucose, Bld: 98 mg/dL (ref 70–99)
Potassium: 4.2 mmol/L (ref 3.5–5.1)
Sodium: 138 mmol/L (ref 135–145)

## 2015-01-19 SURGERY — LEFT HEART CATHETERIZATION WITH CORONARY ANGIOGRAM
Anesthesia: LOCAL

## 2015-01-19 MED ORDER — GI COCKTAIL ~~LOC~~
30.0000 mL | Freq: Once | ORAL | Status: AC
  Administered 2015-01-19: 13:00:00 30 mL via ORAL
  Filled 2015-01-19: qty 30

## 2015-01-19 MED ORDER — CLOPIDOGREL BISULFATE 75 MG PO TABS
ORAL_TABLET | ORAL | Status: AC
  Filled 2015-01-19: qty 1

## 2015-01-19 MED ORDER — ASPIRIN 81 MG PO CHEW
81.0000 mg | CHEWABLE_TABLET | Freq: Every day | ORAL | Status: DC
  Administered 2015-01-20: 09:00:00 81 mg via ORAL
  Filled 2015-01-19: qty 1

## 2015-01-19 MED ORDER — CLOPIDOGREL BISULFATE 75 MG PO TABS: 75.0000 mg | ORAL_TABLET | Freq: Every day | ORAL | Status: DC

## 2015-01-19 MED ORDER — FENTANYL CITRATE (PF) 100 MCG/2ML IJ SOLN
INTRAMUSCULAR | Status: AC
  Filled 2015-01-19: qty 2

## 2015-01-19 MED ORDER — LORAZEPAM 2 MG/ML IJ SOLN
1.0000 mg | Freq: Once | INTRAMUSCULAR | Status: AC
  Administered 2015-01-19: 1 mg via INTRAVENOUS

## 2015-01-19 MED ORDER — LORAZEPAM 2 MG/ML IJ SOLN
INTRAMUSCULAR | Status: AC
  Filled 2015-01-19: qty 1

## 2015-01-19 MED ORDER — ANGIOPLASTY BOOK
Freq: Once | Status: AC
  Administered 2015-01-19: 21:00:00
  Filled 2015-01-19: qty 1

## 2015-01-19 MED ORDER — NITROGLYCERIN 1 MG/10 ML FOR IR/CATH LAB
INTRA_ARTERIAL | Status: AC
  Filled 2015-01-19: qty 10

## 2015-01-19 MED ORDER — SODIUM CHLORIDE 0.9 % IV SOLN
1.0000 mL/kg/h | INTRAVENOUS | Status: DC
  Administered 2015-01-19: 12:00:00 1 mL/kg/h via INTRAVENOUS

## 2015-01-19 MED ORDER — BIVALIRUDIN 250 MG IV SOLR
INTRAVENOUS | Status: AC
  Filled 2015-01-19: qty 250

## 2015-01-19 MED ORDER — MORPHINE SULFATE 2 MG/ML IJ SOLN
INTRAMUSCULAR | Status: AC
Start: 2015-01-19 — End: 2015-01-19
  Filled 2015-01-19: qty 1

## 2015-01-19 MED ORDER — LIDOCAINE HCL (PF) 1 % IJ SOLN
INTRAMUSCULAR | Status: AC
  Filled 2015-01-19: qty 30

## 2015-01-19 MED ORDER — ALPRAZOLAM 0.25 MG PO TABS: 0.2500 mg | ORAL_TABLET | Freq: Three times a day (TID) | ORAL | Status: DC | PRN

## 2015-01-19 MED ORDER — MORPHINE SULFATE 2 MG/ML IJ SOLN
2.0000 mg | INTRAMUSCULAR | Status: DC | PRN
  Administered 2015-01-19 (×2): 2 mg via INTRAVENOUS
  Filled 2015-01-19: qty 1

## 2015-01-19 MED ORDER — HEPARIN (PORCINE) IN NACL 2-0.9 UNIT/ML-% IJ SOLN
INTRAMUSCULAR | Status: AC
  Filled 2015-01-19: qty 1000

## 2015-01-19 MED ORDER — VERAPAMIL HCL 2.5 MG/ML IV SOLN
INTRAVENOUS | Status: AC
  Filled 2015-01-19: qty 2

## 2015-01-19 MED ORDER — HEPARIN SODIUM (PORCINE) 1000 UNIT/ML IJ SOLN
INTRAMUSCULAR | Status: AC
  Filled 2015-01-19: qty 1

## 2015-01-19 NOTE — Progress Notes (Signed)
Called to see by RN secondary to chest pain. EKG shows no acute changes. Discussed with Dr Martinique. Pt had single vessel CAD and it was a large RCA. If that had occluded he would have EKG changes. Will Rx for other causes.  Kerin Ransom PA-C 01/19/2015 12:08 PM

## 2015-01-19 NOTE — Interval H&P Note (Signed)
History and Physical Interval Note:  01/19/2015 8:00 AM  [name]  has presented today for surgery, with the diagnosis of unstable angina  The various methods of treatment have been discussed with the patient and family. After consideration of risks, benefits and other options for treatment, the patient has consented to  Procedure(s): LEFT HEART CATHETERIZATION WITH CORONARY ANGIOGRAM (N/A) as a surgical intervention .  The patient's history has been reviewed, patient examined, no change in status, stable for surgery.  I have reviewed the patient's chart and labs.  Questions were answered to the patient's satisfaction.   Cath Lab Visit (complete for each Cath Lab visit)  Clinical Evaluation Leading to the Procedure:   ACS: Yes.    Non-ACS:    Anginal Classification: CCS III  Anti-ischemic medical therapy: Maximal Therapy (2 or more classes of medications)  Non-Invasive Test Results: No non-invasive testing performed  Prior CABG: No previous CABG        Collier Salina [facility] 01/19/2015 8:00 AM\

## 2015-01-19 NOTE — CV Procedure (Signed)
    Cardiac Catheterization Procedure Note  Name: [name] MRN: [id number] DOB: 07-26-1946  Procedure: Left Heart Cath, Selective Coronary Angiography, LV angiography, PTCA and stenting of the RCA  Indication: 69 yo WM with history of CAD s/p stenting of the LAD and LCx in the past presents with USAP.  Procedural Details:  The right wrist was prepped, draped, and anesthetized with 1% lidocaine. Using the modified Seldinger technique, a 6 French slender sheath was introduced into the right radial artery. 3 mg of verapamil was administered through the sheath, weight-based unfractionated heparin was administered intravenously. Standard Judkins catheters were used for selective coronary angiography and left ventriculography. Catheter exchanges were performed over an exchange length guidewire.  PROCEDURAL FINDINGS Hemodynamics: AO 157/72 mean 109 mm Hg LV 154/24 mm Hg   Coronary angiography: Coronary dominance: right  Left mainstem: Normal  Left anterior descending (LAD): The stent in the proximal LAD is widely patent. There is a 50% stenosis in the mid vessel.   Left circumflex (LCx): There is a stent in the proximal LCx and it is widely patent. No obstructive disease.  Right coronary artery (RCA): The RCA is a large dominant vessel. It is tortuous. There is diffuse 30% disease in the proximal vessel. There is a focal 90% stenosis in the mid vessel.   Left ventriculography: Not done due to CKD.   PCI Note:  Following the diagnostic procedure, the decision was made to proceed with PCI of the RCA.  Weight-based bivalirudin was given for anticoagulation. Once a therapeutic ACT was achieved, a 6 [place] FR4 guide catheter was inserted.  A prowater coronary guidewire was used to cross the lesion.  The lesion was predilated with a 2.5 mm balloon. At this point we were unable to cross the lesion with a stent due to tortuosity of the proximal vessel and calcification. A Guideliner catheter was  advanced into the mid RCA over a balloon.   The lesion was then stented with a 3.5 x 20 mm Synergy stent.  The stent was postdilated with a 4.0 mm noncompliant balloon.  Following PCI, there was 0% residual stenosis and TIMI-3 flow. Final angiography confirmed an excellent result. The patient tolerated the procedure well. There were no immediate procedural complications. A TR band was used for radial hemostasis. The patient was transferred to the post catheterization recovery area for further monitoring.  PCI Data: Vessel - RCA/Segment - mid  Percent Stenosis (pre)  90%  TIMI-flow 3 Stent 3.5 x 20 mm Synergy post dilated to 4.0.  Percent Stenosis (post) 0% TIMI-flow (post) 3  Final Conclusions:   1. Single vessel obstructive CAD. Stents in the proximal LAD and LCx are still patent. Progressive stenosis in the mid RCA. 2. Successful Stenting of the mid RCA with a DES.     Recommendations:  Continue DAPT for one year. Anticipate DC tomorrow.   Peter Martinique, [place] 01/19/2015, 8:54 AM

## 2015-01-19 NOTE — Progress Notes (Signed)
TR BAND REMOVAL  LOCATION:    right radial  DEFLATED PER PROTOCOL:    Yes.    TIME BAND OFF / DRESSING APPLIED:    1300   SITE UPON ARRIVAL:    Level 0  SITE AFTER BAND REMOVAL:    Level 0  REVERSE ALLEN'S TEST:     positive  CIRCULATION SENSATION AND MOVEMENT:    Within Normal Limits   Yes.    COMMENTS:   Tolerated procedure well

## 2015-01-19 NOTE — Progress Notes (Signed)
Pt continously  C/o increasing  Chest,t administered  SL NTG x2  With no relief , claimed with slight relief from morphine iv, stat Ekg done,no changes noted, vital signs stable, Kerin Ransom seen and examined pt ,new orders carried out. Monitored pt closely, family at bedside , emotional support provided.

## 2015-01-20 ENCOUNTER — Encounter (HOSPITAL_COMMUNITY): Payer: Self-pay | Admitting: Cardiology

## 2015-01-20 DIAGNOSIS — Z955 Presence of coronary angioplasty implant and graft: Secondary | ICD-10-CM

## 2015-01-20 LAB — CBC
HCT: 38.2 % — ABNORMAL LOW (ref 39.0–52.0)
HEMOGLOBIN: 12.8 g/dL — AB (ref 13.0–17.0)
MCH: 27.8 pg (ref 26.0–34.0)
MCHC: 33.5 g/dL (ref 30.0–36.0)
MCV: 82.9 fL (ref 78.0–100.0)
PLATELETS: 125 10*3/uL — AB (ref 150–400)
RBC: 4.61 MIL/uL (ref 4.22–5.81)
RDW: 14 % (ref 11.5–15.5)
WBC: 4.7 10*3/uL (ref 4.0–10.5)

## 2015-01-20 LAB — BASIC METABOLIC PANEL
Anion gap: 8 (ref 5–15)
BUN: 18 mg/dL (ref 6–23)
CALCIUM: 8.9 mg/dL (ref 8.4–10.5)
CO2: 26 mmol/L (ref 19–32)
Chloride: 102 mmol/L (ref 96–112)
Creatinine, Ser: 1.42 mg/dL — ABNORMAL HIGH (ref 0.50–1.35)
GFR calc Af Amer: 56 mL/min — ABNORMAL LOW (ref >90)
GFR, EST NON AFRICAN AMERICAN: 49 mL/min — AB (ref >90)
GLUCOSE: 85 mg/dL (ref 70–99)
Potassium: 4.1 mmol/L (ref 3.5–5.1)
SODIUM: 136 mmol/L (ref 135–145)

## 2015-01-20 MED ORDER — AMLODIPINE BESYLATE 5 MG PO TABS: 5.0000 mg | ORAL_TABLET | Freq: Every day | ORAL | Status: DC

## 2015-01-20 MED ORDER — ATORVASTATIN CALCIUM 20 MG PO TABS: ORAL_TABLET | ORAL | Status: DC

## 2015-01-20 NOTE — Discharge Summary (Signed)
Physician Discharge Summary       Patient ID: [name] MRN: [id number] DOB/AGE: August 12, 1946 69 y.o.  Admit date: 01/18/2015 Discharge date: 01/20/2015 Primary Cardiologist:Dr. Aundra Dubin   Discharge Diagnoses:  Principal Problem:   Unstable angina Active Problems:   S/P coronary artery stent placement, 01/19/15 S/p DES of mid RCA   Mixed hyperlipidemia   Essential hypertension   Chronic kidney disease (CKD), stage III (moderate)   Discharged Condition: good  Procedures: cardiac cath and PCI of RCA  01/19/15 by Dr. Martinique  Hospital Course: 69 year old male with a history of CAD status post prior LCX and LAD stenting who presented secondary to a 2 to three-week history of progressive unstable angina.  He was admitted and had cardiac cath the next day with progressive CAD. Previous stents in LCX and LAD patent.  New 90% stenosis of RCA, undergoing PCI with DES.  Previous EF 55-60% on ECHO-05/2014.  The next day pt was stable and ambulated with cardiac rehab.  He was seen and found stable for discharge by Dr. Martinique.  Continue DAPT for 1 year, follow up in 2 weeks with Dr. Aundra Dubin or APP. On BB, statin, plavix and ASA.  CKD stable at discharge Creatinine 1.75>>1.61>>1.42.  SR.  EKG without changes HR 55 sinus brady.     Consults: None  Significant Diagnostic Studies:  BMET    Component Value Date/Time   NA 136 01/20/2015 0415   K 4.1 01/20/2015 0415   CL 102 01/20/2015 0415   CO2 26 01/20/2015 0415   GLUCOSE 85 01/20/2015 0415   BUN 18 01/20/2015 0415   CREATININE 1.42* 01/20/2015 0415   CALCIUM 8.9 01/20/2015 0415   GFRNONAA 49* 01/20/2015 0415   GFRAA 56* 01/20/2015 0415    CBC    Component Value Date/Time   WBC 4.7 01/20/2015 0415   RBC 4.61 01/20/2015 0415   HGB 12.8* 01/20/2015 0415   HCT 38.2* 01/20/2015 0415   PLT 125* 01/20/2015 0415   MCV 82.9 01/20/2015 0415   MCH 27.8 01/20/2015 0415   MCHC 33.5 01/20/2015 0415   RDW 14.0 01/20/2015 0415   LYMPHSABS 1.4  01/16/2015 1647   MONOABS 0.3 01/16/2015 1647   EOSABS 0.2 01/16/2015 1647   BASOSABS 0.0 01/16/2015 1647     CHEST 2 VIEW COMPARISON: 06/03/2014 FINDINGS: Heart size is normal. Mediastinal shadows are normal. Coronary stents visible. Vascularity is normal. The lungs are clear. No effusions. Ordinary degenerative changes affect the spine. IMPRESSION: No active disease. Coronary artery stents.   CARDIAC CATH and PCI: Hemodynamics: AO 157/72 mean 109 mm Hg LV 154/24 mm Hg  Coronary angiography: Coronary dominance: right  Left mainstem: Normal  Left anterior descending (LAD): The stent in the proximal LAD is widely patent. There is a 50% stenosis in the mid vessel.   Left circumflex (LCx): There is a stent in the proximal LCx and it is widely patent. No obstructive disease.  Right coronary artery (RCA): The RCA is a large dominant vessel. It is tortuous. There is diffuse 30% disease in the proximal vessel. There is a focal 90% stenosis in the mid vessel.   Left ventriculography: Not done due to CKD.   PCI Note: Following the diagnostic procedure, the decision was made to proceed with PCI of the RCA. Weight-based bivalirudin was given for anticoagulation. Once a therapeutic ACT was achieved, a 6 [place] FR4 guide catheter was inserted. A prowater coronary guidewire was used to cross the lesion. The lesion was predilated with a  2.5 mm balloon. At this point we were unable to cross the lesion with a stent due to tortuosity of the proximal vessel and calcification. A Guideliner catheter was advanced into the mid RCA over a balloon. The lesion was then stented with a 3.5 x 20 mm Synergy stent. The stent was postdilated with a 4.0 mm noncompliant balloon. Following PCI, there was 0% residual stenosis and TIMI-3 flow. Final angiography confirmed an excellent result. The patient tolerated the procedure well. There were no immediate procedural complications. A TR  band was used for radial hemostasis. The patient was transferred to the post catheterization recovery area for further monitoring.  PCI Data: Vessel - RCA/Segment - mid  Percent Stenosis (pre) 90%  TIMI-flow 3 Stent 3.5 x 20 mm Synergy post dilated to 4.0.  Percent Stenosis (post) 0% TIMI-flow (post) 3  Final Conclusions:  1. Single vessel obstructive CAD. Stents in the proximal LAD and LCx are still patent. Progressive stenosis in the mid RCA. 2. Successful Stenting of the mid RCA with a DES.  Recommendations:  Continue DAPT for one year. Marland Kitchen   Discharge Exam: Blood pressure 119/68, pulse 63, temperature 98.7 F (37.1 C), temperature source Oral, resp. rate 17, height 6\' 4"  (1.93 m), weight 218 lb 14.7 oz (99.3 kg), SpO2 96 %.  Disposition: 01-Home or Self Care     Medication List    STOP taking these medications        K+ POTASSIUM PO     POTASSIUM GLUCONATE PO      TAKE these medications        amLODipine 5 MG tablet  Commonly known as:  NORVASC  Take 1 tablet (5 mg total) by mouth daily at 6 PM.     aspirin 81 MG tablet  Take 81 mg by mouth 2 (two) times daily.     atorvastatin 20 MG tablet  Commonly known as:  LIPITOR  TAKE 1 TABLET BY MOUTH DAILY IN THE EVENING AT 6PM     calcium carbonate 500 MG chewable tablet  Commonly known as:  TUMS - dosed in mg elemental calcium  Chew 2 tablets by mouth daily as needed for indigestion or heartburn.     clopidogrel 75 MG tablet  Commonly known as:  PLAVIX  Take 75 mg by mouth daily.     fish oil-omega-3 fatty acids 1000 MG capsule  Take 2 g by mouth 2 (two) times daily.     metoprolol 50 MG tablet  Commonly known as:  LOPRESSOR  Take 50 mg by mouth 2 (two) times daily.     nitroGLYCERIN 0.4 MG SL tablet  Commonly known as:  NITROSTAT  Place 1 tablet (0.4 mg total) under the tongue every 5 (five) minutes as needed for chest pain.     VITAMIN B-12 PO  Take 1 tablet by mouth daily at 12 noon.        Follow-up Information    Follow up with Murray Hodgkins, NP On 02/08/2015.   Specialty:  Nurse Practitioner   Why:  8:00 AM   Contact information:   [address] [place] [address] [phone]       Follow up with Unice Cobble, MD.   Specialty:  Internal Medicine   Why:  as scheduled.   Contact information:   [address] [place] [address] [phone]        Discharge Instructions: Bring your meds with you to appointment.  Call [facility]  [place] at [phone] if any bleeding, swelling or drainage at cath site.  May shower, no tub baths for 48 hours for groin sticks. No lifting over 5 pounds for 3 days.  No Driving for 3 days  Heart Healthy Diet   Do not stop Plavix or Asprin for 1 year at least. Signed: [facility]: [facility] 01/20/2015, 9:49 AM  Time spent on discharge : >30 minutes.

## 2015-01-20 NOTE — Progress Notes (Signed)
TELEMETRY: Reviewed telemetry pt in NSR: Filed Vitals:   01/19/15 2009 01/19/15 2112 01/20/15 0016 01/20/15 0525  BP: 132/71  116/62 137/74  Pulse: 59 60 63 65  Temp: 97.7 F (36.5 C)  98 F (36.7 C) 97.6 F (36.4 C)  TempSrc: Oral  Oral Oral  Resp: 16  16 17   Height:      Weight:    218 lb 14.7 oz (99.3 kg)  SpO2: 94%  96% 91%    Intake/Output Summary (Last 24 hours) at 01/20/15 0716 Last data filed at 01/19/15 1903  Gross per 24 hour  Intake 1267.2 ml  Output   1650 ml  Net -382.8 ml   Filed Weights   01/18/15 1300 01/19/15 0400 01/20/15 0525  Weight: 218 lb 11.1 oz (99.2 kg) 216 lb 14.9 oz (98.4 kg) 218 lb 14.7 oz (99.3 kg)    Subjective No complaints today. Chest pain resolved completely. Ambulating.   Marland Kitchen amLODipine  5 mg Oral Daily  . aspirin  81 mg Oral Daily  . atorvastatin  20 mg Oral q1800  . calcium carbonate  2 tablet Oral Daily  . clopidogrel  75 mg Oral Daily  . metoprolol  50 mg Oral BID      LABS: Basic Metabolic Panel:  Recent Labs  01/19/15 0440 01/20/15 0415  NA 138 136  K 4.2 4.1  CL 102 102  CO2 27 26  GLUCOSE 98 85  BUN 21 18  CREATININE 1.61* 1.42*  CALCIUM 9.0 8.9   Liver Function Tests: No results for input(s): AST, ALT, ALKPHOS, BILITOT, PROT, ALBUMIN in the last 72 hours. No results for input(s): LIPASE, AMYLASE in the last 72 hours. CBC:  Recent Labs  01/19/15 0440 01/20/15 0415  WBC 4.5 4.7  HGB 12.6* 12.8*  HCT 37.7* 38.2*  MCV 82.3 82.9  PLT 133* 125*   Cardiac Enzymes: No results for input(s): CKTOTAL, CKMB, CKMBINDEX, TROPONINI in the last 72 hours. BNP: No results for input(s): PROBNP in the last 72 hours. D-Dimer: No results for input(s): DDIMER in the last 72 hours. Hemoglobin A1C: No results for input(s): HGBA1C in the last 72 hours. Fasting Lipid Panel: No results for input(s): CHOL, HDL, LDLCALC, TRIG, CHOLHDL, LDLDIRECT in the last 72 hours. Thyroid Function Tests: No results for input(s): TSH,  T4TOTAL, T3FREE, THYROIDAB in the last 72 hours.  Invalid input(s): FREET3  Ecg today: NSR, normal.   Radiology/Studies:  No results found.  PHYSICAL EXAM General: Well developed, well nourished, in no acute distress. Head: Normocephalic, atraumatic, sclera non-icteric, oropharynx is clear Neck: Negative for carotid bruits. JVD not elevated. No adenopathy Lungs: Clear bilaterally to auscultation without wheezes, rales, or rhonchi. Breathing is unlabored. Heart: RRR S1 S2 without murmurs, rubs, or gallops.  Abdomen: Soft, non-tender, non-distended with normoactive bowel sounds. No hepatomegaly. No rebound/guarding. No obvious abdominal masses. Msk:  Strength and tone appears normal for age. Extremities: No clubbing, cyanosis or edema.  Distal pedal pulses are 2+ and equal bilaterally. No radial site hematoma.  Neuro: Alert and oriented X 3. Moves all extremities spontaneously. Psych:  Responds to questions appropriately with a normal affect.  ASSESSMENT AND PLAN: 1. Unstable angina. S/p DES of mid RCA. Old stents in LAD and LCx patent. Now pain free. Continue DAPT with ASA and Plavix for on year. On statin, beta blocker.  2. HTN- controlled on amlodipine and metoprolol.  3. Hyperlipidemia. On lipitor.  4. CKD. Creatinine 1.75>>1.61>>1.42.  Patient is stable for DC today.  Follow up in about 2 weeks.   Present on Admission:  . Chest pain with moderate risk of acute coronary syndrome  Signed, Amorah Sebring [place], [place] 01/20/2015 7:16 AM

## 2015-01-20 NOTE — Discharge Instructions (Signed)
Bring your meds with you to appointment.  Call [facility] at [phone] if any bleeding, swelling or drainage at cath site.  May shower, no tub baths for 48 hours for groin sticks. No lifting over 5 pounds for 3 days.  No Driving for 3 days  Heart Healthy Diet   Do not stop Plavix or Asprin for 1 year at least.

## 2015-01-20 NOTE — Progress Notes (Signed)
CARDIAC REHAB PHASE I   PRE:  Rate/Rhythm: 17 SR c/ PACs  BP:  Supine: 112/52 Sitting:   Standing:    SaO2: 97% RA  MODE:  Ambulation: 600 ft   POST:  Rate/Rhythm: 65  BP:  Supine:   Sitting: 147/81  Standing:    SaO2: 99% RA  [id number] Pt tolerated ambulation well with assist x1, gait steady, no c/o, VSS. PCI/stent education completed with patient and patient's wife including risk factor modification, restrictions, CP, NTG use, and calling 911, and activity progression. Heart healthy diet sheet and exercise guidelines given. Pt verbalizes understanding of instructions given. Discussed Phase 2 cardiac rehab and pt is not interested in participation at this time.  Sol Passer, MS, ACSM CCEP

## 2015-01-22 ENCOUNTER — Telehealth: Payer: Self-pay | Admitting: *Deleted

## 2015-01-22 LAB — POCT ACTIVATED CLOTTING TIME: ACTIVATED CLOTTING TIME: 379 s

## 2015-01-22 MED FILL — Sodium Chloride IV Soln 0.9%: INTRAVENOUS | Qty: 50 | Status: AC

## 2015-01-22 NOTE — Telephone Encounter (Signed)
Pt was on the tcm list d/c 01/20/15 S/p coronary artery stent placement on 4/15 s/p DES of mid RCA. Pt will be f/u with cardiology Brian Bayley, [name] on 02/08/15.Did not schedule TCM with pcp...[name]

## 2015-01-23 NOTE — Addendum Note (Signed)
Addended by: Juanda Crumble R on: 01/23/2015 01:09 PM   Modules accepted: Orders

## 2015-01-29 ENCOUNTER — Ambulatory Visit: Payer: Medicare HMO | Admitting: Cardiology

## 2015-02-08 ENCOUNTER — Ambulatory Visit (INDEPENDENT_AMBULATORY_CARE_PROVIDER_SITE_OTHER): Payer: Medicare HMO | Admitting: Nurse Practitioner

## 2015-02-08 ENCOUNTER — Encounter: Payer: Self-pay | Admitting: Nurse Practitioner

## 2015-02-08 VITALS — BP 116/72 | HR 59 | Ht 76.0 in | Wt 221.8 lb

## 2015-02-08 DIAGNOSIS — R079 Chest pain, unspecified: Secondary | ICD-10-CM | POA: Diagnosis not present

## 2015-02-08 DIAGNOSIS — N183 Chronic kidney disease, stage 3 unspecified: Secondary | ICD-10-CM

## 2015-02-08 DIAGNOSIS — I2510 Atherosclerotic heart disease of native coronary artery without angina pectoris: Secondary | ICD-10-CM | POA: Diagnosis not present

## 2015-02-08 DIAGNOSIS — I10 Essential (primary) hypertension: Secondary | ICD-10-CM | POA: Diagnosis not present

## 2015-02-08 DIAGNOSIS — E782 Mixed hyperlipidemia: Secondary | ICD-10-CM

## 2015-02-08 NOTE — Progress Notes (Signed)
Patient Name: [name] Date of Encounter: 02/08/2015  Primary Care Provider:  Unice Cobble, MD Primary Cardiologist:  Einar Crow, MD   Chief Complaint  69 year old male status post recent drug eluting stent placement to the right coronary artery who presents for follow-up.  Past Medical History   Past Medical History  Diagnosis Date  . Hypertension     did not tolerate Lisinopril event at low dose  . Hyperlipidemia   . Anemia, mild   . Vitamin B12 deficiency   . Prostate cancer     Dr Karsten Ro  . CAD (coronary artery disease)     a. 02/2010 NSTEMI/PCI: PROMUS DES to mRCA. b. 06/05/14 Cath/PCI: pLAD 70%--> s/p PCI/DES (Promus DES);  c. 01/2015 Cath/PCI: LM nl, LAD 36m, patent prox stent, LCX patent prox stent, RCA 30p, 81m (3.5x20 Synergy DES).  . CKD (chronic kidney disease), stage III   . Diastolic dysfunction     a. Echo 6/11: mild LVH, EF 55-60%, GR1DD, Trivial MR, mild LAE. b. echo 06/04/14: EF 55-60%, no WMA, GR1DD, Ao valve mildly calcifed, mild MR, mild LAE  . Melanoma     a. 1987. b. 2012  . History of pulmonary embolism   . TIA (transient ischemic attack)     a. post cath and PCI on 06/05/2014 - no residual sequelae.   Past Surgical History  Procedure Laterality Date  . Cholecystectomy    . Prostatectomy      Dr. Karsten Ro  . Knee arthroscopy      right  . Tonsillectomy    . Mrca stent  02/2010  . Melanoma excision  2011     L chest  . Melanoma excision  1991    back  . Left heart catheterization with coronary angiogram N/A 06/05/2014    Procedure: LEFT HEART CATHETERIZATION WITH CORONARY ANGIOGRAM;  Surgeon: Blane Ohara, MD;  Location: [facility] CATH LAB;  Service: Cardiovascular;  Laterality: N/A;  . Left heart catheterization with coronary angiogram N/A 01/19/2015    Procedure: LEFT HEART CATHETERIZATION WITH CORONARY ANGIOGRAM;  Surgeon: Peter M Martinique, MD;  Location: [facility] CATH LAB;  Service: Cardiovascular;  Laterality: N/A;    Allergies  Allergies    Allergen Reactions  . Lidocaine Other (See Comments)    Syncope post palmer injection; probably vasovagal as no reaction to intraarticular Lidocaine into knee    HPI  69 year old male with the above problem list. When I last saw him in clinic, he was experiencing exertional chest pain and dyspnea. He subsequently underwent diagnostic catheterization which revealed patent LAD and circumflex stents with new severe stenosis in the mid RCA. This was successfully treated with a Synergy drug-eluting stent. He tolerated the procedure well was subsequently discharged. Since his discharge, he has done well without any recurrence of chest pain or dyspnea. He has been trying to still increase his activity and says he does fatigue after about an hour of work but has not had chest pain or dyspnea. He is tolerating his medications well. He denies PND, orthopnea, dizziness, syncope, edema, or early satiety.  Home Medications  Prior to Admission medications   Medication Sig Start Date End Date Taking? Authorizing Provider  amLODipine (NORVASC) 5 MG tablet Take 1 tablet (5 mg total) by mouth daily at 6 PM. 01/20/15  Yes Isaiah Serge, NP  aspirin 81 MG tablet Take 81 mg by mouth 2 (two) times daily.    Yes Historical Provider, MD  atorvastatin (LIPITOR) 20 MG tablet TAKE  1 TABLET BY MOUTH DAILY IN THE EVENING AT 6PM 01/20/15  Yes Isaiah Serge, NP  calcium carbonate (TUMS - DOSED IN MG ELEMENTAL CALCIUM) 500 MG chewable tablet Chew 2 tablets by mouth daily as needed for indigestion or heartburn.    Yes Historical Provider, MD  clopidogrel (PLAVIX) 75 MG tablet Take 75 mg by mouth daily.   Yes Historical Provider, MD  Cyanocobalamin (VITAMIN B12 PO) Take 1 tablet by mouth every evening.   Yes Historical Provider, MD  fish oil-omega-3 fatty acids 1000 MG capsule Take 2 g by mouth 2 (two) times daily.     Yes Historical Provider, MD  metoprolol (LOPRESSOR) 50 MG tablet Take 50 mg by mouth 2 (two) times daily.    Yes Historical Provider, MD  nitroGLYCERIN (NITROSTAT) 0.4 MG SL tablet Place 1 tablet (0.4 mg total) under the tongue every 5 (five) minutes as needed for chest pain. 01/15/15  Yes Hendricks Limes, MD    Review of Systems  As above, he is doing well overall and is resuming his usual activities. He denies PND, orthopnea, dizziness, syncope, edema, or early satiety.  All other systems reviewed and are otherwise negative except as noted above.  Physical Exam  VS:  BP 116/72 mmHg  Pulse 59  Ht 6\' 4"  (1.93 m)  Wt 221 lb 12.8 oz (100.608 kg)  BMI 27.01 kg/m2 , BMI Body mass index is 27.01 kg/(m^2). GEN: Well nourished, well developed, in no acute distress. HEENT: normal. Neck: Supple, no JVD, carotid bruits, or masses. Cardiac: RRR, no murmurs, rubs, or gallops. No clubbing, cyanosis, edema.  Radials/DP/PT 2+ and equal bilaterally. Right wrist without bleeding, bruit, or hematoma. Respiratory:  Respirations regular and unlabored, clear to auscultation bilaterally. GI: Soft, nontender, nondistended, BS + x 4. MS: no deformity or atrophy. Skin: warm and dry, no rash. Neuro:  Strength and sensation are intact. Psych: Normal affect.  Accessory Clinical Findings  ECG - sinus bradycardia, 59, T-wave inversion in 3 and aVF with T flattening in V4 through V6 -these changes are new since procedure.  Assessment & Plan  1.  Coronary artery disease: Status post recent PCI and stenting of the right coronary artery in the setting of a 90% stenosis. He tolerated procedure well and renal function was stable postprocedure. Despite new T-wave inversion in inferior leads, he has been doing well post PCI without chest pain or dyspnea and is steadily increasing his activity level. He does not plan to enroll in cardiac rehabilitation. He remains on aspirin, statin, Plavix, and beta blocker therapy.  2. Essential hypertension: Stable on beta blocker.  3. Hyperlipidemia: LDL was 62 and October 2015 with  normal LFTs at that time. He remains on statin therapy.  4. Stage 3 chronic kidney disease: Creatinine was stable peri-catheterization/PCI.  5. Disposition: Follow-up with Dr. Aundra Dubin in 3 mos or sooner if necessary.  Murray Hodgkins, NP 02/08/2015, 9:08 AM

## 2015-02-08 NOTE — Patient Instructions (Signed)
Medication Instructions:   Your physician recommends that you continue on your current medications as directed. Please refer to the Current Medication list given to you today.   Labwork:   Testing/Procedures:   Follow-Up:  WITH DR [facility] IN 3 MONTHS   Any Other Special Instructions Will Be Listed Below (If Applicable).

## 2015-04-09 ENCOUNTER — Other Ambulatory Visit: Payer: Self-pay | Admitting: Cardiology

## 2015-08-07 ENCOUNTER — Other Ambulatory Visit: Payer: Self-pay | Admitting: Cardiology

## 2015-08-18 ENCOUNTER — Ambulatory Visit (INDEPENDENT_AMBULATORY_CARE_PROVIDER_SITE_OTHER): Payer: Medicare HMO | Admitting: Internal Medicine

## 2015-08-18 ENCOUNTER — Encounter: Payer: Self-pay | Admitting: Internal Medicine

## 2015-08-18 VITALS — Ht 76.0 in

## 2015-08-18 DIAGNOSIS — L309 Dermatitis, unspecified: Secondary | ICD-10-CM

## 2015-08-18 DIAGNOSIS — L03211 Cellulitis of face: Secondary | ICD-10-CM

## 2015-08-18 MED ORDER — DICLOXACILLIN SODIUM 500 MG PO CAPS: 500.0000 mg | ORAL_CAPSULE | Freq: Four times a day (QID) | ORAL | Status: DC

## 2015-08-18 MED ORDER — DESONIDE 0.05 % EX CREA: TOPICAL_CREAM | Freq: Two times a day (BID) | CUTANEOUS | Status: DC

## 2015-08-18 NOTE — Progress Notes (Signed)
Pre visit review using our clinic review tool, if applicable. No additional management support is needed unless otherwise documented below in the visit note.   Chief Complaint  Patient presents with  . Cellulitis    HPI: Patient comes in today for SDA Saturday clinic for  new problem evaluation. Onset 36 hours cellulitis  With red area left ear and increasing over night ti left side of face . No fever chills minimal tendernsss but has had this 2015 with fever   Right side .  No injury rash but some itchy place on pinna may have  Preceded this .   Refill cream ? Dr Ubaldo Glassing  Desonide . Itchy patch rle  chonric  Dry not red  Hx of skin cancer  ROS: See pertinent positives and negatives per HPI.  Past Medical History  Diagnosis Date  . Hypertension     did not tolerate Lisinopril event at low dose  . Hyperlipidemia   . Anemia, mild   . Vitamin B12 deficiency   . Prostate cancer ([place])     Dr Karsten Ro  . CAD (coronary artery disease)     a. 02/2010 NSTEMI/PCI: PROMUS DES to mRCA. b. 06/05/14 Cath/PCI: pLAD 70%--> s/p PCI/DES (Promus DES);  c. 01/2015 Cath/PCI: LM nl, LAD 92m, patent prox stent, LCX patent prox stent, RCA 30p, 66m (3.5x20 Synergy DES).  . CKD (chronic kidney disease), stage III   . Diastolic dysfunction     a. Echo 6/11: mild LVH, EF 55-60%, GR1DD, Trivial MR, mild LAE. b. echo 06/04/14: EF 55-60%, no WMA, GR1DD, Ao valve mildly calcifed, mild MR, mild LAE  . Melanoma ([place])     a. 1987. b. 2012  . History of pulmonary embolism   . TIA (transient ischemic attack)     a. post cath and PCI on 06/05/2014 - no residual sequelae.    Family History  Problem Relation Age of Onset  . Coronary artery disease Mother     S/P CABG, in her 34s  . Transient ischemic attack Mother 74  . Prostate cancer Father   . Hypertension Brother   . Diabetes Sister     Social History   Social History  . Marital Status: Married    Spouse Name: N/A  . Number of Children: N/A  . Years of  Education: N/A   Social History Main Topics  . Smoking status: Never Smoker   . Smokeless tobacco: Never Used  . Alcohol Use: No  . Drug Use: No  . Sexual Activity: Not Asked   Other Topics Concern  . None   Social History Narrative    Outpatient Prescriptions Prior to Visit  Medication Sig Dispense Refill  . amLODipine (NORVASC) 5 MG tablet TAKE 1 TABLET (5 MG TOTAL) BY MOUTH DAILY. 90 tablet 2  . aspirin 81 MG tablet Take 81 mg by mouth 2 (two) times daily.     Marland Kitchen atorvastatin (LIPITOR) 20 MG tablet TAKE 1 TABLET BY MOUTH DAILY 90 tablet 1  . calcium carbonate (TUMS - DOSED IN MG ELEMENTAL CALCIUM) 500 MG chewable tablet Chew 2 tablets by mouth daily as needed for indigestion or heartburn.     . clopidogrel (PLAVIX) 75 MG tablet TAKE 1 TABLET EVERY DAY 90 tablet 1  . Cyanocobalamin (VITAMIN B12 PO) Take 1 tablet by mouth every evening.    . fish oil-omega-3 fatty acids 1000 MG capsule Take 2 g by mouth 2 (two) times daily.      . metoprolol (LOPRESSOR)  50 MG tablet TAKE 1 TABLET BY MOUTH TWICE A DAY 180 tablet 1  . nitroGLYCERIN (NITROSTAT) 0.4 MG SL tablet Place 1 tablet (0.4 mg total) under the tongue every 5 (five) minutes as needed for chest pain. 30 tablet 3  . amLODipine (NORVASC) 5 MG tablet Take 1 tablet (5 mg total) by mouth daily at 6 PM. 90 tablet 1   No facility-administered medications prior to visit.     EXAM:  Ht 6\' 4"  (1.93 m)  There is no weight on file to calculate BMI.  GENERAL: vitals reviewed and listed above, alert, oriented, appears well hydrated and in no acute distress  Obvious  Redness and swelling  Left ear auricle and  Anterior  to this  eac clear tm clear  No induration   Some cracking skin top of ear  HEENT: atraumatic, conjunctiva  clear, no obvious abnormalities on inspection of external nose and ears  Except above OP : no lesion edema or exudate  NECK: no obvious masses on inspection palpation  Supple   Skin: normal capillary refill ,turgor  , color:  ,petechiae or bruising except as above   dy whtie patch righ lateral leg dry skin .   MS: moves all extremities without noticeable focal  abnormality PSYCH: pleasant and cooperative, no obvious depression or anxiety  ASSESSMENT AND PLAN:  Discussed the following assessment and plan:  Facial cellulitis - scond time 2015 right ? realted to cracking around ear ?  add antibiotic ed for fever alarm sx  fu in  not better 3-5 days or keep appt dr H in 9 days   Dermatitis - r lateral dry patch  ? winter eczema or contact can use moisturizer and steroid as neede d  mark line of redness take pix seek ed if alarm sx   And plan for fu .  -Patient advised to return or notify health care team  if symptoms worsen ,persist or new concerns arise.  Patient Instructions  I agree  skin infection cellulitis in the face  Uncertain  why you are getting it but could be .   Advise fu visit   In 3-5 days   With PCP or dermatology .   Can use the desonide on itchy are on leg .   Cellulitis Cellulitis is an infection of the skin and the tissue beneath it. The infected area is usually red and tender. Cellulitis occurs most often in the arms and lower legs.  CAUSES  Cellulitis is caused by bacteria that enter the skin through cracks or cuts in the skin. The most common types of bacteria that cause cellulitis are staphylococci and streptococci. SIGNS AND SYMPTOMS   Redness and warmth.  Swelling.  Tenderness or pain.  Fever. DIAGNOSIS  Your health care provider can usually determine what is wrong based on a physical exam. Blood tests may also be done. TREATMENT  Treatment usually involves taking an antibiotic medicine. HOME CARE INSTRUCTIONS   Take your antibiotic medicine as directed by your health care provider. Finish the antibiotic even if you start to feel better.  Keep the infected arm or leg elevated to reduce swelling.  Apply a warm cloth to the affected area up to 4 times per day to  relieve pain.  Take medicines only as directed by your health care provider.  Keep all follow-up visits as directed by your health care provider. SEEK MEDICAL CARE IF:   You notice red streaks coming from the infected area.  Your  red area gets larger or turns dark in color.  Your bone or joint underneath the infected area becomes painful after the skin has healed.  Your infection returns in the same area or another area.  You notice a swollen bump in the infected area.  You develop new symptoms.  You have a fever. SEEK IMMEDIATE MEDICAL CARE IF:   You feel very sleepy.  You develop vomiting or diarrhea.  You have a general ill feeling (malaise) with muscle aches and pains.   This information is not intended to replace advice given to you by your health care provider. Make sure you discuss any questions you have with your health care provider.   Document Released: 07/02/2005 Document Revised: 06/13/2015 Document Reviewed: 12/08/2011 Elsevier Interactive Patient Education 2016 [place] K. Panosh M.D.

## 2015-08-18 NOTE — Patient Instructions (Signed)
I agree  skin infection cellulitis in the face  Uncertain  why you are getting it but could be .   Advise fu visit   In 3-5 days   With PCP or dermatology .   Can use the desonide on itchy are on leg .   Cellulitis Cellulitis is an infection of the skin and the tissue beneath it. The infected area is usually red and tender. Cellulitis occurs most often in the arms and lower legs.  CAUSES  Cellulitis is caused by bacteria that enter the skin through cracks or cuts in the skin. The most common types of bacteria that cause cellulitis are staphylococci and streptococci. SIGNS AND SYMPTOMS   Redness and warmth.  Swelling.  Tenderness or pain.  Fever. DIAGNOSIS  Your health care provider can usually determine what is wrong based on a physical exam. Blood tests may also be done. TREATMENT  Treatment usually involves taking an antibiotic medicine. HOME CARE INSTRUCTIONS   Take your antibiotic medicine as directed by your health care provider. Finish the antibiotic even if you start to feel better.  Keep the infected arm or leg elevated to reduce swelling.  Apply a warm cloth to the affected area up to 4 times per day to relieve pain.  Take medicines only as directed by your health care provider.  Keep all follow-up visits as directed by your health care provider. SEEK MEDICAL CARE IF:   You notice red streaks coming from the infected area.  Your red area gets larger or turns dark in color.  Your bone or joint underneath the infected area becomes painful after the skin has healed.  Your infection returns in the same area or another area.  You notice a swollen bump in the infected area.  You develop new symptoms.  You have a fever. SEEK IMMEDIATE MEDICAL CARE IF:   You feel very sleepy.  You develop vomiting or diarrhea.  You have a general ill feeling (malaise) with muscle aches and pains.   This information is not intended to replace advice given to you by your health  care provider. Make sure you discuss any questions you have with your health care provider.   Document Released: 07/02/2005 Document Revised: 06/13/2015 Document Reviewed: 12/08/2011 Elsevier Interactive Patient Education [employer].

## 2015-08-27 ENCOUNTER — Ambulatory Visit (INDEPENDENT_AMBULATORY_CARE_PROVIDER_SITE_OTHER): Payer: Medicare HMO | Admitting: Internal Medicine

## 2015-08-27 ENCOUNTER — Encounter: Payer: Self-pay | Admitting: Internal Medicine

## 2015-08-27 ENCOUNTER — Other Ambulatory Visit (INDEPENDENT_AMBULATORY_CARE_PROVIDER_SITE_OTHER): Payer: Medicare HMO

## 2015-08-27 VITALS — BP 148/78 | HR 58 | Temp 97.6°F | Wt 220.0 lb

## 2015-08-27 DIAGNOSIS — D510 Vitamin B12 deficiency anemia due to intrinsic factor deficiency: Secondary | ICD-10-CM

## 2015-08-27 DIAGNOSIS — I10 Essential (primary) hypertension: Secondary | ICD-10-CM

## 2015-08-27 DIAGNOSIS — R739 Hyperglycemia, unspecified: Secondary | ICD-10-CM | POA: Diagnosis not present

## 2015-08-27 DIAGNOSIS — E782 Mixed hyperlipidemia: Secondary | ICD-10-CM | POA: Diagnosis not present

## 2015-08-27 LAB — BASIC METABOLIC PANEL
BUN: 18 mg/dL (ref 6–23)
CHLORIDE: 104 meq/L (ref 96–112)
CO2: 27 mEq/L (ref 19–32)
Calcium: 10.1 mg/dL (ref 8.4–10.5)
Creatinine, Ser: 1.58 mg/dL — ABNORMAL HIGH (ref 0.40–1.50)
GFR: 46.14 mL/min — ABNORMAL LOW (ref >60.00)
Glucose, Bld: 99 mg/dL (ref 70–99)
POTASSIUM: 4.8 meq/L (ref 3.5–5.1)
SODIUM: 140 meq/L (ref 135–145)

## 2015-08-27 LAB — TSH: TSH: 4.9 u[IU]/mL — ABNORMAL HIGH (ref 0.35–4.50)

## 2015-08-27 LAB — LIPID PANEL
CHOLESTEROL: 137 mg/dL (ref 0–200)
HDL: 38.7 mg/dL — ABNORMAL LOW (ref >39.00)
LDL Cholesterol: 73 mg/dL (ref 0–99)
NonHDL: 98.04
TRIGLYCERIDES: 124 mg/dL (ref 0.0–149.0)
Total CHOL/HDL Ratio: 4
VLDL: 24.8 mg/dL (ref 0.0–40.0)

## 2015-08-27 LAB — HEPATIC FUNCTION PANEL
ALBUMIN: 4.5 g/dL (ref 3.5–5.2)
ALT: 16 U/L (ref 0–53)
AST: 18 U/L (ref 0–37)
Alkaline Phosphatase: 70 U/L (ref 39–117)
BILIRUBIN TOTAL: 0.4 mg/dL (ref 0.2–1.2)
Bilirubin, Direct: 0.1 mg/dL (ref 0.0–0.3)
TOTAL PROTEIN: 7.3 g/dL (ref 6.0–8.3)

## 2015-08-27 LAB — VITAMIN B12: Vitamin B-12: 703 pg/mL (ref 211–911)

## 2015-08-27 LAB — HEMOGLOBIN A1C: HEMOGLOBIN A1C: 5.5 % (ref 4.6–6.5)

## 2015-08-27 NOTE — Assessment & Plan Note (Addendum)
Lipids, LFTs, TSH  

## 2015-08-27 NOTE — Assessment & Plan Note (Signed)
A1c

## 2015-08-27 NOTE — Progress Notes (Signed)
   Subjective:    Patient ID: [name], male    DOB: 06-24-1946, 69 y.o.   MRN: [id number]  HPI The patient is here to assess status of active health conditions.  PMH, FH, & Social History reviewed & updated.No change in [place] as recorded.  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 He avoids fried foods and processed foods. He's physically active hunting and doing yardwork without associated cardio pulmonary symptoms Colonoscopy was done 2006; he has no active GI symptoms. His most recent labs in April 2016 during his admission for RCA stenting revealed creatinine of 1.42; GFR 49; hemoglobin 12.8; and hematocrit 38.2.His last lipids on record were in October 2015. LDL was at goal at 62; HDL was 34.4. He saw his cardiologist in May of this year.   Review of Systems He's had intermittent rash over the right lateral calf for which he has seen a dermatologist. The cream she prescribed has been of some benefit.  3 weeks ago he hurt his back moving a deer carcass. He has intermittent numbness and tingling in the right thigh. Biofreeze has been of some benefit. He does not want to take medication for this. He has no associated incontinence of urine or stool.  Chest pain, palpitations, tachycardia, exertional dyspnea, paroxysmal nocturnal dyspnea, claudication or edema are absent. No unexplained weight loss, abdominal pain, significant dyspepsia, dysphagia, melena, rectal bleeding, or persistently small caliber stools. Dysuria, pyuria, hematuria, frequency, nocturia or polyuria are denied. Change in hair or nails denied. No bowel changes of constipation or diarrhea. No intolerance to heat or cold.     Objective:   Physical Exam  Pertinent or positive findings include: He has mild crepitus in the knees. There is full range of motion of the lower extremities without associated pain. Straight leg raising is negative bilaterally. Heel and toe walking was completed without  difficulty. GU as per Urology.  General appearance :adequately nourished; in no distress.  Eyes: No conjunctival inflammation or scleral icterus is present.  Oral exam:  Lips and gums are healthy appearing.There is no oropharyngeal erythema or exudate noted. Dental hygiene is good.  Heart:  Normal rate and regular rhythm. S1 and S2 normal without gallop, murmur, click, rub or other extra sounds    Lungs:Chest clear to auscultation; no wheezes, rhonchi,rales ,or rubs present.No increased work of breathing.   Abdomen: bowel sounds normal, soft and non-tender without masses, organomegaly or hernias noted.  No guarding or rebound. No flank or LS spine  tenderness to percussion.  Vascular : all pulses equal ; no bruits present.  Skin:Warm & dry.  Intact without suspicious lesions or rashes ; no tenting or jaundice   Lymphatic: No lymphadenopathy is noted about the head, neck, axilla.   Neuro: Strength, tone & DTRs normal.      Assessment & Plan:  See Current Assessment & Plan in Problem List under specific Diagnosis

## 2015-08-27 NOTE — Assessment & Plan Note (Signed)
CBC,B12 and methylmalonic acid level

## 2015-08-27 NOTE — Progress Notes (Signed)
Pre visit review using our clinic review tool, if applicable. No additional management support is needed unless otherwise documented below in the visit note. 

## 2015-08-27 NOTE — Assessment & Plan Note (Signed)
Blood pressure goals reviewed. BMET 

## 2015-08-27 NOTE — Patient Instructions (Addendum)
Minimal Blood Pressure Goal= AVERAGE < 140/90;  Ideal is an AVERAGE < 135/85. This AVERAGE should be calculated from @ least 5-7 BP readings taken @ different times of day on different days of week. You should not respond to isolated BP readings , but rather the AVERAGE for that week .Please bring your  blood pressure cuff to office visits to verify that it is reliable.It  can also be checked against the blood pressure device at the pharmacy. Finger or wrist cuffs are not dependable; an arm cuff is.  Use an anti-inflammatory cream such as Aspercreme or Zostrix cream twice a day to the affected area as needed. In lieu of this warm moist compresses or  hot water bottle can be used. Do not apply ice .The best exercises for the low back include freestyle swimming, stretch aerobics, and yoga.Cybex & Nautilus machines rather than dead weights are better for the back.

## 2015-08-28 LAB — CBC WITH DIFFERENTIAL/PLATELET
BASOS PCT: 0.6 % (ref 0.0–3.0)
Basophils Absolute: 0 10*3/uL (ref 0.0–0.1)
EOS PCT: 6.5 % — AB (ref 0.0–5.0)
Eosinophils Absolute: 0.3 10*3/uL (ref 0.0–0.7)
HEMATOCRIT: 40.6 % (ref 39.0–52.0)
HEMOGLOBIN: 13.6 g/dL (ref 13.0–17.0)
Lymphocytes Relative: 30.9 % (ref 12.0–46.0)
Lymphs Abs: 1.4 10*3/uL (ref 0.7–4.0)
MCHC: 33.6 g/dL (ref 30.0–36.0)
MCV: 83.1 fl (ref 78.0–100.0)
MONO ABS: 0.3 10*3/uL (ref 0.1–1.0)
MONOS PCT: 7.3 % (ref 3.0–12.0)
Neutro Abs: 2.4 10*3/uL (ref 1.4–7.7)
Neutrophils Relative %: 54.7 % (ref 43.0–77.0)
Platelets: 206 10*3/uL (ref 150.0–400.0)
RBC: 4.89 Mil/uL (ref 4.22–5.81)
RDW: 15 % (ref 11.5–15.5)
WBC: 4.4 10*3/uL (ref 4.0–10.5)

## 2015-08-29 ENCOUNTER — Other Ambulatory Visit: Payer: Self-pay | Admitting: Internal Medicine

## 2015-08-29 DIAGNOSIS — I10 Essential (primary) hypertension: Secondary | ICD-10-CM

## 2015-08-29 DIAGNOSIS — R7989 Other specified abnormal findings of blood chemistry: Secondary | ICD-10-CM | POA: Insufficient documentation

## 2015-08-29 DIAGNOSIS — N183 Chronic kidney disease, stage 3 unspecified: Secondary | ICD-10-CM

## 2015-09-01 LAB — METHYLMALONIC ACID, SERUM: Methylmalonic Acid, Quant: 181 nmol/L (ref 87–318)

## 2015-09-25 ENCOUNTER — Ambulatory Visit: Payer: Medicare HMO

## 2015-10-11 ENCOUNTER — Telehealth: Payer: Self-pay

## 2015-10-11 NOTE — Telephone Encounter (Signed)
Left Voice Mail for pt to call back.   RE: Flu Vaccine for 2016  

## 2015-11-05 ENCOUNTER — Ambulatory Visit (INDEPENDENT_AMBULATORY_CARE_PROVIDER_SITE_OTHER): Payer: Medicare HMO

## 2015-11-05 VITALS — BP 128/80 | Ht 76.0 in | Wt 221.5 lb

## 2015-11-05 DIAGNOSIS — Z0000 Encounter for general adult medical examination without abnormal findings: Secondary | ICD-10-CM | POA: Diagnosis not present

## 2015-11-05 DIAGNOSIS — Z1159 Encounter for screening for other viral diseases: Secondary | ICD-10-CM

## 2015-11-05 NOTE — Patient Instructions (Addendum)
[name] , Thank you for taking time to come for your Medicare Wellness Visit. I appreciate your ongoing commitment to your health goals. Please review the following plan we discussed and let me know if I can assist you in the future.   Will have eye exam  These are the goals we discussed: Goals    None      This is a list of the screening recommended for you and due dates:  Health Maintenance  Topic Date Due  .  Hepatitis C: One time screening is recommended by Center for Disease Control  (CDC) for  adults born from [birth year] through 1967.   June 10, 1946  . Shingles Vaccine  04/26/2004  . Pneumonia vaccines (1 of 2 - PCV13) 04/26/2009  . Colon Cancer Screening  11/19/2014  . Flu Shot  01/04/2016*  . Tetanus Vaccine  06/14/2020  *Topic was postponed. The date shown is not the original due date.  Health Maintenance, Male A healthy lifestyle and preventative care can promote health and wellness.  Maintain regular health, dental, and eye exams.  Eat a healthy diet. Foods like vegetables, fruits, whole grains, low-fat dairy products, and lean protein foods contain the nutrients you need and are low in calories. Decrease your intake of foods high in solid fats, added sugars, and salt. Get information about a proper diet from your health care provider, if necessary.  Regular physical exercise is one of the most important things you can do for your health. Most adults should get at least 150 minutes of moderate-intensity exercise (any activity that increases your heart rate and causes you to sweat) each week. In addition, most adults need muscle-strengthening exercises on 2 or more days a week.   Maintain a healthy weight. The body mass index (BMI) is a screening tool to identify possible weight problems. It provides an estimate of body fat based on height and weight. Your health care provider can find your BMI and can help you achieve or maintain a healthy weight. For males 20 years and  older:  A BMI below 18.5 is considered underweight.  A BMI of 18.5 to 24.9 is normal.  A BMI of 25 to 29.9 is considered overweight.  A BMI of 30 and above is considered obese.  Maintain normal blood lipids and cholesterol by exercising and minimizing your intake of saturated fat. Eat a balanced diet with plenty of fruits and vegetables. Blood tests for lipids and cholesterol should begin at age 76 and be repeated every 5 years. If your lipid or cholesterol levels are high, you are over age 11, or you are at high risk for heart disease, you may need your cholesterol levels checked more frequently.Ongoing high lipid and cholesterol levels should be treated with medicines if diet and exercise are not working.  If you smoke, find out from your health care provider how to quit. If you do not use tobacco, do not start.  Lung cancer screening is recommended for adults aged 74-80 years who are at high risk for developing lung cancer because of a history of smoking. A yearly low-dose CT scan of the lungs is recommended for people who have at least a 30-pack-year history of smoking and are current smokers or have quit within the past 15 years. A pack year of smoking is smoking an average of 1 pack of cigarettes a day for 1 year (for example, a 30-pack-year history of smoking could mean smoking 1 pack a day for 30 years or 2  packs a day for 15 years). Yearly screening should continue until the smoker has stopped smoking for at least 15 years. Yearly screening should be stopped for people who develop a health problem that would prevent them from having lung cancer treatment.  If you choose to drink alcohol, do not have more than 2 drinks per day. One drink is considered to be 12 oz (360 mL) of beer, 5 oz (150 mL) of wine, or 1.5 oz (45 mL) of liquor.  Avoid the use of street drugs. Do not share needles with anyone. Ask for help if you need support or instructions about stopping the use of drugs.  High  blood pressure causes heart disease and increases the risk of stroke. High blood pressure is more likely to develop in:  People who have blood pressure in the end of the normal range (100-139/85-89 mm Hg).  People who are overweight or obese.  People who are African American.  If you are 79-53 years of age, have your blood pressure checked every 3-5 years. If you are 48 years of age or older, have your blood pressure checked every year. You should have your blood pressure measured twice--once when you are at a hospital or clinic, and once when you are not at a hospital or clinic. Record the average of the two measurements. To check your blood pressure when you are not at a hospital or clinic, you can use:  An automated blood pressure machine at a pharmacy.  A home blood pressure monitor.  If you are 24-46 years old, ask your health care provider if you should take aspirin to prevent heart disease.  Diabetes screening involves taking a blood sample to check your fasting blood sugar level. This should be done once every 3 years after age 27 if you are at a normal weight and without risk factors for diabetes. Testing should be considered at a younger age or be carried out more frequently if you are overweight and have at least 1 risk factor for diabetes.  Colorectal cancer can be detected and often prevented. Most routine colorectal cancer screening begins at the age of 32 and continues through age 55. However, your health care provider may recommend screening at an earlier age if you have risk factors for colon cancer. On a yearly basis, your health care provider may provide home test kits to check for hidden blood in the stool. A small camera at the end of a tube may be used to directly examine the colon (sigmoidoscopy or colonoscopy) to detect the earliest forms of colorectal cancer. Talk to your health care provider about this at age 77 when routine screening begins. A direct exam of the colon  should be repeated every 5-10 years through age 71, unless early forms of precancerous polyps or small growths are found.  People who are at an increased risk for hepatitis B should be screened for this virus. You are considered at high risk for hepatitis B if:  You were born in a country where hepatitis B occurs often. Talk with your health care provider about which countries are considered high risk.  Your parents were born in a high-risk country and you have not received a shot to protect against hepatitis B (hepatitis B vaccine).  You have HIV or AIDS.  You use needles to inject street drugs.  You live with, or have sex with, someone who has hepatitis B.  You are a man who has sex with other men (MSM).  You get hemodialysis treatment.  You take certain medicines for conditions like cancer, organ transplantation, and autoimmune conditions.  Hepatitis C blood testing is recommended for all people born from [birth year] through 1965 and any individual with known risk factors for hepatitis C.  Healthy men should no longer receive prostate-specific antigen (PSA) blood tests as part of routine cancer screening. Talk to your health care provider about prostate cancer screening.  Testicular cancer screening is not recommended for adolescents or adult males who have no symptoms. Screening includes self-exam, a health care provider exam, and other screening tests. Consult with your health care provider about any symptoms you have or any concerns you have about testicular cancer.  Practice safe sex. Use condoms and avoid high-risk sexual practices to reduce the spread of sexually transmitted infections (STIs).  You should be screened for STIs, including gonorrhea and chlamydia if:  You are sexually active and are younger than 24 years.  You are older than 24 years, and your health care provider tells you that you are at risk for this type of infection.  Your sexual activity has changed since you  were last screened, and you are at an increased risk for chlamydia or gonorrhea. Ask your health care provider if you are at risk.  If you are at risk of being infected with HIV, it is recommended that you take a prescription medicine daily to prevent HIV infection. This is called pre-exposure prophylaxis (PrEP). You are considered at risk if:  You are a man who has sex with other men (MSM).  You are a heterosexual man who is sexually active with multiple partners.  You take drugs by injection.  You are sexually active with a partner who has HIV.  Talk with your health care provider about whether you are at high risk of being infected with HIV. If you choose to begin PrEP, you should first be tested for HIV. You should then be tested every 3 months for as long as you are taking PrEP.  Use sunscreen. Apply sunscreen liberally and repeatedly throughout the day. You should seek shade when your shadow is shorter than you. Protect yourself by wearing long sleeves, pants, a wide-brimmed hat, and sunglasses year round whenever you are outdoors.  Tell your health care provider of new moles or changes in moles, especially if there is a change in shape or color. Also, tell your health care provider if a mole is larger than the size of a pencil eraser.  A one-time screening for abdominal aortic aneurysm (AAA) and surgical repair of large AAAs by ultrasound is recommended for men aged 61-75 years who are current or former smokers.  Stay current with your vaccines (immunizations).   This information is not intended to replace advice given to you by your health care provider. Make sure you discuss any questions you have with your health care provider.   Document Released: 03/20/2008 Document Revised: 10/13/2014 Document Reviewed: 02/17/2011 Elsevier Interactive Patient Education 2016 [place] Maintenance, Male A healthy lifestyle and preventative care can promote health and  wellness.  Maintain regular health, dental, and eye exams.  Eat a healthy diet. Foods like vegetables, fruits, whole grains, low-fat dairy products, and lean protein foods contain the nutrients you need and are low in calories. Decrease your intake of foods high in solid fats, added sugars, and salt. Get information about a proper diet from your health care provider, if necessary.  Regular physical exercise is one of the  most important things you can do for your health. Most adults should get at least 150 minutes of moderate-intensity exercise (any activity that increases your heart rate and causes you to sweat) each week. In addition, most adults need muscle-strengthening exercises on 2 or more days a week.   Maintain a healthy weight. The body mass index (BMI) is a screening tool to identify possible weight problems. It provides an estimate of body fat based on height and weight. Your health care provider can find your BMI and can help you achieve or maintain a healthy weight. For males 20 years and older:  A BMI below 18.5 is considered underweight.  A BMI of 18.5 to 24.9 is normal.  A BMI of 25 to 29.9 is considered overweight.  A BMI of 30 and above is considered obese.  Maintain normal blood lipids and cholesterol by exercising and minimizing your intake of saturated fat. Eat a balanced diet with plenty of fruits and vegetables. Blood tests for lipids and cholesterol should begin at age 64 and be repeated every 5 years. If your lipid or cholesterol levels are high, you are over age 31, or you are at high risk for heart disease, you may need your cholesterol levels checked more frequently.Ongoing high lipid and cholesterol levels should be treated with medicines if diet and exercise are not working.  If you smoke, find out from your health care provider how to quit. If you do not use tobacco, do not start.  Lung cancer screening is recommended for adults aged 73-80 years who are at high  risk for developing lung cancer because of a history of smoking. A yearly low-dose CT scan of the lungs is recommended for people who have at least a 30-pack-year history of smoking and are current smokers or have quit within the past 15 years. A pack year of smoking is smoking an average of 1 pack of cigarettes a day for 1 year (for example, a 30-pack-year history of smoking could mean smoking 1 pack a day for 30 years or 2 packs a day for 15 years). Yearly screening should continue until the smoker has stopped smoking for at least 15 years. Yearly screening should be stopped for people who develop a health problem that would prevent them from having lung cancer treatment.  If you choose to drink alcohol, do not have more than 2 drinks per day. One drink is considered to be 12 oz (360 mL) of beer, 5 oz (150 mL) of wine, or 1.5 oz (45 mL) of liquor.  Avoid the use of street drugs. Do not share needles with anyone. Ask for help if you need support or instructions about stopping the use of drugs.  High blood pressure causes heart disease and increases the risk of stroke. High blood pressure is more likely to develop in:  People who have blood pressure in the end of the normal range (100-139/85-89 mm Hg).  People who are overweight or obese.  People who are African American.  If you are 15-42 years of age, have your blood pressure checked every 3-5 years. If you are 63 years of age or older, have your blood pressure checked every year. You should have your blood pressure measured twice--once when you are at a hospital or clinic, and once when you are not at a hospital or clinic. Record the average of the two measurements. To check your blood pressure when you are not at a hospital or clinic, you can use:  An  automated blood pressure machine at a pharmacy.  A home blood pressure monitor.  If you are 26-43 years old, ask your health care provider if you should take aspirin to prevent heart  disease.  Diabetes screening involves taking a blood sample to check your fasting blood sugar level. This should be done once every 3 years after age 7 if you are at a normal weight and without risk factors for diabetes. Testing should be considered at a younger age or be carried out more frequently if you are overweight and have at least 1 risk factor for diabetes.  Colorectal cancer can be detected and often prevented. Most routine colorectal cancer screening begins at the age of 72 and continues through age 71. However, your health care provider may recommend screening at an earlier age if you have risk factors for colon cancer. On a yearly basis, your health care provider may provide home test kits to check for hidden blood in the stool. A small camera at the end of a tube may be used to directly examine the colon (sigmoidoscopy or colonoscopy) to detect the earliest forms of colorectal cancer. Talk to your health care provider about this at age 26 when routine screening begins. A direct exam of the colon should be repeated every 5-10 years through age 23, unless early forms of precancerous polyps or small growths are found.  People who are at an increased risk for hepatitis B should be screened for this virus. You are considered at high risk for hepatitis B if:  You were born in a country where hepatitis B occurs often. Talk with your health care provider about which countries are considered high risk.  Your parents were born in a high-risk country and you have not received a shot to protect against hepatitis B (hepatitis B vaccine).  You have HIV or AIDS.  You use needles to inject street drugs.  You live with, or have sex with, someone who has hepatitis B.  You are a man who has sex with other men (MSM).  You get hemodialysis treatment.  You take certain medicines for conditions like cancer, organ transplantation, and autoimmune conditions.  Hepatitis C blood testing is recommended  for all people born from [birth year] through 1967 and any individual with known risk factors for hepatitis C.  Healthy men should no longer receive prostate-specific antigen (PSA) blood tests as part of routine cancer screening. Talk to your health care provider about prostate cancer screening.  Testicular cancer screening is not recommended for adolescents or adult males who have no symptoms. Screening includes self-exam, a health care provider exam, and other screening tests. Consult with your health care provider about any symptoms you have or any concerns you have about testicular cancer.  Practice safe sex. Use condoms and avoid high-risk sexual practices to reduce the spread of sexually transmitted infections (STIs).  You should be screened for STIs, including gonorrhea and chlamydia if:  You are sexually active and are younger than 24 years.  You are older than 24 years, and your health care provider tells you that you are at risk for this type of infection.  Your sexual activity has changed since you were last screened, and you are at an increased risk for chlamydia or gonorrhea. Ask your health care provider if you are at risk.  If you are at risk of being infected with HIV, it is recommended that you take a prescription medicine daily to prevent HIV infection. This is called pre-exposure prophylaxis (  PrEP). You are considered at risk if:  You are a man who has sex with other men (MSM).  You are a heterosexual man who is sexually active with multiple partners.  You take drugs by injection.  You are sexually active with a partner who has HIV.  Talk with your health care provider about whether you are at high risk of being infected with HIV. If you choose to begin PrEP, you should first be tested for HIV. You should then be tested every 3 months for as long as you are taking PrEP.  Use sunscreen. Apply sunscreen liberally and repeatedly throughout the day. You should seek shade when your  shadow is shorter than you. Protect yourself by wearing long sleeves, pants, a wide-brimmed hat, and sunglasses year round whenever you are outdoors.  Tell your health care provider of new moles or changes in moles, especially if there is a change in shape or color. Also, tell your health care provider if a mole is larger than the size of a pencil eraser.  A one-time screening for abdominal aortic aneurysm (AAA) and surgical repair of large AAAs by ultrasound is recommended for men aged 45-75 years who are current or former smokers.  Stay current with your vaccines (immunizations).   This information is not intended to replace advice given to you by your health care provider. Make sure you discuss any questions you have with your health care provider.   Document Released: 03/20/2008 Document Revised: 10/13/2014 Document Reviewed: 02/17/2011 Elsevier Interactive Patient Education [employer].

## 2015-11-05 NOTE — Progress Notes (Addendum)
Subjective:   [name] is a 70 y.o. male who presents for Medicare Annual/Subsequent preventive examination.  Review of Systems:   Cardiac Risk Factors include: advanced age (>22men, >55 women);dyslipidemia;hypertension;microalbuminuria HRA assessment completed during visit; [place], [place]  The Patient was informed that this wellness visit is to identify risk and educate on how to reduce risk for increase disease through lifestyle changes.   ROS deferred to CPE exam with physician Having Blood test today; Pain in right lower back; Pulled back x 6 weeks ago  Medical issues  Unstable angina x 70 yo; last year had 2 stents; (52 and 21)  HTN; 144/80 today but had not taken meds Down to 128/ 80 2nd check CVA: states may be secondary to stent  Melanoma; in left upper chest now resolved  Prostate Ca/ checks prostate annually Lipids 08/2015; Cho 137; Trig 124; HDL 38 and LDL 73 A1c 5.5  Creat 1.5 a year ago; 1.76 4/12 GFR; 49.49 one year ago; 40.81 9 months ago. 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;hunts and eats venison for meat   Em telephone company and retired at 11;  [place] children; 4; (3 girls and boy)   [place]; one level  Safety reviewed for the home;   Bathroom safety; shower and tub;  Community safety; yes Smoke detectors: yes Firearms safety keep in safe area Driving accidents and seatbelt/ no issues Sun protection/ yes; hat and long sleeves when out  Stressors; not measureable   Medication review/ med reviewed and was compliant;   Fall  assessment/no  Mobilization and Functional losses in the last year; no; just do it slower Sleep patterns; sleeps well   Urinary or fecal incontinence reviewed/ no/ takes a stool softner/ no other issues   Counseling: Hep C will have when he draws blood;  Colonoscopy; 11/2004 States he has had several;  Took prostate:  Claybon Jabs, M.D., [facility] Not sure when he is due again.  EKG: 02/05/1015 Hearing: 4000hz   Ophthalmology exam; no eye; will consider this;  Dr. Bing Plume  Immunizations Due: zostavax; PCV 13/ declined    Current Care Team reviewed and updated      Objective:    Vitals: BP 144/80 mmHg  Ht 6\' 4"  (1.93 m)  Wt 221 lb 8 oz (100.472 kg)  BMI 26.97 kg/m2  Tobacco History  Smoking status  . Never Smoker   Smokeless tobacco  . Never Used     Counseling given: Not Answered   Past Medical History  Diagnosis Date  . Hypertension     did not tolerate Lisinopril event at low dose  . Hyperlipidemia   . Anemia, mild   . Vitamin B12 deficiency   . Prostate cancer ([place])     Dr Karsten Ro  . CAD (coronary artery disease)     a. 02/2010 NSTEMI/PCI: PROMUS DES to mRCA. b. 06/05/14 Cath/PCI: pLAD 70%--> s/p PCI/DES (Promus DES);  c. 01/2015 Cath/PCI: LM nl, LAD 56m, patent prox stent, LCX patent prox stent, RCA 30p, 61m (3.5x20 Synergy DES).  . CKD (  chronic kidney disease), stage III   . Diastolic dysfunction     a. Echo 6/11: mild LVH, EF 55-60%, GR1DD, Trivial MR, mild LAE. b. echo 06/04/14: EF 55-60%, no WMA, GR1DD, Ao valve mildly calcifed, mild MR, mild LAE  . Melanoma ([place])     a. 1987. b. 2012  . History of pulmonary embolism   . TIA (transient ischemic attack)     a. post cath and PCI on 06/05/2014 - no residual sequelae.   Past Surgical History  Procedure Laterality Date  . Cholecystectomy    . Prostatectomy      Dr. Karsten Ro  . Knee arthroscopy      right  . Tonsillectomy    . Mrca stent  02/2010  . Melanoma excision  2011     L chest  . Melanoma excision   1991    back  . Left heart catheterization with coronary angiogram N/A 06/05/2014    Procedure: LEFT HEART CATHETERIZATION WITH CORONARY ANGIOGRAM;  Surgeon: Blane Ohara, MD;  Location: [facility] CATH LAB;  Service: Cardiovascular;  Laterality: N/A;  . Left heart catheterization with coronary angiogram N/A 01/19/2015    Procedure: LEFT HEART CATHETERIZATION WITH CORONARY ANGIOGRAM;  Surgeon: Peter M Martinique, MD;  Location: [facility] CATH LAB;  Service: Cardiovascular;  Laterality: N/A;  . Colonoscopy  2006   Family History  Problem Relation Age of Onset  . Coronary artery disease Mother     S/P CABG, in her 70s  . Transient ischemic attack Mother 56  . Prostate cancer Father   . Hypertension Brother   . Diabetes Sister    History  Sexual Activity  . Sexual Activity: Not on file    Outpatient Encounter Prescriptions as of 11/05/2015  Medication Sig  . amLODipine (NORVASC) 5 MG tablet TAKE 1 TABLET (5 MG TOTAL) BY MOUTH DAILY.  Marland Kitchen aspirin 81 MG tablet Take 81 mg by mouth 2 (two) times daily.   Marland Kitchen atorvastatin (LIPITOR) 20 MG tablet TAKE 1 TABLET BY MOUTH DAILY  . calcium carbonate (TUMS - DOSED IN MG ELEMENTAL CALCIUM) 500 MG chewable tablet Chew 2 tablets by mouth daily as needed for indigestion or heartburn.   . clopidogrel (PLAVIX) 75 MG tablet TAKE 1 TABLET EVERY DAY  . Cyanocobalamin (VITAMIN B12 PO) Take 1 tablet by mouth every evening.  . fish oil-omega-3 fatty acids 1000 MG capsule Take 2 g by mouth 2 (two) times daily.    . metoprolol (LOPRESSOR) 50 MG tablet TAKE 1 TABLET BY MOUTH TWICE A DAY  . nitroGLYCERIN (NITROSTAT) 0.4 MG SL tablet Place 1 tablet (0.4 mg total) under the tongue every 5 (five) minutes as needed for chest pain.  Marland Kitchen desonide (DESOWEN) 0.05 % cream Apply topically 2 (two) times daily. (Patient not taking: Reported on 11/05/2015)  . [DISCONTINUED] dicloxacillin (DYNAPEN) 500 MG capsule Take 1 capsule (500 mg total) by mouth every 6 (six) hours.   No facility-administered  encounter medications on file as of 11/05/2015.    Activities of Daily Living In your present state of health, do you have any difficulty performing the following activities: 11/05/2015 01/18/2015  Hearing? N -  Vision? N -  Difficulty concentrating or making decisions? N -  Walking or climbing stairs? N -  Dressing or bathing? N -  Doing errands, shopping? N N  Preparing Food and eating ? N -  Using the Toilet? N -  In the past six months, have you accidently leaked urine? N -  Do  you have problems with loss of bowel control? N -  Managing your Medications? N -  Managing your Finances? N -  Housekeeping or managing your Housekeeping? N -    Patient Care Team: Hendricks Limes, MD as PCP - General   Assessment:     Assessment   Patient presents for yearly preventative medicine examination. Medicare questionnaire screening were completed, i.e. 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   Education provided for laboratory screens;  Reviewed all of his labs and understands that he will need to determine fup care to have labs ordered here; Educated on CKD but noted GRR 49.49 which his last GFR was lower; will fup with nurse practitioner here but decided to go home and discuss with wife;   Medication reconciliation, past medical history, social history, problem list and allergies were reviewed in detail with the patient/ compliant  Goals were established to maintain health; get apt for fup labs and overall care;  End of life planning was discussed and did take a copy of AD from [facility] with instructions regarding how to complete   Exercise Activities and Dietary recommendations Current Exercise Habits:: Home exercise routine (works outside all day), Intensity:  Moderate  Goals    None     Fall Risk Fall Risk  11/05/2015 10/03/2014 07/28/2013  Falls in the past year? No No No   Depression Screen PHQ 2/9 Scores 11/05/2015 10/03/2014 07/28/2013  PHQ - 2 Score 0 0 0    Cognitive Testing MMSE - Mini Mental State Exam 11/05/2015  Not completed: Refused  Ad8 Score 0; Education provided on memory issues.   Immunization History  Administered Date(s) Administered  . Td 06/14/2010   Screening Tests Health Maintenance  Topic Date Due  . Hepatitis C Screening  1945/11/09  . ZOSTAVAX  04/26/2004  . PNA vac Low Risk Adult (1 of 2 - PCV13) 04/26/2009  . COLONOSCOPY  11/19/2014  . INFLUENZA VACCINE  01/04/2016 (Originally 05/07/2015)  . TETANUS/TDAP  06/14/2020      Plan:    During the course of the visit the patient was educated and counseled about the following appropriate screening and preventive services:   Vaccines to include Pneumoccal, Influenza, Hepatitis B, Td, Zostavax, HCV/ declines  Electrocardiogram/ deferred to cardiology   Cardiovascular Disease/ bP in good control;   Colorectal cancer screening/ TBD   Diabetes screening/ A1c was 5.5  Prostate Cancer Screening/ TBS  Glaucoma screening/ To schedule eye exam  Nutrition counseling / discussed health eating as access to garden and home grown vegetables ; Eats only venison if eating red meat.   Smoking cessation counseling n/a  Patient Instructions (the written plan) was given to the patient.    [id number], RN  11/05/2015   Medical screening examination/treatment/procedure(s) were performed by non-physician practitioner and as supervising provider I was immediately available for consultation/collaboration. I agree with above. Mauricio Po, FNP

## 2015-11-12 DIAGNOSIS — R69 Illness, unspecified: Secondary | ICD-10-CM | POA: Diagnosis not present

## 2015-11-20 ENCOUNTER — Encounter: Payer: Self-pay | Admitting: Gastroenterology

## 2015-11-30 DIAGNOSIS — L814 Other melanin hyperpigmentation: Secondary | ICD-10-CM | POA: Diagnosis not present

## 2015-11-30 DIAGNOSIS — Z85820 Personal history of malignant melanoma of skin: Secondary | ICD-10-CM | POA: Diagnosis not present

## 2015-11-30 DIAGNOSIS — L280 Lichen simplex chronicus: Secondary | ICD-10-CM | POA: Diagnosis not present

## 2015-11-30 DIAGNOSIS — L570 Actinic keratosis: Secondary | ICD-10-CM | POA: Diagnosis not present

## 2015-11-30 DIAGNOSIS — D1801 Hemangioma of skin and subcutaneous tissue: Secondary | ICD-10-CM | POA: Diagnosis not present

## 2015-11-30 DIAGNOSIS — D225 Melanocytic nevi of trunk: Secondary | ICD-10-CM | POA: Diagnosis not present

## 2015-11-30 DIAGNOSIS — I788 Other diseases of capillaries: Secondary | ICD-10-CM | POA: Diagnosis not present

## 2015-11-30 DIAGNOSIS — L821 Other seborrheic keratosis: Secondary | ICD-10-CM | POA: Diagnosis not present

## 2016-01-08 ENCOUNTER — Other Ambulatory Visit: Payer: Self-pay | Admitting: Cardiology

## 2016-02-05 ENCOUNTER — Other Ambulatory Visit: Payer: Self-pay | Admitting: Cardiology

## 2016-02-06 ENCOUNTER — Ambulatory Visit (INDEPENDENT_AMBULATORY_CARE_PROVIDER_SITE_OTHER): Payer: Medicare HMO | Admitting: Cardiology

## 2016-02-06 ENCOUNTER — Encounter: Payer: Self-pay | Admitting: Cardiology

## 2016-02-06 VITALS — BP 134/78 | HR 66 | Ht 76.0 in | Wt 219.0 lb

## 2016-02-06 DIAGNOSIS — Z955 Presence of coronary angioplasty implant and graft: Secondary | ICD-10-CM

## 2016-02-06 DIAGNOSIS — E782 Mixed hyperlipidemia: Secondary | ICD-10-CM

## 2016-02-06 DIAGNOSIS — I2510 Atherosclerotic heart disease of native coronary artery without angina pectoris: Secondary | ICD-10-CM

## 2016-02-06 DIAGNOSIS — N183 Chronic kidney disease, stage 3 unspecified: Secondary | ICD-10-CM

## 2016-02-06 NOTE — Patient Instructions (Signed)
Medication Instructions:  Your physician recommends that you continue on your current medications as directed. Please refer to the Current Medication list given to you today.   Labwork: Your physician recommends that you return for a FASTING lipomed profile/BMET/PSA 02/07/16. DO NOT EAT OR DRINK AFTER MIDNIGHT THE NIGHT BEFORE THE LAB. The lab is open from 7:30AM-5PM.   Testing/Procedures: None  Follow-Up: Your physician wants you to follow-up in: 1 year with Dr Aundra Dubin. (May 2018).  You will receive a reminder letter in the mail two months in advance. If you don't receive a letter, please call our office to schedule the follow-up appointment.    Any Other Special Instructions Will Be Listed Below (If Applicable).  You have been referred to [facility] to establish with Dr Lorelei Pont or Dr Danise Mina for your primary care.  If you need a refill on your cardiac medications before your next appointment, please call your pharmacy.

## 2016-02-07 ENCOUNTER — Other Ambulatory Visit: Payer: Medicare HMO

## 2016-02-07 ENCOUNTER — Other Ambulatory Visit (INDEPENDENT_AMBULATORY_CARE_PROVIDER_SITE_OTHER): Payer: Medicare HMO | Admitting: *Deleted

## 2016-02-07 DIAGNOSIS — I10 Essential (primary) hypertension: Secondary | ICD-10-CM | POA: Diagnosis not present

## 2016-02-07 DIAGNOSIS — I2510 Atherosclerotic heart disease of native coronary artery without angina pectoris: Secondary | ICD-10-CM | POA: Diagnosis not present

## 2016-02-07 DIAGNOSIS — E782 Mixed hyperlipidemia: Secondary | ICD-10-CM | POA: Diagnosis not present

## 2016-02-07 DIAGNOSIS — Z125 Encounter for screening for malignant neoplasm of prostate: Secondary | ICD-10-CM | POA: Diagnosis not present

## 2016-02-07 LAB — BASIC METABOLIC PANEL
BUN: 18 mg/dL (ref 7–25)
CHLORIDE: 105 mmol/L (ref 98–110)
CO2: 25 mmol/L (ref 20–31)
Calcium: 9.3 mg/dL (ref 8.6–10.3)
Creat: 1.53 mg/dL — ABNORMAL HIGH (ref 0.70–1.18)
Glucose, Bld: 100 mg/dL — ABNORMAL HIGH (ref 65–99)
POTASSIUM: 4.8 mmol/L (ref 3.5–5.3)
Sodium: 141 mmol/L (ref 135–146)

## 2016-02-07 NOTE — Addendum Note (Signed)
Addended by: Eulis Foster on: 02/07/2016 07:44 AM   Modules accepted: Orders

## 2016-02-07 NOTE — Progress Notes (Signed)
Patient ID: [name], male   DOB: 1946/04/30, 70 y.o.   MRN: [id number] PCP: Dr. Linna Darner  70 yo with history of CAD s/p NSTEMI in 5/11 and again in 8/15 presents for followup.  Patient was admitted to [facility] in 5/11 with NSTEMI.  He had a PROMUS DES to the mid LCx.  He had residual moderate disease (60-70%) in the LAD that was not intervened upon.   EF was preserved on echo.  He had recurrent NSTEMI in 8/15 with DES to LAD, RCA had moderate lesion that was not significant by FFR.  EF was again preserved on echo. He had a TIA post-cath.  He had unstable angina in 4/16 with DES to [facility].  Since 4/16, [name] has done well.  No exertional chest pain or dyspnea.  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  He is able to do everything he wants to do without exercise intolerance.    Labs (5/11): creatinine 1.56, TGs 478, HDL 35, P2Y12 testing with PRU < 230 (goal) Labs (8/11): LDL 69, HDL 35, TGs 284, creatinine 1.5, TSH normal Labs (1/12): LDL 76, HDL 37, TGs 287 Labs (10/13): creatinine 1.6, LDL 69, HDL 33 Labs (9/15): K 4.1, creatinine 1.49 Labs (10/15): HDL 34, LDL 62, TGs 142 Labs (11/16): K 4.8, creatinine 1.58, LDL 73, HDL 39, HCT 40.6  ECG: NSR, normal  Allergies:  1)  ! * Hydroxyzine 2)  Lidocaine  Past Medical History: 1. Hypertension: Did not tolerate lisinopril even at low dose.  2. Hyperlipidemia 3. Mild anemia, B12 deficiency,PMH of 4. Prostate cancer, hx of 5. Skin cancer, PMH  of 6. S/P  TKR 7. CAD: NSTEMI 5/11.  LHC showed 60% prox LAD, 70% mid LAD, 95% mid LCx with TIMI 2 flow distally, EF 60%.  Patient had PROMUS DES to mLCx.  NSTEMI (8/15), LHC showed 70% prox/50% mLAD, 75% ostial D1, 50-60% mRCA.  LAD stenosis significant by FFR, RCA stenosis was not.  Patient had DES to LAD.  Echo (8/15) with EF 55-60%, mild Brian.   - Unstable angina 4/16.  LHC showed 50% mLAD, 90% mRCA.  He had DES to [facility].  8. CKD 9. TIA post-PCI in 8/15.   Family History: mother  :CAD S/P  CABG  in 70s, CVA father: prostate cancer; bro: HTN  Social History: Never Smoked Retired from [employer], works on his farm in [place] Alcohol use-no Regular exercise: physically active on farm Married  ROS: All systems reviewed and negative except as per HPI.   Current Outpatient Prescriptions  Medication Sig Dispense Refill  . amLODipine (NORVASC) 5 MG tablet TAKE 1 TABLET (5 MG TOTAL) BY MOUTH DAILY. 30 tablet 0  . aspirin 81 MG tablet Take 81 mg by mouth 2 (two) times daily.     Marland Kitchen atorvastatin (LIPITOR) 20 MG tablet TAKE 1 TABLET BY MOUTH DAILY 90 tablet 1  . calcium carbonate (TUMS - DOSED IN MG ELEMENTAL CALCIUM) 500 MG chewable tablet Chew 2 tablets by mouth daily as needed for indigestion or heartburn.     . clopidogrel (PLAVIX) 75 MG tablet TAKE 1 TABLET EVERY DAY 90 tablet 1  . Cyanocobalamin (VITAMIN B12 PO) Take 1 tablet by mouth every evening.    . fish oil-omega-3 fatty acids 1000 MG capsule Take 2 g by mouth 2 (two) times daily.      . metoprolol (LOPRESSOR) 50 MG tablet TAKE 1 TABLET BY MOUTH TWICE A DAY  180 tablet 1  . nitroGLYCERIN (NITROSTAT) 0.4 MG SL tablet Place 1 tablet (0.4 mg total) under the tongue every 5 (five) minutes as needed for chest pain. 30 tablet 3   No current facility-administered medications for this visit.    BP 134/78 mmHg  Pulse 66  Ht 6\' 4"  (1.93 m)  Wt 219 lb (99.338 kg)  BMI 26.67 kg/m2 General:  Well developed, well nourished, in no acute distress. Neck:  Neck supple, no JVD. No masses, thyromegaly or abnormal cervical nodes. Lungs:  Clear bilaterally to auscultation and percussion. Heart:  Non-displaced PMI, chest non-tender; regular rate and rhythm, S1, S2 without murmurs, rubs. Soft S4. Carotid upstroke normal, no bruit. Pedals normal pulses. No edema, no varicosities. Abdomen:  Bowel sounds positive; abdomen soft and non-tender without masses, organomegaly, or hernias noted. No  hepatosplenomegaly. Extremities:  No clubbing or cyanosis. Neurologic:  Alert and oriented x 3. Psych:  Normal affect.  Assessment/Plan: 1. CAD: No ischemic symptoms.  Continue ASA 81, statin, Plavix.  Now that he has 3 stents and given no bleeding history, I will continue his Plavix long-term. . 2. HTN: BP controlled on current regimen.    3. Hyperlipidemia: LDL has been reasonably controlled, but nonetheless, he had another event.  I will get a Lipomed profile.  4. CKD: BMET today.   5. TIA: Occurred post-PCI in 8/15.     Loralie Champagne 02/07/2016

## 2016-02-08 LAB — PSA: PSA: <0.01 ng/mL (ref <=4.00)

## 2016-02-11 LAB — CARDIO IQ(R) ADVANCED LIPID PANEL
Apolipoprotein B: 62 mg/dL (ref 52–109)
Cholesterol, Total: 122 mg/dL — ABNORMAL LOW (ref 125–200)
Cholesterol/HDL Ratio: 3.2 calc (ref <=5.0)
HDL CHOLESTEROL (CARDIO IQ ADV LIPID PANEL): 38 mg/dL — AB (ref >=40)
LDL CHOLESTEROL CALCULATED (CARDIO IQ ADV LIPID PANEL): 61 mg/dL
LDL LARGE: 4930 nmol/L (ref 4334–10815)
LDL Medium: 127 nmol/L — ABNORMAL LOW (ref 167–465)
LDL Particle Number: 924 nmol/L — ABNORMAL LOW (ref 1016–2185)
LDL Peak Size: 207.4 Angstrom — ABNORMAL LOW (ref >=218.2)
LDL SMALL: 182 nmol/L (ref 123–441)
LIPOPROTEIN (A) (CARDIO IQ ADV LIPID PANEL): 159 nmol/L — AB (ref <75)
Non-HDL Cholesterol: 84 mg/dL
Triglycerides: 113 mg/dL

## 2016-02-13 ENCOUNTER — Other Ambulatory Visit: Payer: Self-pay | Admitting: Cardiology

## 2016-04-05 ENCOUNTER — Telehealth: Payer: Self-pay

## 2016-04-05 NOTE — Telephone Encounter (Signed)
Reviewing pt chart and he does not have an appt to est with new PCP.   Can you contact regarding same and let me know if/when pt scheds?

## 2016-04-14 DIAGNOSIS — R69 Illness, unspecified: Secondary | ICD-10-CM | POA: Diagnosis not present

## 2016-04-22 NOTE — Telephone Encounter (Signed)
Scheduled 08/04/2016 9 am

## 2016-07-08 DIAGNOSIS — R69 Illness, unspecified: Secondary | ICD-10-CM | POA: Diagnosis not present

## 2016-08-04 ENCOUNTER — Encounter: Payer: Self-pay | Admitting: Internal Medicine

## 2016-08-04 ENCOUNTER — Ambulatory Visit (INDEPENDENT_AMBULATORY_CARE_PROVIDER_SITE_OTHER): Payer: Medicare HMO | Admitting: Internal Medicine

## 2016-08-04 ENCOUNTER — Other Ambulatory Visit (INDEPENDENT_AMBULATORY_CARE_PROVIDER_SITE_OTHER): Payer: Medicare HMO

## 2016-08-04 VITALS — BP 140/70 | HR 60 | Temp 98.3°F | Resp 16 | Ht 76.0 in | Wt 219.0 lb

## 2016-08-04 DIAGNOSIS — Z0000 Encounter for general adult medical examination without abnormal findings: Secondary | ICD-10-CM | POA: Diagnosis not present

## 2016-08-04 DIAGNOSIS — C4359 Malignant melanoma of other part of trunk: Secondary | ICD-10-CM | POA: Diagnosis not present

## 2016-08-04 DIAGNOSIS — R7989 Other specified abnormal findings of blood chemistry: Secondary | ICD-10-CM

## 2016-08-04 DIAGNOSIS — R739 Hyperglycemia, unspecified: Secondary | ICD-10-CM

## 2016-08-04 DIAGNOSIS — Z8546 Personal history of malignant neoplasm of prostate: Secondary | ICD-10-CM

## 2016-08-04 DIAGNOSIS — R946 Abnormal results of thyroid function studies: Secondary | ICD-10-CM

## 2016-08-04 DIAGNOSIS — E782 Mixed hyperlipidemia: Secondary | ICD-10-CM

## 2016-08-04 DIAGNOSIS — I10 Essential (primary) hypertension: Secondary | ICD-10-CM

## 2016-08-04 DIAGNOSIS — N183 Chronic kidney disease, stage 3 unspecified: Secondary | ICD-10-CM

## 2016-08-04 DIAGNOSIS — N5231 Erectile dysfunction following radical prostatectomy: Secondary | ICD-10-CM

## 2016-08-04 DIAGNOSIS — N529 Male erectile dysfunction, unspecified: Secondary | ICD-10-CM | POA: Insufficient documentation

## 2016-08-04 DIAGNOSIS — Z1159 Encounter for screening for other viral diseases: Secondary | ICD-10-CM | POA: Diagnosis not present

## 2016-08-04 LAB — CBC WITH DIFFERENTIAL/PLATELET
BASOS ABS: 0 10*3/uL (ref 0.0–0.1)
Basophils Relative: 0.6 % (ref 0.0–3.0)
EOS ABS: 0.3 10*3/uL (ref 0.0–0.7)
Eosinophils Relative: 5.9 % — ABNORMAL HIGH (ref 0.0–5.0)
HEMATOCRIT: 40.3 % (ref 39.0–52.0)
Hemoglobin: 14 g/dL (ref 13.0–17.0)
LYMPHS PCT: 26.5 % (ref 12.0–46.0)
Lymphs Abs: 1.3 10*3/uL (ref 0.7–4.0)
MCHC: 34.8 g/dL (ref 30.0–36.0)
MCV: 82.7 fl (ref 78.0–100.0)
MONOS PCT: 7.1 % (ref 3.0–12.0)
Monocytes Absolute: 0.4 10*3/uL (ref 0.1–1.0)
NEUTROS ABS: 2.9 10*3/uL (ref 1.4–7.7)
NEUTROS PCT: 59.9 % (ref 43.0–77.0)
PLATELETS: 164 10*3/uL (ref 150.0–400.0)
RBC: 4.88 Mil/uL (ref 4.22–5.81)
RDW: 14.6 % (ref 11.5–15.5)
WBC: 4.9 10*3/uL (ref 4.0–10.5)

## 2016-08-04 LAB — LIPID PANEL
CHOL/HDL RATIO: 3
Cholesterol: 127 mg/dL (ref 0–200)
HDL: 43 mg/dL (ref >39.00)
LDL CALC: 56 mg/dL (ref 0–99)
NONHDL: 83.72
Triglycerides: 137 mg/dL (ref 0.0–149.0)
VLDL: 27.4 mg/dL (ref 0.0–40.0)

## 2016-08-04 LAB — COMPREHENSIVE METABOLIC PANEL
ALK PHOS: 72 U/L (ref 39–117)
ALT: 18 U/L (ref 0–53)
AST: 19 U/L (ref 0–37)
Albumin: 4.7 g/dL (ref 3.5–5.2)
BILIRUBIN TOTAL: 0.6 mg/dL (ref 0.2–1.2)
BUN: 18 mg/dL (ref 6–23)
CALCIUM: 9.6 mg/dL (ref 8.4–10.5)
CO2: 26 meq/L (ref 19–32)
CREATININE: 1.49 mg/dL (ref 0.40–1.50)
Chloride: 104 mEq/L (ref 96–112)
GFR: 49.24 mL/min — AB (ref >60.00)
GLUCOSE: 98 mg/dL (ref 70–99)
Potassium: 4.2 mEq/L (ref 3.5–5.1)
Sodium: 140 mEq/L (ref 135–145)
TOTAL PROTEIN: 7.1 g/dL (ref 6.0–8.3)

## 2016-08-04 LAB — TSH: TSH: 3.59 u[IU]/mL (ref 0.35–4.50)

## 2016-08-04 LAB — PSA, MEDICARE: PSA: 0 ng/mL — AB (ref 0.10–4.00)

## 2016-08-04 LAB — HEPATITIS C ANTIBODY: HCV AB: NEGATIVE

## 2016-08-04 LAB — HEMOGLOBIN A1C: Hgb A1c MFr Bld: 5.4 % (ref 4.6–6.5)

## 2016-08-04 MED ORDER — TADALAFIL 10 MG PO TABS: 10.0000 mg | ORAL_TABLET | Freq: Every day | ORAL | 5 refills | Status: DC | PRN

## 2016-08-04 NOTE — Assessment & Plan Note (Signed)
Check TSH 

## 2016-08-04 NOTE — Assessment & Plan Note (Signed)
Sees Dr Ubaldo Glassing every 6 months

## 2016-08-04 NOTE — Assessment & Plan Note (Addendum)
Not currently seeing urology Monitor psa

## 2016-08-04 NOTE — Assessment & Plan Note (Signed)
Check lipid panel, CMP Taking atorvastatin 20 mg daily

## 2016-08-04 NOTE — Progress Notes (Signed)
Subjective:    Patient ID: [name], male    DOB: 1946-05-10, 70 y.o.   MRN: [id number]  HPI Here for medicare wellness exam and physical exam.   His left hand has been going numb at night for two weeks.  It can happen during the day when he talks on his phone.  His 2-3 fingers go numb.  He does get discomfort when he does work outside.  He denies weakness in the hand.  His fingers feel swollen when he grips his hand.   I have personally reviewed and have noted 1.The patient's medical and social history 2.Their use of alcohol, tobacco or illicit drugs 3.Their current medications and supplements 4.The patient's functional ability including ADL's, fall risks, home safety risks and                 hearing or visual impairment. 5.Diet and physical activities 6.Evidence for depression or mood disorders 7.Care team reviewed - cardiology - Dr Aundra Dubin, [facility] - Dr Ubaldo Glassing,    Are there smokers in your home (other than you)? No  Risk Factors Exercise: active - hunt, fish, split wood, no other exercise Dietary issues discussed: lots of veges, avoids sugars and fried foods, grills meat  Cardiac risk factors: advanced age, hypertension, hyperlipidemia  Depression Screen  Have you felt down, depressed or hopeless? No  Have you felt little interest or pleasure in doing things?  No  Activities of Daily Living In your present state of health, do you have any difficulty performing the following activities?:  Driving? No Managing money?  No Feeding yourself? No Getting from bed to chair? No Climbing a flight of stairs? No Preparing food and eating?: No Bathing or showering? No Getting dressed: No Getting to/using the toilet? No Moving around from place to place: No In the past year have you fallen or had a near fall?: No   Are you sexually active? Yes  Do you have more than one partner?  No  Hearing Difficulties: No Do  you often ask people to speak up or repeat themselves? No Do you experience ringing or noises in your ears? No Do you have difficulty understanding soft or whispered voices? No Vision:              Any change in vision:  No             Up to date with eye exam:  - overdue - will schedule Memory:  Do you feel that you have a problem with memory? Yes, recall difficulties and goes into a room and forgets why he is in there  Do you often misplace items? No  Do you feel safe at home?  Yes  Cognitive Testing  Alert, Orientated? Yes  Normal Appearance? Yes  Recall of three objects?  Yes  Can perform simple calculations? Yes  Displays appropriate judgment? Yes  Can read the correct time from a watch face? Yes   Advanced Directives have been discussed with the patient? Yes   Medications and allergies reviewed with patient and updated if appropriate.  Patient Active Problem List   Diagnosis Date Noted  . Erectile dysfunction 08/04/2016  . Elevated TSH 08/29/2015  . S/P coronary artery stent placement, 01/19/15 S/p DES of mid RCA 01/20/2015  . Unstable angina ([place])   . Hyperglycemia 10/03/2014  . TIA (transient ischemic attack) 07/31/2014  . CVA (cerebral infarction) 06/05/2014  . NSTEMI (non-ST elevated myocardial infarction) ([place]) 06/04/2014  . Chronic kidney  disease (CKD), stage III (moderate) 06/14/2010  . CORONARY ATHEROSCLEROSIS NATIVE CORONARY ARTERY 03/01/2010  . PERNICIOUS ANEMIA 04/24/2009  . TREMOR, ESSENTIAL, RIGHT HAND 04/24/2009  . PROSTATE CANCER, HX OF 04/24/2009  . Melanoma ([place]) 04/24/2009  . Mixed hyperlipidemia 10/28/2007  . Essential hypertension 10/13/2007    Current Outpatient Prescriptions on File Prior to Visit  Medication Sig Dispense Refill  . amLODipine (NORVASC) 5 MG tablet TAKE 1 TABLET BY MOUTH EVERY DAY 30 tablet 11  . aspirin 81 MG tablet Take 81 mg by mouth 2 (two) times daily.     Marland Kitchen atorvastatin (LIPITOR) 20 MG tablet TAKE 1 TABLET BY MOUTH DAILY  90 tablet 3  . calcium carbonate (TUMS - DOSED IN MG ELEMENTAL CALCIUM) 500 MG chewable tablet Chew 2 tablets by mouth daily as needed for indigestion or heartburn.     . clopidogrel (PLAVIX) 75 MG tablet TAKE 1 TABLET EVERY DAY 90 tablet 3  . Cyanocobalamin (VITAMIN B12 PO) Take 1 tablet by mouth every evening.    . fish oil-omega-3 fatty acids 1000 MG capsule Take 2 g by mouth 2 (two) times daily.      . metoprolol (LOPRESSOR) 50 MG tablet TAKE 1 TABLET BY MOUTH TWICE A DAY 180 tablet 3  . nitroGLYCERIN (NITROSTAT) 0.4 MG SL tablet Place 1 tablet (0.4 mg total) under the tongue every 5 (five) minutes as needed for chest pain. 30 tablet 3   No current facility-administered medications on file prior to visit.     Past Medical History:  Diagnosis Date  . Anemia, mild   . CAD (coronary artery disease)    a. 02/2010 NSTEMI/PCI: PROMUS DES to mRCA. b. 06/05/14 Cath/PCI: pLAD 70%--> s/p PCI/DES (Promus DES);  c. 01/2015 Cath/PCI: LM nl, LAD 78m, patent prox stent, LCX patent prox stent, RCA 30p, 82m (3.5x20 Synergy DES).  . CKD (chronic kidney disease), stage III   . Diastolic dysfunction    a. Echo 6/11: mild LVH, EF 55-60%, GR1DD, Trivial MR, mild LAE. b. echo 06/04/14: EF 55-60%, no WMA, GR1DD, Ao valve mildly calcifed, mild MR, mild LAE  . History of pulmonary embolism   . Hyperlipidemia   . Hypertension    did not tolerate Lisinopril event at low dose  . Melanoma ([place])    a. 1987. b. 2012  . Prostate cancer ([place])    Dr Karsten Ro  . TIA (transient ischemic attack)    a. post cath and PCI on 06/05/2014 - no residual sequelae.  . Vitamin B12 deficiency     Past Surgical History:  Procedure Laterality Date  . CHOLECYSTECTOMY    . COLONOSCOPY  2006  . KNEE ARTHROSCOPY     right  . LEFT HEART CATHETERIZATION WITH CORONARY ANGIOGRAM N/A 06/05/2014   Procedure: LEFT HEART CATHETERIZATION WITH CORONARY ANGIOGRAM;  Surgeon: Blane Ohara, MD;  Location: [facility] C[facility] LAB;  Service: Cardiovascular;   Laterality: N/A;  . LEFT HEART CATHETERIZATION WITH CORONARY ANGIOGRAM N/A 01/19/2015   Procedure: LEFT HEART CATHETERIZATION WITH CORONARY ANGIOGRAM;  Surgeon: Peter M Martinique, MD;  Location: [facility] CATH LAB;  Service: Cardiovascular;  Laterality: N/A;  . MELANOMA EXCISION  2011    L chest  . MELANOMA EXCISION  1991   back  . mRCA stent  02/2010  . PROSTATECTOMY     Dr. Karsten Ro  . TONSILLECTOMY      Social History   Social History  . Marital status: Married    Spouse name: N/A  . Number of children:  N/A  . Years of education: N/A   Social History Main Topics  . Smoking status: Never Smoker  . Smokeless tobacco: Never Used  . Alcohol use No  . Drug use: No  . Sexual activity: Not on file   Other Topics Concern  . Not on file   Social History Narrative  . No narrative on file    Family History  Problem Relation Age of Onset  . Coronary artery disease Mother     S/P CABG, in her 48s  . Transient ischemic attack Mother 24  . Prostate cancer Father   . Hypertension Brother   . Diabetes Sister     Review of Systems  Constitutional: Negative for appetite change, chills, fatigue and fever.  HENT: Negative for hearing loss and tinnitus.   Eyes: Negative for visual disturbance.  Respiratory: Negative for cough, shortness of breath and wheezing.   Cardiovascular: Positive for chest pain (only when sleeping -changes position and goes away). Negative for palpitations and leg swelling.  Gastrointestinal: Positive for constipation. Negative for abdominal pain, blood in stool, diarrhea and nausea.       No gerd  Genitourinary: Negative for difficulty urinating, dysuria and hematuria.  Musculoskeletal: Negative for arthralgias, back pain and myalgias.  Skin: Positive for rash (right lower leg - has seen derm). Negative for color change.  Neurological: Negative for light-headedness and headaches.  Psychiatric/Behavioral: Negative for dysphoric mood. The patient is not  nervous/anxious.        Objective:   Vitals:   08/04/16 0833  BP: 140/70  Pulse: 60  Resp: 16  Temp: 98.3 F (36.8 C)   Filed Weights   08/04/16 0833  Weight: 219 lb (99.3 kg)   Body mass index is 26.66 kg/m.   Physical Exam Constitutional: He appears well-developed and well-nourished. No distress.  HENT:  Head: Normocephalic and atraumatic.  Right Ear: External ear normal.  Left Ear: External ear normal.  Mouth/Throat: Oropharynx is clear and moist.  Normal ear canals and TM b/l  Eyes: Conjunctivae and EOM are normal.  Neck: Neck supple. No tracheal deviation present. No thyromegaly present.  No carotid bruit  Cardiovascular: Normal rate, regular rhythm, normal heart sounds and intact distal pulses.   No murmur heard. Pulmonary/Chest: Effort normal and breath sounds normal. No respiratory distress. He has no wheezes. He has no rales.  Abdominal: Soft. Bowel sounds are normal. He exhibits no distension. There is no tenderness.  Genitourinary: deferred  Musculoskeletal: He exhibits no edema.  Lymphadenopathy:    He has no cervical adenopathy.  Skin: Skin is warm and dry. He is not diaphoretic.  Psychiatric: He has a normal mood and affect. His behavior is normal.       Assessment & Plan:    Wellness Exam: Immunizations - deferred influenza, deferred pneumonia, discussed shingles vaccine Colonoscopy-due-he is reluctant to have a colonoscopy. Discussed colonoscopy and cologuard Eye exam - due, will schedule Hearing loss -  None Memory concerns/difficulties - some concerns since being in the hospital for heart disease Independent of ADLs - fully Stressed the importance of regular exercise   Patient received copy of preventative screening tests/immunizations recommended for the next 5-10 years.  Physical exam: Screening blood work - ordered Immunizations  - deferred influenza, deferred pneumonia, discussed shingles vaccine Colonoscopy-due-he is reluctant  to have a colonoscopy. Discussed colonoscopy and cologuard Eye exams - due - will schedule Exercise - very active, stressed importance of activity/exercise Weight - BMI just above normal, acceptable for  age Skin  - none Substance abuse - none  See Problem List for Assessment and Plan of chronic medical problems.

## 2016-08-04 NOTE — Assessment & Plan Note (Signed)
A1c

## 2016-08-04 NOTE — Assessment & Plan Note (Signed)
cmp

## 2016-08-04 NOTE — Assessment & Plan Note (Signed)
BP well controlled Current regimen effective and well tolerated Continue current medications at current doses cmp  

## 2016-08-04 NOTE — Patient Instructions (Signed)
[name] , Thank you for taking time to come for your Medicare Wellness Visit. I appreciate your ongoing commitment to your health goals. Please review the following plan we discussed and let me know if I can assist you in the future.   These are the goals we discussed: Goals    Think about the vaccines.  Consider a colonoscopy or cologuard      This is a list of the screening recommended for you and due dates:  Health Maintenance  Topic Date Due  .  Hepatitis C: One time screening is recommended by Center for Disease Control  (CDC) for  adults born from [birth year] through 1967.   [birth year]  . Shingles Vaccine  04/26/2004  . Colon Cancer Screening  04/24/71  . Pneumonia vaccines (1 of 2 - PCV13) 11/06/2016*  . Flu Shot  01/04/2017*  . Tetanus Vaccine  06/14/2020  *Topic was postponed. The date shown is not the original due date.     Test(s) ordered today. Your results will be released to Grace (or called to you) after review, usually within 72hours after test completion. If any changes need to be made, you will be notified at that same time.  All other Health Maintenance issues reviewed.   All recommended immunizations and age-appropriate screenings are up-to-date or discussed.  No immunizations administered today.   Medications reviewed and updated.  No changes recommended at this time.   Please followup in one year for a physical     Health Maintenance, Male A healthy lifestyle and preventative care can promote health and wellness.  Maintain regular health, dental, and eye exams.  Eat a healthy diet. Foods like vegetables, fruits, whole grains, low-fat dairy products, and lean protein foods contain the nutrients you need and are low in calories. Decrease your intake of foods high in solid fats, added sugars, and salt. Get information about a proper diet from your health care provider, if necessary.  Regular physical exercise is one of the most important things you can do  for your health. Most adults should get at least 150 minutes of moderate-intensity exercise (any activity that increases your heart rate and causes you to sweat) each week. In addition, most adults need muscle-strengthening exercises on 2 or more days a week.   Maintain a healthy weight. The body mass index (BMI) is a screening tool to identify possible weight problems. It provides an estimate of body fat based on height and weight. Your health care provider can find your BMI and can help you achieve or maintain a healthy weight. For males 20 years and older:  A BMI below 18.5 is considered underweight.  A BMI of 18.5 to 24.9 is normal.  A BMI of 25 to 29.9 is considered overweight.  A BMI of 30 and above is considered obese.  Maintain normal blood lipids and cholesterol by exercising and minimizing your intake of saturated fat. Eat a balanced diet with plenty of fruits and vegetables. Blood tests for lipids and cholesterol should begin at age 74 and be repeated every 5 years. If your lipid or cholesterol levels are high, you are over age 70, or you are at high risk for heart disease, you may need your cholesterol levels checked more frequently.Ongoing high lipid and cholesterol levels should be treated with medicines if diet and exercise are not working.  If you smoke, find out from your health care provider how to quit. If you do not use tobacco, do not start.  Lung  cancer screening is recommended for adults aged 59-80 years who are at high risk for developing lung cancer because of a history of smoking. A yearly low-dose CT scan of the lungs is recommended for people who have at least a 30-pack-year history of smoking and are current smokers or have quit within the past 15 years. A pack year of smoking is smoking an average of 1 pack of cigarettes a day for 1 year (for example, a 30-pack-year history of smoking could mean smoking 1 pack a day for 30 years or 2 packs a day for 15 years). Yearly  screening should continue until the smoker has stopped smoking for at least 15 years. Yearly screening should be stopped for people who develop a health problem that would prevent them from having lung cancer treatment.  If you choose to drink alcohol, do not have more than 2 drinks per day. One drink is considered to be 12 oz (360 mL) of beer, 5 oz (150 mL) of wine, or 1.5 oz (45 mL) of liquor.  Avoid the use of street drugs. Do not share needles with anyone. Ask for help if you need support or instructions about stopping the use of drugs.  High blood pressure causes heart disease and increases the risk of stroke. High blood pressure is more likely to develop in:  People who have blood pressure in the end of the normal range (100-139/85-89 mm Hg).  People who are overweight or obese.  People who are African American.  If you are 44-37 years of age, have your blood pressure checked every 3-5 years. If you are 72 years of age or older, have your blood pressure checked every year. You should have your blood pressure measured twice--once when you are at a hospital or clinic, and once when you are not at a hospital or clinic. Record the average of the two measurements. To check your blood pressure when you are not at a hospital or clinic, you can use:  An automated blood pressure machine at a pharmacy.  A home blood pressure monitor.  If you are 72-72 years old, ask your health care provider if you should take aspirin to prevent heart disease.  Diabetes screening involves taking a blood sample to check your fasting blood sugar level. This should be done once every 3 years after age 31 if you are at a normal weight and without risk factors for diabetes. Testing should be considered at a younger age or be carried out more frequently if you are overweight and have at least 1 risk factor for diabetes.  Colorectal cancer can be detected and often prevented. Most routine colorectal cancer screening  begins at the age of 72 and continues through age 72. However, your health care provider may recommend screening at an earlier age if you have risk factors for colon cancer. On a yearly basis, your health care provider may provide home test kits to check for hidden blood in the stool. A small camera at the end of a tube may be used to directly examine the colon (sigmoidoscopy or colonoscopy) to detect the earliest forms of colorectal cancer. Talk to your health care provider about this at age 72 when routine screening begins. A direct exam of the colon should be repeated every 5-10 years through age 71, unless early forms of precancerous polyps or small growths are found.  People who are at an increased risk for hepatitis B should be screened for this virus. You are considered at  high risk for hepatitis B if:  You were born in a country where hepatitis B occurs often. Talk with your health care provider about which countries are considered high risk.  Your parents were born in a high-risk country and you have not received a shot to protect against hepatitis B (hepatitis B vaccine).  You have HIV or AIDS.  You use needles to inject street drugs.  You live with, or have sex with, someone who has hepatitis B.  You are a man who has sex with other men (MSM).  You get hemodialysis treatment.  You take certain medicines for conditions like cancer, organ transplantation, and autoimmune conditions.  Hepatitis C blood testing is recommended for all people born from [birth year] through 1965 and any individual with known risk factors for hepatitis C.  Healthy men should no longer receive prostate-specific antigen (PSA) blood tests as part of routine cancer screening. Talk to your health care provider about prostate cancer screening.  Testicular cancer screening is not recommended for adolescents or adult males who have no symptoms. Screening includes self-exam, a health care provider exam, and other screening  tests. Consult with your health care provider about any symptoms you have or any concerns you have about testicular cancer.  Practice safe sex. Use condoms and avoid high-risk sexual practices to reduce the spread of sexually transmitted infections (STIs).  You should be screened for STIs, including gonorrhea and chlamydia if:  You are sexually active and are younger than 24 years.  You are older than 24 years, and your health care provider tells you that you are at risk for this type of infection.  Your sexual activity has changed since you were last screened, and you are at an increased risk for chlamydia or gonorrhea. Ask your health care provider if you are at risk.  If you are at risk of being infected with HIV, it is recommended that you take a prescription medicine daily to prevent HIV infection. This is called pre-exposure prophylaxis (PrEP). You are considered at risk if:  You are a man who has sex with other men (MSM).  You are a heterosexual man who is sexually active with multiple partners.  You take drugs by injection.  You are sexually active with a partner who has HIV.  Talk with your health care provider about whether you are at high risk of being infected with HIV. If you choose to begin PrEP, you should first be tested for HIV. You should then be tested every 3 months for as long as you are taking PrEP.  Use sunscreen. Apply sunscreen liberally and repeatedly throughout the day. You should seek shade when your shadow is shorter than you. Protect yourself by wearing long sleeves, pants, a wide-brimmed hat, and sunglasses year round whenever you are outdoors.  Tell your health care provider of new moles or changes in moles, especially if there is a change in shape or color. Also, tell your health care provider if a mole is larger than the size of a pencil eraser.  A one-time screening for abdominal aortic aneurysm (AAA) and surgical repair of large AAAs by ultrasound is  recommended for men aged 40-75 years who are current or former smokers.  Stay current with your vaccines (immunizations).   This information is not intended to replace advice given to you by your health care provider. Make sure you discuss any questions you have with your health care provider.   Document Released: 03/20/2008 Document Revised: 10/13/2014 Document Reviewed:  02/17/2011 Elsevier Interactive Patient Education [employer].

## 2016-08-04 NOTE — Progress Notes (Signed)
Pre visit review using our clinic review tool, if applicable. No additional management support is needed unless otherwise documented below in the visit note. 

## 2016-08-04 NOTE — Assessment & Plan Note (Signed)
Has viagra at home and will try it Will also give a prescription for cialis to try Does not use NTG

## 2016-08-08 DIAGNOSIS — D485 Neoplasm of uncertain behavior of skin: Secondary | ICD-10-CM | POA: Diagnosis not present

## 2016-08-08 DIAGNOSIS — L821 Other seborrheic keratosis: Secondary | ICD-10-CM | POA: Diagnosis not present

## 2016-08-08 DIAGNOSIS — D1801 Hemangioma of skin and subcutaneous tissue: Secondary | ICD-10-CM | POA: Diagnosis not present

## 2016-08-08 DIAGNOSIS — Z85820 Personal history of malignant melanoma of skin: Secondary | ICD-10-CM | POA: Diagnosis not present

## 2016-08-08 DIAGNOSIS — L988 Other specified disorders of the skin and subcutaneous tissue: Secondary | ICD-10-CM | POA: Diagnosis not present

## 2016-08-08 DIAGNOSIS — L570 Actinic keratosis: Secondary | ICD-10-CM | POA: Diagnosis not present

## 2016-08-08 DIAGNOSIS — D2272 Melanocytic nevi of left lower limb, including hip: Secondary | ICD-10-CM | POA: Diagnosis not present

## 2016-08-11 ENCOUNTER — Encounter: Payer: Self-pay | Admitting: Cardiology

## 2016-08-11 ENCOUNTER — Ambulatory Visit (INDEPENDENT_AMBULATORY_CARE_PROVIDER_SITE_OTHER): Payer: Medicare HMO | Admitting: Cardiology

## 2016-08-11 VITALS — BP 134/70 | HR 62 | Ht 76.0 in | Wt 218.0 lb

## 2016-08-11 DIAGNOSIS — I10 Essential (primary) hypertension: Secondary | ICD-10-CM

## 2016-08-11 DIAGNOSIS — I2510 Atherosclerotic heart disease of native coronary artery without angina pectoris: Secondary | ICD-10-CM | POA: Diagnosis not present

## 2016-08-11 DIAGNOSIS — E782 Mixed hyperlipidemia: Secondary | ICD-10-CM

## 2016-08-11 NOTE — Patient Instructions (Signed)
Medication Instructions:  Your physician recommends that you continue on your current medications as directed. Please refer to the Current Medication list given to you today.   Labwork: none  Testing/Procedures: none  Follow-Up: Your physician wants you to follow-up in: 1 year with Dr End (November 2018). You will receive a reminder letter in the mail two months in advance. If you don't receive a letter, please call our office to schedule the follow-up appointment.        If you need a refill on your cardiac medications before your next appointment, please call your pharmacy.

## 2016-08-12 NOTE — Progress Notes (Signed)
Patient ID: [name], male   DOB: [birth year], 70 y.o.   MRN: [id number] PCP: Dr. Quay Burow  70 yo with history of CAD s/p NSTEMI in 5/11 and again in 8/15 presents for followup.  Patient was admitted to [facility] in 5/11 with NSTEMI.  He had a PROMUS DES to the mid LCx.  He had residual moderate disease (60-70%) in the LAD that was not intervened upon.   EF was preserved on echo.  He had recurrent NSTEMI in 8/15 with DES to LAD, RCA had moderate lesion that was not significant by FFR.  EF was again preserved on echo. He had a TIA post-cath.  He had unstable angina in 4/16 with DES to [facility].  Since 4/16, [name] has done well.  No exertional chest pain or dyspnea.  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  He is able to do everything he wants to do without exercise intolerance.    Labs (5/11): creatinine 1.56, TGs 478, HDL 35, P2Y12 testing with PRU < 230 (goal) Labs (8/11): LDL 69, HDL 35, TGs 284, creatinine 1.5, TSH normal Labs (1/12): LDL 76, HDL 37, TGs 287 Labs (10/13): creatinine 1.6, LDL 69, HDL 33 Labs (9/15): K 4.1, creatinine 1.49 Labs (10/15): HDL 34, LDL 62, TGs 142 Labs (11/16): K 4.8, creatinine 1.58, LDL 73, HDL 39, HCT 40.6 Labs (5/17): LDL-P 924 Labs (10/17): LDL 56, HDL 43  ECG: NSR, normal  Allergies:  1)  ! * Hydroxyzine 2)  Lidocaine  Past Medical History: 1. Hypertension: Did not tolerate lisinopril even at low dose.  2. Hyperlipidemia 3. Mild anemia, B12 deficiency,PMH of 4. Prostate cancer, hx of 5. Skin cancer, PMH  of 6. S/P  TKR 7. CAD: NSTEMI 5/11.  LHC showed 60% prox LAD, 70% mid LAD, 95% mid LCx with TIMI 2 flow distally, EF 60%.  Patient had PROMUS DES to mLCx.  NSTEMI (8/15), LHC showed 70% prox/50% mLAD, 75% ostial D1, 50-60% mRCA.  LAD stenosis significant by FFR, RCA stenosis was not.  Patient had DES to LAD.  Echo (8/15) with EF 55-60%, mild Brian.   - Unstable angina 4/16.  LHC showed 50% mLAD, 90% mRCA.  He had DES to [facility].  8.  CKD 9. TIA post-PCI in 8/15.   Family History: mother :CAD S/P  CABG  in 5s, CVA father: prostate cancer; bro: HTN  Social History: Never Smoked Retired from [employer], works on his farm in [place] Alcohol use-no Regular exercise: physically active on farm Married  ROS: All systems reviewed and negative except as per HPI.   Current Outpatient Prescriptions  Medication Sig Dispense Refill  . amLODipine (NORVASC) 5 MG tablet TAKE 1 TABLET BY MOUTH EVERY DAY 30 tablet 11  . aspirin 81 MG tablet Take 81 mg by mouth 2 (two) times daily.     Marland Kitchen atorvastatin (LIPITOR) 20 MG tablet TAKE 1 TABLET BY MOUTH DAILY 90 tablet 3  . calcium carbonate (TUMS - DOSED IN MG ELEMENTAL CALCIUM) 500 MG chewable tablet Chew 2 tablets by mouth daily as needed for indigestion or heartburn.     . clopidogrel (PLAVIX) 75 MG tablet TAKE 1 TABLET EVERY DAY 90 tablet 3  . Cyanocobalamin (VITAMIN B12 PO) Take 1 tablet by mouth every evening.    . fish oil-omega-3 fatty acids 1000 MG capsule Take 2 g by mouth 2 (two) times daily.      . metoprolol (LOPRESSOR) 50 MG tablet  TAKE 1 TABLET BY MOUTH TWICE A DAY 180 tablet 3  . nitroGLYCERIN (NITROSTAT) 0.4 MG SL tablet Place 1 tablet (0.4 mg total) under the tongue every 5 (five) minutes as needed for chest pain. 30 tablet 3  . tadalafil (CIALIS) 10 MG tablet Take 10 mg by mouth daily as needed for erectile dysfunction.     No current facility-administered medications for this visit.     BP 134/70   Pulse 62   Ht 6\' 4"  (1.93 m)   Wt 218 lb (98.9 kg)   BMI 26.54 kg/m  General:  Well developed, well nourished, in no acute distress. Neck:  Neck supple, no JVD. No masses, thyromegaly or abnormal cervical nodes. Lungs:  Clear bilaterally to auscultation and percussion. Heart:  Non-displaced PMI, chest non-tender; regular rate and rhythm, S1, S2 without murmurs, rubs. Soft S4. Carotid upstroke normal, no bruit. Pedals normal pulses. No edema, no  varicosities. Abdomen:  Bowel sounds positive; abdomen soft and non-tender without masses, organomegaly, or hernias noted. No hepatosplenomegaly. Extremities:  No clubbing or cyanosis. Neurologic:  Alert and oriented x 3. Psych:  Normal affect.  Assessment/Plan: 1. CAD: No ischemic symptoms.  Continue ASA 81, statin, Plavix.  Now that he has 3 stents and given no bleeding history, I will continue his Plavix long-term. . 2. HTN: BP controlled on current regimen.    3. Hyperlipidemia: Good lipids in 10/17. 4. CKD: This has been stable.   5. TIA: Occurred post-PCI in 8/15.    Given my transition to CHF clinic, he will followup in 1 year with Dr. Saunders Revel.    Loralie Champagne 08/12/2016

## 2016-09-01 DIAGNOSIS — D485 Neoplasm of uncertain behavior of skin: Secondary | ICD-10-CM | POA: Diagnosis not present

## 2016-09-01 DIAGNOSIS — L988 Other specified disorders of the skin and subcutaneous tissue: Secondary | ICD-10-CM | POA: Diagnosis not present

## 2017-02-02 ENCOUNTER — Other Ambulatory Visit: Payer: Self-pay | Admitting: Cardiology

## 2017-02-06 DIAGNOSIS — D225 Melanocytic nevi of trunk: Secondary | ICD-10-CM | POA: Diagnosis not present

## 2017-02-06 DIAGNOSIS — Z85820 Personal history of malignant melanoma of skin: Secondary | ICD-10-CM | POA: Diagnosis not present

## 2017-02-06 DIAGNOSIS — L570 Actinic keratosis: Secondary | ICD-10-CM | POA: Diagnosis not present

## 2017-02-06 DIAGNOSIS — D1801 Hemangioma of skin and subcutaneous tissue: Secondary | ICD-10-CM | POA: Diagnosis not present

## 2017-02-06 DIAGNOSIS — L821 Other seborrheic keratosis: Secondary | ICD-10-CM | POA: Diagnosis not present

## 2017-02-06 DIAGNOSIS — L814 Other melanin hyperpigmentation: Secondary | ICD-10-CM | POA: Diagnosis not present

## 2017-02-06 DIAGNOSIS — L308 Other specified dermatitis: Secondary | ICD-10-CM | POA: Diagnosis not present

## 2017-08-05 ENCOUNTER — Other Ambulatory Visit: Payer: Self-pay | Admitting: Internal Medicine

## 2017-08-05 NOTE — Telephone Encounter (Signed)
Please review for refill, Thanks !  

## 2017-08-12 DIAGNOSIS — L821 Other seborrheic keratosis: Secondary | ICD-10-CM | POA: Diagnosis not present

## 2017-08-12 DIAGNOSIS — D2271 Melanocytic nevi of right lower limb, including hip: Secondary | ICD-10-CM | POA: Diagnosis not present

## 2017-08-12 DIAGNOSIS — D2272 Melanocytic nevi of left lower limb, including hip: Secondary | ICD-10-CM | POA: Diagnosis not present

## 2017-08-12 DIAGNOSIS — D1801 Hemangioma of skin and subcutaneous tissue: Secondary | ICD-10-CM | POA: Diagnosis not present

## 2017-08-12 DIAGNOSIS — Z85820 Personal history of malignant melanoma of skin: Secondary | ICD-10-CM | POA: Diagnosis not present

## 2017-08-12 DIAGNOSIS — L814 Other melanin hyperpigmentation: Secondary | ICD-10-CM | POA: Diagnosis not present

## 2017-08-12 DIAGNOSIS — L570 Actinic keratosis: Secondary | ICD-10-CM | POA: Diagnosis not present

## 2017-08-12 DIAGNOSIS — D225 Melanocytic nevi of trunk: Secondary | ICD-10-CM | POA: Diagnosis not present

## 2017-08-31 ENCOUNTER — Encounter (INDEPENDENT_AMBULATORY_CARE_PROVIDER_SITE_OTHER): Payer: Self-pay

## 2017-08-31 ENCOUNTER — Ambulatory Visit: Payer: Medicare HMO | Admitting: Internal Medicine

## 2017-08-31 ENCOUNTER — Encounter: Payer: Self-pay | Admitting: Internal Medicine

## 2017-08-31 VITALS — BP 142/64 | HR 57 | Ht 76.0 in | Wt 223.4 lb

## 2017-08-31 DIAGNOSIS — N183 Chronic kidney disease, stage 3 unspecified: Secondary | ICD-10-CM

## 2017-08-31 DIAGNOSIS — E785 Hyperlipidemia, unspecified: Secondary | ICD-10-CM

## 2017-08-31 DIAGNOSIS — I25119 Atherosclerotic heart disease of native coronary artery with unspecified angina pectoris: Secondary | ICD-10-CM | POA: Diagnosis not present

## 2017-08-31 DIAGNOSIS — I10 Essential (primary) hypertension: Secondary | ICD-10-CM

## 2017-08-31 NOTE — Patient Instructions (Addendum)
Medication Instructions:  Your physician recommends that you continue on your current medications as directed. Please refer to the Current Medication list given to you today.    Labwork: Your physician recommends that you return for a FASTING lipid profile /CMET/CBCd. Do not eat or drink after midnight the night before your lab. This is scheduled for Tuesday November 27,2018. The lab is open from 7:30 AM-5 PM.    Testing/Procedures: None   Follow-Up: Your physician recommends that you schedule a follow-up appointment in 3 months with Dr End. This is scheduled for Thursday December 03, 2017 at 2:40 PM.         If you need a refill on your cardiac medications before your next appointment, please call your pharmacy.

## 2017-08-31 NOTE — Progress Notes (Signed)
Follow-up Outpatient Visit Date: 08/31/2017  Primary Care Provider: Binnie Rail, MD [place] [id number]  Chief Complaint: Follow-up coronary artery disease  HPI:  [name] is a 71 y.o. year-old male with history of coronary artery disease with an STEMI in 2011 and 2015 status post PCI's mid LCx and LAD, respectively. He subsequently underwent PCI to the RCA in 2016. He also has a history of hypertension, hyperlipidemia, prostate cancer, CK DD, and post catheterization TIA (2015). He presents today for follow-up of his coronary artery disease. He was last seen in 08/2016 by Dr. Aundra Dubin.  Today, [name] reports feeling well. He has noted a few episodes of chest discomfort, typically after eating. He has the sensation of bubble inside his chest, which resolves either on its own or after taking Tums. He denies shortness of breath or right arm pain, which were his anginal equivalents at the time of his NSTEMI's. He has stable exertional dyspnea, which is unchanged compared with last year. He denies orthopnea, PND, and bleeding, remaining on aspirin and clopidogrel.  --------------------------------------------------------------------------------------------------  Past Medical History: 1. Hypertension: Did not tolerate lisinopril even at low dose.  2. Hyperlipidemia 3. Mild anemia, B12 deficiency,PMH of 4. Prostate cancer, hx of 5. Skin cancer, PMH  of 6. S/P  TKR 7. CAD: NSTEMI 5/11.  LHC showed 60% prox LAD, 70% mid LAD, 95% mid LCx with TIMI 2 flow distally, EF 60%.  Patient had PROMUS DES to mLCx.  NSTEMI (8/15), LHC showed 70% prox/50% mLAD, 75% ostial D1, 50-60% mRCA.  LAD stenosis significant by FFR, RCA stenosis was not.  Patient had DES to LAD.  Echo (8/15) with EF 55-60%, mild MR.   - Unstable angina 4/16.  LHC showed 50% mLAD, 90% mRCA.  He had DES to [facility].  8. CKD 9. TIA post-PCI in 8/15.   Recent CV Pertinent Labs: Lab Results  Component Value Date   CHOL  127 08/04/2016   CHOL 122 (L) 02/07/2016   HDL 43.00 08/04/2016   HDL 38 (L) 02/07/2016   LDLCALC 56 08/04/2016   LDLCALC 61 02/07/2016   LDLDIRECT 68.8 07/20/2012   TRIG 137.0 08/04/2016   TRIG 113 02/07/2016   TRIG 220 (HH) 09/07/2006   CHOLHDL 3 08/04/2016   INR 0.9 01/16/2015   K 4.2 08/04/2016   BUN 18 08/04/2016   CREATININE 1.49 08/04/2016   CREATININE 1.53 (H) 02/07/2016    Past medical and surgical history were reviewed and updated in EPIC.  Current Meds  Medication Sig  . amLODipine (NORVASC) 5 MG tablet Take 1 tablet (5 mg total) by mouth daily. Please keep upcoming appt for future refills. Thanks  . aspirin 81 MG tablet Take 81 mg by mouth 2 (two) times daily.   Marland Kitchen atorvastatin (LIPITOR) 20 MG tablet Take 1 tablet (20 mg total) by mouth daily. Please keep upcoming appt for future refills. Thanks  . calcium carbonate (TUMS - DOSED IN MG ELEMENTAL CALCIUM) 500 MG chewable tablet Chew 2 tablets by mouth daily as needed for indigestion or heartburn.   . clopidogrel (PLAVIX) 75 MG tablet Take 1 tablet (75 mg total) by mouth daily. Please keep upcoming appt for future refills. Thanks  . Cyanocobalamin (VITAMIN B12 PO) Take 1 tablet by mouth every evening.  . fish oil-omega-3 fatty acids 1000 MG capsule Take 2 g by mouth 2 (two) times daily.    . metoprolol tartrate (LOPRESSOR) 50 MG tablet Take 1 tablet (50 mg total)  by mouth 2 (two) times daily. Please keep upcoming appt for future refills. Thanks  . nitroGLYCERIN (NITROSTAT) 0.4 MG SL tablet Place 1 tablet (0.4 mg total) under the tongue every 5 (five) minutes as needed for chest pain.  . tadalafil (CIALIS) 10 MG tablet Take 10 mg by mouth daily as needed for erectile dysfunction.    Allergies: Lidocaine  Social History   Socioeconomic History  . Marital status: Married    Spouse name: Not on file  . Number of children: Not on file  . Years of education: Not on file  . Highest education level: Not on file  Social  Needs  . Financial resource strain: Not on file  . Food insecurity - worry: Not on file  . Food insecurity - inability: Not on file  . Transportation needs - medical: Not on file  . Transportation needs - non-medical: Not on file  Occupational History  . Not on file  Tobacco Use  . Smoking status: Never Smoker  . Smokeless tobacco: Never Used  Substance and Sexual Activity  . Alcohol use: No    Alcohol/week: 0.0 oz  . Drug use: No  . Sexual activity: Not on file  Other Topics Concern  . Not on file  Social History Narrative  . Not on file    Family History  Problem Relation Age of Onset  . Coronary artery disease Mother        S/P CABG, in her 67s  . Transient ischemic attack Mother 28  . Prostate cancer Father   . Hypertension Brother   . Diabetes Sister     Review of Systems: A 12-system review of systems was performed and was negative except as noted in the HPI.  --------------------------------------------------------------------------------------------------  Physical Exam: BP (!) 142/64   Pulse (!) 57   Ht 6\' 4"  (1.93 m)   Wt 223 lb 6.4 oz (101.3 kg)   BMI 27.19 kg/m   General:  Overweight man, seated comfortably in the exam room. HEENT: No conjunctival pallor or scleral icterus. Moist mucous membranes.  OP clear. Neck: Supple without lymphadenopathy, thyromegaly, JVD, or HJR. No carotid bruit. Lungs: Normal work of breathing. Clear to auscultation bilaterally without wheezes or crackles. Heart: Regular rate and rhythm without murmurs, rubs, or gallops. Non-displaced PMI. Abd: Bowel sounds present. Soft, NT/ND without hepatosplenomegaly Ext: No lower extremity edema. Radial, PT, and DP pulses are 2+ bilaterally. Skin: Warm and dry without rash.  EKG:  Sinus bradycardia (heart rate 57 bpm) without significant abnormalities.  Lab Results  Component Value Date   WBC 4.9 08/04/2016   HGB 14.0 08/04/2016   HCT 40.3 08/04/2016   MCV 82.7 08/04/2016   PLT  164.0 08/04/2016    Lab Results  Component Value Date   NA 140 08/04/2016   K 4.2 08/04/2016   CL 104 08/04/2016   CO2 26 08/04/2016   BUN 18 08/04/2016   CREATININE 1.49 08/04/2016   GLUCOSE 98 08/04/2016   ALT 18 08/04/2016    Lab Results  Component Value Date   CHOL 127 08/04/2016   HDL 43.00 08/04/2016   LDLCALC 56 08/04/2016   LDLDIRECT 68.8 07/20/2012   TRIG 137.0 08/04/2016   CHOLHDL 3 08/04/2016    --------------------------------------------------------------------------------------------------  ASSESSMENT AND PLAN: Coronary artery disease with atypical chest pain Overall, [name] feels like he is doing well. He describes a "bubble" like sensation in his chest that seems to be related to eating and resolves with Tums. The discomfort is  not exertional or consistent with his prior angina. EKG today is without ischemic changes. Nonetheless, [name] is certainly at risk for recurrent angina given multivessel CAD status post stenting. We discussed continued medical therapy and noninvasive ischemia testing. He wishes to defer testing at this time. Should his pain worsen, we will proceed with myocardial perfusion stress testing. We would like to avoid catheterization, if possible, given chronic kidney disease. We will continue with indefinite dual antiplatelet therapy, given multivessel stenting. I will check a CBC at [name] convenience.  Hypertension Blood pressure is mildly elevated today. [name] reports better readings at home. I have encouraged him to remain active to limit his salt intake. We will defer medication changes today.  Hyperlipidemia Most recent LDL a year ago was at goal. We will continue with atorvastatin 20 mg daily and recheck a fasting lipid panel as well as CMP at the patient's convenience.  Chronic kidney disease Recheck renal function at the patient's convenience. No medication changes. We will avoid nephrotoxic agents.  Follow-up: Return to  clinic in 3 months.  Nelva Bush, MD 08/31/2017 2:08 PM

## 2017-09-01 ENCOUNTER — Other Ambulatory Visit: Payer: Medicare HMO

## 2017-09-01 ENCOUNTER — Encounter: Payer: Self-pay | Admitting: Internal Medicine

## 2017-09-01 DIAGNOSIS — I10 Essential (primary) hypertension: Secondary | ICD-10-CM | POA: Diagnosis not present

## 2017-09-01 DIAGNOSIS — N183 Chronic kidney disease, stage 3 unspecified: Secondary | ICD-10-CM

## 2017-09-01 LAB — COMPREHENSIVE METABOLIC PANEL
A/G RATIO: 2.3 — AB (ref 1.2–2.2)
ALBUMIN: 4.5 g/dL (ref 3.5–4.8)
ALK PHOS: 80 IU/L (ref 39–117)
ALT: 25 IU/L (ref 0–44)
AST: 23 IU/L (ref 0–40)
BUN / CREAT RATIO: 12 (ref 10–24)
BUN: 16 mg/dL (ref 8–27)
Bilirubin Total: 0.4 mg/dL (ref 0.0–1.2)
CO2: 26 mmol/L (ref 20–29)
CREATININE: 1.36 mg/dL — AB (ref 0.76–1.27)
Calcium: 8.9 mg/dL (ref 8.6–10.2)
Chloride: 102 mmol/L (ref 96–106)
GFR calc Af Amer: 59 mL/min/{1.73_m2} — ABNORMAL LOW (ref >59)
GFR, EST NON AFRICAN AMERICAN: 51 mL/min/{1.73_m2} — AB (ref >59)
GLOBULIN, TOTAL: 2 g/dL (ref 1.5–4.5)
Glucose: 110 mg/dL — ABNORMAL HIGH (ref 65–99)
POTASSIUM: 4.1 mmol/L (ref 3.5–5.2)
SODIUM: 140 mmol/L (ref 134–144)
Total Protein: 6.5 g/dL (ref 6.0–8.5)

## 2017-09-01 LAB — CBC WITH DIFFERENTIAL/PLATELET
BASOS: 0 %
Basophils Absolute: 0 10*3/uL (ref 0.0–0.2)
EOS (ABSOLUTE): 0.2 10*3/uL (ref 0.0–0.4)
EOS: 4 %
HEMATOCRIT: 42.2 % (ref 37.5–51.0)
HEMOGLOBIN: 13.7 g/dL (ref 13.0–17.7)
Immature Grans (Abs): 0 10*3/uL (ref 0.0–0.1)
Immature Granulocytes: 0 %
LYMPHS ABS: 1.7 10*3/uL (ref 0.7–3.1)
Lymphs: 35 %
MCH: 27.6 pg (ref 26.6–33.0)
MCHC: 32.5 g/dL (ref 31.5–35.7)
MCV: 85 fL (ref 79–97)
MONOCYTES: 7 %
MONOS ABS: 0.4 10*3/uL (ref 0.1–0.9)
NEUTROS ABS: 2.6 10*3/uL (ref 1.4–7.0)
Neutrophils: 54 %
Platelets: 164 10*3/uL (ref 150–379)
RBC: 4.96 x10E6/uL (ref 4.14–5.80)
RDW: 15.1 % (ref 12.3–15.4)
WBC: 4.9 10*3/uL (ref 3.4–10.8)

## 2017-09-01 LAB — LIPID PANEL
CHOLESTEROL TOTAL: 122 mg/dL (ref 100–199)
Chol/HDL Ratio: 3 ratio (ref 0.0–5.0)
HDL: 41 mg/dL (ref >39)
LDL Calculated: 52 mg/dL (ref 0–99)
TRIGLYCERIDES: 147 mg/dL (ref 0–149)
VLDL CHOLESTEROL CAL: 29 mg/dL (ref 5–40)

## 2017-09-03 NOTE — Patient Instructions (Addendum)
[name] , Thank you for taking time to come for your Medicare Wellness Visit. I appreciate your ongoing commitment to your health goals. Please review the following plan we discussed and let me know if I can assist you in the future.   These are the goals we discussed: Goals    None      This is a list of the screening recommended for you and due dates:  Health Maintenance  Topic Date Due  . Pneumonia vaccines (1 of 2 - PCV13) 04/26/2009  . Colon Cancer Screening  04/24/2015  . Flu Shot  05/06/2017  . Tetanus Vaccine  06/14/2020  .  Hepatitis C: One time screening is recommended by Center for Disease Control  (CDC) for  adults born from [birth year] through 1967.   Completed    Your blood work was reviewed.   All other Health Maintenance issues reviewed.   All recommended immunizations and age-appropriate screenings are up-to-date or discussed.  No immunizations administered today.   Medications reviewed and updated.  No changes recommended at this time.   Please followup in one year    Health Maintenance, Male A healthy lifestyle and preventive care is important for your health and wellness. Ask your health care provider about what schedule of regular examinations is right for you. What should I know about weight and diet? Eat a Healthy Diet  Eat plenty of vegetables, fruits, whole grains, low-fat dairy products, and lean protein.  Do not eat a lot of foods high in solid fats, added sugars, or salt.  Maintain a Healthy Weight Regular exercise can help you achieve or maintain a healthy weight. You should:  Do at least 150 minutes of exercise each week. The exercise should increase your heart rate and make you sweat (moderate-intensity exercise).  Do strength-training exercises at least twice a week.  Watch Your Levels of Cholesterol and Blood Lipids  Have your blood tested for lipids and cholesterol every 5 years starting at 73 years of age. If you are at high risk for  heart disease, you should start having your blood tested when you are 73 years old. You may need to have your cholesterol levels checked more often if: ? Your lipid or cholesterol levels are high. ? You are older than 73 years of age. ? You are at high risk for heart disease.  What should I know about cancer screening? Many types of cancers can be detected early and may often be prevented. Lung Cancer  You should be screened every year for lung cancer if: ? You are a current smoker who has smoked for at least 30 years. ? You are a former smoker who has quit within the past 15 years.  Talk to your health care provider about your screening options, when you should start screening, and how often you should be screened.  Colorectal Cancer  Routine colorectal cancer screening usually begins at 73 years of age and should be repeated every 5-10 years until you are 73 years old. You may need to be screened more often if early forms of precancerous polyps or small growths are found. Your health care provider may recommend screening at an earlier age if you have risk factors for colon cancer.  Your health care provider may recommend using home test kits to check for hidden blood in the stool.  A small camera at the end of a tube can be used to examine your colon (sigmoidoscopy or colonoscopy). This checks for the earliest  forms of colorectal cancer.  Prostate and Testicular Cancer  Depending on your age and overall health, your health care provider may do certain tests to screen for prostate and testicular cancer.  Talk to your health care provider about any symptoms or concerns you have about testicular or prostate cancer.  Skin Cancer  Check your skin from head to toe regularly.  Tell your health care provider about any new moles or changes in moles, especially if: ? There is a change in a mole's size, shape, or color. ? You have a mole that is larger than a pencil eraser.  Always use  sunscreen. Apply sunscreen liberally and repeat throughout the day.  Protect yourself by wearing long sleeves, pants, a wide-brimmed hat, and sunglasses when outside.  What should I know about heart disease, diabetes, and high blood pressure?  If you are 1-61 years of age, have your blood pressure checked every 3-5 years. If you are 65 years of age or older, have your blood pressure checked every year. You should have your blood pressure measured twice-once when you are at a hospital or clinic, and once when you are not at a hospital or clinic. Record the average of the two measurements. To check your blood pressure when you are not at a hospital or clinic, you can use: ? An automated blood pressure machine at a pharmacy. ? A home blood pressure monitor.  Talk to your health care provider about your target blood pressure.  If you are between 47-53 years old, ask your health care provider if you should take aspirin to prevent heart disease.  Have regular diabetes screenings by checking your fasting blood sugar level. ? If you are at a normal weight and have a low risk for diabetes, have this test once every three years after the age of 36. ? If you are overweight and have a high risk for diabetes, consider being tested at a younger age or more often.  A one-time screening for abdominal aortic aneurysm (AAA) by ultrasound is recommended for men aged 64-75 years who are current or former smokers. What should I know about preventing infection? Hepatitis B If you have a higher risk for hepatitis B, you should be screened for this virus. Talk with your health care provider to find out if you are at risk for hepatitis B infection. Hepatitis C Blood testing is recommended for:  Everyone born from [birth year] through 1967.  Anyone with known risk factors for hepatitis C.  Sexually Transmitted Diseases (STDs)  You should be screened each year for STDs including gonorrhea and chlamydia if: ? You are  sexually active and are younger than 73 years of age. ? You are older than 73 years of age and your health care provider tells you that you are at risk for this type of infection. ? Your sexual activity has changed since you were last screened and you are at an increased risk for chlamydia or gonorrhea. Ask your health care provider if you are at risk.  Talk with your health care provider about whether you are at high risk of being infected with HIV. Your health care provider may recommend a prescription medicine to help prevent HIV infection.  What else can I do?  Schedule regular health, dental, and eye exams.  Stay current with your vaccines (immunizations).  Do not use any tobacco products, such as cigarettes, chewing tobacco, and e-cigarettes. If you need help quitting, ask your health care provider.  Limit alcohol intake  to no more than 2 drinks per day. One drink equals 12 ounces of beer, 5 ounces of wine, or 1 ounces of hard liquor.  Do not use street drugs.  Do not share needles.  Ask your health care provider for help if you need support or information about quitting drugs.  Tell your health care provider if you often feel depressed.  Tell your health care provider if you have ever been abused or do not feel safe at home. This information is not intended to replace advice given to you by your health care provider. Make sure you discuss any questions you have with your health care provider. Document Released: 03/20/2008 Document Revised: 05/21/2016 Document Reviewed: 06/26/2015 Elsevier Interactive Patient Education  [employer].

## 2017-09-03 NOTE — Progress Notes (Signed)
Subjective:    Patient ID: [name], male    DOB: 28-Jan-1946, 71 y.o.   MRN: [id number]  HPI Here for medicare wellness exam and a physical exam.   I have personally reviewed and have noted 1.The patient's medical and social history 2.Their use of alcohol, tobacco or illicit drugs 3.Their current medications and supplements 4.The patient's functional ability including ADL's, fall risks, home                 safety risk and hearing or visual impairment. 5.Diet and physical activities 6.Evidence for depression or mood disorders 7.Care team reviewed  -   Cardio - Dr End,  Derm - Dr Ubaldo Glassing    Are there smokers in your home (other than you)? No  Risk Factors Exercise: active around house, no regular exercise Dietary issues discussed: eats relatively healthy, eats at home  Vitamin and supplement use:  Reviewed and discussed  Opiod use:  none Side effects from medication:  n/a Does medications benefits outweigh risks/side effects:   n/a  Cardiac risk factors: advanced age, hypertension, hyperlipidemia, CAD  Depression Screen  Have you felt down, depressed or hopeless? No  Have you felt little interest or pleasure in doing things?  No  Activities of Daily Living In your present state of health, do you have any difficulty performing the following activities?:  Driving? No Managing money?  No Feeding yourself? No Getting from bed to chair? No Climbing a flight of stairs? No Preparing food and eating?: No Bathing or showering? No Getting dressed: No Getting to/using the toilet? No Moving around from place to place: No In the past year have you fallen or had a near fall?: No     Do you have more than one partner?  No   Hearing Difficulties: yes  Do you often ask people to speak up or repeat themselves? es Do you experience ringing or noises in your ears? No Do you have difficulty understanding soft or  whispered voices? Yes  Vision:              Any change in vision:  no             Up to date with eye exam:   No - due  Memory:  Do you feel that you have a problem with memory? No  Do you often misplace items? No  Do you feel safe at home?  Yes  Cognitive Testing  Alert, Orientated? Yes  Normal Appearance? Yes  Recall of three objects?  Yes  Can perform simple calculations? Yes  Displays appropriate judgment? Yes  Can read the correct time from a watch face? Yes   Advanced Directives have been discussed with the patient? Yes   Medications and allergies reviewed with patient and updated if appropriate.  Patient Active Problem List   Diagnosis Date Noted  . Erectile dysfunction 08/04/2016  . Elevated TSH 08/29/2015  . S/P coronary artery stent placement, 01/19/15 S/p DES of mid RCA 01/20/2015  . Unstable angina ([place])   . Hyperglycemia 10/03/2014  . TIA (transient ischemic attack) 07/31/2014  . CVA (cerebral infarction) 06/05/2014  . NSTEMI (non-ST elevated myocardial infarction) ([place]) 06/04/2014  . Chronic kidney disease (CKD), stage III (moderate) ([place]) 06/14/2010  . CORONARY ATHEROSCLEROSIS NATIVE CORONARY ARTERY 03/01/2010  . PERNICIOUS ANEMIA 04/24/2009  . TREMOR, ESSENTIAL, RIGHT HAND 04/24/2009  . PROSTATE CANCER, HX OF 04/24/2009  . Melanoma ([place]) 04/24/2009  . Hyperlipidemia LDL goal <70 10/28/2007  .  Essential hypertension 10/13/2007    Current Outpatient Medications on File Prior to Visit  Medication Sig Dispense Refill  . amLODipine (NORVASC) 5 MG tablet Take 1 tablet (5 mg total) by mouth daily. Please keep upcoming appt for future refills. Thanks 90 tablet 0  . aspirin 81 MG tablet Take 81 mg by mouth 2 (two) times daily.     Marland Kitchen atorvastatin (LIPITOR) 20 MG tablet Take 1 tablet (20 mg total) by mouth daily. Please keep upcoming appt for future refills. Thanks 90 tablet 0  . calcium carbonate (TUMS - DOSED IN MG ELEMENTAL CALCIUM) 500 MG chewable tablet Chew 2  tablets by mouth daily as needed for indigestion or heartburn.     . clopidogrel (PLAVIX) 75 MG tablet Take 1 tablet (75 mg total) by mouth daily. Please keep upcoming appt for future refills. Thanks 90 tablet 0  . Cyanocobalamin (VITAMIN B12 PO) Take 1 tablet by mouth every evening.    . fish oil-omega-3 fatty acids 1000 MG capsule Take 2 g by mouth 2 (two) times daily.      . metoprolol tartrate (LOPRESSOR) 50 MG tablet Take 1 tablet (50 mg total) by mouth 2 (two) times daily. Please keep upcoming appt for future refills. Thanks 180 tablet 0  . nitroGLYCERIN (NITROSTAT) 0.4 MG SL tablet Place 1 tablet (0.4 mg total) under the tongue every 5 (five) minutes as needed for chest pain. 30 tablet 3   No current facility-administered medications on file prior to visit.     Past Medical History:  Diagnosis Date  . Anemia, mild   . CAD (coronary artery disease)    a. 02/2010 NSTEMI/PCI: PROMUS DES to mRCA. b. 06/05/14 Cath/PCI: pLAD 70%--> s/p PCI/DES (Promus DES);  c. 01/2015 Cath/PCI: LM nl, LAD 13m, patent prox stent, LCX patent prox stent, RCA 30p, 40m (3.5x20 Synergy DES).  . CKD (chronic kidney disease), stage III ([place])   . Diastolic dysfunction    a. Echo 6/11: mild LVH, EF 55-60%, GR1DD, Trivial MR, mild LAE. b. echo 06/04/14: EF 55-60%, no WMA, GR1DD, Ao valve mildly calcifed, mild MR, mild LAE  . History of pulmonary embolism   . Hyperlipidemia   . Hypertension    did not tolerate Lisinopril event at low dose  . Melanoma ([place])    a. 1987. b. 2012  . Prostate cancer ([place])    Dr Karsten Ro  . TIA (transient ischemic attack)    a. post cath and PCI on 06/05/2014 - no residual sequelae.  . Vitamin B12 deficiency     Past Surgical History:  Procedure Laterality Date  . CHOLECYSTECTOMY    . COLONOSCOPY  2006  . KNEE ARTHROSCOPY     right  . LEFT HEART CATHETERIZATION WITH CORONARY ANGIOGRAM N/A 06/05/2014   Procedure: LEFT HEART CATHETERIZATION WITH CORONARY ANGIOGRAM;  Surgeon: Blane Ohara, MD;  Location: [facility] C[facility] LAB;  Service: Cardiovascular;  Laterality: N/A;  . LEFT HEART CATHETERIZATION WITH CORONARY ANGIOGRAM N/A 01/19/2015   Procedure: LEFT HEART CATHETERIZATION WITH CORONARY ANGIOGRAM;  Surgeon: Peter M Martinique, MD;  Location[facility] CATH LAB;  Service: Cardiovascular;  Laterality: N/A;  . MELANOMA EXCISION  2011    L chest  . MELANOMA EXCISION  1991   back  . mRCA stent  02/2010  . PROSTATECTOMY     Dr. Karsten Ro  . TONSILLECTOMY      Social History   Socioeconomic History  . Marital status: Married    Spouse name: None  . Number  of children: None  . Years of education: None  . Highest education level: None  Social Needs  . Financial resource strain: None  . Food insecurity - worry: None  . Food insecurity - inability: None  . Transportation needs - medical: None  . Transportation needs - non-medical: None  Occupational History  . None  Tobacco Use  . Smoking status: Never Smoker  . Smokeless tobacco: Never Used  Substance and Sexual Activity  . Alcohol use: No    Alcohol/week: 0.0 oz  . Drug use: No  . Sexual activity: None  Other Topics Concern  . None  Social History Narrative  . None    Family History  Problem Relation Age of Onset  . Coronary artery disease Mother        S/P CABG, in her 67s  . Transient ischemic attack Mother 43  . Prostate cancer Father   . Hypertension Brother   . Diabetes Sister     Review of Systems  Constitutional: Negative for chills and fever.  HENT: Positive for hearing loss. Negative for tinnitus.   Eyes: Negative for visual disturbance.  Respiratory: Negative for cough, shortness of breath and wheezing.   Cardiovascular: Positive for chest pain (gas related). Negative for palpitations and leg swelling.  Gastrointestinal: Positive for constipation (mild). Negative for abdominal pain, blood in stool, diarrhea and nausea.       Occ gerd  Genitourinary: Negative for difficulty urinating and dysuria.    Musculoskeletal: Positive for arthralgias (knees, ankles, feet). Negative for back pain.  Skin: Negative for color change and rash.  Neurological: Negative for headaches.  Psychiatric/Behavioral: Negative for dysphoric mood. The patient is not nervous/anxious.        Objective:   Vitals:   09/04/17 1309  BP: 130/84  Pulse: 69  Resp: 16  Temp: 98.1 F (36.7 C)  SpO2: 95%   Filed Weights   09/04/17 1309  Weight: 218 lb (98.9 kg)   Body mass index is 26.54 kg/m.  Wt Readings from Last 3 Encounters:  09/04/17 218 lb (98.9 kg)  08/31/17 223 lb 6.4 oz (101.3 kg)  08/11/16 218 lb (98.9 kg)     Physical Exam Constitutional: He appears well-developed and well-nourished. No distress.  HENT:  Head: Normocephalic and atraumatic.  Right Ear: External ear normal.  Left Ear: External ear normal.  Mouth/Throat: Oropharynx is clear and moist.  Normal ear canals and TM b/l  Eyes: Conjunctivae and EOM are normal.  Neck: Neck supple. No tracheal deviation present. No thyromegaly present.  No carotid bruit  Cardiovascular: Normal rate, regular rhythm, normal heart sounds and intact distal pulses.   No murmur heard. Pulmonary/Chest: Effort normal and breath sounds normal. No respiratory distress. He has no wheezes. He has no rales.  Abdominal: Soft. He exhibits no distension. There is no tenderness.  Genitourinary: deferred  Musculoskeletal: He exhibits no edema.  Lymphadenopathy:   He has no cervical adenopathy.  Skin: Skin is warm and dry. He is not diaphoretic.  Psychiatric: He has a normal mood and affect. His behavior is normal.         Assessment & Plan:   Wellness Exam: Immunizations   Discussed shingrix, flu and prevnar due - deferred both Colonoscopy  Not up to date Eye exam  Not up to date - he will schedule Hearing loss  Yes, deferred referral  Memory concerns/difficulties   No, except related to normal aging Independent of ADLs  fully Stressed the importance  of  regular exercise   Patient received copy of preventative screening tests/immunizations recommended for the next 5-10 years.   Physical exam: Screening blood work   reviewed Immunizations   Discussed shingrix, flu and prevnar due - deferred both Colonoscopy  Not up to date Eye exam  Not up to date - he will schedule EKG  Done by cardiology Exercise  Very active, but no regimented exercise Weight  Good for age Skin  Sees derm - no concerns Substance abuse   none  See Problem List for Assessment and Plan of chronic medical problems.

## 2017-09-04 ENCOUNTER — Encounter: Payer: Self-pay | Admitting: Internal Medicine

## 2017-09-04 ENCOUNTER — Ambulatory Visit (INDEPENDENT_AMBULATORY_CARE_PROVIDER_SITE_OTHER): Payer: Medicare HMO | Admitting: Internal Medicine

## 2017-09-04 ENCOUNTER — Other Ambulatory Visit: Payer: Medicare HMO

## 2017-09-04 VITALS — BP 130/84 | HR 69 | Temp 98.1°F | Resp 16 | Wt 218.0 lb

## 2017-09-04 DIAGNOSIS — N183 Chronic kidney disease, stage 3 unspecified: Secondary | ICD-10-CM

## 2017-09-04 DIAGNOSIS — E785 Hyperlipidemia, unspecified: Secondary | ICD-10-CM

## 2017-09-04 DIAGNOSIS — I10 Essential (primary) hypertension: Secondary | ICD-10-CM | POA: Diagnosis not present

## 2017-09-04 DIAGNOSIS — Z1211 Encounter for screening for malignant neoplasm of colon: Secondary | ICD-10-CM | POA: Diagnosis not present

## 2017-09-04 DIAGNOSIS — Z8546 Personal history of malignant neoplasm of prostate: Secondary | ICD-10-CM

## 2017-09-04 DIAGNOSIS — Z0000 Encounter for general adult medical examination without abnormal findings: Secondary | ICD-10-CM

## 2017-09-04 DIAGNOSIS — R739 Hyperglycemia, unspecified: Secondary | ICD-10-CM

## 2017-09-04 MED ORDER — POTASSIUM GLUCONATE 2 MEQ PO TABS: ORAL_TABLET | ORAL | 0 refills | Status: DC

## 2017-09-04 NOTE — Assessment & Plan Note (Signed)
BP well controlled Current regimen effective and well tolerated Continue current medications at current doses  

## 2017-09-04 NOTE — Assessment & Plan Note (Signed)
Stable Avoid nsaids  Good water intake discussed Keep sugars, bp controlled

## 2017-09-04 NOTE — Assessment & Plan Note (Signed)
Will check a1c next year Stressed keeping sugars controlled for CKD

## 2017-09-04 NOTE — Assessment & Plan Note (Signed)
Just had blood work done - does not want to get additional blood work Will check psa next year

## 2017-09-04 NOTE — Assessment & Plan Note (Signed)
Controlled.  Continue statin. 

## 2017-09-10 ENCOUNTER — Encounter: Payer: Self-pay | Admitting: Nurse Practitioner

## 2017-09-21 ENCOUNTER — Ambulatory Visit: Payer: Medicare HMO | Admitting: Nurse Practitioner

## 2017-09-21 ENCOUNTER — Encounter: Payer: Self-pay | Admitting: Nurse Practitioner

## 2017-09-21 ENCOUNTER — Telehealth: Payer: Self-pay | Admitting: Nurse Practitioner

## 2017-09-21 ENCOUNTER — Telehealth: Payer: Self-pay

## 2017-09-21 VITALS — BP 118/66 | HR 78 | Ht 76.0 in | Wt 220.6 lb

## 2017-09-21 DIAGNOSIS — Z7901 Long term (current) use of anticoagulants: Secondary | ICD-10-CM

## 2017-09-21 DIAGNOSIS — K5900 Constipation, unspecified: Secondary | ICD-10-CM

## 2017-09-21 DIAGNOSIS — Z1211 Encounter for screening for malignant neoplasm of colon: Secondary | ICD-10-CM

## 2017-09-21 DIAGNOSIS — K219 Gastro-esophageal reflux disease without esophagitis: Secondary | ICD-10-CM

## 2017-09-21 MED ORDER — NA SULFATE-K SULFATE-MG SULF 17.5-3.13-1.6 GM/177ML PO SOLN: ORAL | 0 refills | Status: DC

## 2017-09-21 NOTE — Patient Instructions (Addendum)
If you are age 73 or older, your body mass index should be between 23-30. Your Body mass index is 26.85 kg/m. If this is out of the aforementioned range listed, please consider follow up with your Primary Care Provider.  If you are age 17 or younger, your body mass index should be between 19-25. Your Body mass index is 26.85 kg/m. If this is out of the aformentioned range listed, please consider follow up with your Primary Care Provider.   You have been scheduled for a colonoscopy. Please follow written instructions given to you at your visit today.  Please pick up your prep supplies at the pharmacy within the next 1-3 days. If you use inhalers (even only as needed), please bring them with you on the day of your procedure. Your physician has requested that you go to www.startemmi.com and enter the access code given to you at your visit today. This web site gives a general overview about your procedure. However, you should still follow specific instructions given to you by our office regarding your preparation for the procedure.  You may have a light breakfast the morning of prep day (the day before the procedure).   You may choose from one of the following items: eggs and toast OR chicken noodle soup and crackers.   You should have your breakfast completed between 8:00 and 9:00 am the day before your procedure.   After you have had your light breakfast you should start a clear liquid diet only, NO SOLIDS. No additional solid food is allowed. You may continue to have clear liquid up to 3 hours prior to your procedure.   We have sent the following medications to your pharmacy for you to pick up at your convenience: [place] will be contacted by our office prior to your procedure for directions on holding your Plavix.  If you do not hear from our office 1 week prior to your scheduled procedure, please call [phone] to discuss.   Increase stool softener to once daily.  You have been  given GERD literature.  Thank you for choosing me and [facility].   Tye Savoy, NP

## 2017-09-21 NOTE — Progress Notes (Signed)
Chief Complaint:  Colon cancer screening  HPI:  Patient is a 71 year old male known remotely to Dr. Sharlett Iles. He had a normal screening colonoscopy July 2006. Patient is due for colon cancer screening. He is on Plavix. He has history CAD / NSTEMI and is status post cardiac stenting in 2005, 2011 and 2015. He endorses constipation described as hard stool. Stool softener helps but only takes once a week. Patient also endorses GERD symptoms every 1-2 weeks. This is usually in the form of chest discomfort after meals. Feels like an air bubble, belching helps as does taking Tums. The pain is nonexertional. It can radiate through to his back at times. Pain is not associated with shortness of breath though walking back and forth between home and barn can make him a little winded at times    Past Medical History:  Diagnosis Date  . Anemia, mild   . CAD (coronary artery disease)    a. 02/2010 NSTEMI/PCI: PROMUS DES to mRCA. b. 06/05/14 Cath/PCI: pLAD 70%--> s/p PCI/DES (Promus DES);  c. 01/2015 Cath/PCI: LM nl, LAD 21m, patent prox stent, LCX patent prox stent, RCA 30p, 53m (3.5x20 Synergy DES).  . CKD (chronic kidney disease), stage III ([place])   . Diastolic dysfunction    a. Echo 6/11: mild LVH, EF 55-60%, GR1DD, Trivial MR, mild LAE. b. echo 06/04/14: EF 55-60%, no WMA, GR1DD, Ao valve mildly calcifed, mild MR, mild LAE  . History of pulmonary embolism   . Hyperlipidemia   . Hypertension    did not tolerate Lisinopril event at low dose  . Melanoma ([place])    a. 1987. b. 2012  . Prostate cancer ([place])    Dr Karsten Ro  . TIA (transient ischemic attack)    a. post cath and PCI on 06/05/2014 - no residual sequelae.  . Vitamin B12 deficiency      Past Surgical History:  Procedure Laterality Date  . CHOLECYSTECTOMY    . COLONOSCOPY  2006  . KNEE ARTHROSCOPY     right  . LEFT HEART CATHETERIZATION WITH CORONARY ANGIOGRAM N/A 06/05/2014   Procedure: LEFT HEART CATHETERIZATION WITH CORONARY  ANGIOGRAM;  Surgeon: Blane Ohara, MD;  Location: [facility] CATH LAB;  Service: Cardiovascular;  Laterality: N/A;  . LEFT HEART CATHETERIZATION WITH CORONARY ANGIOGRAM N/A 01/19/2015   Procedure: LEFT HEART CATHETERIZATION WITH CORONARY ANGIOGRAM;  Surgeon: Peter M Martinique, MD;  Location: [facility] CATH LAB;  Service: Cardiovascular;  Laterality: N/A;  . MELANOMA EXCISION  2011    L chest  . MELANOMA EXCISION  1991   back  . mRCA stent  02/2010  . PROSTATECTOMY     Dr. Karsten Ro  . TONSILLECTOMY     Family History  Problem Relation Age of Onset  . Coronary artery disease Mother        S/P CABG, in her 22s  . Transient ischemic attack Mother 57  . Prostate cancer Father   . Hypertension Brother   . Diabetes Sister    Social History   Tobacco Use  . Smoking status: Never Smoker  . Smokeless tobacco: Never Used  Substance Use Topics  . Alcohol use: No    Alcohol/week: 0.0 oz  . Drug use: No   Current Outpatient Medications  Medication Sig Dispense Refill  . amLODipine (NORVASC) 5 MG tablet Take 1 tablet (5 mg total) by mouth daily. Please keep upcoming appt for future refills. Thanks 90 tablet 0  . aspirin 81 MG tablet Take 81 mg by mouth  2 (two) times daily.     Marland Kitchen atorvastatin (LIPITOR) 20 MG tablet Take 1 tablet (20 mg total) by mouth daily. Please keep upcoming appt for future refills. Thanks 90 tablet 0  . calcium carbonate (TUMS - DOSED IN MG ELEMENTAL CALCIUM) 500 MG chewable tablet Chew 2 tablets by mouth daily as needed for indigestion or heartburn.     . clopidogrel (PLAVIX) 75 MG tablet Take 1 tablet (75 mg total) by mouth daily. Please keep upcoming appt for future refills. Thanks 90 tablet 0  . Cyanocobalamin (VITAMIN B12 PO) Take 1 tablet by mouth every evening.    . fish oil-omega-3 fatty acids 1000 MG capsule Take 2 g by mouth 2 (two) times daily.      . metoprolol tartrate (LOPRESSOR) 50 MG tablet Take 1 tablet (50 mg total) by mouth 2 (two) times daily. Please keep upcoming  appt for future refills. Thanks 180 tablet 0  . nitroGLYCERIN (NITROSTAT) 0.4 MG SL tablet Place 1 tablet (0.4 mg total) under the tongue every 5 (five) minutes as needed for chest pain. 30 tablet 3  . Potassium Gluconate 2 MEQ TABS Taking one pill daily 120 each 0   No current facility-administered medications for this visit.    Allergies  Allergen Reactions  . Lidocaine Other (See Comments)    Syncope post palmer injection; probably vasovagal as no reaction to intraarticular Lidocaine into knee     Review of Systems: All systems reviewed and negative except where noted in HPI.    Physical Exam: BP 118/66   Pulse 78   Ht 6\' 4"  (1.93 m)   Wt 220 lb 9.6 oz (100.1 kg)   BMI 26.85 kg/m  Constitutional:  Well-developed, white male in no acute distress. Psychiatric: Normal mood and affect. Behavior is normal. EENT: Pupils normal.  Conjunctivae are normal. No scleral icterus. Neck supple.  Cardiovascular: Normal rate, regular rhythm. No edema Pulmonary/chest: Effort normal and breath sounds normal. No wheezing, rales or rhonchi. Abdominal: Soft, nondistended. Nontender. Bowel sounds active throughout. There are no masses palpable. No hepatomegaly. Lymphadenopathy: No cervical adenopathy noted. Neurological: Alert and oriented to person place and time. Skin: Skin is warm and dry. No rashes noted.   ASSESSMENT AND PLAN:  1. 71 yo male due for colon cancer screening. Recent problems with constipation but otherwise no blood in stool.  -Patient will be scheduled for a screening colonoscopy with possible polypectomy.  The risks and benefits of the procedure were discussed and the patient agrees to proceed.   2. Constipation with hard stool. Stool softener helps but didn't realize he could take on more often than once a week.  -Recommended he increase stool softener to every other day or even daily if needed  3. Hx of CAD / stents, on plavix.  -Hold plavix 5 days before procedure -  will instruct when and how to resume after procedure. Patient understands that there is a low but real risk of cardiovascular event such as heart attack, stroke, embolism, thrombosis or ischemia. He consents to proceed. Will communicate by phone or EMR with patient's prescribing provider to confirm that holding plavix is reasonable in this case.   4. GERD. Postprandial chest discomfort a couple of times a week. Tums help.  -Anti-reflux literature given -If becomes symptomatic more than 2-3 times a week consider starting H2 blocker or PPI.    Tye Savoy, NP  09/21/2017, 9:18 AM  Cc: Binnie Rail, MD

## 2017-09-21 NOTE — Telephone Encounter (Signed)
It is ok for [name] to proceed with his endoscopic procedure from a cardiac standpoint. Clopidogrel should be held 7 days prior to the procedure and restarted when felt safe to do so from a periprocedural standpoint. He should remain on aspirin throughout the periprocedural period.  Nelva Bush, MD [facility] Pager: [phone]

## 2017-09-21 NOTE — Telephone Encounter (Signed)
   Chart reviewed as part of pre-operative protocol coverage. This is a clearance to come off Plavix. Will forward to Dr. Saunders Revel for input. Dr. Saunders Revel, please route your reply to Thurmon Fair, [place] in [place]. If any further needs from pre-op pool, can route back to [place] as well. Will remove from APP pool.  Charlie Pitter, PA-C 09/21/2017, 3:19 PM

## 2017-09-21 NOTE — Telephone Encounter (Signed)
[facility] [address] [place], [place]  [address] Phone:  [phone]   Fax:  [phone]  09/21/2017   RE:      [name] DOB:   30-Apr-1946 MRN:   [id number]   Dear Dr, End,    We have scheduled the above patient for an endoscopic procedure. Our records show that he is on anticoagulation therapy.   Please advise as to how long the patient may come off his therapy of Plavix prior to the colonoscopy procedure, which is scheduled for 10/27/17.  Please fax back/ or route the completed form to [place] at [phone].   Sincerely,    Thurmon Fair, RMA

## 2017-09-22 ENCOUNTER — Encounter: Payer: Self-pay | Admitting: Nurse Practitioner

## 2017-09-22 NOTE — Progress Notes (Signed)
I agree with the above note, plan 

## 2017-09-28 NOTE — Telephone Encounter (Signed)
Spoke with patient and his wife regarding holding Plavix 7 days prior to procedure.  Advised to continue Aspirin during this time.  Patient and his wife verbalized understanding.

## 2017-10-16 ENCOUNTER — Encounter: Payer: Self-pay | Admitting: Gastroenterology

## 2017-10-16 ENCOUNTER — Telehealth: Payer: Self-pay | Admitting: Gastroenterology

## 2017-10-16 NOTE — Telephone Encounter (Signed)
No answer and no voice mail.

## 2017-10-19 NOTE — Telephone Encounter (Signed)
The pt wanted to confirm that he is to stop his plavix 5 days prior to his procedure.  I did confirm and he will call back with any further questions or concerns

## 2017-10-27 ENCOUNTER — Other Ambulatory Visit: Payer: Self-pay

## 2017-10-27 ENCOUNTER — Encounter: Payer: Self-pay | Admitting: Gastroenterology

## 2017-10-27 ENCOUNTER — Ambulatory Visit (AMBULATORY_SURGERY_CENTER): Payer: Medicare HMO | Admitting: Gastroenterology

## 2017-10-27 VITALS — BP 119/66 | HR 60 | Temp 97.3°F | Resp 12 | Ht 76.0 in | Wt 220.0 lb

## 2017-10-27 DIAGNOSIS — Z8673 Personal history of transient ischemic attack (TIA), and cerebral infarction without residual deficits: Secondary | ICD-10-CM | POA: Diagnosis not present

## 2017-10-27 DIAGNOSIS — D125 Benign neoplasm of sigmoid colon: Secondary | ICD-10-CM | POA: Diagnosis not present

## 2017-10-27 DIAGNOSIS — E669 Obesity, unspecified: Secondary | ICD-10-CM | POA: Diagnosis not present

## 2017-10-27 DIAGNOSIS — Z1211 Encounter for screening for malignant neoplasm of colon: Secondary | ICD-10-CM | POA: Diagnosis not present

## 2017-10-27 DIAGNOSIS — K635 Polyp of colon: Secondary | ICD-10-CM

## 2017-10-27 DIAGNOSIS — I2510 Atherosclerotic heart disease of native coronary artery without angina pectoris: Secondary | ICD-10-CM | POA: Diagnosis not present

## 2017-10-27 DIAGNOSIS — I10 Essential (primary) hypertension: Secondary | ICD-10-CM | POA: Diagnosis not present

## 2017-10-27 MED ORDER — SODIUM CHLORIDE 0.9 % IV SOLN: 500.0000 mL | Freq: Once | INTRAVENOUS | Status: DC

## 2017-10-27 NOTE — Progress Notes (Signed)
Called to room to assist during endoscopic procedure.  Patient ID and intended procedure confirmed with present staff. Received instructions for my participation in the procedure from the performing physician.  

## 2017-10-27 NOTE — Progress Notes (Signed)
A/ox3 pleased with MAC, report to [facility] RN

## 2017-10-27 NOTE — Patient Instructions (Signed)
INFORMATION ON POLYPS GIVEN.   YOU HAD AN ENDOSCOPIC PROCEDURE TODAY AT THE [facility]:   Refer to the procedure report that was given to you for any specific questions about what was found during the examination.  If the procedure report does not answer your questions, please call your gastroenterologist to clarify.  If you requested that your care partner not be given the details of your procedure findings, then the procedure report has been included in a sealed envelope for you to review at your convenience later.  YOU SHOULD EXPECT: Some feelings of bloating in the abdomen. Passage of more gas than usual.  Walking can help get rid of the air that was put into your GI tract during the procedure and reduce the bloating. If you had a lower endoscopy (such as a colonoscopy or flexible sigmoidoscopy) you may notice spotting of blood in your stool or on the toilet paper. If you underwent a bowel prep for your procedure, you may not have a normal bowel movement for a few days.  Please Note:  You might notice some irritation and congestion in your nose or some drainage.  This is from the oxygen used during your procedure.  There is no need for concern and it should clear up in a day or so.  SYMPTOMS TO REPORT IMMEDIATELY:   Following lower endoscopy (colonoscopy or flexible sigmoidoscopy):  Excessive amounts of blood in the stool  Significant tenderness or worsening of abdominal pains  Swelling of the abdomen that is new, acute  Fever of 100F or higher    For urgent or emergent issues, a gastroenterologist can be reached at any hour by calling [phone].   DIET:  We do recommend a small meal at first, but then you may proceed to your regular diet.  Drink plenty of fluids but you should avoid alcoholic beverages for 24 hours.  ACTIVITY:  You should plan to take it easy for the rest of today and you should NOT DRIVE or use heavy machinery until tomorrow (because of the sedation  medicines used during the test).    FOLLOW UP: Our staff will call the number listed on your records the next business day following your procedure to check on you and address any questions or concerns that you may have regarding the information given to you following your procedure. If we do not reach you, we will leave a message.  However, if you are feeling well and you are not experiencing any problems, there is no need to return our call.  We will assume that you have returned to your regular daily activities without incident.  If any biopsies were taken you will be contacted by phone or by letter within the next 1-3 weeks.  Please call us at [phone] if you have not heard about the biopsies in 3 weeks.    SIGNATURES/CONFIDENTIALITY: You and/or your care partner have signed paperwork which will be entered into your electronic medical record.  These signatures attest to the fact that that the information above on your After Visit Summary has been reviewed and is understood.  Full responsibility of the confidentiality of this discharge information lies with you and/or your care-partner. 

## 2017-10-27 NOTE — Op Note (Signed)
[place] Patient Name: [name] Procedure Date: 10/27/2017 2:15 PM MRN: [id number] Endoscopist: Milus Banister , MD Age: 72 Referring MD:  Date of Birth: 21-Jan-1946 Gender: Male Account #: [id number] Procedure:                Colonoscopy Indications:              Screening for colorectal malignant neoplasm Medicines:                Monitored Anesthesia Care Procedure:                Pre-Anesthesia Assessment:                           - Prior to the procedure, a History and Physical                            was performed, and patient medications and                            allergies were reviewed. The patient's tolerance of                            previous anesthesia was also reviewed. The risks                            and benefits of the procedure and the sedation                            options and risks were discussed with the patient.                            All questions were answered, and informed consent                            was obtained. Prior Anticoagulants: The patient has                            taken Plavix (clopidogrel), last dose was 5 days                            prior to procedure. ASA Grade Assessment: II - A                            patient with mild systemic disease. After reviewing                            the risks and benefits, the patient was deemed in                            satisfactory condition to undergo the procedure.                           After obtaining informed consent, the colonoscope  was passed under direct vision. Throughout the                            procedure, the patient's blood pressure, pulse, and                            oxygen saturations were monitored continuously. The                            Colonoscope was introduced through the anus and                            advanced to the the cecum, identified by                            appendiceal  orifice and ileocecal valve. The                            colonoscopy was performed without difficulty. The                            patient tolerated the procedure well. The quality                            of the bowel preparation was excellent. The                            ileocecal valve, appendiceal orifice, and rectum                            were photographed. Scope In: 2:18:46 PM Scope Out: 2:33:45 PM Scope Withdrawal Time: 0 hours 12 minutes 1 second  Total Procedure Duration: 0 hours 14 minutes 59 seconds  Findings:                 Two sessile polyps were found in the sigmoid colon.                            The polyps were 3 to 4 mm in size. These polyps                            were removed with a cold snare. Resection and                            retrieval were complete.                           The exam was otherwise without abnormality on                            direct and retroflexion views. Complications:            No immediate complications. Estimated blood loss:  None. Estimated Blood Loss:     Estimated blood loss: none. Impression:               - Two 3 to 4 mm polyps in the sigmoid colon,                            removed with a cold snare. Resected and retrieved.                           - The examination was otherwise normal on direct                            and retroflexion views. Recommendation:           - Patient has a contact number available for                            emergencies. The signs and symptoms of potential                            delayed complications were discussed with the                            patient. Return to normal activities tomorrow.                            Written discharge instructions were provided to the                            patient.                           - Resume previous diet.                           - Continue present medications.                            You will receive a letter within 2-3 weeks with the                            pathology results and my final recommendations.                           If the polyp(s) is proven to be 'pre-cancerous' on                            pathology, you will need repeat colonoscopy in 5                            years. If the polyp(s) is NOT 'precancerous' on                            pathology then you should repeat colon cancer  screening in 10 years with colonoscopy without need                            for colon cancer screening by any method prior to                            then (including stool testing). Milus Banister, MD 10/27/2017 2:36:12 PM This report has been signed electronically.

## 2017-10-28 ENCOUNTER — Telehealth: Payer: Self-pay

## 2017-10-28 NOTE — Telephone Encounter (Signed)
  Follow up Call-  Call back number 10/27/2017  Post procedure Call Back phone  # [phone]  Permission to leave phone message Yes  Some recent data might be hidden     Patient questions:  Do you have a fever, pain , or abdominal swelling? No. Pain Score  0 *  Have you tolerated food without any problems? Yes.    Have you been able to return to your normal activities? Yes.    Do you have any questions about your discharge instructions: Diet   No. Medications  No. Follow up visit  No.  Do you have questions or concerns about your Care? No.  Actions: * If pain score is 4 or above: No action needed, pain <4.

## 2017-10-30 DIAGNOSIS — E785 Hyperlipidemia, unspecified: Secondary | ICD-10-CM | POA: Diagnosis not present

## 2017-10-30 DIAGNOSIS — I25119 Atherosclerotic heart disease of native coronary artery with unspecified angina pectoris: Secondary | ICD-10-CM | POA: Diagnosis not present

## 2017-10-30 DIAGNOSIS — Z7902 Long term (current) use of antithrombotics/antiplatelets: Secondary | ICD-10-CM | POA: Diagnosis not present

## 2017-10-30 DIAGNOSIS — I252 Old myocardial infarction: Secondary | ICD-10-CM | POA: Diagnosis not present

## 2017-10-30 DIAGNOSIS — Z8249 Family history of ischemic heart disease and other diseases of the circulatory system: Secondary | ICD-10-CM | POA: Diagnosis not present

## 2017-10-30 DIAGNOSIS — Z8546 Personal history of malignant neoplasm of prostate: Secondary | ICD-10-CM | POA: Diagnosis not present

## 2017-10-30 DIAGNOSIS — Z809 Family history of malignant neoplasm, unspecified: Secondary | ICD-10-CM | POA: Diagnosis not present

## 2017-10-30 DIAGNOSIS — Z7982 Long term (current) use of aspirin: Secondary | ICD-10-CM | POA: Diagnosis not present

## 2017-10-30 DIAGNOSIS — Z823 Family history of stroke: Secondary | ICD-10-CM | POA: Diagnosis not present

## 2017-10-30 DIAGNOSIS — I10 Essential (primary) hypertension: Secondary | ICD-10-CM | POA: Diagnosis not present

## 2017-11-04 ENCOUNTER — Other Ambulatory Visit: Payer: Self-pay | Admitting: *Deleted

## 2017-11-04 MED ORDER — CLOPIDOGREL BISULFATE 75 MG PO TABS: 75.0000 mg | ORAL_TABLET | Freq: Every day | ORAL | 2 refills | Status: DC

## 2017-11-04 MED ORDER — AMLODIPINE BESYLATE 5 MG PO TABS: 5.0000 mg | ORAL_TABLET | Freq: Every day | ORAL | 2 refills | Status: DC

## 2017-11-05 ENCOUNTER — Encounter: Payer: Self-pay | Admitting: Gastroenterology

## 2017-11-08 ENCOUNTER — Other Ambulatory Visit: Payer: Self-pay | Admitting: Internal Medicine

## 2017-11-09 NOTE — Telephone Encounter (Signed)
Please review for refill. Thanks!  

## 2017-11-23 DIAGNOSIS — R69 Illness, unspecified: Secondary | ICD-10-CM | POA: Diagnosis not present

## 2017-11-30 DIAGNOSIS — R69 Illness, unspecified: Secondary | ICD-10-CM | POA: Diagnosis not present

## 2017-12-03 ENCOUNTER — Ambulatory Visit: Payer: Medicare HMO | Admitting: Internal Medicine

## 2017-12-03 ENCOUNTER — Encounter: Payer: Self-pay | Admitting: Internal Medicine

## 2017-12-03 VITALS — BP 118/60 | HR 66 | Ht 76.0 in | Wt 221.0 lb

## 2017-12-03 DIAGNOSIS — E785 Hyperlipidemia, unspecified: Secondary | ICD-10-CM | POA: Diagnosis not present

## 2017-12-03 DIAGNOSIS — I10 Essential (primary) hypertension: Secondary | ICD-10-CM

## 2017-12-03 DIAGNOSIS — I2510 Atherosclerotic heart disease of native coronary artery without angina pectoris: Secondary | ICD-10-CM

## 2017-12-03 NOTE — Progress Notes (Signed)
Follow-up Outpatient Visit Date: 12/03/2017  Primary Care Provider: Binnie Rail, MD [place] [id number]  Chief Complaint: Follow-up coronary artery disease and atypical chest pain  HPI:  [name] is a 72 y.o. year-old male with history ofcoronary artery disease with an STEMI in 2011 and 2015 status post PCI's mid LCx and LAD, respectively. He subsequently underwent PCI to the RCA in 2016. He also has a history of hypertension, hyperlipidemia, prostate cancer, CKD, and post catheterization TIA (2015). He presents today for follow-up of coronary artery disease. I last saw him in late November, which time he was feeling well with the exception of a few episodes of postprandial chest pain. He described a bubble like sensation in his chest that would resolve spontaneously or after taking Tums. He wished to defer noninvasive ischemia testing at that time.  Today, Mr. Stgermaine reports that he has been feeling well. He has not had any further indigestion for at least a month. He denies shortness of breath, palpitations, lightheadedness, orthopnea, PND, and edema. 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  --------------------------------------------------------------------------------------------------  Past Medical History: 1. Hypertension: Did not tolerate lisinopril even at low dose.  2. Hyperlipidemia 3. Mild anemia, B12 deficiency 4. Prostate cancer 5. Skin cancer 6. S/P TKR 7. CAD: NSTEMI 5/11. LHC showed 60% prox LAD, 70% mid LAD, 95% mid LCx with TIMI 2 flow distally, EF 60%. Patient had PROMUS DES to mLCx. NSTEMI (8/15), LHC showed 70% prox/50% mLAD, 75% ostial D1, 50-60% mRCA. LAD stenosis significant by FFR, RCA stenosis was not. Patient had DES to LAD. Echo (8/15) with EF 55-60%, mild MR.  - Unstable angina 4/16. LHC showed 50% mLAD, 90% mRCA. He had DES to [facility].  8. CKD 9. TIA post-PCI in 8/15.   Recent CV Pertinent Labs: Lab Results  Component Value Date   CHOL 122 09/01/2017   CHOL 122 (L) 02/07/2016   HDL 41 09/01/2017   HDL 38 (L) 02/07/2016   LDLCALC 52 09/01/2017   LDLCALC 61 02/07/2016   LDLDIRECT 68.8 07/20/2012   TRIG 147 09/01/2017   TRIG 113 02/07/2016   TRIG 220 (HH) 09/07/2006   CHOLHDL 3.0 09/01/2017   CHOLHDL 3 08/04/2016   INR 0.9 01/16/2015   K 4.1 09/01/2017   BUN 16 09/01/2017   CREATININE 1.36 (H) 09/01/2017   CREATININE 1.53 (H) 02/07/2016    Past medical and surgical history were reviewed and updated in EPIC.  Current Meds  Medication Sig  . amLODipine (NORVASC) 5 MG tablet Take 1 tablet (5 mg total) by mouth daily.  Marland Kitchen aspirin 81 MG tablet Take 81 mg by mouth 2 (two) times daily.   Marland Kitchen atorvastatin (LIPITOR) 20 MG tablet TAKE 1 TABLET BY MOUTH EVERY DAY. PLEASE KEEP UPCOMING APPOINTMENT FOR FUTURE REFILLS.  . calcium carbonate (TUMS - DOSED IN MG ELEMENTAL CALCIUM) 500 MG chewable tablet Chew 2 tablets by mouth daily as needed for indigestion or heartburn.   . clopidogrel (PLAVIX) 75 MG tablet Take 1 tablet (75 mg total) by mouth daily.  . Cyanocobalamin (VITAMIN B12 PO) Take 1 tablet by mouth every evening.  . fish oil-omega-3 fatty acids 1000 MG capsule Take 2 g by mouth 2 (two) times daily.    . metoprolol tartrate (LOPRESSOR) 50 MG tablet TAKE 1 TABLET BY MOUTH TWICE A DAY. PLEASE KEEP UPCOMING APPOINTMENT FOR FUTURE REFILLS.  Marland Kitchen nitroGLYCERIN (NITROSTAT) 0.4 MG SL tablet Place 1 tablet (0.4 mg total) under the tongue every  5 (five) minutes as needed for chest pain.  Marland Kitchen Potassium Gluconate 2 MEQ TABS Take 1 tablet by mouth daily.   Current Facility-Administered Medications for the 12/03/17 encounter (Office Visit) with Mahitha Hickling, Harrell Gave, MD  Medication  . 0.9 %  sodium chloride infusion    Allergies: Lidocaine  Social History   Socioeconomic History  . Marital status: Married    Spouse name: Not on file  . Number of children: Not on file  . Years of education: Not on file  . Highest education level:  Not on file  Social Needs  . Financial resource strain: Not on file  . Food insecurity - worry: Not on file  . Food insecurity - inability: Not on file  . Transportation needs - medical: Not on file  . Transportation needs - non-medical: Not on file  Occupational History  . Not on file  Tobacco Use  . Smoking status: Never Smoker  . Smokeless tobacco: Never Used  Substance and Sexual Activity  . Alcohol use: No    Alcohol/week: 0.0 oz  . Drug use: No  . Sexual activity: Not on file  Other Topics Concern  . Not on file  Social History Narrative  . Not on file    Family History  Problem Relation Age of Onset  . Coronary artery disease Mother        S/P CABG, in her 56s  . Transient ischemic attack Mother 14  . Heart disease Mother   . Prostate cancer Father   . Hypertension Brother   . Diabetes Sister     Review of Systems: A 12-system review of systems was performed and was negative except as noted in the HPI.  --------------------------------------------------------------------------------------------------  Physical Exam: BP 118/60   Pulse 66   Ht 6\' 4"  (1.93 m)   Wt 221 lb (100.2 kg)   SpO2 94%   BMI 26.90 kg/m   General:  Well-developed, well-nourished man, seated comfortably in the exam room. HEENT: No conjunctival pallor or scleral icterus. Moist mucous membranes.  OP clear. Neck: Supple without lymphadenopathy, thyromegaly, JVD, or HJR. Lungs: Normal work of breathing. Clear to auscultation bilaterally without wheezes or crackles. Heart: Regular rate and rhythm without murmurs, rubs, or gallops. Non-displaced PMI. Abd: Bowel sounds present. Soft, NT/ND without hepatosplenomegaly Ext: No lower extremity edema. Skin: Warm and dry without rash.  Lab Results  Component Value Date   WBC 4.9 09/01/2017   HGB 13.7 09/01/2017   HCT 42.2 09/01/2017   MCV 85 09/01/2017   PLT 164 09/01/2017    Lab Results  Component Value Date   NA 140 09/01/2017   K  4.1 09/01/2017   CL 102 09/01/2017   CO2 26 09/01/2017   BUN 16 09/01/2017   CREATININE 1.36 (H) 09/01/2017   GLUCOSE 110 (H) 09/01/2017   ALT 25 09/01/2017    Lab Results  Component Value Date   CHOL 122 09/01/2017   HDL 41 09/01/2017   LDLCALC 52 09/01/2017   LDLDIRECT 68.8 07/20/2012   TRIG 147 09/01/2017   CHOLHDL 3.0 09/01/2017    --------------------------------------------------------------------------------------------------  ASSESSMENT AND PLAN: Coronary artery disease without angina No significant chest pain for at least a month. Symptoms have been most consistent with acid reflux and resolved promptly with as needed antacid. Continue current medications for secondary prevention.  Hypertension Blood pressure is normal today. No further workup at this time.  Hyperlipidemia LDL 08/2017 was excellent at 52. Continue atorvastatin 20 mg daily.  Follow-up: Return to  clinic in 1 year.  Nelva Bush, MD 12/03/2017 3:39 PM

## 2017-12-03 NOTE — Patient Instructions (Addendum)
Medication Instructions:  Your physician recommends that you continue on your current medications as directed. Please refer to the Current Medication list given to you today.  -- If you need a refill on your cardiac medications before your next appointment, please call your pharmacy. --  Labwork: None ordered  Testing/Procedures: None ordered  Follow-Up: Your physician wants you to follow-up in: 1 year with Dr. End.    You will receive a reminder letter in the mail two months in advance. If you don't receive a letter, please call our office to schedule the follow-up appointment.  Thank you for choosing [facility]!!    Any Other Special Instructions Will Be Listed Below (If Applicable).         

## 2018-01-07 DIAGNOSIS — R69 Illness, unspecified: Secondary | ICD-10-CM | POA: Diagnosis not present

## 2018-05-27 DIAGNOSIS — R69 Illness, unspecified: Secondary | ICD-10-CM | POA: Diagnosis not present

## 2018-06-03 DIAGNOSIS — R69 Illness, unspecified: Secondary | ICD-10-CM | POA: Diagnosis not present

## 2018-06-21 DIAGNOSIS — R69 Illness, unspecified: Secondary | ICD-10-CM | POA: Diagnosis not present

## 2018-08-11 ENCOUNTER — Other Ambulatory Visit: Payer: Self-pay | Admitting: Internal Medicine

## 2018-08-11 NOTE — Telephone Encounter (Signed)
Please review for refill. Thanks!  

## 2018-08-18 ENCOUNTER — Ambulatory Visit (INDEPENDENT_AMBULATORY_CARE_PROVIDER_SITE_OTHER): Payer: Medicare HMO | Admitting: Family

## 2018-08-18 ENCOUNTER — Encounter: Payer: Self-pay | Admitting: Family

## 2018-08-18 VITALS — BP 122/72 | HR 91 | Temp 98.4°F | Ht 76.0 in | Wt 222.0 lb

## 2018-08-18 DIAGNOSIS — M109 Gout, unspecified: Secondary | ICD-10-CM | POA: Diagnosis not present

## 2018-08-18 DIAGNOSIS — K120 Recurrent oral aphthae: Secondary | ICD-10-CM | POA: Diagnosis not present

## 2018-08-18 DIAGNOSIS — L739 Follicular disorder, unspecified: Secondary | ICD-10-CM | POA: Diagnosis not present

## 2018-08-18 MED ORDER — HYDROCODONE-ACETAMINOPHEN 5-325 MG PO TABS: 1.0000 | ORAL_TABLET | Freq: Four times a day (QID) | ORAL | 0 refills | Status: DC | PRN

## 2018-08-18 MED ORDER — PREDNISONE 20 MG PO TABS: 40.0000 mg | ORAL_TABLET | Freq: Every day | ORAL | 0 refills | Status: DC

## 2018-08-18 MED ORDER — DOXYCYCLINE HYCLATE 100 MG PO TABS: 100.0000 mg | ORAL_TABLET | Freq: Two times a day (BID) | ORAL | 0 refills | Status: DC

## 2018-08-18 NOTE — Progress Notes (Signed)
[name] is a 72 y.o. male with the following history as recorded in EpicCare:  Patient Active Problem List   Diagnosis Date Noted  . Erectile dysfunction 08/04/2016  . S/P coronary artery stent placement, 01/19/15 S/p DES of mid RCA 01/20/2015  . Unstable angina ([place])   . Hyperglycemia 10/03/2014  . TIA (transient ischemic attack) 07/31/2014  . Cerebral infarction ([place]) 06/05/2014  . NSTEMI (non-ST elevated myocardial infarction) ([place]) 06/04/2014  . Chronic kidney disease (CKD), stage III (moderate) ([place]) 06/14/2010  . Coronary artery disease involving native coronary artery of native heart without angina pectoris 03/01/2010  . PERNICIOUS ANEMIA 04/24/2009  . TREMOR, ESSENTIAL, RIGHT HAND 04/24/2009  . PROSTATE CANCER, HX OF 04/24/2009  . Melanoma ([place]) 04/24/2009  . Hyperlipidemia LDL goal <70 10/28/2007  . Essential hypertension 10/13/2007    Current Outpatient Medications  Medication Sig Dispense Refill  . amLODipine (NORVASC) 5 MG tablet Take 1 tablet (5 mg total) by mouth daily. Please keep upcoming appt in February for future refills. Thank you 90 tablet 0  . aspirin 81 MG tablet Take 81 mg by mouth 2 (two) times daily.     Marland Kitchen atorvastatin (LIPITOR) 20 MG tablet TAKE 1 TABLET BY MOUTH EVERY DAY. PLEASE KEEP UPCOMING APPOINTMENT FOR FUTURE REFILLS. 90 tablet 0  . calcium carbonate (TUMS - DOSED IN MG ELEMENTAL CALCIUM) 500 MG chewable tablet Chew 2 tablets by mouth daily as needed for indigestion or heartburn.     . clopidogrel (PLAVIX) 75 MG tablet Take 1 tablet (75 mg total) by mouth daily. Please keep upcoming appt in February for future refills. Thank you 90 tablet 0  . Cyanocobalamin (VITAMIN B12 PO) Take 1 tablet by mouth every evening.    . fish oil-omega-3 fatty acids 1000 MG capsule Take 2 g by mouth 2 (two) times daily.      Marland Kitchen lidocaine (XYLOCAINE) 2 % solution     . metoprolol tartrate (LOPRESSOR) 50 MG tablet TAKE 1 TABLET BY MOUTH TWICE A DAY. PLEASE KEEP UPCOMING  APPOINTMENT FOR FUTURE REFILLS. 180 tablet 0  . nitroGLYCERIN (NITROSTAT) 0.4 MG SL tablet Place 1 tablet (0.4 mg total) under the tongue every 5 (five) minutes as needed for chest pain. 30 tablet 3  . Potassium Gluconate 2 MEQ TABS Take 1 tablet by mouth daily.    Marland Kitchen doxycycline (VIBRA-TABS) 100 MG tablet Take 1 tablet (100 mg total) by mouth 2 (two) times daily. 20 tablet 0  . HYDROcodone-acetaminophen (NORCO) 5-325 MG tablet Take 1 tablet by mouth every 6 (six) hours as needed for moderate pain. 30 tablet 0  . predniSONE (DELTASONE) 20 MG tablet Take 2 tablets (40 mg total) by mouth daily with breakfast. 10 tablet 0   Current Facility-Administered Medications  Medication Dose Route Frequency Provider Last Rate Last Dose  . 0.9 %  sodium chloride infusion  500 mL Intravenous Once Milus Banister, MD        Allergies: Lidocaine  Past Medical History:  Diagnosis Date  . Anemia, mild   . CAD (coronary artery disease)    a. 02/2010 NSTEMI/PCI: PROMUS DES to mRCA. b. 06/05/14 Cath/PCI: pLAD 70%--> s/p PCI/DES (Promus DES);  c. 01/2015 Cath/PCI: LM nl, LAD 53m, patent prox stent, LCX patent prox stent, RCA 30p, 63m (3.5x20 Synergy DES).  . CKD (chronic kidney disease), stage III ([place])   . Diastolic dysfunction    a. Echo 6/11: mild LVH, EF 55-60%, GR1DD, Trivial MR, mild LAE. b. echo 06/04/14: EF  55-60%, no WMA, GR1DD, Ao valve mildly calcifed, mild MR, mild LAE  . History of pulmonary embolism   . Hyperlipidemia   . Hypertension    did not tolerate Lisinopril event at low dose  . Melanoma ([place])    a. 1987. b. 2012  . Myocardial infarction ([place])   . Prostate cancer ([place])    Dr Karsten Ro  . Stroke ([place])   . TIA (transient ischemic attack)    a. post cath and PCI on 06/05/2014 - no residual sequelae.  . Vitamin B12 deficiency     Past Surgical History:  Procedure Laterality Date  . CHOLECYSTECTOMY    . COLONOSCOPY  2006  . KNEE ARTHROSCOPY     right  . LEFT HEART CATHETERIZATION WITH  CORONARY ANGIOGRAM N/A 06/05/2014   Procedure: LEFT HEART CATHETERIZATION WITH CORONARY ANGIOGRAM;  Surgeon: Blane Ohara, MD;  Location[facility] CATH LAB;  Service: Cardiovascular;  Laterality: N/A;  . LEFT HEART CATHETERIZATION WITH CORONARY ANGIOGRAM N/A 01/19/2015   Procedure: LEFT HEART CATHETERIZATION WITH CORONARY ANGIOGRAM;  Surgeon: Peter M Martinique, MD;  [facility] C[facility] LAB;  Service: Cardiovascular;  Laterality: N/A;  . MELANOMA EXCISION  2011    L chest  . MELANOMA EXCISION  1991   back  . mRCA stent  02/2010  . PROSTATECTOMY     Dr. Karsten Ro  . TONSILLECTOMY      Family History  Problem Relation Age of Onset  . Coronary artery disease Mother        S/P CABG, in her 15s  . Transient ischemic attack Mother 6  . Heart disease Mother   . Prostate cancer Father   . Hypertension Brother   . Diabetes Sister     Social History   Tobacco Use  . Smoking status: Never Smoker  . Smokeless tobacco: Never Used  Substance Use Topics  . Alcohol use: No    Alcohol/week: 0.0 standard drinks    Subjective:  Patient presents with concerns for possible gout flare; 1 day history of pain/ swelling in left foot; has had gout flare previously; cannot take NSAIDs due to Plavix; has been using OTC tart cherry extract;   Also concerned about "sore place" on back of left scalp; notes that area is sore to touch; has had facial cellulitis in the past;  Has sore place in roof of his mouth- has already contacted dentist and has started Magic Mouthwash in the past 24 hours with some relief.      Objective:  Vitals:   08/18/18 1358  BP: 122/72  Pulse: 91  Temp: 98.4 F (36.9 C)  TempSrc: Oral  SpO2: 96%  Weight: 222 lb (100.7 kg)  Height: 6\' 4"  (1.93 m)    General: Well developed, well nourished, in no acute distress  Skin : Warm and dry. Localized area of redness/ swelling noted at base of left scalp Head: Normocephalic and atraumatic  Eyes: Sclera and conjunctiva clear; pupils round  and reactive to light; extraocular movements intact  Ears: External normal; canals clear; tympanic membranes normal  Oropharynx: Pink, supple. No suspicious lesions; aphthous ulcer noted in roof of mouth Neck: Supple without thyromegaly, adenopathy  Lungs: Respirations unlabored; clear to auscultation bilaterally without wheeze, rales, rhonchi  CVS exam: normal rate and regular rhythm.  Musculoskeletal: No deformities; left foot redness/ swelling noted at base of 1st toe Extremities: No edema, cyanosis, clubbing  Vessels: Symmetric bilaterally  Neurologic: Alert and oriented; speech intact; face symmetrical; moves all extremities well; CNII-XII  intact without focal deficit   Assessment:  1. Acute gout of left foot, unspecified cause   2. Folliculitis   3. Aphthous ulcer     Plan:  1. Rx for Prednisone 40 mg qd x 5 days; continue tart cherry extract; follow-up worse, no better. 2. Rx for Doxycycline 100 mg bid x 10 days; follow-up worse, no better. 3. Reassurance; agree with treatment started by dentist yesterday.   Schedule for yearly CPE with Dr. Quay Burow in early December 2019.  No follow-ups on file.  No orders of the defined types were placed in this encounter.   Requested Prescriptions   Signed Prescriptions Disp Refills  . predniSONE (DELTASONE) 20 MG tablet 10 tablet 0    Sig: Take 2 tablets (40 mg total) by mouth daily with breakfast.  . HYDROcodone-acetaminophen (NORCO) 5-325 MG tablet 30 tablet 0    Sig: Take 1 tablet by mouth every 6 (six) hours as needed for moderate pain.  Marland Kitchen doxycycline (VIBRA-TABS) 100 MG tablet 20 tablet 0    Sig: Take 1 tablet (100 mg total) by mouth 2 (two) times daily.

## 2018-08-18 NOTE — Patient Instructions (Signed)

## 2018-09-13 NOTE — Progress Notes (Signed)
Subjective:    Patient ID: [name], male    DOB: [birth year], 72 y.o.   MRN: [id number]  HPI He is here for a physical exam.   He was treated for gout 11/13.  The symptoms resolved quickly.  The episode prior to that was years ago.    During the above visit he was also treated for folliculitis on his head with doxycycline.  He states that cleared up without difficulty.  He denies any other changes in his health since he was here last for his physical.  He has no concerns.  He is active with yard work and splitting wood, but does not do any formal exercise.  Medications and allergies reviewed with patient and updated if appropriate.  Patient Active Problem List   Diagnosis Date Noted  . Erectile dysfunction 08/04/2016  . S/P coronary artery stent placement, 01/19/15 S/p DES of mid RCA 01/20/2015  . Unstable angina ([place])   . Hyperglycemia 10/03/2014  . TIA (transient ischemic attack) 07/31/2014  . Cerebral infarction ([place]) 06/05/2014  . NSTEMI (non-ST elevated myocardial infarction) ([place]) 06/04/2014  . Chronic kidney disease (CKD), stage III (moderate) ([place]) 06/14/2010  . Coronary artery disease involving native coronary artery of native heart without angina pectoris 03/01/2010  . PERNICIOUS ANEMIA 04/24/2009  . TREMOR, ESSENTIAL, RIGHT HAND 04/24/2009  . PROSTATE CANCER, HX OF 04/24/2009  . Melanoma ([place]) 04/24/2009  . Hyperlipidemia LDL goal <70 10/28/2007  . Essential hypertension 10/13/2007    Current Outpatient Medications on File Prior to Visit  Medication Sig Dispense Refill  . amLODipine (NORVASC) 5 MG tablet Take 1 tablet (5 mg total) by mouth daily. Please keep upcoming appt in February for future refills. Thank you 90 tablet 0  . aspirin 81 MG tablet Take 81 mg by mouth 2 (two) times daily.     Marland Kitchen atorvastatin (LIPITOR) 20 MG tablet TAKE 1 TABLET BY MOUTH EVERY DAY. PLEASE KEEP UPCOMING APPOINTMENT FOR FUTURE REFILLS. 90 tablet 0  . calcium carbonate (TUMS -  DOSED IN MG ELEMENTAL CALCIUM) 500 MG chewable tablet Chew 2 tablets by mouth daily as needed for indigestion or heartburn.     . clopidogrel (PLAVIX) 75 MG tablet Take 1 tablet (75 mg total) by mouth daily. Please keep upcoming appt in February for future refills. Thank you 90 tablet 0  . Cyanocobalamin (VITAMIN B12 PO) Take 1 tablet by mouth every evening.    . fish oil-omega-3 fatty acids 1000 MG capsule Take 2 g by mouth 2 (two) times daily.      . metoprolol tartrate (LOPRESSOR) 50 MG tablet TAKE 1 TABLET BY MOUTH TWICE A DAY. PLEASE KEEP UPCOMING APPOINTMENT FOR FUTURE REFILLS. 180 tablet 0  . nitroGLYCERIN (NITROSTAT) 0.4 MG SL tablet Place 1 tablet (0.4 mg total) under the tongue every 5 (five) minutes as needed for chest pain. 30 tablet 3  . Potassium Gluconate 2 MEQ TABS Take 1 tablet by mouth daily.     No current facility-administered medications on file prior to visit.     Past Medical History:  Diagnosis Date  . Anemia, mild   . CAD (coronary artery disease)    a. 02/2010 NSTEMI/PCI: PROMUS DES to mRCA. b. 06/05/14 Cath/PCI: pLAD 70%--> s/p PCI/DES (Promus DES);  c. 01/2015 Cath/PCI: LM nl, LAD 15m, patent prox stent, LCX patent prox stent, RCA 30p, 45m (3.5x20 Synergy DES).  . CKD (chronic kidney disease), stage III ([place])   . Diastolic dysfunction    a.  Echo 6/11: mild LVH, EF 55-60%, GR1DD, Trivial MR, mild LAE. b. echo 06/04/14: EF 55-60%, no WMA, GR1DD, Ao valve mildly calcifed, mild MR, mild LAE  . History of pulmonary embolism   . Hyperlipidemia   . Hypertension    did not tolerate Lisinopril event at low dose  . Melanoma ([place])    a. 1987. b. 2012  . Myocardial infarction ([place])   . Prostate cancer ([place])    Dr Karsten Ro  . Stroke ([place])   . TIA (transient ischemic attack)    a. post cath and PCI on 06/05/2014 - no residual sequelae.  . Vitamin B12 deficiency     Past Surgical History:  Procedure Laterality Date  . CHOLECYSTECTOMY    . COLONOSCOPY  2006  . KNEE  ARTHROSCOPY     right  . LEFT HEART CATHETERIZATION WITH CORONARY ANGIOGRAM N/A 06/05/2014   Procedure: LEFT HEART CATHETERIZATION WITH CORONARY ANGIOGRAM;  Surgeon: Blane Ohara, MD;  Location: [facility] CATH LAB;  Service: Cardiovascular;  Laterality: N/A;  . LEFT HEART CATHETERIZATION WITH CORONARY ANGIOGRAM N/A 01/19/2015   Procedure: LEFT HEART CATHETERIZATION WITH CORONARY ANGIOGRAM;  Surgeon: Peter M Martinique, MD;  Location: [facility] CATH LAB;  Service: Cardiovascular;  Laterality: N/A;  . MELANOMA EXCISION  2011    L chest  . MELANOMA EXCISION  1991   back  . mRCA stent  02/2010  . PROSTATECTOMY     Dr. Karsten Ro  . TONSILLECTOMY      Social History   Socioeconomic History  . Marital status: Married    Spouse name: Not on file  . Number of children: Not on file  . Years of education: Not on file  . Highest education level: Not on file  Occupational History  . Not on file  Social Needs  . Financial resource strain: Not on file  . Food insecurity:    Worry: Not on file    Inability: Not on file  . Transportation needs:    Medical: Not on file    Non-medical: Not on file  Tobacco Use  . Smoking status: Never Smoker  . Smokeless tobacco: Never Used  Substance and Sexual Activity  . Alcohol use: No    Alcohol/week: 0.0 standard drinks  . Drug use: No  . Sexual activity: Not on file  Lifestyle  . Physical activity:    Days per week: Not on file    Minutes per session: Not on file  . Stress: Not on file  Relationships  . Social connections:    Talks on phone: Not on file    Gets together: Not on file    Attends religious service: Not on file    Active member of club or organization: Not on file    Attends meetings of clubs or organizations: Not on file    Relationship status: Not on file  Other Topics Concern  . Not on file  Social History Narrative  . Not on file    Family History  Problem Relation Age of Onset  . Coronary artery disease Mother        S/P CABG, in  her 63s  . Transient ischemic attack Mother 44  . Heart disease Mother   . Prostate cancer Father   . Hypertension Brother   . Diabetes Sister     Review of Systems  Constitutional: Negative for chills and fever.  Eyes: Negative for visual disturbance.  Respiratory: Negative for cough, shortness of breath and wheezing.   Cardiovascular:  Negative for chest pain, palpitations and leg swelling.  Gastrointestinal: Positive for constipation (stool softener). Negative for abdominal pain, blood in stool, diarrhea and nausea.       No gerd  Genitourinary: Negative for dysuria and hematuria.  Musculoskeletal: Positive for arthralgias (mild, stiff).  Skin: Negative for color change and rash.  Neurological: Negative for dizziness, light-headedness and headaches.  Psychiatric/Behavioral: Negative for dysphoric mood. The patient is not nervous/anxious.        Objective:   Vitals:   09/14/18 1037  BP: 140/82  Pulse: 69  Resp: 16  Temp: 98.6 F (37 C)  SpO2: 96%   Filed Weights   09/14/18 1037  Weight: 222 lb (100.7 kg)   Body mass index is 27.02 kg/m.  Wt Readings from Last 3 Encounters:  09/14/18 222 lb (100.7 kg)  08/18/18 222 lb (100.7 kg)  12/03/17 221 lb (100.2 kg)     Physical Exam Constitutional: He appears well-developed and well-nourished. No distress.  HENT:  Head: Normocephalic and atraumatic.  Right Ear: External ear normal.  Left Ear: External ear normal.  Mouth/Throat: Oropharynx is clear and moist.  Normal ear canals and TM b/l  Eyes: Conjunctivae and EOM are normal.  Neck: Neck supple. No tracheal deviation present. No thyromegaly present.  No carotid bruit  Cardiovascular: Normal rate, regular rhythm, normal heart sounds and intact distal pulses.  No murmur heard.  No edema Pulmonary/Chest: Effort normal and breath sounds normal. No respiratory distress. He has no wheezes. He has no rales.  Abdominal: Soft.  Small umbilical hernia-reducible and  nontender.  He exhibits no distension. There is no tenderness.  Genitourinary: deferred  Lymphadenopathy:   He has no cervical adenopathy.  Skin: Skin is warm and dry. He is not diaphoretic.  Psychiatric: He has a normal mood and affect. His behavior is normal.         Assessment & Plan:   Physical exam: Screening blood work ordered Immunizations  Flu vaccine deferred, prevnar deferred, discussed shingrix Colonoscopy   Up to date  Eye exams  - due - will schedule EKG  Done 08/2017 Exercise  Splits wood, yard work - no formal exercise Weight  Mildly overweight - ok for age Skin   Sees derm this month - h/o melanoma Substance abuse    none  See Problem List for Assessment and Plan of chronic medical problems.   Follow-up annually

## 2018-09-13 NOTE — Patient Instructions (Addendum)
Tests ordered today. Your results will be released to [place] (or called to you) after review, usually within 72hours after test completion. If any changes need to be made, you will be notified at that same time.  All other Health Maintenance issues reviewed.   All recommended immunizations and age-appropriate screenings are up-to-date or discussed.  No immunizations administered today.   Medications reviewed and updated.  Changes include :   none    Please followup in one year    Health Maintenance, Male A healthy lifestyle and preventive care is important for your health and wellness. Ask your health care provider about what schedule of regular examinations is right for you. What should I know about weight and diet? Eat a Healthy Diet  Eat plenty of vegetables, fruits, whole grains, low-fat dairy products, and lean protein.  Do not eat a lot of foods high in solid fats, added sugars, or salt.  Maintain a Healthy Weight Regular exercise can help you achieve or maintain a healthy weight. You should:  Do at least 150 minutes of exercise each week. The exercise should increase your heart rate and make you sweat (moderate-intensity exercise).  Do strength-training exercises at least twice a week.  Watch Your Levels of Cholesterol and Blood Lipids  Have your blood tested for lipids and cholesterol every 5 years starting at 74 years of age. If you are at high risk for heart disease, you should start having your blood tested when you are 73 years old. You may need to have your cholesterol levels checked more often if: ? Your lipid or cholesterol levels are high. ? You are older than 74 years of age. ? You are at high risk for heart disease.  What should I know about cancer screening? Many types of cancers can be detected early and may often be prevented. Lung Cancer  You should be screened every year for lung cancer if: ? You are a current smoker who has smoked for at least 30  years. ? You are a former smoker who has quit within the past 15 years.  Talk to your health care provider about your screening options, when you should start screening, and how often you should be screened.  Colorectal Cancer  Routine colorectal cancer screening usually begins at 74 years of age and should be repeated every 5-10 years until you are 74 years old. You may need to be screened more often if early forms of precancerous polyps or small growths are found. Your health care provider may recommend screening at an earlier age if you have risk factors for colon cancer.  Your health care provider may recommend using home test kits to check for hidden blood in the stool.  A small camera at the end of a tube can be used to examine your colon (sigmoidoscopy or colonoscopy). This checks for the earliest forms of colorectal cancer.  Prostate and Testicular Cancer  Depending on your age and overall health, your health care provider may do certain tests to screen for prostate and testicular cancer.  Talk to your health care provider about any symptoms or concerns you have about testicular or prostate cancer.  Skin Cancer  Check your skin from head to toe regularly.  Tell your health care provider about any new moles or changes in moles, especially if: ? There is a change in a mole's size, shape, or color. ? You have a mole that is larger than a pencil eraser.  Always use sunscreen. Apply sunscreen  liberally and repeat throughout the day.  Protect yourself by wearing long sleeves, pants, a wide-brimmed hat, and sunglasses when outside.  What should I know about heart disease, diabetes, and high blood pressure?  If you are 87-38 years of age, have your blood pressure checked every 3-5 years. If you are 89 years of age or older, have your blood pressure checked every year. You should have your blood pressure measured twice-once when you are at a hospital or clinic, and once when you are  not at a hospital or clinic. Record the average of the two measurements. To check your blood pressure when you are not at a hospital or clinic, you can use: ? An automated blood pressure machine at a pharmacy. ? A home blood pressure monitor.  Talk to your health care provider about your target blood pressure.  If you are between 11-56 years old, ask your health care provider if you should take aspirin to prevent heart disease.  Have regular diabetes screenings by checking your fasting blood sugar level. ? If you are at a normal weight and have a low risk for diabetes, have this test once every three years after the age of 71. ? If you are overweight and have a high risk for diabetes, consider being tested at a younger age or more often.  A one-time screening for abdominal aortic aneurysm (AAA) by ultrasound is recommended for men aged 55-75 years who are current or former smokers. What should I know about preventing infection? Hepatitis B If you have a higher risk for hepatitis B, you should be screened for this virus. Talk with your health care provider to find out if you are at risk for hepatitis B infection. Hepatitis C Blood testing is recommended for:  Everyone born from [birth year] through 1967.  Anyone with known risk factors for hepatitis C.  Sexually Transmitted Diseases (STDs)  You should be screened each year for STDs including gonorrhea and chlamydia if: ? You are sexually active and are younger than 74 years of age. ? You are older than 74 years of age and your health care provider tells you that you are at risk for this type of infection. ? Your sexual activity has changed since you were last screened and you are at an increased risk for chlamydia or gonorrhea. Ask your health care provider if you are at risk.  Talk with your health care provider about whether you are at high risk of being infected with HIV. Your health care provider may recommend a prescription medicine to help  prevent HIV infection.  What else can I do?  Schedule regular health, dental, and eye exams.  Stay current with your vaccines (immunizations).  Do not use any tobacco products, such as cigarettes, chewing tobacco, and e-cigarettes. If you need help quitting, ask your health care provider.  Limit alcohol intake to no more than 2 drinks per day. One drink equals 12 ounces of beer, 5 ounces of wine, or 1 ounces of hard liquor.  Do not use street drugs.  Do not share needles.  Ask your health care provider for help if you need support or information about quitting drugs.  Tell your health care provider if you often feel depressed.  Tell your health care provider if you have ever been abused or do not feel safe at home. This information is not intended to replace advice given to you by your health care provider. Make sure you discuss any questions you have with your  health care provider. Document Released: 03/20/2008 Document Revised: 05/21/2016 Document Reviewed: 06/26/2015 Elsevier Interactive Patient Education  [employer].

## 2018-09-14 ENCOUNTER — Other Ambulatory Visit (INDEPENDENT_AMBULATORY_CARE_PROVIDER_SITE_OTHER): Payer: Medicare HMO

## 2018-09-14 ENCOUNTER — Encounter: Payer: Self-pay | Admitting: Internal Medicine

## 2018-09-14 ENCOUNTER — Ambulatory Visit (INDEPENDENT_AMBULATORY_CARE_PROVIDER_SITE_OTHER): Payer: Medicare HMO | Admitting: Internal Medicine

## 2018-09-14 VITALS — BP 140/82 | HR 69 | Temp 98.6°F | Resp 16 | Ht 76.0 in | Wt 222.0 lb

## 2018-09-14 DIAGNOSIS — R739 Hyperglycemia, unspecified: Secondary | ICD-10-CM

## 2018-09-14 DIAGNOSIS — I10 Essential (primary) hypertension: Secondary | ICD-10-CM

## 2018-09-14 DIAGNOSIS — Z0000 Encounter for general adult medical examination without abnormal findings: Secondary | ICD-10-CM

## 2018-09-14 DIAGNOSIS — K429 Umbilical hernia without obstruction or gangrene: Secondary | ICD-10-CM | POA: Insufficient documentation

## 2018-09-14 DIAGNOSIS — E785 Hyperlipidemia, unspecified: Secondary | ICD-10-CM

## 2018-09-14 DIAGNOSIS — N183 Chronic kidney disease, stage 3 unspecified: Secondary | ICD-10-CM

## 2018-09-14 DIAGNOSIS — I2510 Atherosclerotic heart disease of native coronary artery without angina pectoris: Secondary | ICD-10-CM

## 2018-09-14 DIAGNOSIS — Z8546 Personal history of malignant neoplasm of prostate: Secondary | ICD-10-CM | POA: Diagnosis not present

## 2018-09-14 DIAGNOSIS — C4359 Malignant melanoma of other part of trunk: Secondary | ICD-10-CM

## 2018-09-14 DIAGNOSIS — E538 Deficiency of other specified B group vitamins: Secondary | ICD-10-CM | POA: Diagnosis not present

## 2018-09-14 LAB — CBC WITH DIFFERENTIAL/PLATELET
Basophils Absolute: 0.1 10*3/uL (ref 0.0–0.1)
Basophils Relative: 1.1 % (ref 0.0–3.0)
Eosinophils Absolute: 0.2 10*3/uL (ref 0.0–0.7)
Eosinophils Relative: 3.7 % (ref 0.0–5.0)
HCT: 42 % (ref 39.0–52.0)
HEMOGLOBIN: 14.3 g/dL (ref 13.0–17.0)
Lymphocytes Relative: 29.9 % (ref 12.0–46.0)
Lymphs Abs: 1.5 10*3/uL (ref 0.7–4.0)
MCHC: 34.1 g/dL (ref 30.0–36.0)
MCV: 83.1 fl (ref 78.0–100.0)
Monocytes Absolute: 0.4 10*3/uL (ref 0.1–1.0)
Monocytes Relative: 8.6 % (ref 3.0–12.0)
Neutro Abs: 2.9 10*3/uL (ref 1.4–7.7)
Neutrophils Relative %: 56.7 % (ref 43.0–77.0)
Platelets: 145 10*3/uL — ABNORMAL LOW (ref 150.0–400.0)
RBC: 5.06 Mil/uL (ref 4.22–5.81)
RDW: 14.8 % (ref 11.5–15.5)
WBC: 5.1 10*3/uL (ref 4.0–10.5)

## 2018-09-14 LAB — LIPID PANEL
Cholesterol: 134 mg/dL (ref 0–200)
HDL: 42.1 mg/dL (ref >39.00)
LDL Cholesterol: 67 mg/dL (ref 0–99)
NonHDL: 91.75
Total CHOL/HDL Ratio: 3
Triglycerides: 122 mg/dL (ref 0.0–149.0)
VLDL: 24.4 mg/dL (ref 0.0–40.0)

## 2018-09-14 LAB — COMPREHENSIVE METABOLIC PANEL
ALK PHOS: 52 U/L (ref 39–117)
ALT: 19 U/L (ref 0–53)
AST: 20 U/L (ref 0–37)
Albumin: 4.4 g/dL (ref 3.5–5.2)
BUN: 23 mg/dL (ref 6–23)
CALCIUM: 9.4 mg/dL (ref 8.4–10.5)
CO2: 28 mEq/L (ref 19–32)
Chloride: 106 mEq/L (ref 96–112)
Creatinine, Ser: 1.58 mg/dL — ABNORMAL HIGH (ref 0.40–1.50)
GFR: 45.75 mL/min — ABNORMAL LOW (ref >60.00)
Glucose, Bld: 97 mg/dL (ref 70–99)
Potassium: 4.7 mEq/L (ref 3.5–5.1)
Sodium: 140 mEq/L (ref 135–145)
Total Bilirubin: 0.7 mg/dL (ref 0.2–1.2)
Total Protein: 6.4 g/dL (ref 6.0–8.3)

## 2018-09-14 LAB — TSH: TSH: 4.21 u[IU]/mL (ref 0.35–4.50)

## 2018-09-14 LAB — HEMOGLOBIN A1C: Hgb A1c MFr Bld: 5.7 % (ref 4.6–6.5)

## 2018-09-14 LAB — PSA, MEDICARE: PSA: 0.01 ng/ml — ABNORMAL LOW (ref 0.10–4.00)

## 2018-09-14 NOTE — Assessment & Plan Note (Signed)
Check A1c. 

## 2018-09-14 NOTE — Assessment & Plan Note (Signed)
Sees dermatology annually

## 2018-09-14 NOTE — Assessment & Plan Note (Signed)
Small umbilical hernia-asymptomatic Likely a result of prior laparoscopic surgery for gallbladder removal Monitor only

## 2018-09-14 NOTE — Assessment & Plan Note (Signed)
CMP

## 2018-09-14 NOTE — Assessment & Plan Note (Signed)
Check lipid panel, CMP, TSH Continue daily statin Regular exercise and healthy diet encouraged  

## 2018-09-14 NOTE — Assessment & Plan Note (Signed)
Taking B12 daily 

## 2018-09-14 NOTE — Assessment & Plan Note (Signed)
No longer following with urology Will check PSA

## 2018-09-14 NOTE — Assessment & Plan Note (Signed)
BP well controlled Current regimen effective and well tolerated Continue current medications at current doses cmp  

## 2018-09-14 NOTE — Assessment & Plan Note (Signed)
Following with cardiology Has some slight decreased stamina, but feels it may be related to his age and improves with rest Still splitting wood and doing yard work Advised him to monitor closely-has upcoming appointment with cardiology Continue Plavix, aspirin, atorvastatin and metoprolol CBC, CMP, lipid panel

## 2018-09-15 ENCOUNTER — Encounter: Payer: Self-pay | Admitting: Internal Medicine

## 2018-09-23 DIAGNOSIS — L814 Other melanin hyperpigmentation: Secondary | ICD-10-CM | POA: Diagnosis not present

## 2018-09-23 DIAGNOSIS — L570 Actinic keratosis: Secondary | ICD-10-CM | POA: Diagnosis not present

## 2018-09-23 DIAGNOSIS — L821 Other seborrheic keratosis: Secondary | ICD-10-CM | POA: Diagnosis not present

## 2018-09-23 DIAGNOSIS — L308 Other specified dermatitis: Secondary | ICD-10-CM | POA: Diagnosis not present

## 2018-09-23 DIAGNOSIS — D1801 Hemangioma of skin and subcutaneous tissue: Secondary | ICD-10-CM | POA: Diagnosis not present

## 2018-09-23 DIAGNOSIS — L918 Other hypertrophic disorders of the skin: Secondary | ICD-10-CM | POA: Diagnosis not present

## 2018-11-07 ENCOUNTER — Other Ambulatory Visit: Payer: Self-pay | Admitting: Internal Medicine

## 2018-11-08 NOTE — Telephone Encounter (Signed)
Please review for refill, Thanks !  

## 2018-11-15 ENCOUNTER — Telehealth: Payer: Self-pay | Admitting: Internal Medicine

## 2018-11-15 NOTE — Telephone Encounter (Signed)
Copied from [place] [id number]. Topic: Quick Communication - Rx Refill/Question >> Nov 15, 2018  3:25 PM Reyne Dumas L wrote: Medication: doxycycline (VIBRA-TABS) 100 MG tablet [[id number]]  Has the patient contacted their pharmacy? Yes - no refill left.  Pt states that he is having another gout flare in right foot, big toe - states flare started Saturday (Agent: If no, request that the patient contact the pharmacy for the refill.) (Agent: If yes, when and what did the pharmacy advise?)  Preferred Pharmacy (with phone number or street name): [id number] - [place], [place] - [address] [phone] (Phone) [phone] (Fax)  Agent: Please be advised that RX refills may take up to 3 business days. We ask that you follow-up with your pharmacy.

## 2018-11-15 NOTE — Telephone Encounter (Signed)
Will you please have pt make an acute appointment about this issue.

## 2018-11-15 NOTE — Telephone Encounter (Signed)
Appointment scheduled.

## 2018-11-16 ENCOUNTER — Ambulatory Visit (INDEPENDENT_AMBULATORY_CARE_PROVIDER_SITE_OTHER): Payer: Medicare HMO | Admitting: Family

## 2018-11-16 ENCOUNTER — Encounter: Payer: Self-pay | Admitting: Family

## 2018-11-16 VITALS — BP 130/74 | HR 73 | Temp 97.5°F | Ht 76.0 in | Wt 223.1 lb

## 2018-11-16 DIAGNOSIS — M109 Gout, unspecified: Secondary | ICD-10-CM

## 2018-11-16 MED ORDER — PREDNISONE 20 MG PO TABS: 40.0000 mg | ORAL_TABLET | Freq: Every day | ORAL | 0 refills | Status: DC

## 2018-11-16 NOTE — Progress Notes (Signed)
[name] is a 73 y.o. male with the following history as recorded in EpicCare:  Patient Active Problem List   Diagnosis Date Noted  . Umbilical hernia without obstruction or gangrene 09/14/2018  . B12 deficiency 09/14/2018  . Erectile dysfunction 08/04/2016  . S/P coronary artery stent placement, 01/19/15 S/p DES of mid RCA 01/20/2015  . Prediabetes 10/03/2014  . TIA (transient ischemic attack) 07/31/2014  . Cerebral infarction ([place]) 06/05/2014  . NSTEMI (non-ST elevated myocardial infarction) ([place]) 06/04/2014  . Chronic kidney disease (CKD), stage III (moderate) ([place]) 06/14/2010  . Coronary artery disease involving native coronary artery of native heart without angina pectoris 03/01/2010  . TREMOR, ESSENTIAL, RIGHT HAND 04/24/2009  . PROSTATE CANCER, HX OF 04/24/2009  . Melanoma ([place]) 04/24/2009  . Hyperlipidemia LDL goal <70 10/28/2007  . Essential hypertension 10/13/2007    Current Outpatient Medications  Medication Sig Dispense Refill  . amLODipine (NORVASC) 5 MG tablet TAKE 1 TABLET (5 MG TOTAL) BY MOUTH DAILY. PLEASE KEEP UPCOMING APPT IN FEBRUARY FOR FUTURE REFILLS. 90 tablet 0  . aspirin 81 MG tablet Take 81 mg by mouth 2 (two) times daily.     Marland Kitchen atorvastatin (LIPITOR) 20 MG tablet TAKE 1 TABLET BY MOUTH EVERY DAY. PLEASE KEEP UPCOMING APPOINTMENT FOR FUTURE REFILLS. 90 tablet 0  . calcium carbonate (TUMS - DOSED IN MG ELEMENTAL CALCIUM) 500 MG chewable tablet Chew 2 tablets by mouth daily as needed for indigestion or heartburn.     . clopidogrel (PLAVIX) 75 MG tablet TAKE 1 TABLET (75 MG TOTAL) BY MOUTH DAILY. PLEASE KEEP UPCOMING APPT IN FEBRUARY FOR FUTURE REFILLS 90 tablet 0  . Cyanocobalamin (VITAMIN B12 PO) Take 1 tablet by mouth every evening.    . fish oil-omega-3 fatty acids 1000 MG capsule Take 2 g by mouth 2 (two) times daily.      . metoprolol tartrate (LOPRESSOR) 50 MG tablet TAKE 1 TABLET BY MOUTH TWICE A DAY. PLEASE KEEP UPCOMING APPOINTMENT FOR FUTURE REFILLS.  180 tablet 0  . nitroGLYCERIN (NITROSTAT) 0.4 MG SL tablet Place 1 tablet (0.4 mg total) under the tongue every 5 (five) minutes as needed for chest pain. 30 tablet 3  . Potassium Gluconate 2 MEQ TABS Take 1 tablet by mouth daily.    . predniSONE (DELTASONE) 20 MG tablet Take 2 tablets (40 mg total) by mouth daily with breakfast. 10 tablet 0   No current facility-administered medications for this visit.     Allergies: Lidocaine  Past Medical History:  Diagnosis Date  . Anemia, mild   . CAD (coronary artery disease)    a. 02/2010 NSTEMI/PCI: PROMUS DES to mRCA. b. 06/05/14 Cath/PCI: pLAD 70%--> s/p PCI/DES (Promus DES);  c. 01/2015 Cath/PCI: LM nl, LAD 37m, patent prox stent, LCX patent prox stent, RCA 30p, 59m (3.5x20 Synergy DES).  . CKD (chronic kidney disease), stage III ([place])   . Diastolic dysfunction    a. Echo 6/11: mild LVH, EF 55-60%, GR1DD, Trivial MR, mild LAE. b. echo 06/04/14: EF 55-60%, no WMA, GR1DD, Ao valve mildly calcifed, mild MR, mild LAE  . History of pulmonary embolism   . Hyperlipidemia   . Hypertension    did not tolerate Lisinopril event at low dose  . Melanoma ([place])    a. 1987. b. 2012  . Myocardial infarction ([place])   . Prostate cancer ([place])    Dr Karsten Ro  . Stroke ([place])   . TIA (transient ischemic attack)    a. post cath and PCI  on 06/05/2014 - no residual sequelae.  . Vitamin B12 deficiency     Past Surgical History:  Procedure Laterality Date  . CHOLECYSTECTOMY    . COLONOSCOPY  2006  . KNEE ARTHROSCOPY     right  . LEFT HEART CATHETERIZATION WITH CORONARY ANGIOGRAM N/A 06/05/2014   Procedure: LEFT HEART CATHETERIZATION WITH CORONARY ANGIOGRAM;  Surgeon: Blane Ohara, MD;  Location: [facility] CATH LAB;  Service: Cardiovascular;  Laterality: N/A;  . LEFT HEART CATHETERIZATION WITH CORONARY ANGIOGRAM N/A 01/19/2015   Procedure: LEFT HEART CATHETERIZATION WITH CORONARY ANGIOGRAM;  Surgeon: Peter M Martinique, MD;  Location: [facility] CATH LAB;  Service: Cardiovascular;   Laterality: N/A;  . MELANOMA EXCISION  2011    L chest  . MELANOMA EXCISION  1991   back  . mRCA stent  02/2010  . PROSTATECTOMY     Dr. Karsten Ro  . TONSILLECTOMY      Family History  Problem Relation Age of Onset  . Coronary artery disease Mother        S/P CABG, in her 29s  . Transient ischemic attack Mother 21  . Heart disease Mother   . Prostate cancer Father   . Hypertension Brother   . Diabetes Sister     Social History   Tobacco Use  . Smoking status: Never Smoker  . Smokeless tobacco: Never Used  Substance Use Topics  . Alcohol use: No    Alcohol/week: 0.0 standard drinks    Subjective:  Presents with concerns for gout flare; history of gout- last flare was in November/ previous flare had been "years" ago; has been taking tart cherry extract- trying to take more in the past 24 hours and feels like it has helped with symptoms already; cannot take NSAIDs due to being on Plavix;     Objective:  Vitals:   11/16/18 1122  BP: 130/74  Pulse: 73  Temp: (!) 97.5 F (36.4 C)  TempSrc: Oral  SpO2: 99%  Weight: 223 lb 1.3 oz (101.2 kg)  Height: 6\' 4"  (1.93 m)    General: Well developed, well nourished, in no acute distress  Skin : Warm and dry. Erythematous localized area at left toe foot Head: Normocephalic and atraumatic  Eyes: Sclera and conjunctiva clear; pupils round and reactive to light; extraocular movements intact  Ears: External normal; canals clear; tympanic membranes normal  Oropharynx: Pink, supple. No suspicious lesions  Neck: Supple without thyromegaly, adenopathy  Lungs: Respirations unlabored;  Neurologic: Alert and oriented; speech intact; face symmetrical; moves all extremities well; CNII-XII intact without focal deficit   Assessment:  1. Acute gout of right foot, unspecified cause     Plan:  Rx for Prednisone 40 mg qd x 5 days; increase fluids specifically water; continue tart cherry extract; plan for uric acid check in 2 weeks.   No  follow-ups on file.  Orders Placed This Encounter  Procedures  . Uric acid    Standing Status:   Future    Standing Expiration Date:   11/16/2019    Requested Prescriptions   Signed Prescriptions Disp Refills  . predniSONE (DELTASONE) 20 MG tablet 10 tablet 0    Sig: Take 2 tablets (40 mg total) by mouth daily with breakfast.

## 2018-12-02 NOTE — Progress Notes (Signed)
Cardiology Office Note:    Date:  12/03/2018   ID:  [name], DOB [birth year], MRN [id number]  PCP:  Binnie Rail, MD  Cardiologist:  Nelva Bush, MD >> will est with Dr. Acie Fredrickson / [name], PA-C  Electrophysiologist:  None   Referring MD: Binnie Rail, MD   Chief Complaint  Patient presents with  . Coronary Artery Disease    Follow up     History of Present Illness:    [name] is a 73 y.o. male with CAD s/p NSTEMI in 02/2010 tx with DES to the mid RCA and NSTEMI in 2015 tx with DES to the proximal LAD (c/b post cath TIA).  His last LHC was in 4/16 in the setting of [place] and demonstrated severe mid RCA stenosis that was treated with a DES.  Other problems include hypertension, hyperlipidemia, renal insufficiency, prostate CA.  He is a prior patient of Dr. Aundra Dubin and last saw Dr. Saunders Revel in 11/2017.      Mr. Daubert returns for follow up.  He is here alone.  He denies chest pain, shortness of breath, syncope, paroxysmal nocturnal dyspnea, edema.  He has felt tired for some time now.  He admits to a hx of snoring and daytime hypersomnolence.    Prior CV studies:   The following studies were reviewed today:  Cardiac Catheterization 01/19/15 Left mainstem: Normal Left anterior descending (LAD): The stent in the proximal LAD is widely patent. There is a 50% stenosis in the mid vessel.  Left circumflex (LCx): There is a stent in the proximal LCx and it is widely patent. No obstructive disease. Right coronary artery (RCA): The RCA is a large dominant vessel. It is tortuous. There is diffuse 30% disease in the proximal vessel. There is a focal 90% stenosis in the mid vessel.  Left ventriculography: Not done due to CKD.  PCI:  3.5 x 20 mm Synergy DES to mid RCA  Echo 06/04/14 EF 55-60, no RWMA, Gr 1 DD, mild AoV calcification, mild MR, mild LAE   Past Medical History:  Diagnosis Date  . Anemia, mild   . CAD (coronary artery disease)    a. 02/2010 NSTEMI/PCI: PROMUS DES to mRCA.  b. 06/05/14 Cath/PCI: pLAD 70%--> s/p PCI/DES (Promus DES);  c. 01/2015 Cath/PCI: LM nl, LAD 21m, patent prox stent, LCX patent prox stent, RCA 30p, 24m (3.5x20 Synergy DES).  . CKD (chronic kidney disease), stage III ([place])   . Diastolic dysfunction    a. Echo 6/11: mild LVH, EF 55-60%, GR1DD, Trivial MR, mild LAE. b. echo 06/04/14: EF 55-60%, no WMA, GR1DD, Ao valve mildly calcifed, mild MR, mild LAE  . History of pulmonary embolism   . Hyperlipidemia   . Hypertension    did not tolerate Lisinopril event at low dose  . Melanoma ([place])    a. 1987. b. 2012  . Myocardial infarction ([place])   . Persistent atrial fibrillation 12/03/2018   Dx 11/2018 >> Apixaban started  . Prostate cancer ([place])    Dr Karsten Ro  . Stroke ([place])   . TIA (transient ischemic attack)    a. post cath and PCI on 06/05/2014 - no residual sequelae.  . Vitamin B12 deficiency   1. Hypertension: Did not tolerate lisinopril even at low dose.  2. Hyperlipidemia 3. Mild anemia, B12 deficiency 4. Prostate cancer 5. Skin cancer 6. S/P TKR 7. CAD: NSTEMI 5/11. LHC showed 60% prox LAD, 70% mid LAD, 95% mid LCx with TIMI 2 flow  distally, EF 60%. Patient had PROMUS DES to mLCx. NSTEMI (8/15), LHC showed 70% prox/50% mLAD, 75% ostial D1, 50-60% mRCA. LAD stenosis significant by FFR, RCA stenosis was not. Patient had DES to LAD. Echo (8/15) with EF 55-60%, mild MR.  - Unstable angina 4/16. LHC showed 50% mLAD, 90% mRCA. He had DES to [facility].  8. CKD 9. TIA post-PCI in 8/15.   Surgical Hx: The patient  has a past surgical history that includes Cholecystectomy; Prostatectomy; Knee arthroscopy; Tonsillectomy; mRCA stent (02/2010); Melanoma excision (2011 ); Melanoma excision (1991); left heart catheterization with coronary angiogram (N/A, 06/05/2014); left heart catheterization with coronary angiogram (N/A, 01/19/2015); and Colonoscopy (2006).   Current Medications: Current Meds  Medication Sig  . amLODipine (NORVASC) 5 MG tablet  TAKE 1 TABLET (5 MG TOTAL) BY MOUTH DAILY. PLEASE KEEP UPCOMING APPT IN FEBRUARY FOR FUTURE REFILLS.  Marland Kitchen aspirin 81 MG tablet Take 81 mg by mouth 2 (two) times daily.   Marland Kitchen atorvastatin (LIPITOR) 20 MG tablet TAKE 1 TABLET BY MOUTH EVERY DAY. PLEASE KEEP UPCOMING APPOINTMENT FOR FUTURE REFILLS.  . calcium carbonate (TUMS - DOSED IN MG ELEMENTAL CALCIUM) 500 MG chewable tablet Chew 2 tablets by mouth daily as needed for indigestion or heartburn.   . Cyanocobalamin (VITAMIN B12 PO) Take 1 tablet by mouth every evening.  . fish oil-omega-3 fatty acids 1000 MG capsule Take 2 g by mouth 2 (two) times daily.    . metoprolol tartrate (LOPRESSOR) 50 MG tablet TAKE 1 TABLET BY MOUTH TWICE A DAY. PLEASE KEEP UPCOMING APPOINTMENT FOR FUTURE REFILLS.  Marland Kitchen nitroGLYCERIN (NITROSTAT) 0.4 MG SL tablet Place 1 tablet (0.4 mg total) under the tongue every 5 (five) minutes as needed for chest pain.  Marland Kitchen Potassium Gluconate 2 MEQ TABS Take 1 tablet by mouth daily.  . [DISCONTINUED] clopidogrel (PLAVIX) 75 MG tablet TAKE 1 TABLET (75 MG TOTAL) BY MOUTH DAILY. PLEASE KEEP UPCOMING APPT IN FEBRUARY FOR FUTURE REFILLS     Allergies:   Lidocaine   Social History   Tobacco Use  . Smoking status: Never Smoker  . Smokeless tobacco: Never Used  Substance Use Topics  . Alcohol use: No    Alcohol/week: 0.0 standard drinks  . Drug use: No     Family Hx: The patient's family history includes Coronary artery disease in his mother; Diabetes in his sister; Heart disease in his mother; Hypertension in his brother; Prostate cancer in his father; Transient ischemic attack (age of onset: 80) in his mother.  ROS:   Please see the history of present illness.    Review of Systems  Hematologic/Lymphatic: Bruises/bleeds easily.  Gastrointestinal: Positive for constipation.   All other systems reviewed and are negative.   EKGs/Labs/Other Test Reviewed:    EKG:  EKG is  ordered today.  The ekg ordered today demonstrates AFib, HR  88, normal axis  Recent Labs: 09/14/2018: ALT 19; BUN 23; Creatinine, Ser 1.58; Hemoglobin 14.3; Platelets 145.0; Potassium 4.7; Sodium 140; TSH 4.21   Recent Lipid Panel Lab Results  Component Value Date/Time   CHOL 134 09/14/2018 11:30 AM   CHOL 122 09/01/2017 08:04 AM   CHOL 122 (L) 02/07/2016 07:45 AM   TRIG 122.0 09/14/2018 11:30 AM   TRIG 113 02/07/2016 07:45 AM   TRIG 220 (HH) 09/07/2006 11:05 AM   HDL 42.10 09/14/2018 11:30 AM   HDL 41 09/01/2017 08:04 AM   HDL 38 (L) 02/07/2016 07:45 AM   CHOLHDL 3 09/14/2018 11:30 AM   LDLCALC 67  09/14/2018 11:30 AM   LDLCALC 52 09/01/2017 08:04 AM   LDLCALC 61 02/07/2016 07:45 AM   LDLDIRECT 68.8 07/20/2012 08:37 AM   From [employer]     Physical Exam:    VS:  BP 130/70   Pulse 88   Ht 6\' 4"  (1.93 m)   Wt 221 lb 12.8 oz (100.6 kg)   SpO2 98%   BMI 27.00 kg/m     Wt Readings from Last 3 Encounters:  12/03/18 221 lb 12.8 oz (100.6 kg)  11/16/18 223 lb 1.3 oz (101.2 kg)  09/14/18 222 lb (100.7 kg)     Physical Exam  Constitutional: He is oriented to person, place, and time. He appears well-developed and well-nourished. No distress.  HENT:  Head: Normocephalic and atraumatic.  Eyes: No scleral icterus.  Neck: Neck supple. No JVD present. No thyromegaly present.  Cardiovascular: Normal rate, S1 normal and S2 normal. An irregularly irregular rhythm present.  No murmur heard. Pulmonary/Chest: Breath sounds normal. He has no rales.  Abdominal: Soft. There is no hepatomegaly.  Musculoskeletal:        General: No edema.  Lymphadenopathy:    He has no cervical adenopathy.  Neurological: He is alert and oriented to person, place, and time.  Skin: Skin is warm and dry.  Psychiatric: He has a normal mood and affect.    ASSESSMENT & PLAN:    Atrial Fibrillation He is in atrial fibrillation with controlled rate.  This is new for him.  CHADS2-VASc=5 (hypertension, prior TIA, CAD, age x 1).  He would benefit from long term  anticoagulation to prevent stroke.  We discussed the risks and benefits of starting anticoagulation therapy.  We reviewed the physiology of atrial fibrillation and basic rationale for anticoagulation, cardioversion and antiarrhythmic drugs.   I have recommended starting Apixaban 5 mg twice daily.  His fatigue may be related to his atrial fibrillation.  Therefore, I will bring him back in a month to consider cardioversion.  I will see him going forward with Dr. Acie Fredrickson.  -DC Plavix  -Start Apixaban 5 mg twice daily (weight greater than 60 kg, age less than 31)  -Obtain echocardiogram  -Obtain home sleep study  -Labs: BMET, CBC, TSH  -Follow-up 4 weeks  Coronary artery disease involving native coronary artery of native heart without angina pectoris History of prior myocardial infarction in 2011 treated with drug-eluting stent to the mid RCA and non-STEMI in 2015 treated with a DES to the proximal LAD and DES to the RCA in 2016.  He is doing well without anginal symptoms.  As I am putting him on Apixaban, I will stop his Plavix.  He will remain on aspirin, amlodipine, atorvastatin, metoprolol.  Essential hypertension The patient's blood pressure is controlled on his current regimen.  Continue current therapy.   Hyperlipidemia LDL goal <70 LDL optimal on most recent lab work.  Continue current Rx.    Chronic kidney disease (CKD), stage III (moderate) ([place]) Obtain follow-up BMET today.  Snoring As he has atrial fibrillation, I will arrange a home sleep study to assess for sleep apnea.   Dispo:  Return in about 4 weeks (around 12/31/2018) for Close Follow Up, w/ [name], PA-C.   Total time spent with patient today 40 minutes. This includes reviewing records, evaluating the patient and coordinating care. Face-to-face time >50%.    Medication Adjustments/Labs and Tests Ordered: Current medicines are reviewed at length with the patient today.  Concerns regarding medicines are outlined above.  Tests Ordered: Orders Placed This Encounter  Procedures  . Basic metabolic panel  . CBC  . TSH  . EKG 12-Lead  . ECHOCARDIOGRAM COMPLETE  . Home sleep test   Medication Changes: Meds ordered this encounter  Medications  . apixaban (ELIQUIS) 5 MG TABS tablet    Sig: Take 1 tablet (5 mg total) by mouth 2 (two) times daily.    Dispense:  60 tablet    Refill:  [address], [name], [place]  12/03/2018 11:10 AM    [facility] [place], [place], [place]  [address] Phone: [phone]; Fax: [phone]

## 2018-12-03 ENCOUNTER — Telehealth: Payer: Self-pay | Admitting: *Deleted

## 2018-12-03 ENCOUNTER — Encounter: Payer: Self-pay | Admitting: Physician Assistant

## 2018-12-03 ENCOUNTER — Ambulatory Visit: Payer: Medicare HMO | Admitting: Physician Assistant

## 2018-12-03 VITALS — BP 130/70 | HR 88 | Ht 76.0 in | Wt 221.8 lb

## 2018-12-03 DIAGNOSIS — R0683 Snoring: Secondary | ICD-10-CM

## 2018-12-03 DIAGNOSIS — I2510 Atherosclerotic heart disease of native coronary artery without angina pectoris: Secondary | ICD-10-CM | POA: Diagnosis not present

## 2018-12-03 DIAGNOSIS — I10 Essential (primary) hypertension: Secondary | ICD-10-CM

## 2018-12-03 DIAGNOSIS — E785 Hyperlipidemia, unspecified: Secondary | ICD-10-CM

## 2018-12-03 DIAGNOSIS — N183 Chronic kidney disease, stage 3 unspecified: Secondary | ICD-10-CM

## 2018-12-03 DIAGNOSIS — I4819 Other persistent atrial fibrillation: Secondary | ICD-10-CM

## 2018-12-03 HISTORY — DX: Other persistent atrial fibrillation: I48.19

## 2018-12-03 MED ORDER — APIXABAN 5 MG PO TABS: 5.0000 mg | ORAL_TABLET | Freq: Two times a day (BID) | ORAL | 11 refills | Status: DC

## 2018-12-03 NOTE — Telephone Encounter (Signed)
Staff message sent to Nina ok to schedule HST. No PA required. 

## 2018-12-03 NOTE — Telephone Encounter (Signed)
-----   Message from Jacinta Shoe, [place] sent at 12/03/2018  9:31 AM EST ----- Regarding: Home Sleep Study Hello,  Per [name], PA-C, patient needs to be schedule for a home sleep study. Dx: Afib and snoring.  Thank you

## 2018-12-03 NOTE — Patient Instructions (Addendum)
Medication Instructions:  Your physician has recommended you make the following change in your medication:  1. START ELIQUIS 5 MG TWICE DAILY.  2. STOP PLAVIX  If you need a refill on your cardiac medications before your next appointment, please call your pharmacy.   Lab work: TODAY: BMET, CBC, TSH  If you have labs (blood work) drawn today and your tests are completely normal, you will receive your results only by: Marland Kitchen MyChart Message (if you have MyChart) OR . A paper copy in the mail If you have any lab test that is abnormal or we need to change your treatment, we will call you to review the results.  Testing/Procedures: Your physician has requested that you have an echocardiogram. Echocardiography is a painless test that uses sound waves to create images of your heart. It provides your doctor with information about the size and shape of your heart and how well your heart's chambers and valves are working. This procedure takes approximately one hour. There are no restrictions for this procedure.  Your physician has recommended that you have a HOME sleep study. This test records several body functions during sleep, including: brain activity, eye movement, oxygen and carbon dioxide blood levels, heart rate and rhythm, breathing rate and rhythm, the flow of air through your mouth and nose, snoring, body muscle movements, and chest and belly movement.   Follow-Up: Your physician recommends that you schedule a follow-up appointment in: [place], PA-C   Any Other Special Instructions Will Be Listed Below (If Applicable).

## 2018-12-04 LAB — BASIC METABOLIC PANEL
BUN/Creatinine Ratio: 9 — ABNORMAL LOW (ref 10–24)
BUN: 16 mg/dL (ref 8–27)
CO2: 24 mmol/L (ref 20–29)
Calcium: 10.1 mg/dL (ref 8.6–10.2)
Chloride: 100 mmol/L (ref 96–106)
Creatinine, Ser: 1.81 mg/dL — ABNORMAL HIGH (ref 0.76–1.27)
GFR calc Af Amer: 42 mL/min/{1.73_m2} — ABNORMAL LOW (ref >59)
GFR calc non Af Amer: 36 mL/min/{1.73_m2} — ABNORMAL LOW (ref >59)
Glucose: 99 mg/dL (ref 65–99)
Potassium: 5.1 mmol/L (ref 3.5–5.2)
SODIUM: 139 mmol/L (ref 134–144)

## 2018-12-04 LAB — CBC
Hematocrit: 46.3 % (ref 37.5–51.0)
Hemoglobin: 15 g/dL (ref 13.0–17.7)
MCH: 27.5 pg (ref 26.6–33.0)
MCHC: 32.4 g/dL (ref 31.5–35.7)
MCV: 85 fL (ref 79–97)
PLATELETS: 171 10*3/uL (ref 150–450)
RBC: 5.45 x10E6/uL (ref 4.14–5.80)
RDW: 13.8 % (ref 11.6–15.4)
WBC: 6.7 10*3/uL (ref 3.4–10.8)

## 2018-12-04 LAB — TSH: TSH: 6.24 u[IU]/mL — ABNORMAL HIGH (ref 0.450–4.500)

## 2018-12-07 ENCOUNTER — Telehealth: Payer: Self-pay | Admitting: *Deleted

## 2018-12-07 DIAGNOSIS — I4819 Other persistent atrial fibrillation: Secondary | ICD-10-CM

## 2018-12-07 NOTE — Telephone Encounter (Signed)
Patient is aware and agreeable to Home Sleep Study through [facility]. Patient is scheduled for 12/14/18 at 11 am to pick up home sleep kit and meet with Respiratory therapist at [facility]. Patient is aware that if this appointment date and time does not work for them they should contact Artis Delay directly at [phone]. Patient is aware that a sleep packet will be sent from [facility] in week. Patient is agreeable to treatment and thankful for call.

## 2018-12-07 NOTE — Telephone Encounter (Signed)
-----   Message from Frederik Schmidt, RN sent at 12/06/2018  8:14 AM EST -----  ----- Message ----- From: Liliane Shi, PA-C Sent: 12/05/2018   9:14 PM EST To: Cv [facility] Triage  Creatinine increased some since 09/2018.  K+ and Hgb normal.  TSH high.   Recommendations:  - FU with PCP for thyroid.  Send copy of labs to PCP.  - repeat BMET in 1 month to follow up on kidney function.   [name], PA-C    12/05/2018 9:10 PM

## 2018-12-07 NOTE — Telephone Encounter (Signed)
SPOKE WITH PT ABOUT RESULTS AND VERBALIZED UNDERSTANDING.  PT REPEAT ONE MONTH BMET WILL BE CLOSER TO FOLLOW UP APPT DATE SO PT AGREED TO DO ON SAME DAY AS FOLLOW UP 3-31

## 2018-12-07 NOTE — Telephone Encounter (Signed)
  Lauralee Evener, CMA  Jacinta Shoe, [place]; Freada Bergeron, CMA        Ok to schedule No Auth needed for HST.

## 2018-12-08 ENCOUNTER — Encounter: Payer: Self-pay | Admitting: Physician Assistant

## 2018-12-08 ENCOUNTER — Ambulatory Visit (HOSPITAL_COMMUNITY): Payer: Medicare HMO | Attending: Cardiology

## 2018-12-08 DIAGNOSIS — I4819 Other persistent atrial fibrillation: Secondary | ICD-10-CM | POA: Diagnosis not present

## 2018-12-14 ENCOUNTER — Ambulatory Visit (HOSPITAL_BASED_OUTPATIENT_CLINIC_OR_DEPARTMENT_OTHER): Payer: Medicare HMO | Attending: Physician Assistant | Admitting: Cardiology

## 2018-12-14 DIAGNOSIS — G4733 Obstructive sleep apnea (adult) (pediatric): Secondary | ICD-10-CM | POA: Diagnosis not present

## 2018-12-14 DIAGNOSIS — R0683 Snoring: Secondary | ICD-10-CM

## 2018-12-14 DIAGNOSIS — I4819 Other persistent atrial fibrillation: Secondary | ICD-10-CM | POA: Insufficient documentation

## 2018-12-21 ENCOUNTER — Telehealth: Payer: Self-pay | Admitting: Internal Medicine

## 2018-12-21 DIAGNOSIS — R69 Illness, unspecified: Secondary | ICD-10-CM | POA: Diagnosis not present

## 2018-12-21 DIAGNOSIS — R7989 Other specified abnormal findings of blood chemistry: Secondary | ICD-10-CM

## 2018-12-21 NOTE — Telephone Encounter (Signed)
Please advise.  Results in chart.

## 2018-12-21 NOTE — Telephone Encounter (Signed)
Copied from [place] [id number]. Topic: Quick Communication - See Telephone Encounter >> Dec 21, 2018 11:32 AM Reyne Dumas L wrote: CRM for notification. See Telephone encounter for: 12/21/18.  Pt's wife, [name], calling:  states pt was seen by heart doctor, Dr. Kathlen Mody, and they found high thyroid levels on a blood test.  States they were going to fax information over to Dr. Quay Burow and that pt should wait to hear form PCP, but they haven't heard anything yet.  They would like to know if information was received and if there are any care instructions from PCP. [name] can be reached at [phone]

## 2018-12-21 NOTE — Telephone Encounter (Signed)
Pt is aware of response and expressed understanding.

## 2018-12-21 NOTE — Telephone Encounter (Signed)
I would like him to have repeat blood work to recheck this with more specific testing.  I have ordered the blood work and he can go directly to the lab at any time.  No fasting is required.

## 2018-12-22 ENCOUNTER — Other Ambulatory Visit (INDEPENDENT_AMBULATORY_CARE_PROVIDER_SITE_OTHER): Payer: Medicare HMO

## 2018-12-22 DIAGNOSIS — R7989 Other specified abnormal findings of blood chemistry: Secondary | ICD-10-CM | POA: Diagnosis not present

## 2018-12-22 LAB — T4, FREE: Free T4: 0.83 ng/dL (ref 0.60–1.60)

## 2018-12-22 LAB — TSH: TSH: 4.9 u[IU]/mL — ABNORMAL HIGH (ref 0.35–4.50)

## 2018-12-23 LAB — THYROID ANTIBODIES
THYROID PEROXIDASE ANTIBODY: 2 [IU]/mL (ref <9)
Thyroglobulin Ab: <1 IU/mL (ref <=1)

## 2018-12-24 ENCOUNTER — Encounter: Payer: Self-pay | Admitting: Physician Assistant

## 2018-12-25 ENCOUNTER — Encounter: Payer: Self-pay | Admitting: Internal Medicine

## 2018-12-26 NOTE — Procedures (Signed)
   Patient Name: [name], [name] Date: 12/14/2018 Gender: Male D.O.B: Feb 05, 1946 Age (years): 72 Referring Provider: [name] Height (inches): 60 Interpreting Physician: Fransico Him MD, ABSM Weight (lbs): 221 RPSGT: Jacolyn Reedy BMI: 27 MRN: [id number] Neck Size: 17.50 CLINICAL INFORMATION Sleep Study Type: HST  Indication for sleep study: Snoring  Epworth Sleepiness Score: 7  SLEEP STUDY TECHNIQUE A multi-channel overnight portable sleep study was performed. The channels recorded were: nasal airflow, thoracic respiratory movement, and oxygen saturation with a pulse oximetry. Snoring was also monitored.  MEDICATIONS Patient self administered medications include: N/A.  SLEEP ARCHITECTURE Patient was studied for 467.5 minutes. The sleep efficiency was 100.0 % and the patient was supine for 42.6%. The arousal index was 0.0 per hour.  RESPIRATORY PARAMETERS The overall AHI was 8.6 per hour, with a central apnea index of 0.0 per hour.  The oxygen nadir was 90% during sleep.  CARDIAC DATA Mean heart rate during sleep was 69.0 bpm.  IMPRESSIONS Mild obstructive sleep apnea occurred during this study (AHI = 8.6/h). No significant central sleep apnea occurred during this study (CAI = 0.0/h). The patient had minimal or no oxygen desaturation during the study (Min O2 = 90%) Patient snored 29.8% during the sleep.  DIAGNOSIS Obstructive Sleep Apnea (327.23 [G47.33 ICD-10])  RECOMMENDATIONS Therapeutic CPAP titration to determine optimal pressure required to alleviate sleep disordered breathing. Positional therapy avoiding supine position during sleep. Surgical consultation for Uvulopalatopharyngoplasty (UPPP) may be considered. Oral appliance may be considered. Avoid alcohol, sedatives and other CNS depressants that may worsen sleep apnea and disrupt normal sleep architecture. Sleep hygiene should be reviewed to assess factors that may improve sleep  quality. Weight management and regular exercise should be initiated or continued. Return to Sleep Center to discuss the results of this study Patient may benefit from in-lab study  [Electronically signed] 12/26/2018 08:13 PM  Fransico Him MD, ABSM Diplomate, American Board of Sleep Medicine

## 2018-12-27 ENCOUNTER — Telehealth: Payer: Self-pay | Admitting: *Deleted

## 2018-12-27 NOTE — Telephone Encounter (Signed)
-----   Message from Sueanne Margarita, MD sent at 12/26/2018  8:32 PM EDT ----- Patient has mild OSA - I will call patient to discuss results and plan going forward since Sleep lab is closed to any PAP titrations at this time due to COVID 19

## 2019-01-02 ENCOUNTER — Telehealth: Payer: Self-pay | Admitting: Physician Assistant

## 2019-01-02 NOTE — Telephone Encounter (Signed)
   Cardiac Questionnaire:    Since your last visit or hospitalization:    1. Have you been having new or worsening chest pain? no   2. Have you been having new or worsening shortness of breath? no 3. Have you been having new or worsening leg swelling, wt gain, or increase in abdominal girth (pants fitting more tightly)? no   4. Have you had any passing out spells? no        Patient does not have access to smartphone or computer.  PLAN:  1. Please call patient on 3.30.2020 to go over instructions for telephone visit and obtain consent. [name], PA-C    01/02/2019 2:51 PM

## 2019-01-03 ENCOUNTER — Telehealth: Payer: Self-pay | Admitting: *Deleted

## 2019-01-03 NOTE — Progress Notes (Signed)
Virtual Visit via Telephone Note    Evaluation Performed:  Follow-up visit  This visit type was conducted due to national recommendations for restrictions regarding the COVID-19 Pandemic (e.g. social distancing).  This format is felt to be most appropriate for this patient at this time.  All issues noted in this document were discussed and addressed.  No physical exam was performed (except for noted visual exam findings with Video Visits).  Please refer to the patient's chart (MyChart message for video visits and phone note for telephone visits) for the patient's consent to telehealth for [facility].  Date:  01/04/2019   ID:  [name], DOB May 15, 1946, MRN [id number]  Patient Location:  Home  Provider location:   Home  PCP:  Binnie Rail, MD  Cardiologist:  Mertie Moores, MD / [name], PA-C   Electrophysiologist:  None   Chief Complaint: Follow-up on atrial fibrillation; shortness of breath  History of Present Illness:    [name] is a 73 y.o. male who presents via audio/video conferencing for a telehealth visit today.    He has CAD s/p NSTEMI in 02/2010 tx with DES to the mid RCA and NSTEMI in 2015 tx with DES to the proximal LAD (c/b post cath TIA).  His last LHC was in 4/16 in the setting of [place] and demonstrated severe mid RCA stenosis that was treated with a DES.  Other problems include hypertension, hyperlipidemia, renal insufficiency, prostate CA.  He is a prior patient of Dr. Aundra Dubin and then, Dr. Saunders Revel.  I saw him last in 11/2018.  He was in new onset AFib.  I started him on Eliquis..  An echocardiogram demonstrated normal LVF.  A sleep study was positive for OSA.    Today, he notes shortness of breath with activities.  He also notes being sleepy and having a lack of energy.  He has not had chest pain.  He questions if his shortness of breath is getting worse.  He has had a dry, nonproductive cough for over a month now.  He denies any fevers.  He has not had  orthopnea, paroxysmal nocturnal dyspnea, lower extremity swelling or increased abdominal girth.  He sometimes gets dizzy when he changes head positions or stands quickly.  He denies syncope.  The patient does not symptoms concerning for COVID-19 infection (fever, chills, cough, or new shortness of breath).    Prior CV studies:   The following studies were reviewed today:  Echo 12/08/2018 EF 55-60, mild LAE, trivial MR, mild TR  Cardiac Catheterization 01/19/15 Left mainstem:Normal Left anterior descending (LAD):The stent in the proximal LAD is widely patent. There is a 50% stenosis in the mid vessel.  Left circumflex (LCx):There is a stent in the proximal LCx and it is widely patent. No obstructive disease. Right coronary artery (RCA):The RCA is a large dominant vessel. It is tortuous. There is diffuse 30% disease in the proximal vessel. There is a focal 90% stenosis in the mid vessel.  Left ventriculography: Not done due to CKD.  PCI:  3.5 x 20 mm Synergy DES to mid RCA  Echo 06/04/14 EF 55-60, no RWMA, Gr 1 DD, mild AoV calcification, mild MR, mild LAE  Past Medical History:  Diagnosis Date  . Anemia, mild   . CAD (coronary artery disease)    a. 02/2010 NSTEMI/PCI: PROMUS DES to mRCA. b. 06/05/14 Cath/PCI: pLAD 70%--> s/p PCI/DES (Promus DES);  c. 01/2015 Cath/PCI: LM nl, LAD 19m, patent prox stent, LCX patent  prox stent, RCA 30p, 56m (3.5x20 Synergy DES).  . CKD (chronic kidney disease), stage III ([place])   . Diastolic dysfunction    a. Echo 6/11: mild LVH, EF 55-60%, GR1DD, Trivial MR, mild LAE. b. echo 06/04/14: EF 55-60%, no WMA, GR1DD, Ao valve mildly calcifed, mild MR, mild LAE  . History of pulmonary embolism   . Hyperlipidemia   . Hypertension    did not tolerate Lisinopril event at low dose  . Melanoma ([place])    a. 1987. b. 2012  . Myocardial infarction ([place])   . Persistent atrial fibrillation 12/03/2018   Dx 11/2018 >> Apixaban started // Echo 12/2018: EF 55-60, mod RVE,  normal RVSF, mild LAE, trivial MR, mild TR, mod AoV sclerosis   . Prostate cancer ([place])    Dr Karsten Ro  . Stroke ([place])   . TIA (transient ischemic attack)    a. post cath and PCI on 06/05/2014 - no residual sequelae.  . Vitamin B12 deficiency   1. Hypertension: Did not tolerate lisinopril even at low dose.  2. Hyperlipidemia 3. Mild anemia, B12 deficiency 4. Prostate cancer 5. Skin cancer 6. S/P TKR 7. CAD: NSTEMI 5/11. LHC showed 60% prox LAD, 70% mid LAD, 95% mid LCx with TIMI 2 flow distally, EF 60%. Patient had PROMUS DES to mLCx. NSTEMI (8/15), LHC showed 70% prox/50% mLAD, 75% ostial D1, 50-60% mRCA. LAD stenosis significant by FFR, RCA stenosis was not. Patient had DES to LAD. Echo (8/15) with EF 55-60%, mild MR.  - Unstable angina 4/16. LHC showed 50% mLAD, 90% mRCA. He had DES to [facility].  8. CKD 9. TIA post-PCI in 8/15.   Past Surgical History:  Procedure Laterality Date  . CHOLECYSTECTOMY    . COLONOSCOPY  2006  . KNEE ARTHROSCOPY     right  . LEFT HEART CATHETERIZATION WITH CORONARY ANGIOGRAM N/A 06/05/2014   Procedure: LEFT HEART CATHETERIZATION WITH CORONARY ANGIOGRAM;  Surgeon: Blane Ohara, MD;  Location[facility] CATH LAB;  Service: Cardiovascular;  Laterality: N/A;  . LEFT HEART CATHETERIZATION WITH CORONARY ANGIOGRAM N/A 01/19/2015   Procedure: LEFT HEART CATHETERIZATION WITH CORONARY ANGIOGRAM;  Surgeon: Peter M Martinique, MD;  Location: [facility] CATH LAB;  Service: Cardiovascular;  Laterality: N/A;  . MELANOMA EXCISION  2011    L chest  . MELANOMA EXCISION  1991   back  . mRCA stent  02/2010  . PROSTATECTOMY     Dr. Karsten Ro  . TONSILLECTOMY       Current Meds  Medication Sig  . amLODipine (NORVASC) 5 MG tablet TAKE 1 TABLET (5 MG TOTAL) BY MOUTH DAILY. PLEASE KEEP UPCOMING APPT IN FEBRUARY FOR FUTURE REFILLS.  Marland Kitchen apixaban (ELIQUIS) 5 MG TABS tablet Take 1 tablet (5 mg total) by mouth 2 (two) times daily.  Marland Kitchen aspirin 81 MG tablet Take 81 mg by mouth 2 (two) times  daily.   Marland Kitchen atorvastatin (LIPITOR) 20 MG tablet TAKE 1 TABLET BY MOUTH EVERY DAY. PLEASE KEEP UPCOMING APPOINTMENT FOR FUTURE REFILLS.  . calcium carbonate (TUMS - DOSED IN MG ELEMENTAL CALCIUM) 500 MG chewable tablet Chew 2 tablets by mouth daily as needed for indigestion or heartburn.   . Cyanocobalamin (VITAMIN B12 PO) Take 1 tablet by mouth every evening.  . fish oil-omega-3 fatty acids 1000 MG capsule Take 2 g by mouth 2 (two) times daily.    . metoprolol tartrate (LOPRESSOR) 50 MG tablet TAKE 1 TABLET BY MOUTH TWICE A DAY. PLEASE KEEP UPCOMING APPOINTMENT FOR FUTURE REFILLS.  Marland Kitchen  nitroGLYCERIN (NITROSTAT) 0.4 MG SL tablet Place 1 tablet (0.4 mg total) under the tongue every 5 (five) minutes as needed for chest pain.  Marland Kitchen Potassium Gluconate 2 MEQ TABS Take 1 tablet by mouth daily.     Allergies:   Lidocaine   Social History   Tobacco Use  . Smoking status: Never Smoker  . Smokeless tobacco: Never Used  Substance Use Topics  . Alcohol use: No    Alcohol/week: 0.0 standard drinks  . Drug use: No     Family Hx: The patient's family history includes Coronary artery disease in his mother; Diabetes in his sister; Heart disease in his mother; Hypertension in his brother; Prostate cancer in his father; Transient ischemic attack (age of onset: 45) in his mother.  ROS:   Please see the history of present illness.    He denies melena, hematochezia, hematuria. All other systems reviewed and are negative.   Labs/Other Tests and Data Reviewed:    Recent Labs: 09/14/2018: ALT 19 12/03/2018: BUN 16; Creatinine, Ser 1.81; Hemoglobin 15.0; Platelets 171; Potassium 5.1; Sodium 139 12/22/2018: TSH 4.90   Recent Lipid Panel Lab Results  Component Value Date/Time   CHOL 134 09/14/2018 11:30 AM   CHOL 122 09/01/2017 08:04 AM   CHOL 122 (L) 02/07/2016 07:45 AM   TRIG 122.0 09/14/2018 11:30 AM   TRIG 113 02/07/2016 07:45 AM   TRIG 220 (HH) 09/07/2006 11:05 AM   HDL 42.10 09/14/2018 11:30 AM    HDL 41 09/01/2017 08:04 AM   HDL 38 (L) 02/07/2016 07:45 AM   CHOLHDL 3 09/14/2018 11:30 AM   LDLCALC 67 09/14/2018 11:30 AM   LDLCALC 52 09/01/2017 08:04 AM   LDLCALC 61 02/07/2016 07:45 AM   LDLDIRECT 68.8 07/20/2012 08:37 AM   From [employer]    Wt Readings from Last 3 Encounters:  01/04/19 221 lb (100.2 kg)  12/14/18 221 lb (100.2 kg)  12/03/18 221 lb 12.8 oz (100.6 kg)     Objective:    Vital Signs:  BP 123/82   Pulse 60   Ht 6\' 4"  (1.93 m)   Wt 221 lb (100.2 kg)   BMI 26.90 kg/m     ASSESSMENT & PLAN:    Shortness of breath Etiology not entirely clear.  Limitations of a telephone visit include lack of physical exam.  However, his weight seems stable and he does not report any lower extremity swelling.  His blood pressure is stable.  His pulse checked by his account is stable.  Etiologies for his symptoms include symptomatic atrial fibrillation, uncontrolled heart rate or bradycardia, anemia, congestive heart failure.  -Arrange labs: BMET, CBC, BNP  -Arrange 24-hour Holter to assess heart rate control  -Arrange follow-up telephone visit in 1 month  -If needed, arrange visit in the office with attending MD  Persistent atrial fibrillation He has been having trouble affording Eliquis.  I will try to see if we can get him some assistance.  There has also been a fund opened up to help patients afford Eliquis.  I will check with the social worker at our congestive heart failure clinic for more information on this as well.  As noted, we will try to arrange a BMET, CBC.  Coronary artery disease involving native coronary artery of native heart without angina pectoris History of prior myocardial infarction in 2011 treated with drug-eluting stent to the mid RCA and non-STEMI in 2015 treated with a DES to the proximal LAD and DES to the RCA in 2016.  He is not having any anginal symptoms.  Due to his multiple PCI procedures in the past, he remains on aspirin in addition to Eliquis.   Continue beta-blocker, statin.  Essential hypertension The patient's blood pressure is controlled on his current regimen.  Continue current therapy.   Chronic kidney disease (CKD), stage III (moderate) (HCC)  Stable by most recent basic metabolic panel.  As noted, repeat BMET will be obtained.   COVID-19 Education: The signs and symptoms of COVID-19 were discussed with the patient and how to seek care for testing (follow up with PCP or arrange E-visit).  The importance of social distancing was discussed today.  Patient Risk:   After full review of this patient's clinical status, I feel that they are at least moderate risk at this time.  Time:   Today, I have spent 23 minutes with the patient with telehealth technology discussing the above problems.     Medication Adjustments/Labs and Tests Ordered: Current medicines are reviewed at length with the patient today.  Concerns regarding medicines are outlined above.  Tests Ordered: No orders of the defined types were placed in this encounter.  Medication Changes: No orders of the defined types were placed in this encounter.   Disposition:  Follow up in 1 month(s)  Signed, [name], PA-C  01/04/2019 9:56 AM    [facility]

## 2019-01-03 NOTE — Telephone Encounter (Signed)
     TELEPHONE CALL NOTE  [name] has been deemed a candidate for a follow-up tele-health visit to limit community exposure during the Covid-19 pandemic. I spoke with the patient via phone to ensure availability of phone/video source, confirm preferred email & phone number, and discuss instructions and expectations.  I reminded [name] to be prepared with any vital sign and/or heart rhythm information that could potentially be obtained via home monitoring, at the time of his visit. I reminded [name] to expect a phone call at the time of his visit if his visit.  Did the patient verbally acknowledge consent to treatment? Darlyn Chamber, CMA 01/03/2019 4:20 PM

## 2019-01-04 ENCOUNTER — Telehealth (HOSPITAL_COMMUNITY): Payer: Self-pay | Admitting: Licensed Clinical Social Worker

## 2019-01-04 ENCOUNTER — Other Ambulatory Visit: Payer: Medicare HMO

## 2019-01-04 ENCOUNTER — Telehealth: Payer: Self-pay | Admitting: *Deleted

## 2019-01-04 ENCOUNTER — Telehealth: Payer: Self-pay

## 2019-01-04 ENCOUNTER — Other Ambulatory Visit: Payer: Self-pay

## 2019-01-04 ENCOUNTER — Telehealth (INDEPENDENT_AMBULATORY_CARE_PROVIDER_SITE_OTHER): Payer: Medicare HMO | Admitting: Physician Assistant

## 2019-01-04 ENCOUNTER — Encounter: Payer: Self-pay | Admitting: Physician Assistant

## 2019-01-04 VITALS — BP 123/82 | HR 60 | Ht 76.0 in | Wt 221.0 lb

## 2019-01-04 DIAGNOSIS — I2510 Atherosclerotic heart disease of native coronary artery without angina pectoris: Secondary | ICD-10-CM

## 2019-01-04 DIAGNOSIS — N183 Chronic kidney disease, stage 3 unspecified: Secondary | ICD-10-CM

## 2019-01-04 DIAGNOSIS — Z955 Presence of coronary angioplasty implant and graft: Secondary | ICD-10-CM | POA: Diagnosis not present

## 2019-01-04 DIAGNOSIS — I129 Hypertensive chronic kidney disease with stage 1 through stage 4 chronic kidney disease, or unspecified chronic kidney disease: Secondary | ICD-10-CM | POA: Diagnosis not present

## 2019-01-04 DIAGNOSIS — Z7982 Long term (current) use of aspirin: Secondary | ICD-10-CM

## 2019-01-04 DIAGNOSIS — R0602 Shortness of breath: Secondary | ICD-10-CM | POA: Diagnosis not present

## 2019-01-04 DIAGNOSIS — I252 Old myocardial infarction: Secondary | ICD-10-CM

## 2019-01-04 DIAGNOSIS — I4819 Other persistent atrial fibrillation: Secondary | ICD-10-CM

## 2019-01-04 DIAGNOSIS — Z7901 Long term (current) use of anticoagulants: Secondary | ICD-10-CM | POA: Diagnosis not present

## 2019-01-04 DIAGNOSIS — I10 Essential (primary) hypertension: Secondary | ICD-10-CM

## 2019-01-04 DIAGNOSIS — Z91120 Patient's intentional underdosing of medication regimen due to financial hardship: Secondary | ICD-10-CM

## 2019-01-04 DIAGNOSIS — Z79899 Other long term (current) drug therapy: Secondary | ICD-10-CM

## 2019-01-04 DIAGNOSIS — I214 Non-ST elevation (NSTEMI) myocardial infarction: Secondary | ICD-10-CM

## 2019-01-04 NOTE — Telephone Encounter (Addendum)
SPOKE WITH PT LABS WILL DONE ON  01-05-19 FOLLOW UP TELEHEALTH VISIT 02-04-19  Medication Changes: Current medicines are reviewed at length with the patient today.  Concerns regarding medicines are outlined above.  Labs:  bmet bnp and cbc at office or home health nurse   Tests Ordered: 24 hour holter   Follow Up: I month telehealth visit with Scott   Disposition:  Follow up in 1 month(s)  Signed, [name], PA-C  01/04/2019 9:56 AM    [place]

## 2019-01-04 NOTE — Telephone Encounter (Signed)
CSW received referral to assist patient with Eliquis medication. CSW spoke with patient about the Eliquis Patient Assistance Program. Patient states he will be in [facility]. Office tomorrow and can pick up application at that time. CSW informed patient of needed documents to accompany the application. Patient verbalizes understanding and will follow up completion of application.  CSW emailed application to [employer], [place] and informed of process for completion of application. CSW available as needed for further assistance with application process. Raquel Sarna, [place], [place]

## 2019-01-04 NOTE — Telephone Encounter (Signed)
**Note De-identified Aailyah Dunbar Obfuscation** -----  **Note De-Identified Tyneka Scafidi Obfuscation** Message from Liliane Shi, [place] sent at 01/04/2019  9:21 AM EDT ----- Regarding: Eliquis He is having trouble affording Eliquis.  Can you help him? Thanks [employer]

## 2019-01-04 NOTE — Telephone Encounter (Signed)
I s/w the pt and his wife [name]. Per [name] they have not met their deductible yet this year.  I have asked them to contact their insurance company to find out how much more is left to pay on his deductible and how much Eliquis will cost him once the deductible is met. I have asked them to call me back if they find that he cannot afford Elliquis and that I will discuss with [name], PA-c..  They both verbalized understanding.

## 2019-01-05 ENCOUNTER — Encounter: Payer: Self-pay | Admitting: *Deleted

## 2019-01-05 ENCOUNTER — Other Ambulatory Visit: Payer: Self-pay

## 2019-01-05 ENCOUNTER — Telehealth: Payer: Self-pay | Admitting: *Deleted

## 2019-01-05 ENCOUNTER — Other Ambulatory Visit: Payer: Medicare HMO | Admitting: *Deleted

## 2019-01-05 DIAGNOSIS — N183 Chronic kidney disease, stage 3 unspecified: Secondary | ICD-10-CM

## 2019-01-05 DIAGNOSIS — R0602 Shortness of breath: Secondary | ICD-10-CM | POA: Diagnosis not present

## 2019-01-05 DIAGNOSIS — I4819 Other persistent atrial fibrillation: Secondary | ICD-10-CM | POA: Diagnosis not present

## 2019-01-05 LAB — CBC
Hematocrit: 43.1 % (ref 37.5–51.0)
Hemoglobin: 14.5 g/dL (ref 13.0–17.7)
MCH: 28.1 pg (ref 26.6–33.0)
MCHC: 33.6 g/dL (ref 31.5–35.7)
MCV: 84 fL (ref 79–97)
Platelets: 146 10*3/uL — ABNORMAL LOW (ref 150–450)
RBC: 5.16 x10E6/uL (ref 4.14–5.80)
RDW: 13.9 % (ref 11.6–15.4)
WBC: 4.9 10*3/uL (ref 3.4–10.8)

## 2019-01-05 LAB — PRO B NATRIURETIC PEPTIDE: NT-Pro BNP: 689 pg/mL — ABNORMAL HIGH (ref 0–376)

## 2019-01-05 LAB — BASIC METABOLIC PANEL
BUN/Creatinine Ratio: 10 (ref 10–24)
BUN: 17 mg/dL (ref 8–27)
CO2: 22 mmol/L (ref 20–29)
Calcium: 9.3 mg/dL (ref 8.6–10.2)
Chloride: 101 mmol/L (ref 96–106)
Creatinine, Ser: 1.67 mg/dL — ABNORMAL HIGH (ref 0.76–1.27)
GFR calc Af Amer: 46 mL/min/{1.73_m2} — ABNORMAL LOW (ref >59)
GFR calc non Af Amer: 40 mL/min/{1.73_m2} — ABNORMAL LOW (ref >59)
Glucose: 143 mg/dL — ABNORMAL HIGH (ref 65–99)
Potassium: 4.2 mmol/L (ref 3.5–5.2)
Sodium: 140 mmol/L (ref 134–144)

## 2019-01-05 NOTE — Telephone Encounter (Signed)
Trying to contact patient regarding sending him a replacement for a 24 hour holter.

## 2019-01-05 NOTE — Progress Notes (Signed)
Patient ID: [name], male   DOB: [birth year], 73 y.o.   MRN: [id number] Patient enrolled for [employer] to ship a 3 day long term monitor, (holter), directly to his home.

## 2019-01-05 NOTE — Addendum Note (Signed)
Addended by: Claude Manges on: 01/05/2019 03:01 PM   Modules accepted: Orders

## 2019-01-11 ENCOUNTER — Ambulatory Visit (INDEPENDENT_AMBULATORY_CARE_PROVIDER_SITE_OTHER): Payer: Medicare HMO

## 2019-01-11 DIAGNOSIS — I4819 Other persistent atrial fibrillation: Secondary | ICD-10-CM

## 2019-01-12 ENCOUNTER — Telehealth: Payer: Self-pay | Admitting: *Deleted

## 2019-01-12 NOTE — Telephone Encounter (Signed)
-----   Message from Sueanne Margarita, MD sent at 12/28/2018  4:12 PM EDT -----  ----- Message ----- From: Sueanne Margarita, MD Sent: 12/28/2018   4:11 PM EDT To: Sueanne Margarita, MD  Gae Bon,  Please call this patient and let him know results.  Please set him up for OV with me virtually with Ben's help  Traci ----- Message ----- From: Sueanne Margarita, MD Sent: 12/26/2018   8:32 PM EDT To: Sueanne Margarita, MD  Patient has mild OSA - I will call patient to discuss results and plan going forward since Sleep lab is closed to any PAP titrations at this time due to COVID 19

## 2019-01-12 NOTE — Telephone Encounter (Signed)
Informed patient of sleep study results and patient understanding was verbalized. Patient understands his sleep study showed patient has mild OSA - patient understands TT will call patient to discuss results and plan going forward since Sleep lab is closed to any PAP titrations at this time due to COVID 19. Pt is aware and agreeable to these results.

## 2019-01-13 ENCOUNTER — Telehealth: Payer: Self-pay

## 2019-01-13 NOTE — Telephone Encounter (Signed)
-----   Message from Freada Bergeron, [place] sent at 01/12/2019 11:03 AM EDT ----- Regarding: E-VISIT  Please set him up for OV with me virtually with Ben's help  ----- Message ----- From: Sueanne Margarita, MD Sent: 12/28/2018   4:12 PM EDT To: Freada Bergeron, CMA   ----- Message ----- From: Sueanne Margarita, MD Sent: 12/28/2018   4:11 PM EDT To: Sueanne Margarita, MD  Gae Bon,  Please call this patient and let him know results.  Please set him up for OV with me virtually with Ben's help  Traci ----- Message ----- From: Sueanne Margarita, MD Sent: 12/26/2018   8:32 PM EDT To: Sueanne Margarita, MD  Patient has mild OSA - I will call patient to discuss results and plan going forward since Sleep lab is closed to any PAP titrations at this time due to COVID 19

## 2019-01-13 NOTE — Telephone Encounter (Signed)
Spoke with the patient, he stated he would like to wait till an in-person office visit around August time. He declined a Tele-Health Visit. He had no further questions.

## 2019-01-19 NOTE — Telephone Encounter (Signed)
I called the patient to see if he had any luck in getting the price of Eliquis to go down.  His next Rx will be $50.  I do have a patient assistance form I can send to him.  If he has continued issues affording the Eliquis, he will contact me and I will send him the form.  I also asked him to see if a 90 day supply is any cheaper and let me know if he needs his Rx changed. [name], PA-C    01/19/2019 8:56 AM

## 2019-01-20 ENCOUNTER — Other Ambulatory Visit: Payer: Self-pay | Admitting: Physician Assistant

## 2019-01-20 ENCOUNTER — Other Ambulatory Visit: Payer: Self-pay

## 2019-01-20 DIAGNOSIS — I4819 Other persistent atrial fibrillation: Secondary | ICD-10-CM

## 2019-02-03 NOTE — Progress Notes (Signed)
Virtual Visit via Telephone Note   This visit type was conducted due to national recommendations for restrictions regarding the COVID-19 Pandemic (e.g. social distancing) in an effort to limit this patient's exposure and mitigate transmission in our community.  Due to his co-morbid illnesses, this patient is at least at moderate risk for complications without adequate follow up.  This format is felt to be most appropriate for this patient at this time.  The patient did not have access to video technology/had technical difficulties with video requiring transitioning to audio format only (telephone).  All issues noted in this document were discussed and addressed.  No physical exam could be performed with this format.  Please refer to the patient's chart for his  consent to telehealth for [facility].   Date:  02/04/2019   ID:  [name], DOB 11-Sep-1946, MRN [id number]  Patient Location: Home Provider Location: Home  PCP:  Binnie Rail, MD  Cardiologist:  Mertie Moores, MD   Electrophysiologist:  None   Evaluation Performed:  Follow-Up Visit  Chief Complaint:  FU on shortness of breath, AFib, CAD  History of Present Illness:    [name] is a 73 y.o. male with CAD s/p NSTEMI in 02/2010 tx with DES to the mid RCA and NSTEMI in 2015 tx with DES to the proximal LAD (c/b post cath TIA), persistent AFib, sleep apnea, hypertension, hyperlipidemia, renal insufficiency, prostate CA.  LHC was in 4/16 in the setting of [place] and demonstrated severe mid RCA stenosis that was treated with a DES. I saw him last via Telemedicine 01/04/2019.  He complained of shortness of breath.  A BNP was not significantly elevated.  A Zio patch monitor showed AFib with an average HR of 75.    Today, he notes he is doing well.  His breathing is fairly stable.  He does describe some allergy symptoms as well as a dry, nonproductive cough.  His cough has been ongoing for a couple of months now.  He has not had any  fevers.  He has not had chest discomfort, orthopnea or lower extremity swelling.  He has not had syncope.  The patient does not have symptoms concerning for COVID-19 infection (fever, chills, cough, or new shortness of breath).    Past Medical History:  Diagnosis Date  . Anemia, mild   . CAD (coronary artery disease)    a. 02/2010 NSTEMI/PCI: PROMUS DES to mRCA. b. 06/05/14 Cath/PCI: pLAD 70%--> s/p PCI/DES (Promus DES);  c. 01/2015 Cath/PCI: LM nl, LAD 43m, patent prox stent, LCX patent prox stent, RCA 30p, 31m (3.5x20 Synergy DES).  . CKD (chronic kidney disease), stage III ([place])   . Diastolic dysfunction    a. Echo 6/11: mild LVH, EF 55-60%, GR1DD, Trivial MR, mild LAE. b. echo 06/04/14: EF 55-60%, no WMA, GR1DD, Ao valve mildly calcifed, mild MR, mild LAE  . History of pulmonary embolism   . Hyperlipidemia   . Hypertension    did not tolerate Lisinopril event at low dose  . Melanoma ([place])    a. 1987. b. 2012  . Myocardial infarction ([place])   . Persistent atrial fibrillation 12/03/2018   Dx 11/2018 >> Apixaban started // Echo 12/2018: EF 55-60, mod RVE, normal RVSF, mild LAE, trivial MR, mild TR, mod AoV sclerosis   . Prostate cancer ([place])    Dr Karsten Ro  . Stroke ([place])   . TIA (transient ischemic attack)    a. post cath and PCI on 06/05/2014 -  no residual sequelae.  . Vitamin B12 deficiency    Past Surgical History:  Procedure Laterality Date  . CHOLECYSTECTOMY    . COLONOSCOPY  2006  . KNEE ARTHROSCOPY     right  . LEFT HEART CATHETERIZATION WITH CORONARY ANGIOGRAM N/A 06/05/2014   Procedure: LEFT HEART CATHETERIZATION WITH CORONARY ANGIOGRAM;  Surgeon: Blane Ohara, MD;  Location[facility];  Service: Cardiovascular;  Laterality: N/A;  . LEFT HEART CATHETERIZATION WITH CORONARY ANGIOGRAM N/A 01/19/2015   Procedure: LEFT HEART CATHETERIZATION WITH CORONARY ANGIOGRAM;  Surgeon: Peter M Martinique, MD;  Location: [facility] CATH LAB;  Service: Cardiovascular;  Laterality: N/A;  . MELANOMA  EXCISION  2011    L chest  . MELANOMA EXCISION  1991   back  . mRCA stent  02/2010  . PROSTATECTOMY     Dr. Karsten Ro  . TONSILLECTOMY       Current Meds  Medication Sig  . amLODipine (NORVASC) 5 MG tablet TAKE 1 TABLET (5 MG TOTAL) BY MOUTH DAILY. PLEASE KEEP UPCOMING APPT IN FEBRUARY FOR FUTURE REFILLS.  Marland Kitchen apixaban (ELIQUIS) 5 MG TABS tablet Take 1 tablet (5 mg total) by mouth 2 (two) times daily.  Marland Kitchen aspirin 81 MG tablet Take 81 mg by mouth 2 (two) times daily.   . calcium carbonate (TUMS - DOSED IN MG ELEMENTAL CALCIUM) 500 MG chewable tablet Chew 2 tablets by mouth daily as needed for indigestion or heartburn.   . Cyanocobalamin (VITAMIN B12 PO) Take 1 tablet by mouth every evening.  . fish oil-omega-3 fatty acids 1000 MG capsule Take 2 g by mouth 2 (two) times daily.    . metoprolol tartrate (LOPRESSOR) 50 MG tablet TAKE 1 TABLET BY MOUTH TWICE A DAY. PLEASE KEEP UPCOMING APPOINTMENT FOR FUTURE REFILLS.  Marland Kitchen Misc Natural Products (TART CHERRY ADVANCED PO) Take 1,000 mg by mouth daily.  . nitroGLYCERIN (NITROSTAT) 0.4 MG SL tablet Place 1 tablet (0.4 mg total) under the tongue every 5 (five) minutes as needed for chest pain.  Marland Kitchen Potassium Gluconate 2 MEQ TABS Take 1 tablet by mouth daily.  . [DISCONTINUED] atorvastatin (LIPITOR) 20 MG tablet TAKE 1 TABLET BY MOUTH EVERY DAY. PLEASE KEEP UPCOMING APPOINTMENT FOR FUTURE REFILLS.     Allergies:   Lidocaine   Social History   Tobacco Use  . Smoking status: Never Smoker  . Smokeless tobacco: Never Used  Substance Use Topics  . Alcohol use: No    Alcohol/week: 0.0 standard drinks  . Drug use: No     Family Hx: The patient's family history includes Coronary artery disease in his mother; Diabetes in his sister; Heart disease in his mother; Hypertension in his brother; Prostate cancer in his father; Transient ischemic attack (age of onset: 60) in his mother.  ROS:   Please see the history of present illness.    He notes issues with  memory loss.  He would like to stop his Lipitor. All other systems reviewed and are negative.   Prior CV studies:   The following studies were reviewed today:  LT Cardiac Monitor (Zio) 01/20/2019  Atrial fibrillation, Avg HR 75  Rare PVCs or aberrantly conducted beats  No significant pauses or episodes of tachycardia  Echo 12/08/2018 EF 55-60, mild LAE, trivial MR, mild TR  Cardiac Catheterization4/15/16 Left mainstem:Normal Left anterior descending (LAD):The stent in the proximal LAD is widely patent. There is a 50% stenosis in the mid vessel.  Left circumflex (LCx):There is a stent in the proximal LCx and it  is widely patent. No obstructive disease. Right coronary artery (RCA):The RCA is a large dominant vessel. It is tortuous. There is diffuse 30% disease in the proximal vessel. There is a focal 90% stenosis in the mid vessel.  Left ventriculography: Not done due to CKD. PCI: 3.5 x 20 mm Synergy DES to mid RCA  Echo 06/04/14 EF 55-60, no RWMA, Gr 1 DD, mild AoV calcification, mild MR, mild LAE    Labs/Other Tests and Data Reviewed:    EKG:  No ECG reviewed.  Recent Labs: 09/14/2018: ALT 19 12/22/2018: TSH 4.90 01/05/2019: BUN 17; Creatinine, Ser 1.67; Hemoglobin 14.5; NT-Pro BNP 689; Platelets 146; Potassium 4.2; Sodium 140   Recent Lipid Panel Lab Results  Component Value Date/Time   CHOL 134 09/14/2018 11:30 AM   CHOL 122 09/01/2017 08:04 AM   CHOL 122 (L) 02/07/2016 07:45 AM   TRIG 122.0 09/14/2018 11:30 AM   TRIG 113 02/07/2016 07:45 AM   TRIG 220 (HH) 09/07/2006 11:05 AM   HDL 42.10 09/14/2018 11:30 AM   HDL 41 09/01/2017 08:04 AM   HDL 38 (L) 02/07/2016 07:45 AM   CHOLHDL 3 09/14/2018 11:30 AM   LDLCALC 67 09/14/2018 11:30 AM   LDLCALC 52 09/01/2017 08:04 AM   LDLCALC 61 02/07/2016 07:45 AM   LDLDIRECT 68.8 07/20/2012 08:37 AM       Wt Readings from Last 3 Encounters:  02/04/19 215 lb (97.5 kg)  01/04/19 221 lb (100.2 kg)  12/14/18 221 lb  (100.2 kg)     Objective:    Vital Signs:  BP 130/70   Pulse 72   Ht 6\' 4"  (1.93 m)   Wt 215 lb (97.5 kg)   BMI 26.17 kg/m    VITAL SIGNS:  reviewed GEN:  no acute distress RESPIRATORY:  No labored breathing during our conversation NEURO:  Alert and oriented PSYCH:  He seems to be in good spirits  ASSESSMENT & PLAN:    Coronary artery disease involving native coronary artery of native heart with angina pectoris ([place]) History of prior myocardial infarction in 2011 treated with drug-eluting stent to the mid RCA and non-STEMI in 2015 treated with a DES to the proximal LAD and DES to the RCA in 2016.   He is doing well without angina.  He remains on aspirin but is no longer on clopidogrel as he is now on Apixaban.  He has been having some memory issues and would like to stop taking Lipitor.  I have asked him to stop Lipitor for now and to notify me how he is doing in 2 months.  If his memory is better, we could try Crestor versus Zetia.  If it is not improving, he could resume his Lipitor and follow-up with primary care for further testing.  -DC Lipitor  -The patient will contact me in 2 months to update me on his symptoms  Persistent atrial fibrillation Heart rate is well controlled on recent ZIO monitor.  Continue current dose of Apixaban.  Chronic kidney disease (CKD), stage III (moderate) (HCC) Creatinine stable on most recent labs.  Essential hypertension The patient's blood pressure is controlled on his current regimen.  Continue current therapy.   COVID-19 Education: The signs and symptoms of COVID-19 were discussed with the patient and how to seek care for testing (follow up with PCP or arrange E-visit).  The importance of social distancing was discussed today.  Time:   Today, I have spent 11.5 minutes with the patient with telehealth technology discussing the  above problems.     Medication Adjustments/Labs and Tests Ordered: Current medicines are reviewed at length  with the patient today.  Concerns regarding medicines are outlined above.   Tests Ordered: No orders of the defined types were placed in this encounter.   Medication Changes: No orders of the defined types were placed in this encounter.   Disposition:  Follow up in 6 month(s)  Signed, [name], PA-C  02/04/2019 1:54 PM    [facility]

## 2019-02-04 ENCOUNTER — Encounter: Payer: Self-pay | Admitting: Physician Assistant

## 2019-02-04 ENCOUNTER — Telehealth (INDEPENDENT_AMBULATORY_CARE_PROVIDER_SITE_OTHER): Payer: Medicare HMO | Admitting: Physician Assistant

## 2019-02-04 ENCOUNTER — Other Ambulatory Visit: Payer: Self-pay

## 2019-02-04 VITALS — BP 130/70 | HR 72 | Ht 76.0 in | Wt 215.0 lb

## 2019-02-04 DIAGNOSIS — Z7189 Other specified counseling: Secondary | ICD-10-CM

## 2019-02-04 DIAGNOSIS — I4819 Other persistent atrial fibrillation: Secondary | ICD-10-CM

## 2019-02-04 DIAGNOSIS — I25119 Atherosclerotic heart disease of native coronary artery with unspecified angina pectoris: Secondary | ICD-10-CM

## 2019-02-04 DIAGNOSIS — I10 Essential (primary) hypertension: Secondary | ICD-10-CM

## 2019-02-04 DIAGNOSIS — N183 Chronic kidney disease, stage 3 unspecified: Secondary | ICD-10-CM

## 2019-02-04 NOTE — Patient Instructions (Signed)
Medication Instructions:  Stop Lipitor (Atorvastatin) for now  If you need a refill on your cardiac medications before your next appointment, please call your pharmacy.   Lab work: None   If you have labs (blood work) drawn today and your tests are completely normal, you will receive your results only by: Marland Kitchen MyChart Message (if you have MyChart) OR . A paper copy in the mail If you have any lab test that is abnormal or we need to change your treatment, we will call you to review the results.  Testing/Procedures: None   Follow-Up: At [facility], you and your health needs are our priority.  As part of our continuing mission to provide you with exceptional heart care, we have created designated Provider Care Teams.  These Care Teams include your primary Cardiologist (physician) and Advanced Practice Providers (APPs -  Physician Assistants and Nurse Practitioners) who all work together to provide you with the care you need, when you need it. You will need a follow up appointment in:  6 months.  Please call our office 2 months in advance to schedule this appointment.  You may see Mertie Moores, MD or [name], PA-C   Any Other Special Instructions Will Be Listed Below (If Applicable).  Call me in 2 months to let me know how you are doing off of the Lipitor (Atorvastatin).

## 2019-02-20 ENCOUNTER — Other Ambulatory Visit: Payer: Self-pay | Admitting: Internal Medicine

## 2019-02-21 NOTE — Telephone Encounter (Signed)
Please review for refill.  

## 2019-02-23 NOTE — Telephone Encounter (Signed)
Looks like pt is followed by Dr. Saunders Revel in [place] office.

## 2019-02-23 NOTE — Telephone Encounter (Signed)
Refills say pt needs appt. Pt just saw [name], [place] 12/2018. Not sure why this was sent to me. I will send to refills.

## 2019-02-24 DIAGNOSIS — H2513 Age-related nuclear cataract, bilateral: Secondary | ICD-10-CM | POA: Diagnosis not present

## 2019-04-15 ENCOUNTER — Telehealth: Payer: Self-pay | Admitting: *Deleted

## 2019-04-15 NOTE — Telephone Encounter (Signed)
Spoke with patient about memory improvement since holding Lipitor and patient stated its been doing much bette and r glad he's holding it .Patient was told the provider Kathlen Mody will be  told this information.

## 2019-04-17 NOTE — Telephone Encounter (Signed)
DC Lipitor. Start Zetia 10 mg daily. Obtain lipids and LFTs in 3 months. [name], PA-C 04/17/2019 2:34 PM

## 2019-04-19 ENCOUNTER — Telehealth: Payer: Self-pay | Admitting: *Deleted

## 2019-04-19 DIAGNOSIS — I2510 Atherosclerotic heart disease of native coronary artery without angina pectoris: Secondary | ICD-10-CM

## 2019-04-19 MED ORDER — EZETIMIBE 10 MG PO TABS: 10.0000 mg | ORAL_TABLET | Freq: Every day | ORAL | 6 refills | Status: DC

## 2019-04-19 NOTE — Telephone Encounter (Signed)
Spoke with patient and patient understands  to completley stop taking Lipitor and Start taking Zetia 10 mg once a day and  Fasting labs scheduled for 07-20-19

## 2019-05-18 ENCOUNTER — Other Ambulatory Visit: Payer: Self-pay | Admitting: Physician Assistant

## 2019-07-04 DIAGNOSIS — R69 Illness, unspecified: Secondary | ICD-10-CM | POA: Diagnosis not present

## 2019-07-20 ENCOUNTER — Encounter (INDEPENDENT_AMBULATORY_CARE_PROVIDER_SITE_OTHER): Payer: Self-pay

## 2019-07-20 ENCOUNTER — Other Ambulatory Visit: Payer: Medicare HMO | Admitting: *Deleted

## 2019-07-20 ENCOUNTER — Other Ambulatory Visit: Payer: Self-pay

## 2019-07-20 DIAGNOSIS — I2510 Atherosclerotic heart disease of native coronary artery without angina pectoris: Secondary | ICD-10-CM | POA: Diagnosis not present

## 2019-07-20 LAB — HEPATIC FUNCTION PANEL
ALT: 22 IU/L (ref 0–44)
AST: 20 IU/L (ref 0–40)
Albumin: 4.2 g/dL (ref 3.7–4.7)
Alkaline Phosphatase: 62 IU/L (ref 39–117)
Bilirubin Total: 0.5 mg/dL (ref 0.0–1.2)
Bilirubin, Direct: 0.12 mg/dL (ref 0.00–0.40)
Total Protein: 6.4 g/dL (ref 6.0–8.5)

## 2019-07-20 LAB — LIPID PANEL
Chol/HDL Ratio: 4.5 ratio (ref 0.0–5.0)
Cholesterol, Total: 168 mg/dL (ref 100–199)
HDL: 37 mg/dL — ABNORMAL LOW (ref >39)
LDL Chol Calc (NIH): 106 mg/dL — ABNORMAL HIGH (ref 0–99)
Triglycerides: 140 mg/dL (ref 0–149)
VLDL Cholesterol Cal: 25 mg/dL (ref 5–40)

## 2019-07-21 ENCOUNTER — Other Ambulatory Visit: Payer: Self-pay | Admitting: *Deleted

## 2019-07-21 DIAGNOSIS — E785 Hyperlipidemia, unspecified: Secondary | ICD-10-CM

## 2019-07-21 DIAGNOSIS — I2510 Atherosclerotic heart disease of native coronary artery without angina pectoris: Secondary | ICD-10-CM

## 2019-07-21 MED ORDER — ROSUVASTATIN CALCIUM 10 MG PO TABS: 10.0000 mg | ORAL_TABLET | Freq: Every day | ORAL | 9 refills | Status: DC

## 2019-07-25 NOTE — Progress Notes (Signed)
Cardiology Office Note:    Date:  07/26/2019   ID:  [name], DOB 05/22/1946, MRN [id number]  PCP:  Binnie Rail, MD  Cardiologist:  Mertie Moores, MD  Electrophysiologist:  None   Referring MD: Binnie Rail, MD   Chief Complaint  Patient presents with  . Follow-up    AFib, CAD    History of Present Illness:    [name] is a 73 y.o. male with:   Coronary artery disease  S/p NSTEMI 02/2010 >> PCI: DES to the mid RCA  S/p NSTEMI in 2015 >> PCI: DES to the proximal LAD  C/b post cath TIA  Cath/PCI 01/2015:  DES to mid RCA  Persistent atrial fibrillation  CHADS2-VASc=6 (CVA, CAD, HTN, age x2) >> Apixaban   Hypertension  Hyperlipidemia  Chronic kidney disease  History of pulmonary embolism  Sleep apnea  Prostate CA  [name] was last seen in 02/2019.  [name] returns for follow-up.  [name] is here alone.  Since last seen, [name] notes shortness of breath with activity.  [name] thinks that this may be getting worse.  However, [name] did go to the beach recently and was able to walk without significant shortness of breath.  [name] has not had orthopnea, PND or lower extremity swelling.  [name] has not had chest discomfort like [name] previous angina.  However, [name] did have an episode of "indigestion" about a week ago that improved with Tums.  [name] has not had syncope.  Prior CV studies:   The following studies were reviewed today:  LT Cardiac Monitor (Zio) 01/20/2019  Atrial fibrillation, Avg HR 75  Rare PVCs or aberrantly conducted beats  No significant pauses or episodes of tachycardia  Echo 12/08/2018 EF 55-60, mild LAE, trivial MR, mild TR  Cardiac Catheterization4/15/16 Left mainstem:Normal Left anterior descending (LAD):The stent in the proximal LAD is widely patent. There is a 50% stenosis in the mid vessel.  Left circumflex (LCx):There is a stent in the proximal LCx and it is widely patent. No obstructive disease. Right coronary artery (RCA):The RCA is a large dominant  vessel. It is tortuous. There is diffuse 30% disease in the proximal vessel. There is a focal 90% stenosis in the mid vessel.  Left ventriculography: Not done due to CKD. PCI: 3.5 x 20 mm Synergy DES to mid RCA  Echo 06/04/14 EF 55-60, no RWMA, Gr 1 DD, mild AoV calcification, mild MR, mild LAE  Past Medical History:  Diagnosis Date  . Anemia, mild   . CAD (coronary artery disease)    a. 02/2010 NSTEMI/PCI: PROMUS DES to mRCA. b. 06/05/14 Cath/PCI: pLAD 70%--> s/p PCI/DES (Promus DES);  c. 01/2015 Cath/PCI: LM nl, LAD 91m, patent prox stent, LCX patent prox stent, RCA 30p, 57m (3.5x20 Synergy DES).  . CKD (chronic kidney disease), stage III   . Diastolic dysfunction    a. Echo 6/11: mild LVH, EF 55-60%, GR1DD, Trivial MR, mild LAE. b. echo 06/04/14: EF 55-60%, no WMA, GR1DD, Ao valve mildly calcifed, mild MR, mild LAE  . History of pulmonary embolism   . Hyperlipidemia   . Hypertension    did not tolerate Lisinopril event at low dose  . Melanoma ([place])    a. 1987. b. 2012  . Myocardial infarction ([place])   . Persistent atrial fibrillation ([place]) 12/03/2018   Dx 11/2018 >> Apixaban started // Echo 12/2018: EF 55-60, mod RVE, normal RVSF, mild LAE, trivial MR, mild TR, mod AoV sclerosis   . Prostate cancer (  [place])    Dr Karsten Ro  . Stroke ([place])   . TIA (transient ischemic attack)    a. post cath and PCI on 06/05/2014 - no residual sequelae.  . Vitamin B12 deficiency    Surgical Hx: The patient  has a past surgical history that includes Cholecystectomy; Prostatectomy; Knee arthroscopy; Tonsillectomy; mRCA stent (02/2010); Melanoma excision (2011 ); Melanoma excision (1991); left heart catheterization with coronary angiogram (N/A, 06/05/2014); left heart catheterization with coronary angiogram (N/A, 01/19/2015); and Colonoscopy (2006).   Current Medications: Current Meds  Medication Sig  . amLODipine (NORVASC) 5 MG tablet TAKE 1 TABLET BY MOUTH EVERY DAY NEEDS APPT FOR REFILLS  . apixaban (ELIQUIS)  5 MG TABS tablet Take 1 tablet (5 mg total) by mouth 2 (two) times daily.  . calcium carbonate (TUMS - DOSED IN MG ELEMENTAL CALCIUM) 500 MG chewable tablet Chew 2 tablets by mouth daily as needed for indigestion or heartburn.   . Cyanocobalamin (VITAMIN B12 PO) Take 1 tablet by mouth every evening.  . fish oil-omega-3 fatty acids 1000 MG capsule Take 2 g by mouth 2 (two) times daily.    . metoprolol tartrate (LOPRESSOR) 50 MG tablet TAKE 1 TABLET BY MOUTH TWICE A DAY  . Misc Natural Products (TART CHERRY ADVANCED PO) Take 1,000 mg by mouth daily.  . nitroGLYCERIN (NITROSTAT) 0.4 MG SL tablet Place 1 tablet (0.4 mg total) under the tongue every 5 (five) minutes as needed for chest pain.  Marland Kitchen Potassium Gluconate 2 MEQ TABS Take 1 tablet by mouth daily.  . rosuvastatin (CRESTOR) 10 MG tablet Take 1 tablet (10 mg total) by mouth daily.  . [DISCONTINUED] aspirin 81 MG tablet Take 81 mg by mouth 2 (two) times daily.      Allergies:   Lidocaine   Social History   Tobacco Use  . Smoking status: Never Smoker  . Smokeless tobacco: Never Used  Substance Use Topics  . Alcohol use: No    Alcohol/week: 0.0 standard drinks  . Drug use: No     Family Hx: The patient's family history includes Coronary artery disease in [name] mother; Diabetes in [name] sister; Heart disease in [name] mother; Hypertension in [name] brother; Prostate cancer in [name] father; Transient ischemic attack (age of onset: 59) in [name] mother.  ROS:   Please see the history of present illness.    ROS All other systems reviewed and are negative.   EKGs/Labs/Other Test Reviewed:    EKG:  EKG is   ordered today.  The ekg ordered today demonstrates atrial fibrillation, HR 85, normal axis, QTC 399, no ST-T wave changes, similar to prior tracings  Recent Labs: 12/22/2018: TSH 4.90 01/05/2019: BUN 17; Creatinine, Ser 1.67; Hemoglobin 14.5; NT-Pro BNP 689; Platelets 146; Potassium 4.2; Sodium 140 07/20/2019: ALT 22   Recent Lipid Panel Lab  Results  Component Value Date/Time   CHOL 168 07/20/2019 08:21 AM   CHOL 122 (L) 02/07/2016 07:45 AM   TRIG 140 07/20/2019 08:21 AM   TRIG 113 02/07/2016 07:45 AM   TRIG 220 (HH) 09/07/2006 11:05 AM   HDL 37 (L) 07/20/2019 08:21 AM   HDL 38 (L) 02/07/2016 07:45 AM   CHOLHDL 4.5 07/20/2019 08:21 AM   CHOLHDL 3 09/14/2018 11:30 AM   LDLCALC 106 (H) 07/20/2019 08:21 AM   LDLCALC 61 02/07/2016 07:45 AM   LDLDIRECT 68.8 07/20/2012 08:37 AM    Physical Exam:    VS:  BP (!) 144/90   Pulse 85   Ht 6\' 4"  (  1.93 m)   Wt 215 lb 12.8 oz (97.9 kg)   BMI 26.27 kg/m     Wt Readings from Last 3 Encounters:  07/26/19 215 lb 12.8 oz (97.9 kg)  02/04/19 215 lb (97.5 kg)  01/04/19 221 lb (100.2 kg)     Physical Exam  Constitutional: [name] is oriented to person, place, and time. [name] appears well-developed and well-nourished. No distress.  HENT:  Head: Normocephalic and atraumatic.  Eyes: No scleral icterus.  Neck: No JVD present. No thyromegaly present.  Cardiovascular: Normal rate and normal heart sounds. An irregularly irregular rhythm present.  No murmur heard. Pulmonary/Chest: Effort normal and breath sounds normal. [name] has no rales.  Abdominal: Soft. There is no hepatomegaly.  Musculoskeletal:        General: No edema.  Lymphadenopathy:    [name] has no cervical adenopathy.  Neurological: [name] is alert and oriented to person, place, and time.  Skin: Skin is warm and dry.  Psychiatric: [name] has a normal mood and affect.    ASSESSMENT & PLAN:    1. Coronary artery disease involving native coronary artery of native heart without angina pectoris 2. Shortness of breath History of prior myocardial infarction in 2011 treated with drug-eluting stent to the mid RCA and non-STEMI in 2015 treated with a DES to the proximal LAD and DES to the RCA in 2016. Today, [name] notes [name] has been experiencing shortness of breath with activity.  An echocardiogram in March 2020 demonstrated normal LV function.  [name] does  not appear to be volume overloaded on exam.  [name] lungs are clear.  [name] ECG does not demonstrate any acute ST-T wave changes.  However, with [name] prior history of multiple stents, I recommend proceeding with stress testing to rule out the possibility of progressive coronary artery disease contributing to [name] symptoms.  I will also obtain a CBC to rule out anemia.  -Arrange Lexiscan Myoview  -Obtain BMET, CBC  -Follow-up 6-8 weeks  3. Persistent atrial fibrillation (HCC) Rate is controlled.  [name] seems to be tolerating anticoagulation.  Obtain follow-up BMP, CBC today.  Continue Apixaban.   4. Stage 3b chronic kidney disease Obtain follow-up BMET today  5. Essential hypertension Blood pressure above goal.  However, upon repeat by me, [name] blood pressure is 110/80.  I have asked him to continue to monitor [name] blood pressure over the next week and send those readings for review.  6. Hyperlipidemia LDL goal <70 [name] did have issues with memory loss on Atorvastatin.  [name] is now on rosuvastatin.  [name] seems to have noted shortness of breath since [name] started on Zetia.  If [name] has a normal stress test and blood work, we could try him off of Zetia to see if this helps improve [name] shortness of breath.  If so, I would suggest sending him to the [facility] for PCSK9 inhibitor Rx.   Dispo:  Return in about 8 weeks (around 09/20/2019) for Routine Follow Up, w/ Dr. Acie Fredrickson, or [name], PA-C, (virtual or in-person).   Medication Adjustments/Labs and Tests Ordered: Current medicines are reviewed at length with the patient today.  Concerns regarding medicines are outlined above.  Tests Ordered: Orders Placed This Encounter  Procedures  . CBC  . Basic metabolic panel  . MYOCARDIAL PERFUSION IMAGING  . EKG 12-Lead   Medication Changes: Meds ordered this encounter  Medications  . DISCONTD: aspirin EC 81 MG tablet    Sig: Take 1 tablet (81 mg total) by mouth daily.  Dispense:       Order Specific  Question:   Supervising Provider    Answer:   Lelon Perla [[id number]]  . aspirin EC 81 MG tablet    Sig: Take 1 tablet (81 mg total) by mouth daily.    Dispense:  90 tablet    Refill:  3    Signed, [name], PA-C  07/26/2019 11:00 AM    [facility] [place], [place], [place]  [address] Phone: [phone]; Fax: [phone]

## 2019-07-26 ENCOUNTER — Ambulatory Visit: Payer: Medicare HMO | Admitting: Physician Assistant

## 2019-07-26 ENCOUNTER — Telehealth (HOSPITAL_COMMUNITY): Payer: Self-pay | Admitting: *Deleted

## 2019-07-26 ENCOUNTER — Encounter: Payer: Self-pay | Admitting: *Deleted

## 2019-07-26 ENCOUNTER — Other Ambulatory Visit: Payer: Self-pay

## 2019-07-26 ENCOUNTER — Encounter: Payer: Self-pay | Admitting: Physician Assistant

## 2019-07-26 VITALS — BP 144/90 | HR 85 | Ht 76.0 in | Wt 215.8 lb

## 2019-07-26 DIAGNOSIS — I10 Essential (primary) hypertension: Secondary | ICD-10-CM | POA: Diagnosis not present

## 2019-07-26 DIAGNOSIS — R0602 Shortness of breath: Secondary | ICD-10-CM | POA: Diagnosis not present

## 2019-07-26 DIAGNOSIS — I4819 Other persistent atrial fibrillation: Secondary | ICD-10-CM

## 2019-07-26 DIAGNOSIS — I2510 Atherosclerotic heart disease of native coronary artery without angina pectoris: Secondary | ICD-10-CM

## 2019-07-26 DIAGNOSIS — N1832 Chronic kidney disease, stage 3b: Secondary | ICD-10-CM | POA: Diagnosis not present

## 2019-07-26 DIAGNOSIS — E785 Hyperlipidemia, unspecified: Secondary | ICD-10-CM | POA: Diagnosis not present

## 2019-07-26 LAB — BASIC METABOLIC PANEL
BUN/Creatinine Ratio: 9 — ABNORMAL LOW (ref 10–24)
BUN: 15 mg/dL (ref 8–27)
CO2: 24 mmol/L (ref 20–29)
Calcium: 9.3 mg/dL (ref 8.6–10.2)
Chloride: 102 mmol/L (ref 96–106)
Creatinine, Ser: 1.7 mg/dL — ABNORMAL HIGH (ref 0.76–1.27)
GFR calc Af Amer: 45 mL/min/{1.73_m2} — ABNORMAL LOW (ref >59)
GFR calc non Af Amer: 39 mL/min/{1.73_m2} — ABNORMAL LOW (ref >59)
Glucose: 91 mg/dL (ref 65–99)
Potassium: 4.8 mmol/L (ref 3.5–5.2)
Sodium: 140 mmol/L (ref 134–144)

## 2019-07-26 LAB — CBC
Hematocrit: 41.3 % (ref 37.5–51.0)
Hemoglobin: 14.1 g/dL (ref 13.0–17.7)
MCH: 28.2 pg (ref 26.6–33.0)
MCHC: 34.1 g/dL (ref 31.5–35.7)
MCV: 83 fL (ref 79–97)
Platelets: 145 10*3/uL — ABNORMAL LOW (ref 150–450)
RBC: 5 x10E6/uL (ref 4.14–5.80)
RDW: 14.2 % (ref 11.6–15.4)
WBC: 5.9 10*3/uL (ref 3.4–10.8)

## 2019-07-26 MED ORDER — ASPIRIN EC 81 MG PO TBEC: 81.0000 mg | DELAYED_RELEASE_TABLET | Freq: Every day | ORAL | Status: DC

## 2019-07-26 MED ORDER — ASPIRIN EC 81 MG PO TBEC: 81.0000 mg | DELAYED_RELEASE_TABLET | Freq: Every day | ORAL | 3 refills | Status: DC

## 2019-07-26 NOTE — Patient Instructions (Signed)
Medication Instructions:  Your physician has recommended you make the following change in your medication:  1.  REDUCE the Aspirin to 81 mg 1 daily  *If you need a refill on your cardiac medications before your next appointment, please call your pharmacy*  Lab Work: TODAY:  BMET & CBC  If you have labs (blood work) drawn today and your tests are completely normal, you will receive your results only by: Marland Kitchen MyChart Message (if you have MyChart) OR . A paper copy in the mail If you have any lab test that is abnormal or we need to change your treatment, we will call you to review the results.  Testing/Procedures: Your physician has requested that you have a lexiscan myoview. For further information please visit [email or web address]. Please follow instruction sheet, as given.    Follow-Up: At [facility], you and your health needs are our priority.  As part of our continuing mission to provide you with exceptional heart care, we have created designated Provider Care Teams.  These Care Teams include your primary Cardiologist (physician) and Advanced Practice Providers (APPs -  Physician Assistants and Nurse Practitioners) who all work together to provide you with the care you need, when you need it.  Your next appointment:   6-8 WEEKS WITH DR. Cherlyn Roberts OR SCOTT WEAVER, PA=C  The format for your next appointment:   Either In Person or Virtual  Provider:   You may see Mertie Moores, MD or one of the following Advanced Practice Providers on your designated Care Team:    [name], PA-C  [place], [place]  Daune Perch, NP   Other Instructions  Cardiac Nuclear Scan A cardiac nuclear scan is a test that is done to check the flow of blood to your heart. It is done when you are resting and when you are exercising. The test looks for problems such as:  Not enough blood reaching a portion of the heart.  The heart muscle not working as it should. You may need this test if:  You  have heart disease.  You have had lab results that are not normal.  You have had heart surgery or a balloon procedure to open up blocked arteries (angioplasty).  You have chest pain.  You have shortness of breath. In this test, a special dye (tracer) is put into your bloodstream. The tracer will travel to your heart. A camera will then take pictures of your heart to see how the tracer moves through your heart. This test is usually done at a hospital and takes 2-4 hours. Tell a doctor about:  Any allergies you have.  All medicines you are taking, including vitamins, herbs, eye drops, creams, and over-the-counter medicines.  Any problems you or family members have had with anesthetic medicines.  Any blood disorders you have.  Any surgeries you have had.  Any medical conditions you have.  Whether you are pregnant or may be pregnant. What are the risks? Generally, this is a safe test. However, problems may occur, such as:  Serious chest pain and heart attack. This is only a risk if the stress portion of the test is done.  Rapid heartbeat.  A feeling of warmth in your chest. This feeling usually does not last long.  Allergic reaction to the tracer. What happens before the test?  Ask your doctor about changing or stopping your normal medicines. This is important.  Follow instructions from your doctor about what you cannot eat or drink.  Remove your jewelry on  the day of the test. What happens during the test?  An IV tube will be inserted into one of your veins.  Your doctor will give you a small amount of tracer through the IV tube.  You will wait for 20-40 minutes while the tracer moves through your bloodstream.  Your heart will be monitored with an electrocardiogram (ECG).  You will lie down on an exam table.  Pictures of your heart will be taken for about 15-20 minutes.  You may also have a stress test. For this test, one of these things may be done: ? You will  be asked to exercise on a treadmill or a stationary bike. ? You will be given medicines that will make your heart work harder. This is done if you are unable to exercise.  When blood flow to your heart has peaked, a tracer will again be given through the IV tube.  After 20-40 minutes, you will get back on the exam table. More pictures will be taken of your heart.  Depending on the tracer that is used, more pictures may need to be taken 3-4 hours later.  Your IV tube will be removed when the test is over. The test may vary among doctors and hospitals. What happens after the test?  Ask your doctor: ? Whether you can return to your normal schedule, including diet, activities, and medicines. ? Whether you should drink more fluids. This will help to remove the tracer from your body. Drink enough fluid to keep your pee (urine) pale yellow.  Ask your doctor, or the department that is doing the test: ? When will my results be ready? ? How will I get my results? Summary  A cardiac nuclear scan is a test that is done to check the flow of blood to your heart.  Tell your doctor whether you are pregnant or may be pregnant.  Before the test, ask your doctor about changing or stopping your normal medicines. This is important.  Ask your doctor whether you can return to your normal activities. You may be asked to drink more fluids. This information is not intended to replace advice given to you by your health care provider. Make sure you discuss any questions you have with your health care provider. Document Released: 03/08/2018 Document Revised: 01/12/2019 Document Reviewed: 03/08/2018 Elsevier Patient Education  2020 [employer].

## 2019-07-26 NOTE — Telephone Encounter (Signed)
Patient's wife per DPR was given detailed instructions per Myocardial Perfusion Study Information Sheet for the test on 07/29/2019 at 0730. Patient notified to arrive 15 minutes early and that it is imperative to arrive on time for appointment to keep from having the test rescheduled.  If you need to cancel or reschedule your appointment, please call the office within 24 hours of your appointment. . Patient verbalized understanding.[name], Ranae Palms No my chart

## 2019-07-27 ENCOUNTER — Telehealth: Payer: Self-pay | Admitting: Physician Assistant

## 2019-07-27 NOTE — Telephone Encounter (Signed)
Spoke with wife, DPR on file.  Pt had left already.  Advised her that Nicki Reaper wanted pt to monitor BP for a week and call with all of the readings at once.  Wife said she would make pt aware.

## 2019-07-27 NOTE — Telephone Encounter (Signed)
Patient was advised by Nicki Reaper to call us with his BP reading:  10/21 11:00am  125/90

## 2019-07-29 ENCOUNTER — Ambulatory Visit (HOSPITAL_COMMUNITY): Payer: Medicare HMO | Attending: Internal Medicine

## 2019-07-29 ENCOUNTER — Other Ambulatory Visit: Payer: Self-pay

## 2019-07-29 DIAGNOSIS — R0602 Shortness of breath: Secondary | ICD-10-CM

## 2019-07-29 LAB — MYOCARDIAL PERFUSION IMAGING
LV dias vol: 90 mL (ref 62–150)
LV sys vol: 47 mL
Peak HR: 104 {beats}/min
Rest HR: 85 {beats}/min
SDS: 0
SRS: 0
SSS: 0
TID: 1.03

## 2019-07-29 MED ORDER — TECHNETIUM TC 99M TETROFOSMIN IV KIT
32.7000 | PACK | Freq: Once | INTRAVENOUS | Status: AC | PRN
  Administered 2019-07-29: 32.7 via INTRAVENOUS
  Filled 2019-07-29: qty 33

## 2019-07-29 MED ORDER — TECHNETIUM TC 99M TETROFOSMIN IV KIT
9.7000 | PACK | Freq: Once | INTRAVENOUS | Status: AC | PRN
  Administered 2019-07-29: 9.7 via INTRAVENOUS
  Filled 2019-07-29: qty 10

## 2019-07-29 MED ORDER — REGADENOSON 0.4 MG/5ML IV SOLN
0.4000 mg | Freq: Once | INTRAVENOUS | Status: AC
  Administered 2019-07-29: 0.4 mg via INTRAVENOUS

## 2019-08-01 ENCOUNTER — Encounter: Payer: Self-pay | Admitting: Physician Assistant

## 2019-08-02 ENCOUNTER — Ambulatory Visit (INDEPENDENT_AMBULATORY_CARE_PROVIDER_SITE_OTHER): Payer: Medicare HMO | Admitting: Internal Medicine

## 2019-08-02 ENCOUNTER — Other Ambulatory Visit: Payer: Self-pay

## 2019-08-02 ENCOUNTER — Telehealth: Payer: Self-pay | Admitting: Internal Medicine

## 2019-08-02 ENCOUNTER — Encounter: Payer: Self-pay | Admitting: Internal Medicine

## 2019-08-02 ENCOUNTER — Telehealth: Payer: Self-pay | Admitting: *Deleted

## 2019-08-02 DIAGNOSIS — M109 Gout, unspecified: Secondary | ICD-10-CM | POA: Diagnosis not present

## 2019-08-02 DIAGNOSIS — I4819 Other persistent atrial fibrillation: Secondary | ICD-10-CM

## 2019-08-02 MED ORDER — ALLOPURINOL 100 MG PO TABS: 100.0000 mg | ORAL_TABLET | Freq: Every day | ORAL | 6 refills | Status: DC

## 2019-08-02 MED ORDER — PREDNISONE 20 MG PO TABS: 40.0000 mg | ORAL_TABLET | Freq: Every day | ORAL | 1 refills | Status: DC

## 2019-08-02 MED ORDER — DILTIAZEM HCL ER COATED BEADS 240 MG PO CP24: 240.0000 mg | ORAL_CAPSULE | Freq: Every day | ORAL | 3 refills | Status: DC

## 2019-08-02 NOTE — Patient Instructions (Addendum)
Take the prednisone as prescribed.  Start the allopurinol daily ( gout prevention).    Refill the prednisone and hang on to it just in case you have another gout flare - take it only if needed.       Gout  Gout is a condition that causes painful swelling of the joints. Gout is a type of inflammation of the joints (arthritis). This condition is caused by having too much uric acid in the body. Uric acid is a chemical that forms when the body breaks down substances called purines. Purines are important for building body proteins. When the body has too much uric acid, sharp crystals can form and build up inside the joints. This causes pain and swelling. Gout attacks can happen quickly and may be very painful (acute gout). Over time, the attacks can affect more joints and become more frequent (chronic gout). Gout can also cause uric acid to build up under the skin and inside the kidneys. What are the causes? This condition is caused by too much uric acid in your blood. This can happen because:  Your kidneys do not remove enough uric acid from your blood. This is the most common cause.  Your body makes too much uric acid. This can happen with some cancers and cancer treatments. It can also occur if your body is breaking down too many red blood cells (hemolytic anemia).  You eat too many foods that are high in purines. These foods include organ meats and some seafood. Alcohol, especially beer, is also high in purines. A gout attack may be triggered by trauma or stress. What increases the risk? You are more likely to develop this condition if you:  Have a family history of gout.  Are male and middle-aged.  Are male and have gone through menopause.  Are obese.  Frequently drink alcohol, especially beer.  Are dehydrated.  Lose weight too quickly.  Have an organ transplant.  Have lead poisoning.  Take certain medicines, including aspirin, cyclosporine, diuretics, levodopa, and  niacin.  Have kidney disease.  Have a skin condition called psoriasis. What are the signs or symptoms? An attack of acute gout happens quickly. It usually occurs in just one joint. The most common place is the big toe. Attacks often start at night. Other joints that may be affected include joints of the feet, ankle, knee, fingers, wrist, or elbow. Symptoms of this condition may include:  Severe pain.  Warmth.  Swelling.  Stiffness.  Tenderness. The affected joint may be very painful to touch.  Shiny, red, or purple skin.  Chills and fever. Chronic gout may cause symptoms more frequently. More joints may be involved. You may also have white or yellow lumps (tophi) on your hands or feet or in other areas near your joints. How is this diagnosed? This condition is diagnosed based on your symptoms, medical history, and physical exam. You may have tests, such as:  Blood tests to measure uric acid levels.  Removal of joint fluid with a thin needle (aspiration) to look for uric acid crystals.  X-rays to look for joint damage. How is this treated? Treatment for this condition has two phases: treating an acute attack and preventing future attacks. Acute gout treatment may include medicines to reduce pain and swelling, including:  NSAIDs.  Steroids. These are strong anti-inflammatory medicines that can be taken by mouth (orally) or injected into a joint.  Colchicine. This medicine relieves pain and swelling when it is taken soon after an attack.  It can be given by mouth or through an IV. Preventive treatment may include:  Daily use of smaller doses of NSAIDs or colchicine.  Use of a medicine that reduces uric acid levels in your blood.  Changes to your diet. You may need to see a dietitian about what to eat and drink to prevent gout. Follow these instructions at home: During a gout attack   If directed, put ice on the affected area: ? Put ice in a plastic bag. ? Place a towel  between your skin and the bag. ? Leave the ice on for 20 minutes, 2-3 times a day.  Raise (elevate) the affected joint above the level of your heart as often as possible.  Rest the joint as much as possible. If the affected joint is in your leg, you may be given crutches to use.  Follow instructions from your health care provider about eating or drinking restrictions. Avoiding future gout attacks  Follow a low-purine diet as told by your dietitian or health care provider. Avoid foods and drinks that are high in purines, including liver, kidney, anchovies, asparagus, herring, mushrooms, mussels, and beer.  Maintain a healthy weight or lose weight if you are overweight. If you want to lose weight, talk with your health care provider. It is important that you do not lose weight too quickly.  Start or maintain an exercise program as told by your health care provider. Eating and drinking  Drink enough fluids to keep your urine pale yellow.  If you drink alcohol: ? Limit how much you use to:  0-1 drink a day for women.  0-2 drinks a day for men. ? Be aware of how much alcohol is in your drink. In the [place] one drink equals one 12 oz bottle of beer (355 mL) one 5 oz glass of wine (148 mL), or one 1 oz glass of hard liquor (44 mL). General instructions  Take over-the-counter and prescription medicines only as told by your health care provider.  Do not drive or use heavy machinery while taking prescription pain medicine.  Return to your normal activities as told by your health care provider. Ask your health care provider what activities are safe for you.  Keep all follow-up visits as told by your health care provider. This is important. Contact a health care provider if you have:  Another gout attack.  Continuing symptoms of a gout attack after 10 days of treatment.  Side effects from your medicines.  Chills or a fever.  Burning pain when you urinate.  Pain in your lower back  or belly. Get help right away if you:  Have severe or uncontrolled pain.  Cannot urinate. Summary  Gout is painful swelling of the joints caused by inflammation.  The most common site of pain is the big toe, but it can affect other joints in the body.  Medicines and dietary changes can help to prevent and treat gout attacks. This information is not intended to replace advice given to you by your health care provider. Make sure you discuss any questions you have with your health care provider. Document Released: 09/19/2000 Document Revised: 04/14/2018 Document Reviewed: 04/14/2018 Elsevier Patient Education  2020 [employer].

## 2019-08-02 NOTE — Telephone Encounter (Signed)
I s/w both the pt and his wife and went over recommendations per [name], PAC.  PLAN:  1. Decrease Metoprolol to 25 mg twice daily x 3 days, then stop. 2. DC Amlodipine. 3. Start Diltiazem CD 240 mg once daily.  He can start now (does not have to wait until off Metoprolol). 4. Monitor BP after medication changes.  Check once daily and send readings after 2 weeks. 5. Schedule 24 Holter 2 weeks after med change (to assess for HR control in AFib).   6. FU with me in 6 weeks to see how he is feeling with the med changes. [name], PA-C    08/02/2019 5:09 PM    Both the pt and his wife are agreeable to plan of care. New Rx has been sent in for Diltiazem CD 240 mg daily to [facility] [place] [facility] [place]. # 90 x 3; pt will start Diltiazem tomorrow 08/03/19. Pt aware to d/c Amlodipine today. Decrease Metoprolol to 25 mg BID x 3 days then STOP. 24 hour Holter monitor ordered; will have [facility] call the pt tomorrow to schedule appt for monitor to be put on in 2 weeks. Pt will monitor his BP and call readings in 2 weeks. Pt has been scheduled to [name], [facility] 09/13/19 @ 8:45 am.   Pt also wanted to let PA know that PCP did put the pt on Prednisone and Allopurinol today for gout. Pt and his wife thanked me for the call and our help.

## 2019-08-02 NOTE — Telephone Encounter (Signed)
Appointment scheduled.

## 2019-08-02 NOTE — Progress Notes (Signed)
Subjective:    Patient ID: [name], male    DOB: [birth year], 73 y.o.   MRN: [id number]  HPI The patient is here for an acute visit.   Friday he started having gout pain inhis left first toe.  He denies injury.  The joint is red, inflammed, painful.  He dneies chills or fever.    Medications and allergies reviewed with patient and updated if appropriate.  Patient Active Problem List   Diagnosis Date Noted  . Persistent atrial fibrillation ([place]) 12/03/2018  . Umbilical hernia without obstruction or gangrene 09/14/2018  . B12 deficiency 09/14/2018  . Erectile dysfunction 08/04/2016  . Elevated TSH 08/29/2015  . S/P coronary artery stent placement, 01/19/15 S/p DES of mid RCA 01/20/2015  . Prediabetes 10/03/2014  . TIA (transient ischemic attack) 07/31/2014  . Cerebral infarction ([place]) 06/05/2014  . NSTEMI (non-ST elevated myocardial infarction) ([place]) 06/04/2014  . Chronic kidney disease (CKD), stage III (moderate) 06/14/2010  . Coronary artery disease involving native coronary artery of native heart without angina pectoris 03/01/2010  . TREMOR, ESSENTIAL, RIGHT HAND 04/24/2009  . PROSTATE CANCER, HX OF 04/24/2009  . Melanoma ([place]) 04/24/2009  . Hyperlipidemia LDL goal <70 10/28/2007  . Essential hypertension 10/13/2007    Current Outpatient Medications on File Prior to Visit  Medication Sig Dispense Refill  . amLODipine (NORVASC) 5 MG tablet TAKE 1 TABLET BY MOUTH EVERY DAY NEEDS APPT FOR REFILLS 90 tablet 3  . apixaban (ELIQUIS) 5 MG TABS tablet Take 1 tablet (5 mg total) by mouth 2 (two) times daily. 60 tablet 11  . aspirin EC 81 MG tablet Take 1 tablet (81 mg total) by mouth daily. 90 tablet 3  . calcium carbonate (TUMS - DOSED IN MG ELEMENTAL CALCIUM) 500 MG chewable tablet Chew 2 tablets by mouth daily as needed for indigestion or heartburn.     . Cyanocobalamin (VITAMIN B12 PO) Take 1 tablet by mouth every evening.    . fish oil-omega-3 fatty acids 1000 MG capsule  Take 2 g by mouth 2 (two) times daily.      . metoprolol tartrate (LOPRESSOR) 50 MG tablet TAKE 1 TABLET BY MOUTH TWICE A DAY 180 tablet 3  . Misc Natural Products (TART CHERRY ADVANCED PO) Take 1,000 mg by mouth daily.    . nitroGLYCERIN (NITROSTAT) 0.4 MG SL tablet Place 1 tablet (0.4 mg total) under the tongue every 5 (five) minutes as needed for chest pain. 30 tablet 3  . Potassium Gluconate 2 MEQ TABS Take 1 tablet by mouth daily.    . rosuvastatin (CRESTOR) 10 MG tablet Take 1 tablet (10 mg total) by mouth daily. 30 tablet 9  . ezetimibe (ZETIA) 10 MG tablet Take 1 tablet (10 mg total) by mouth daily. 30 tablet 6   No current facility-administered medications on file prior to visit.     Past Medical History:  Diagnosis Date  . Anemia, mild   . CAD (coronary artery disease)    a. 02/2010 NSTEMI/PCI: PROMUS DES to mRCA. b. 06/05/14 Cath/PCI: pLAD 70%--> s/p PCI/DES (Promus DES);  c. 01/2015 Cath/PCI: LM nl, LAD 82m, patent prox stent, LCX patent prox stent, RCA 30p, 3m (3.5x20 Synergy DES). // Myoview 07/2019:  EF 48, atten artifact, no ischemia, intermediate risk (low EF)   . CKD (chronic kidney disease), stage III   . Diastolic dysfunction    a. Echo 6/11: mild LVH, EF 55-60%, GR1DD, Trivial MR, mild LAE. b. echo 06/04/14: EF 55-60%, no WMA,  GR1DD, Ao valve mildly calcifed, mild MR, mild LAE  . History of pulmonary embolism   . Hyperlipidemia   . Hypertension    did not tolerate Lisinopril event at low dose  . Melanoma ([place])    a. 1987. b. 2012  . Myocardial infarction ([place])   . Persistent atrial fibrillation ([place]) 12/03/2018   Dx 11/2018 >> Apixaban started // Echo 12/2018: EF 55-60, mod RVE, normal RVSF, mild LAE, trivial MR, mild TR, mod AoV sclerosis   . Prostate cancer ([place])    Dr Karsten Ro  . Stroke ([place])   . TIA (transient ischemic attack)    a. post cath and PCI on 06/05/2014 - no residual sequelae.  . Vitamin B12 deficiency     Past Surgical History:  Procedure Laterality  Date  . CHOLECYSTECTOMY    . COLONOSCOPY  2006  . KNEE ARTHROSCOPY     right  . LEFT HEART CATHETERIZATION WITH CORONARY ANGIOGRAM N/A 06/05/2014   Procedure: LEFT HEART CATHETERIZATION WITH CORONARY ANGIOGRAM;  Surgeon: Blane Ohara, MD;  Location: [facility] CATH LAB;  Service: Cardiovascular;  Laterality: N/A;  . LEFT HEART CATHETERIZATION WITH CORONARY ANGIOGRAM N/A 01/19/2015   Procedure: LEFT HEART CATHETERIZATION WITH CORONARY ANGIOGRAM;  Surgeon: Peter M Martinique, MD;  Location: [facility] CATH LAB;  Service: Cardiovascular;  Laterality: N/A;  . MELANOMA EXCISION  2011    L chest  . MELANOMA EXCISION  1991   back  . mRCA stent  02/2010  . PROSTATECTOMY     Dr. Karsten Ro  . TONSILLECTOMY      Social History   Socioeconomic History  . Marital status: Married    Spouse name: Not on file  . Number of children: Not on file  . Years of education: Not on file  . Highest education level: Not on file  Occupational History  . Not on file  Social Needs  . Financial resource strain: Not on file  . Food insecurity    Worry: Not on file    Inability: Not on file  . Transportation needs    Medical: Not on file    Non-medical: Not on file  Tobacco Use  . Smoking status: Never Smoker  . Smokeless tobacco: Never Used  Substance and Sexual Activity  . Alcohol use: No    Alcohol/week: 0.0 standard drinks  . Drug use: No  . Sexual activity: Not on file  Lifestyle  . Physical activity    Days per week: Not on file    Minutes per session: Not on file  . Stress: Not on file  Relationships  . Social Herbalist on phone: Not on file    Gets together: Not on file    Attends religious service: Not on file    Active member of club or organization: Not on file    Attends meetings of clubs or organizations: Not on file    Relationship status: Not on file  Other Topics Concern  . Not on file  Social History Narrative  . Not on file    Family History  Problem Relation Age of Onset  .  Coronary artery disease Mother        S/P CABG, in her 57s  . Transient ischemic attack Mother 61  . Heart disease Mother   . Prostate cancer Father   . Hypertension Brother   . Diabetes Sister     Review of Systems  Constitutional: Negative for chills and fever.  Musculoskeletal: Positive for  arthralgias and joint swelling.  Skin: Positive for color change. Negative for rash and wound.       Objective:   Vitals:   08/02/19 1421  BP: 126/74  Pulse: 71  Resp: 16  Temp: 99.9 F (37.7 C)  SpO2: 97%   BP Readings from Last 3 Encounters:  08/02/19 126/74  07/26/19 (!) 144/90  02/04/19 130/70   Wt Readings from Last 3 Encounters:  08/02/19 212 lb (96.2 kg)  07/29/19 215 lb (97.5 kg)  07/26/19 215 lb 12.8 oz (97.9 kg)   Body mass index is 25.81 kg/m.   Physical Exam Constitutional:      General: He is not in acute distress.    Appearance: Normal appearance. He is not ill-appearing.  HENT:     Head: Normocephalic and atraumatic.  Musculoskeletal:     Comments: Left great toe MTP joint swollen, erythema, warmth and tender;  Normal sensation  Skin:    General: Skin is warm and dry.  Neurological:     Mental Status: He is alert.            Assessment & Plan:    See Problem List for Assessment and Plan of chronic medical problems.

## 2019-08-02 NOTE — Telephone Encounter (Signed)
Pt has been notified of Myoview results by phone with verbal understanding. D/w pt possible recommendation for medication per [name], PA to d/c Metoprolol and Amlodipine and start Diltiazem, med changes due to fatigue and sob. Pt would like to make the med changes. I assured the pt I will let the PA know he would like to change medications and that we will call back with Diltiazem dose once we hear back from [place], [facility]. Pt also wanted to let me know that PA wanted him to call in BP readings. 10/21 125/90 @ 11 am-110/76 @ 5 pm                       10/22 121/81 @ 9 am-132/92 @ 5:30 pm                       10/23 161/98 @ 6 am; pt was having Myoview this day; 121/75 @ 7 pm                       10/24 118/85 @ 10:30 am-128/80 @ 7 pm                       10/25 133/91 @ 10 am-119/83 @ 9:30 pm                       10/26 127/84 @ 9:45 am- 144/101 @ 8:45 pt c/o gout flare up starting Saturday 10/24; pt has placed call into PCP for gout                       10/27 136/103 @ 8 am Pt did not list HR though states most HR were between 70-80's Patient notified of result.  Please refer to phone note from today for complete details.   Julaine Hua, CMA 08/02/2019 10:18 AM

## 2019-08-02 NOTE — Telephone Encounter (Signed)
PLAN:  1. Decrease Metoprolol to 25 mg twice daily x 3 days, then stop. 2. DC Amlodipine. 3. Start Diltiazem CD 240 mg once daily.  He can start now (does not have to wait until off Metoprolol). 4. Monitor BP after medication changes.  Check once daily and send readings after 2 weeks. 5. Schedule 24 Holter 2 weeks after med change (to assess for HR control in AFib).   6. FU with me in 6 weeks to see how he is feeling with the med changes. [name], PA-C    08/02/2019 5:09 PM

## 2019-08-02 NOTE — Assessment & Plan Note (Signed)
Left first toe gout Prednisone 40 mg x 5 days Start allopurinol 100 mg daily Will check uric acid level at next visit Call if no improvement

## 2019-08-02 NOTE — Telephone Encounter (Signed)
-----   Message from Liliane Shi, [place] sent at 08/01/2019  5:35 PM EDT ----- Please call the patient. The stress test demonstrates normal blood flow.  The ejection fraction is low on this study but was normal on recent echocardiogram.  The echocardiogram is a more accurate test to assess ejection fraction, so I think his EF is normal.   He has mentioned a lack of energy in the past as well as shortness of breath.  We could adjust his medications to get him off of Metoprolol, which may cause fatigue.  It would mean stopping amlodipine and metoprolol and starting him on Diltiazem.   PLAN:   - If patient is interested in adjusting medications as noted above, let me know and I can arrange.   - Send Copy to PCP [name], PA-C    08/01/2019 5:26 PM

## 2019-08-02 NOTE — Telephone Encounter (Signed)
Patient called and would like to know if Dr. Senaida Ores would call him something in for gout. He stated that it is bad. If any question please call him back.

## 2019-08-03 ENCOUNTER — Inpatient Hospital Stay (HOSPITAL_COMMUNITY)
Admission: EM | Admit: 2019-08-03 | Discharge: 2019-08-06 | DRG: 247 | Disposition: A | Discharge status: Home / Self Care | Payer: Medicare HMO | Attending: Cardiology | Admitting: Cardiology

## 2019-08-03 ENCOUNTER — Encounter (HOSPITAL_COMMUNITY): Payer: Self-pay

## 2019-08-03 ENCOUNTER — Emergency Department (HOSPITAL_COMMUNITY): Payer: Medicare HMO

## 2019-08-03 ENCOUNTER — Other Ambulatory Visit: Payer: Self-pay

## 2019-08-03 DIAGNOSIS — I129 Hypertensive chronic kidney disease with stage 1 through stage 4 chronic kidney disease, or unspecified chronic kidney disease: Secondary | ICD-10-CM | POA: Diagnosis present

## 2019-08-03 DIAGNOSIS — I252 Old myocardial infarction: Secondary | ICD-10-CM

## 2019-08-03 DIAGNOSIS — I200 Unstable angina: Secondary | ICD-10-CM | POA: Diagnosis present

## 2019-08-03 DIAGNOSIS — Z8249 Family history of ischemic heart disease and other diseases of the circulatory system: Secondary | ICD-10-CM

## 2019-08-03 DIAGNOSIS — Z8673 Personal history of transient ischemic attack (TIA), and cerebral infarction without residual deficits: Secondary | ICD-10-CM

## 2019-08-03 DIAGNOSIS — Z7952 Long term (current) use of systemic steroids: Secondary | ICD-10-CM

## 2019-08-03 DIAGNOSIS — Z884 Allergy status to anesthetic agent status: Secondary | ICD-10-CM

## 2019-08-03 DIAGNOSIS — N183 Chronic kidney disease, stage 3 unspecified: Secondary | ICD-10-CM | POA: Diagnosis present

## 2019-08-03 DIAGNOSIS — R000 Tachycardia, unspecified: Secondary | ICD-10-CM | POA: Diagnosis not present

## 2019-08-03 DIAGNOSIS — Z7901 Long term (current) use of anticoagulants: Secondary | ICD-10-CM

## 2019-08-03 DIAGNOSIS — Z20828 Contact with and (suspected) exposure to other viral communicable diseases: Secondary | ICD-10-CM | POA: Diagnosis present

## 2019-08-03 DIAGNOSIS — I4819 Other persistent atrial fibrillation: Secondary | ICD-10-CM | POA: Diagnosis present

## 2019-08-03 DIAGNOSIS — R0902 Hypoxemia: Secondary | ICD-10-CM | POA: Diagnosis not present

## 2019-08-03 DIAGNOSIS — Z85820 Personal history of malignant melanoma of skin: Secondary | ICD-10-CM

## 2019-08-03 DIAGNOSIS — Z7982 Long term (current) use of aspirin: Secondary | ICD-10-CM

## 2019-08-03 DIAGNOSIS — Z955 Presence of coronary angioplasty implant and graft: Secondary | ICD-10-CM

## 2019-08-03 DIAGNOSIS — Z888 Allergy status to other drugs, medicaments and biological substances status: Secondary | ICD-10-CM

## 2019-08-03 DIAGNOSIS — Z833 Family history of diabetes mellitus: Secondary | ICD-10-CM

## 2019-08-03 DIAGNOSIS — I25110 Atherosclerotic heart disease of native coronary artery with unstable angina pectoris: Principal | ICD-10-CM | POA: Diagnosis present

## 2019-08-03 DIAGNOSIS — I10 Essential (primary) hypertension: Secondary | ICD-10-CM | POA: Diagnosis present

## 2019-08-03 DIAGNOSIS — Z8546 Personal history of malignant neoplasm of prostate: Secondary | ICD-10-CM

## 2019-08-03 DIAGNOSIS — E538 Deficiency of other specified B group vitamins: Secondary | ICD-10-CM | POA: Diagnosis present

## 2019-08-03 DIAGNOSIS — R072 Precordial pain: Secondary | ICD-10-CM | POA: Diagnosis not present

## 2019-08-03 DIAGNOSIS — I4891 Unspecified atrial fibrillation: Secondary | ICD-10-CM | POA: Diagnosis not present

## 2019-08-03 DIAGNOSIS — E785 Hyperlipidemia, unspecified: Secondary | ICD-10-CM | POA: Diagnosis present

## 2019-08-03 DIAGNOSIS — R0789 Other chest pain: Secondary | ICD-10-CM | POA: Diagnosis not present

## 2019-08-03 DIAGNOSIS — Z8042 Family history of malignant neoplasm of prostate: Secondary | ICD-10-CM

## 2019-08-03 DIAGNOSIS — M109 Gout, unspecified: Secondary | ICD-10-CM | POA: Diagnosis present

## 2019-08-03 DIAGNOSIS — Z86711 Personal history of pulmonary embolism: Secondary | ICD-10-CM

## 2019-08-03 DIAGNOSIS — R079 Chest pain, unspecified: Secondary | ICD-10-CM | POA: Diagnosis not present

## 2019-08-03 LAB — BASIC METABOLIC PANEL
Anion gap: 10 (ref 5–15)
BUN: 18 mg/dL (ref 8–23)
CO2: 26 mmol/L (ref 22–32)
Calcium: 10.2 mg/dL (ref 8.9–10.3)
Chloride: 102 mmol/L (ref 98–111)
Creatinine, Ser: 1.68 mg/dL — ABNORMAL HIGH (ref 0.61–1.24)
GFR calc Af Amer: 45 mL/min — ABNORMAL LOW (ref >60)
GFR calc non Af Amer: 39 mL/min — ABNORMAL LOW (ref >60)
Glucose, Bld: 134 mg/dL — ABNORMAL HIGH (ref 70–99)
Potassium: 4.7 mmol/L (ref 3.5–5.1)
Sodium: 138 mmol/L (ref 135–145)

## 2019-08-03 LAB — TROPONIN I (HIGH SENSITIVITY)
Troponin I (High Sensitivity): <2 ng/L (ref <18)
Troponin I (High Sensitivity): <2 ng/L (ref <18)

## 2019-08-03 LAB — CBC
HCT: 44.1 % (ref 39.0–52.0)
Hemoglobin: 14.9 g/dL (ref 13.0–17.0)
MCH: 28 pg (ref 26.0–34.0)
MCHC: 33.8 g/dL (ref 30.0–36.0)
MCV: 82.9 fL (ref 80.0–100.0)
Platelets: 161 10*3/uL (ref 150–400)
RBC: 5.32 MIL/uL (ref 4.22–5.81)
RDW: 13.6 % (ref 11.5–15.5)
WBC: 8.6 10*3/uL (ref 4.0–10.5)
nRBC: 0 % (ref 0.0–0.2)

## 2019-08-03 MED ORDER — SODIUM CHLORIDE 0.9% FLUSH: 3.0000 mL | Freq: Once | INTRAVENOUS | Status: DC

## 2019-08-03 NOTE — ED Notes (Addendum)
WifeLelon [phone]

## 2019-08-03 NOTE — ED Triage Notes (Signed)
To triage via EMS.  Onset last night c/o mid chest pain, took 4 tums that resolved pain.  This morning pain returned as he walked outside and up 3 steps.  Pain worse with exertion, pain resolves at rest.   Pt had chest pain as he stood from stretcher, resolved after few minutes sitting in chair.   Pt took ASA 81 mg prior to EMS arrival.   EMS BP 158/93 HR 100 SpO2 98%

## 2019-08-04 DIAGNOSIS — Z888 Allergy status to other drugs, medicaments and biological substances status: Secondary | ICD-10-CM | POA: Diagnosis not present

## 2019-08-04 DIAGNOSIS — I10 Essential (primary) hypertension: Secondary | ICD-10-CM | POA: Diagnosis not present

## 2019-08-04 DIAGNOSIS — E785 Hyperlipidemia, unspecified: Secondary | ICD-10-CM | POA: Diagnosis not present

## 2019-08-04 DIAGNOSIS — I2583 Coronary atherosclerosis due to lipid rich plaque: Secondary | ICD-10-CM | POA: Diagnosis not present

## 2019-08-04 DIAGNOSIS — Z7952 Long term (current) use of systemic steroids: Secondary | ICD-10-CM | POA: Diagnosis not present

## 2019-08-04 DIAGNOSIS — I252 Old myocardial infarction: Secondary | ICD-10-CM | POA: Diagnosis not present

## 2019-08-04 DIAGNOSIS — I200 Unstable angina: Secondary | ICD-10-CM | POA: Diagnosis not present

## 2019-08-04 DIAGNOSIS — R079 Chest pain, unspecified: Secondary | ICD-10-CM | POA: Diagnosis present

## 2019-08-04 DIAGNOSIS — I2510 Atherosclerotic heart disease of native coronary artery without angina pectoris: Secondary | ICD-10-CM

## 2019-08-04 DIAGNOSIS — I25110 Atherosclerotic heart disease of native coronary artery with unstable angina pectoris: Secondary | ICD-10-CM | POA: Diagnosis not present

## 2019-08-04 DIAGNOSIS — Z955 Presence of coronary angioplasty implant and graft: Secondary | ICD-10-CM | POA: Diagnosis not present

## 2019-08-04 DIAGNOSIS — N183 Chronic kidney disease, stage 3 unspecified: Secondary | ICD-10-CM | POA: Diagnosis not present

## 2019-08-04 DIAGNOSIS — I361 Nonrheumatic tricuspid (valve) insufficiency: Secondary | ICD-10-CM | POA: Diagnosis not present

## 2019-08-04 DIAGNOSIS — Z833 Family history of diabetes mellitus: Secondary | ICD-10-CM | POA: Diagnosis not present

## 2019-08-04 DIAGNOSIS — Z20828 Contact with and (suspected) exposure to other viral communicable diseases: Secondary | ICD-10-CM | POA: Diagnosis not present

## 2019-08-04 DIAGNOSIS — Z7982 Long term (current) use of aspirin: Secondary | ICD-10-CM | POA: Diagnosis not present

## 2019-08-04 DIAGNOSIS — I4891 Unspecified atrial fibrillation: Secondary | ICD-10-CM | POA: Diagnosis not present

## 2019-08-04 DIAGNOSIS — M109 Gout, unspecified: Secondary | ICD-10-CM | POA: Diagnosis present

## 2019-08-04 DIAGNOSIS — I4819 Other persistent atrial fibrillation: Secondary | ICD-10-CM

## 2019-08-04 DIAGNOSIS — E7800 Pure hypercholesterolemia, unspecified: Secondary | ICD-10-CM | POA: Diagnosis not present

## 2019-08-04 DIAGNOSIS — Z8673 Personal history of transient ischemic attack (TIA), and cerebral infarction without residual deficits: Secondary | ICD-10-CM | POA: Diagnosis not present

## 2019-08-04 DIAGNOSIS — Z7901 Long term (current) use of anticoagulants: Secondary | ICD-10-CM | POA: Diagnosis not present

## 2019-08-04 DIAGNOSIS — Z86711 Personal history of pulmonary embolism: Secondary | ICD-10-CM | POA: Diagnosis not present

## 2019-08-04 DIAGNOSIS — I340 Nonrheumatic mitral (valve) insufficiency: Secondary | ICD-10-CM | POA: Diagnosis not present

## 2019-08-04 DIAGNOSIS — Z8546 Personal history of malignant neoplasm of prostate: Secondary | ICD-10-CM | POA: Diagnosis not present

## 2019-08-04 DIAGNOSIS — Z884 Allergy status to anesthetic agent status: Secondary | ICD-10-CM | POA: Diagnosis not present

## 2019-08-04 DIAGNOSIS — Z85820 Personal history of malignant melanoma of skin: Secondary | ICD-10-CM | POA: Diagnosis not present

## 2019-08-04 DIAGNOSIS — E538 Deficiency of other specified B group vitamins: Secondary | ICD-10-CM | POA: Diagnosis present

## 2019-08-04 DIAGNOSIS — Z8249 Family history of ischemic heart disease and other diseases of the circulatory system: Secondary | ICD-10-CM | POA: Diagnosis not present

## 2019-08-04 DIAGNOSIS — I129 Hypertensive chronic kidney disease with stage 1 through stage 4 chronic kidney disease, or unspecified chronic kidney disease: Secondary | ICD-10-CM | POA: Diagnosis not present

## 2019-08-04 DIAGNOSIS — Z8042 Family history of malignant neoplasm of prostate: Secondary | ICD-10-CM | POA: Diagnosis not present

## 2019-08-04 LAB — APTT
aPTT: 25 s (ref 24–36)
aPTT: 58 seconds — ABNORMAL HIGH (ref 24–36)

## 2019-08-04 LAB — BASIC METABOLIC PANEL
Anion gap: 12 (ref 5–15)
BUN: 26 mg/dL — ABNORMAL HIGH (ref 8–23)
CO2: 22 mmol/L (ref 22–32)
Calcium: 9.1 mg/dL (ref 8.9–10.3)
Chloride: 104 mmol/L (ref 98–111)
Creatinine, Ser: 1.56 mg/dL — ABNORMAL HIGH (ref 0.61–1.24)
GFR calc Af Amer: 50 mL/min — ABNORMAL LOW (ref >60)
GFR calc non Af Amer: 43 mL/min — ABNORMAL LOW (ref >60)
Glucose, Bld: 106 mg/dL — ABNORMAL HIGH (ref 70–99)
Potassium: 3.6 mmol/L (ref 3.5–5.1)
Sodium: 138 mmol/L (ref 135–145)

## 2019-08-04 LAB — HEPARIN LEVEL (UNFRACTIONATED)
Heparin Unfractionated: 1.14 [IU]/mL — ABNORMAL HIGH (ref 0.30–0.70)
Heparin Unfractionated: 1.08 IU/mL — ABNORMAL HIGH (ref 0.30–0.70)

## 2019-08-04 LAB — SARS CORONAVIRUS 2 BY RT PCR (HOSPITAL ORDER, PERFORMED IN ~~LOC~~ HOSPITAL LAB): SARS Coronavirus 2: NEGATIVE

## 2019-08-04 LAB — T4, FREE: Free T4: 1.07 ng/dL (ref 0.61–1.12)

## 2019-08-04 LAB — HEMOGLOBIN A1C
Hgb A1c MFr Bld: 5.5 % (ref 4.8–5.6)
Mean Plasma Glucose: 111.15 mg/dL

## 2019-08-04 LAB — SURGICAL PCR SCREEN
MRSA, PCR: NEGATIVE
Staphylococcus aureus: POSITIVE — AB

## 2019-08-04 MED ORDER — ACETAMINOPHEN 325 MG PO TABS
650.0000 mg | ORAL_TABLET | ORAL | Status: DC | PRN
  Administered 2019-08-05: 325 mg via ORAL
  Filled 2019-08-04: qty 2

## 2019-08-04 MED ORDER — ALPRAZOLAM 0.25 MG PO TABS: 0.2500 mg | ORAL_TABLET | Freq: Two times a day (BID) | ORAL | Status: DC | PRN

## 2019-08-04 MED ORDER — SODIUM CHLORIDE 0.9% FLUSH: 3.0000 mL | INTRAVENOUS | Status: DC | PRN

## 2019-08-04 MED ORDER — SODIUM CHLORIDE 0.9 % IV SOLN: 250.0000 mL | INTRAVENOUS | Status: DC | PRN

## 2019-08-04 MED ORDER — ONDANSETRON HCL 4 MG/2ML IJ SOLN: 4.0000 mg | Freq: Four times a day (QID) | INTRAMUSCULAR | Status: DC | PRN

## 2019-08-04 MED ORDER — HEPARIN BOLUS VIA INFUSION
4000.0000 [IU] | Freq: Once | INTRAVENOUS | Status: AC
  Administered 2019-08-04: 4000 [IU] via INTRAVENOUS
  Filled 2019-08-04: qty 4000

## 2019-08-04 MED ORDER — SODIUM CHLORIDE 0.9 % WEIGHT BASED INFUSION
1.0000 mL/kg/h | INTRAVENOUS | Status: DC
  Administered 2019-08-04: 1 mL/kg/h via INTRAVENOUS

## 2019-08-04 MED ORDER — HEPARIN (PORCINE) 25000 UT/250ML-% IV SOLN
1350.0000 [IU]/h | INTRAVENOUS | Status: DC
  Administered 2019-08-04: 1150 [IU]/h via INTRAVENOUS
  Filled 2019-08-04 (×2): qty 250

## 2019-08-04 MED ORDER — ASPIRIN 81 MG PO CHEW
81.0000 mg | CHEWABLE_TABLET | ORAL | Status: AC
  Administered 2019-08-04: 81 mg via ORAL
  Filled 2019-08-04: qty 1

## 2019-08-04 MED ORDER — METOPROLOL TARTRATE 25 MG PO TABS
25.0000 mg | ORAL_TABLET | Freq: Two times a day (BID) | ORAL | Status: DC
  Administered 2019-08-04 (×2): 25 mg via ORAL
  Filled 2019-08-04 (×2): qty 1

## 2019-08-04 MED ORDER — SODIUM CHLORIDE 0.9% FLUSH: 3.0000 mL | Freq: Two times a day (BID) | INTRAVENOUS | Status: DC

## 2019-08-04 MED ORDER — NITROGLYCERIN 0.4 MG SL SUBL: 0.4000 mg | SUBLINGUAL_TABLET | SUBLINGUAL | Status: DC | PRN

## 2019-08-04 MED ORDER — ZOLPIDEM TARTRATE 5 MG PO TABS: 5.0000 mg | ORAL_TABLET | Freq: Every evening | ORAL | Status: DC | PRN

## 2019-08-04 MED ORDER — SODIUM CHLORIDE 0.9 % WEIGHT BASED INFUSION
3.0000 mL/kg/h | INTRAVENOUS | Status: AC
  Administered 2019-08-04: 3 mL/kg/h via INTRAVENOUS

## 2019-08-04 NOTE — Progress Notes (Signed)
ANTICOAGULATION CONSULT NOTE - [place] for IV Heparin Indication: chest pain/ACS  Allergies  Allergen Reactions  . Nitroglycerin Other (See Comments)    Pt will not take.  States he was in hospital and after taking second NTG SL pt states "I had to be shocked, I am scared of that medicine and will not take again".   . Lidocaine Other (See Comments)    Syncope post palmer injection; probably vasovagal as no reaction to intraarticular Lidocaine into knee    Patient Measurements: Height: 6\' 4"  (193 cm) Weight: 209 lb 10.5 oz (95.1 kg) IBW/kg (Calculated) : 86.8 Heparin Dosing Weight: 96 kg  Vital Signs: Temp: 98.4 F (36.9 C) (10/29 2002) Temp Source: Oral (10/29 2002) BP: 131/90 (10/29 2002) Pulse Rate: 87 (10/29 2002)  Labs: Recent Labs    08/03/19 1746 08/03/19 2022 08/04/19 1111 08/04/19 1910 08/04/19 1927  HGB 14.9  --   --   --   --   HCT 44.1  --   --   --   --   PLT 161  --   --   --   --   APTT  --   --  25  --  58*  HEPARINUNFRC  --   --  1.14* 1.08*  --   CREATININE 1.68*  --   --  1.56*  --   TROPONINIHS <2 <2  --   --   --     Estimated Creatinine Clearance: 50.2 mL/min (A) (by C-G formula based on SCr of 1.56 mg/dL (H)).   Medical History: Past Medical History:  Diagnosis Date  . Anemia, mild   . CAD (coronary artery disease)    a. 02/2010 NSTEMI/PCI: PROMUS DES to mRCA. b. 06/05/14 Cath/PCI: pLAD 70%--> s/p PCI/DES (Promus DES);  c. 01/2015 Cath/PCI: LM nl, LAD 29m, patent prox stent, LCX patent prox stent, RCA 30p, 18m (3.5x20 Synergy DES). // Myoview 07/2019:  EF 48, atten artifact, no ischemia, intermediate risk (low EF)   . CKD (chronic kidney disease), stage III   . Diastolic dysfunction    a. Echo 6/11: mild LVH, EF 55-60%, GR1DD, Trivial MR, mild LAE. b. echo 06/04/14: EF 55-60%, no WMA, GR1DD, Ao valve mildly calcifed, mild MR, mild LAE  . History of pulmonary embolism   . Hyperlipidemia   . Hypertension    did not  tolerate Lisinopril event at low dose  . Melanoma ([place])    a. 1987. b. 2012  . Myocardial infarction ([place])   . Persistent atrial fibrillation ([place]) 12/03/2018   Dx 11/2018 >> Apixaban started // Echo 12/2018: EF 55-60, mod RVE, normal RVSF, mild LAE, trivial MR, mild TR, mod AoV sclerosis   . Prostate cancer ([place])    Dr Karsten Ro  . Stroke ([place])   . TIA (transient ischemic attack)    a. post cath and PCI on 06/05/2014 - no residual sequelae.  . Vitamin B12 deficiency     Assessment: 73 year old male who was brought to ED by EMS for chest pain. Pharmacy consulted by Cardiology for IV Heparin dosing. Patient was on Apixaban therapy prior to admission. Discussed with patient and he states his last dose of Apixaban was yesterday morning (08/03/19).   Baseline heparin level altered by recent apixaban - dosing off aPTTs for now. Initial aPTT slightly below goal at 58 seconds.  Goal of Therapy:  Heparin level 0.3-0.7 units/ml  APTT 66-102s Monitor platelets by anticoagulation protocol: Yes   Plan:  -  Increase heparin to 1350 units/hr -Recheck aPTT and heparin level in [place], PharmD, BCPS Clinical Pharmacist [phone] Please check AMION for all [place] numbers 08/04/2019

## 2019-08-04 NOTE — Progress Notes (Signed)
ANTICOAGULATION CONSULT NOTE - Initial Consult  Pharmacy Consult for IV Heparin Indication: chest pain/ACS  Allergies  Allergen Reactions  . Nitroglycerin Other (See Comments)    Pt will not take.  States he was in hospital and after taking second NTG SL pt states "I had to be shocked, I am scared of that medicine and will not take again".   . Lidocaine Other (See Comments)    Syncope post palmer injection; probably vasovagal as no reaction to intraarticular Lidocaine into knee    Patient Measurements: Height: 6\' 4"  (193 cm) Weight: 212 lb (96.2 kg) IBW/kg (Calculated) : 86.8 Heparin Dosing Weight: 96 kg  Vital Signs: Temp: 97.7 F (36.5 C) (10/29 0554) Temp Source: Oral (10/29 0554) BP: 144/89 (10/29 0915) Pulse Rate: 70 (10/29 0915)  Labs: Recent Labs    08/03/19 1746 08/03/19 2022  HGB 14.9  --   HCT 44.1  --   PLT 161  --   CREATININE 1.68*  --   TROPONINIHS <2 <2    Estimated Creatinine Clearance: 46.6 mL/min (A) (by C-G formula based on SCr of 1.68 mg/dL (H)).   Medical History: Past Medical History:  Diagnosis Date  . Anemia, mild   . CAD (coronary artery disease)    a. 02/2010 NSTEMI/PCI: PROMUS DES to mRCA. b. 06/05/14 Cath/PCI: pLAD 70%--> s/p PCI/DES (Promus DES);  c. 01/2015 Cath/PCI: LM nl, LAD 6m, patent prox stent, LCX patent prox stent, RCA 30p, 87m (3.5x20 Synergy DES). // Myoview 07/2019:  EF 48, atten artifact, no ischemia, intermediate risk (low EF)   . CKD (chronic kidney disease), stage III   . Diastolic dysfunction    a. Echo 6/11: mild LVH, EF 55-60%, GR1DD, Trivial MR, mild LAE. b. echo 06/04/14: EF 55-60%, no WMA, GR1DD, Ao valve mildly calcifed, mild MR, mild LAE  . History of pulmonary embolism   . Hyperlipidemia   . Hypertension    did not tolerate Lisinopril event at low dose  . Melanoma ([place])    a. 1987. b. 2012  . Myocardial infarction ([place])   . Persistent atrial fibrillation ([place]) 12/03/2018   Dx 11/2018 >> Apixaban started // Echo  12/2018: EF 55-60, mod RVE, normal RVSF, mild LAE, trivial MR, mild TR, mod AoV sclerosis   . Prostate cancer ([place])    Dr Karsten Ro  . Stroke ([place])   . TIA (transient ischemic attack)    a. post cath and PCI on 06/05/2014 - no residual sequelae.  . Vitamin B12 deficiency     Assessment: 73 year old male who was brought to ED by EMS for chest pain. Pharmacy consulted by Cardiology for IV Heparin dosing. Patient was on Apixaban therapy prior to admission. Discussed with patient and he states his last dose of Apixaban was yesterday morning (08/03/19).   Hemoglobin and Hematocrit are within normal limits.  Platelets are 161 - stable based on past labs.  Patient has not had any bleeding signs in urine, stool, or nosebleeds. States he does bleed easy when he nicks himself on something. SCr is elevated at 1.68 but this appears to be baseline for this patient based on prior labs.   Goal of Therapy:  Heparin level 0.3-0.7 units/ml Monitor platelets by anticoagulation protocol: Yes   Plan:  Baseline Heparin level and aPTT due to recent Eliquis.  Since >24 hours from last dose of Eliquis, start IV Heparin 4000 units IV x1, then IV Heparin at 1150 units/hr.  Check a heparin level in 8 hours.  Will monitor daily Heparin level and CBC while on therapy.   Sloan Leiter, PharmD, BCPS, BCCCP Clinical Pharmacist Clinical phone 08/04/2019 until 2:[phone] Please refer to AMION for [place] numbers 08/04/2019,10:31 AM

## 2019-08-04 NOTE — ED Provider Notes (Signed)
[facility] EMERGENCY DEPARTMENT Provider Note   CSN: [id number] Arrival date & time: 08/03/19  1727     History   Chief Complaint Chief Complaint  Patient presents with   Chest Pain    HPI [name] is a 73 y.o. male.      Chest Pain Pain location:  Substernal area Pain quality: burning and pressure   Pain radiates to:  L arm Pain severity:  Moderate Onset quality:  Sudden Duration:  15 minutes Timing:  Intermittent Progression:  Waxing and waning Chronicity:  Recurrent (Patient has a history of coronary artery disease and states the previous chest pain has been more crushing and radiating to his right arm) Context: movement   Relieved by:  Rest Worsened by:  Exertion Ineffective treatments:  Aspirin and rest Associated symptoms: fatigue and shortness of breath   Associated symptoms: no abdominal pain, no back pain, no cough, no diaphoresis, no dizziness, no fever, no lower extremity edema, no near-syncope, no palpitations, no syncope and no vomiting   Risk factors: coronary artery disease, male sex and obesity   Risk factors: no hypertension     Past Medical History:  Diagnosis Date   Anemia, mild    CAD (coronary artery disease)    a. 02/2010 NSTEMI/PCI: PROMUS DES to mRCA. b. 06/05/14 Cath/PCI: pLAD 70%--> s/p PCI/DES (Promus DES);  c. 01/2015 Cath/PCI: LM nl, LAD 25m, patent prox stent, LCX patent prox stent, RCA 30p, 40m (3.5x20 Synergy DES). // Myoview 07/2019:  EF 70, atten artifact, no ischemia, intermediate risk (low EF)    CKD (chronic kidney disease), stage III    Diastolic dysfunction    a. Echo 6/11: mild LVH, EF 55-60%, GR1DD, Trivial MR, mild LAE. b. echo 06/04/14: EF 55-60%, no WMA, GR1DD, Ao valve mildly calcifed, mild MR, mild LAE   History of pulmonary embolism    Hyperlipidemia    Hypertension    did not tolerate Lisinopril event at low dose   Melanoma ([place])    a. 1987. b. 2012   Myocardial infarction ([place])     Persistent atrial fibrillation ([place]) 12/03/2018   Dx 11/2018 >> Apixaban started // Echo 12/2018: EF 55-60, mod RVE, normal RVSF, mild LAE, trivial MR, mild TR, mod AoV sclerosis    Prostate cancer ([place])    Dr Karsten Ro   Stroke [facility])    TIA (transient ischemic attack)    a. post cath and PCI on 06/05/2014 - no residual sequelae.   Vitamin B12 deficiency     Patient Active Problem List   Diagnosis Date Noted   Progressive angina ([place]) 08/04/2019   Gouty arthritis of left great toe 08/02/2019   Persistent atrial fibrillation ([place]), rapid ventricular response [id number]   Umbilical hernia without obstruction or gangrene 09/14/2018   B12 deficiency 09/14/2018   Erectile dysfunction 08/04/2016   Elevated TSH 08/29/2015   S/P coronary artery stent placement, 01/19/15 S/p DES of mid RCA 01/20/2015   Prediabetes 10/03/2014   TIA (transient ischemic attack) 07/31/2014   Cerebral infarction ([place]) 06/05/2014   NSTEMI (non-ST elevated myocardial infarction) ([place]) 06/04/2014   Chronic kidney disease (CKD), stage III (moderate) 06/14/2010   Coronary artery disease involving native coronary artery of native heart without angina pectoris 03/01/2010   TREMOR, ESSENTIAL, RIGHT HAND 04/24/2009   PROSTATE CANCER, HX OF 04/24/2009   Melanoma ([place]) 04/24/2009   Hyperlipidemia LDL goal <70 10/28/2007   Essential hypertension 10/13/2007    Past Surgical History:  Procedure Laterality Date  CHOLECYSTECTOMY     COLONOSCOPY  2006   KNEE ARTHROSCOPY     right   LEFT HEART CATHETERIZATION WITH CORONARY ANGIOGRAM N/A 06/05/2014   Procedure: LEFT HEART CATHETERIZATION WITH CORONARY ANGIOGRAM;  Surgeon: Blane Ohara, MD;  Location[facility] C[facility] LAB;  Service: Cardiovascular;  Laterality: N/A;   LEFT HEART CATHETERIZATION WITH CORONARY ANGIOGRAM N/A 01/19/2015   Procedure: LEFT HEART CATHETERIZATION WITH CORONARY ANGIOGRAM;  Surgeon: Peter M Martinique, MD;  Location: [facility] CATH LAB;  Service:  Cardiovascular;  Laterality: N/A;   MELANOMA EXCISION  2011    L chest   MELANOMA EXCISION  1991   back   mRCA stent  02/2010   PROSTATECTOMY     Dr. Karsten Ro   TONSILLECTOMY          Home Medications    Prior to Admission medications   Medication Sig Start Date End Date Taking? Authorizing Provider  allopurinol (ZYLOPRIM) 100 MG tablet Take 1 tablet (100 mg total) by mouth daily. 08/02/19  Yes Burns, Claudina Lick, MD  aspirin EC 81 MG tablet Take 1 tablet (81 mg total) by mouth daily. 07/26/19  Yes Weaver, Scott T, PA-C  calcium carbonate (TUMS - DOSED IN MG ELEMENTAL CALCIUM) 500 MG chewable tablet Chew 2 tablets by mouth daily as needed for indigestion or heartburn.    Yes [provider]  Cyanocobalamin (VITAMIN B12 PO) Take 1 tablet by mouth every evening.   Yes [provider]  diltiazem (CARDIZEM CD) 240 MG 24 hr capsule Take 1 capsule (240 mg total) by mouth daily. 08/02/19  Yes Weaver, Scott T, PA-C  ezetimibe (ZETIA) 10 MG tablet Take 1 tablet (10 mg total) by mouth daily. 04/19/19 08/04/19 Yes Weaver, Scott T, PA-C  fish oil-omega-3 fatty acids 1000 MG capsule Take 2 g by mouth 2 (two) times daily.     Yes [provider]  Misc Natural Products (TART CHERRY ADVANCED PO) Take 1,000 mg by mouth daily.   Yes [provider]  Potassium Gluconate 2 MEQ TABS Take 1 tablet by mouth daily.   Yes [provider]  predniSONE (DELTASONE) 20 MG tablet Take 2 tablets (40 mg total) by mouth daily with breakfast. 08/02/19  Yes Burns, Claudina Lick, MD  rosuvastatin (CRESTOR) 10 MG tablet Take 1 tablet (10 mg total) by mouth daily. 07/21/19  Yes Weaver, Scott T, PA-C  apixaban (ELIQUIS) 5 MG TABS tablet Take 1 tablet (5 mg total) by mouth 2 (two) times daily. 12/03/18   [name] T, PA-C  metoprolol tartrate (LOPRESSOR) 50 MG tablet Take 50 mg by mouth 2 (two) times daily.    [provider]    Family History Family History  Problem  Relation Age of Onset   Coronary artery disease Mother        S/P CABG, in her 8s   Transient ischemic attack Mother 64   Heart disease Mother    Prostate cancer Father    Hypertension Brother    Diabetes Sister     Social History Social History   Tobacco Use   Smoking status: Never Smoker   Smokeless tobacco: Never Used  Substance Use Topics   Alcohol use: No    Alcohol/week: 0.0 standard drinks   Drug use: No     Allergies   Nitroglycerin and Lidocaine   Review of Systems Review of Systems  Constitutional: Positive for fatigue. Negative for chills, diaphoresis and fever.  HENT: Negative for ear pain and sore throat.  Eyes: Negative for pain and visual disturbance.  Respiratory: Positive for chest tightness and shortness of breath. Negative for cough.   Cardiovascular: Positive for chest pain. Negative for palpitations, syncope and near-syncope.  Gastrointestinal: Negative for abdominal pain and vomiting.  Genitourinary: Negative for dysuria and hematuria.  Musculoskeletal: Negative for arthralgias and back pain.  Skin: Negative for color change and rash.  Neurological: Negative for dizziness, seizures and syncope.  All other systems reviewed and are negative.    Physical Exam Updated Vital Signs BP 129/87    Pulse 84    Temp 97.7 F (36.5 C) (Oral)    Resp 16    Ht 6\' 4"  (1.93 m)    Wt 96.2 kg    SpO2 97%    BMI 25.81 kg/m   Physical Exam Vitals signs and nursing note reviewed.  Constitutional:      General: He is not in acute distress.    Appearance: He is well-developed.  HENT:     Head: Normocephalic and atraumatic.  Eyes:     Conjunctiva/sclera: Conjunctivae normal.  Neck:     Musculoskeletal: Normal range of motion and neck supple.  Cardiovascular:     Rate and Rhythm: Normal rate and regular rhythm.     Heart sounds: No murmur.  Pulmonary:     Effort: Pulmonary effort is normal. No respiratory distress.     Breath sounds: Normal  breath sounds. No decreased breath sounds.  Abdominal:     Palpations: Abdomen is soft.     Tenderness: There is no abdominal tenderness.  Skin:    General: Skin is warm and dry.     Capillary Refill: Capillary refill takes less than 2 seconds.  Neurological:     General: No focal deficit present.     Mental Status: He is alert.      ED Treatments / Results  Labs (all labs ordered are listed, but only abnormal results are displayed) Labs Reviewed  BASIC METABOLIC PANEL - Abnormal; Notable for the following components:      Result Value   Glucose, Bld 134 (*)    Creatinine, Ser 1.68 (*)    GFR calc non Af Amer 39 (*)    GFR calc Af Amer 45 (*)    All other components within normal limits  HEPARIN LEVEL (UNFRACTIONATED) - Abnormal; Notable for the following components:   Heparin Unfractionated 1.14 (*)    All other components within normal limits  SARS CORONAVIRUS 2 BY RT PCR (HOSPITAL ORDER, [place] [facility])  SURGICAL PCR SCREEN  CBC  APTT  HEPARIN LEVEL (UNFRACTIONATED)  HEPARIN LEVEL (UNFRACTIONATED)  CBC  TROPONIN I (HIGH SENSITIVITY)  TROPONIN I (HIGH SENSITIVITY)    EKG EKG Interpretation  Date/Time:  Wednesday August 03 2019 17:30:22 EDT Ventricular Rate:  94 PR Interval:    QRS Duration: 70 QT Interval:  314 QTC Calculation: 392 R Axis:   42 Text Interpretation: Atrial fibrillation Abnormal ECG Abnormal ECG Confirmed by Carmin Muskrat [phone]) on 08/04/2019 7:16:27 AM   Radiology Dg Chest 2 View  Result Date: 08/03/2019 CLINICAL DATA:  Chest pain EXAM: CHEST - 2 VIEW COMPARISON:  January 17, 2015 FINDINGS: The heart size and mediastinal contours are within normal limits. Both lungs are clear. The visualized skeletal structures are unremarkable. IMPRESSION: No acute cardiopulmonary process. Electronically Signed   By: Prudencio Pair M.D.   On: 08/03/2019 18:16    Procedures Procedures (including critical care time)  Medications  Ordered in  ED Medications  sodium chloride flush (NS) 0.9 % injection 3 mL (has no administration in time range)  heparin ADULT infusion 100 units/mL (25000 units/270mL sodium chloride 0.45%) (1,150 Units/hr Intravenous Transfusing/Transfer 08/04/19 1551)  metoprolol tartrate (LOPRESSOR) tablet 25 mg (25 mg Oral Given 08/04/19 1126)  0.9% sodium chloride infusion (0 mL/kg/hr  96.2 kg Intravenous Stopped 08/04/19 1338)    Followed by  0.9% sodium chloride infusion (1 mL/kg/hr  96.2 kg Intravenous Transfusing/Transfer 08/04/19 1551)  heparin bolus via infusion 4,000 Units (4,000 Units Intravenous Bolus from Bag 08/04/19 1126)  aspirin chewable tablet 81 mg (81 mg Oral Given 08/04/19 1231)     Initial Impression / Assessment and Plan / ED Course  I have reviewed the triage vital signs and the nursing notes.  Pertinent labs & imaging results that were available during my care of the patient were reviewed by me and considered in my medical decision making (see chart for details).        Patient is a 73 year old male with history and physical exam as above presents emergency department for evaluation of chest pain.  Notably patient has been in the waiting room prior to me seeing him for greater than 14 hours.  Chest pain as described in HPI above.  Notably patient had a recent nuclear medicine study which demonstrated grossly normal perfusion.  The nuclear medicine study did note a decreased ejection fraction from previous echo however it was noted in the report of the nuclear medicine study that this is a less optimal way of measuring ejection fraction than echocardiogram therefore per his recent cardiology note his ejection fraction is felt to be at baseline.  This was performed 6 days ago.  He has recently undergone some medication changes including stopping amlodipine and metoprolol and starting on diltiazem for control of his atrial fibrillation.  Patient states that he does not have pain at  the time of my initial evaluation.  States that he feels as though he would if he were to try to exert himself to any degree currently.  States that he got aspirin with EMS prior to arrival.  Labs and imaging were obtained via standing order prior to my initial evaluation of the patient as well as an EKG.  Labs demonstrated creatinine at baseline, undetectable high-sensitivity troponin x2, chest x-ray demonstrated no emergent findings, EKG demonstrated atrial fibrillation with a rate of 94 bpm but no findings concerning for ischemia.  At the time of my evaluation of the patient this morning his heart rate was in the 95 to 115 bpm range in atrial fibrillation.  Due to his typical angina symptoms concerning for unstable angina cardiology was consulted.    Patient was seen in conjunction with Dr. Vanita Panda.  Final Clinical Impressions(s) / ED Diagnoses   Final diagnoses:  Unstable angina [facility])    ED Discharge Orders    None       Romona Curls, MD 08/04/19 1726    Carmin Muskrat, MD 08/05/19 [id number]    Carmin Muskrat, MD 08/15/19 1753

## 2019-08-04 NOTE — ED Notes (Signed)
Pt ambulated to bathroom. Upon returning to the room pt reported 7/10 chest pain. Chest pain subsided after 5 minutes of rest.

## 2019-08-04 NOTE — H&P (Addendum)
Cardiology Admission History and Physical:   Patient ID: [name]; MRN: [id number]; DOB: [birth year]   Admission date: 08/03/2019  Primary Care Provider: Binnie Rail, MD Primary Cardiologist: Brian Moores, MD (assigned but has not seen) [name], [facility] 07/26/2019 Primary Electrophysiologist:  None   Chief Complaint:  Chest pain  Patient Profile:   [name] is a 73 y.o. male with a history of NSTEMI 2011 s/p DES RCA, 2015 DES LAD, DES RCA w/ other patent stents 2016, MV ok 07/29/2019 w/ EF 48% and bowel attn but no ischemia, HTN, HLD, CKD III, PE, persistent Afib on Eliquis, prostate CA, CVA/TIA after cath 2015  History of Present Illness:   [name] has been having problems with chest pain. He was having some dyspnea on exertion and fatigue when he saw [name] on 10/20.  Stress test was ordered.  The stress test was without ischemia, probable bowel attenuation inferiorly.  EF 48%, but appears higher.  EF previously normal by echo.  There was concern that metoprolol was causing fatigue.  He was started on diltiazem, and his amlodipine and metoprolol were stopped.  Since then, he has had chest pain with exertion.  He had 2 episodes of chest pain, one when walking up stairs to go to bed, and another one when walking up a slight hill to his house.  Both episodes were relieved by rest.  Both episodes were associated with shortness of breath.  The episode yesterday was associated with bilateral arm pain.  Previously, only his left arm was hurting.  However, yesterday, both arms were hurting.  He did not have nausea, vomiting, or diaphoresis.  He describes the pain as a burning.  He has not had pain since being in the emergency room.  He wonders if he should get up and walk around to see if he will have more pain.  He is completely unaware of the atrial fibrillation.  He never gets palpitations.  He never feels lightheaded or dizzy.  He is concerned about having another cath because he  had a stroke after a cath in 2015.  His last cath was in 2016, going in through his wrist.  He stated he had pain with this and was uncomfortable even afterwards, requiring pain medications.  He mentions that he has been having problems with being overly emotional.  He states that he will get teary-eyed for no reason.  He took his Eliquis yesterday morning, but has not had it since.   Past Medical History:  Diagnosis Date   Anemia, mild    CAD (coronary artery disease)    a. 02/2010 NSTEMI/PCI: PROMUS DES to mRCA. b. 06/05/14 Cath/PCI: pLAD 70%--> s/p PCI/DES (Promus DES);  c. 01/2015 Cath/PCI: LM nl, LAD 70m, patent prox stent, LCX patent prox stent, RCA 30p, 10m (3.5x20 Synergy DES). // Myoview 07/2019:  EF 18, atten artifact, no ischemia, intermediate risk (low EF)    CKD (chronic kidney disease), stage III    Diastolic dysfunction    a. Echo 6/11: mild LVH, EF 55-60%, GR1DD, Trivial MR, mild LAE. b. echo 06/04/14: EF 55-60%, no WMA, GR1DD, Ao valve mildly calcifed, mild MR, mild LAE   History of pulmonary embolism    Hyperlipidemia    Hypertension    did not tolerate Lisinopril event at low dose   Melanoma ([place])    a. 1987. b. 2012   Myocardial infarction ([name])    Persistent atrial fibrillation ([name]) 12/03/2018   Dx 11/2018 >> Apixaban  started // Echo 12/2018: EF 55-60, mod RVE, normal RVSF, mild LAE, trivial MR, mild TR, mod AoV sclerosis    Prostate cancer ([name])    Dr [name]   Stroke [name])    TIA (transient ischemic attack)    a. post cath and PCI on 06/05/2014 - no residual sequelae.   Vitamin B12 deficiency     Past Surgical History:  Procedure Laterality Date   CHOLECYSTECTOMY     COLONOSCOPY  2006   KNEE ARTHROSCOPY     right   LEFT HEART CATHETERIZATION WITH CORONARY ANGIOGRAM N/A 06/05/2014   Procedure: LEFT HEART CATHETERIZATION WITH CORONARY ANGIOGRAM;  Surgeon: Brian Ohara, MD;  Location[facility] CATH LAB;  Service: Cardiovascular;  Laterality: N/A;    LEFT HEART CATHETERIZATION WITH CORONARY ANGIOGRAM N/A 01/19/2015   Procedure: LEFT HEART CATHETERIZATION WITH CORONARY ANGIOGRAM;  Surgeon: Brian M Martinique, MD;  Location: [facility] CATH LAB;  Service: Cardiovascular;  Laterality: N/A;   MELANOMA EXCISION  2011    L chest   MELANOMA EXCISION  1991   back   mRCA stent  02/2010   PROSTATECTOMY     Dr. Karsten [name]   TONSILLECTOMY       Medications Prior to Admission: Prior to Admission medications   Medication Sig Start Date End Date Taking? Authorizing Provider  allopurinol (ZYLOPRIM) 100 MG tablet Take 1 tablet (100 mg total) by mouth daily. 08/02/19   Brian Rail, MD  apixaban (ELIQUIS) 5 MG TABS tablet Take 1 tablet (5 mg total) by mouth 2 (two) times daily. 12/03/18   [name]  aspirin EC 81 MG tablet Take 1 tablet (81 mg total) by mouth daily. 07/26/19   [name] [name], [name]  calcium carbonate (TUMS - DOSED IN MG ELEMENTAL CALCIUM) 500 MG chewable tablet Chew 2 tablets by mouth daily as needed for indigestion or heartburn.     [provider]  Cyanocobalamin (VITAMIN B12 PO) Take 1 tablet by mouth every evening.    [provider]  diltiazem (CARDIZEM CD) 240 MG 24 hr capsule Take 1 capsule (240 mg total) by mouth daily. 08/02/19   [name] [name], [name]  ezetimibe (ZETIA) 10 MG tablet Take 1 tablet (10 mg total) by mouth daily. 04/19/19 07/18/19  [name] [name], [name]  fish oil-omega-3 fatty acids 1000 MG capsule Take 2 g by mouth 2 (two) times daily.      [provider]  Misc Natural Products (TART CHERRY ADVANCED PO) Take 1,000 mg by mouth daily.    [provider]  nitroGLYCERIN (NITROSTAT) 0.4 MG SL tablet Place 1 tablet (0.4 mg total) under the tongue every 5 (five) minutes as needed for chest pain. 01/15/15   Brian Limes, MD  Potassium Gluconate 2 MEQ TABS Take 1 tablet by mouth daily.    [provider]  predniSONE (DELTASONE) 20 MG tablet Take 2 tablets (40 mg total) by  mouth daily with breakfast. 08/02/19   [name], Claudina Lick, MD  rosuvastatin (CRESTOR) 10 MG tablet Take 1 tablet (10 mg total) by mouth daily. 07/21/19   [name] [name], [name]     Allergies:    Allergies  Allergen Reactions   Nitroglycerin Other (See Comments)    Pt will not take.  States he was in [name] and after taking second NTG SL pt states "I had to be shocked, I am scared of that medicine and will not take again".    Lidocaine Other (See Comments)  Syncope post palmer injection; probably vasovagal as no reaction to intraarticular Lidocaine into knee    Social History:   Social History   Socioeconomic History   Marital status: Married    Spouse name: Not on file   Number of children: Not on file   Years of education: Not on file   Highest education level: Not on file  Occupational History   Not on file  Social Needs   Financial resource strain: Not on file   Food insecurity    Worry: Not on file    Inability: Not on file   Transportation needs    Medical: Not on file    Non-medical: Not on file  Tobacco Use   Smoking status: Never Smoker   Smokeless tobacco: Never Used  Substance and Sexual Activity   Alcohol use: No    Alcohol/week: 0.0 standard drinks   Drug use: No   Sexual activity: Not on file  Lifestyle   Physical activity    Days per week: Not on file    Minutes per session: Not on file   Stress: Not on file  Relationships   Social connections    Talks on phone: Not on file    Gets together: Not on file    Attends religious service: Not on file    Active member of club or organization: Not on file    Attends meetings of clubs or organizations: Not on file    Relationship status: Not on file   Intimate partner violence    Fear of current or ex partner: Not on file    Emotionally abused: Not on file    Physically abused: Not on file    Forced sexual activity: Not on file  Other Topics Concern   Not on file  Social History  Narrative   Not on file    Family History:  The patient's family history includes Coronary artery disease in his mother; Diabetes in his sister; Heart disease in his mother; Hypertension in his brother; Prostate cancer in his father; Transient ischemic attack (age of onset: 76) in his mother.   The patient He indicated that his mother is deceased. He indicated that his father is deceased. He indicated that only one of his two sisters is alive. He indicated that his brother is alive. He indicated that his maternal grandmother is deceased. He indicated that his maternal grandfather is deceased. He indicated that his paternal grandmother is deceased. He indicated that his paternal grandfather is deceased. He indicated that his maternal aunt is alive.  ROS:  Please see the history of present illness.  All other ROS reviewed and negative.     Physical Exam/Data:   Vitals:   08/04/19 0830 08/04/19 0845 08/04/19 0900 08/04/19 0915  BP: (!) 142/94 (!) 130/91 (!) 132/91 (!) 144/89  Pulse: 86 85 88 70  Resp: 19 19 19 18   Temp:      TempSrc:      SpO2: 97% 94% 95% 96%  Weight:      Height:       No intake or output data in the 24 hours ending 08/04/19 1050 Filed Weights   08/04/19 0806  Weight: 96.2 kg   Body mass index is 25.81 kg/m.  General:  Well nourished, well developed, male in no acute distress HEENT: normal Lymph: no adenopathy Neck:  JVD not elevated Endocrine:  No thryomegaly Vascular: No carotid bruits; 4/4 extremity pulses 2+ bilaterally  Cardiac:  normal S1, S2;  RRR; no murmur, no rub or gallop  Lungs:  clear to auscultation bilaterally, no wheezing, rhonchi or rales  Abd: soft, nontender, no hepatomegaly  Ext: No edema Musculoskeletal:  No deformities, BUE and BLE strength normal and equal Skin: warm and dry  Neuro:  CNs 2-12 intact, no focal abnormalities noted Psych:  Normal affect    EKG:  The ECG was personally reviewed: 10/28 ECG is atrial fib, heart rate 94,  no acute ischemic changes, no significant change from 07/26/2019  Relevant CV Studies:  ECHO: 12/08/2018  1. The left ventricle has normal systolic function, with an ejection fraction of 55-60%. The cavity size was normal. Left ventricular diastology could not be evaluated secondary to atrial fibrillation.  2. The right ventricle has normal systolic function. The cavity was moderately enlarged. There is no increase in right ventricular wall thickness.  3. Left atrial size was mildly dilated.  4. The mitral valve is normal in structure with trivial regurgitation.  5. The tricuspid valve is normal in structure with mild regurgitation.  6. The aortic valve is tricuspid Moderate sclerosis of the aortic valve.  7. The pulmonic valve was normal in structure.  CATH: 01/19/2015 Coronary angiography: Coronary dominance: right  Left mainstem: Normal  Left anterior descending (LAD): The stent in the proximal LAD is widely patent. There is a 50% stenosis in the mid vessel.   Left circumflex (LCx): There is a stent in the proximal LCx and it is widely patent. No obstructive disease.  Right coronary artery (RCA): The RCA is a large dominant vessel. It is tortuous. There is diffuse 30% disease in the proximal vessel. There is a focal 90% stenosis in the mid vessel.   Left ventriculography: Not done due to CKD.   PCI Note:  Following the diagnostic procedure, the decision was made to proceed with PCI of the RCA.  Weight-based bivalirudin was given for anticoagulation. Once a therapeutic ACT was achieved, a 6 [place] FR4 guide catheter was inserted.  A prowater coronary guidewire was used to cross the lesion.  The lesion was predilated with a 2.5 mm balloon. At this point we were unable to cross the lesion with a stent due to tortuosity of the proximal vessel and calcification. A Guideliner catheter was advanced into the mid RCA over a balloon.   The lesion was then stented with a 3.5 x 20 mm Synergy  stent.  The stent was postdilated with a 4.0 mm noncompliant balloon.  Following PCI, there was 0% residual stenosis and TIMI-3 flow. Final angiography confirmed an excellent result. The patient tolerated the procedure well. There were no immediate procedural complications. A TR band was used for radial hemostasis. The patient was transferred to the post catheterization recovery area for further monitoring.  PCI Data: Vessel - RCA/Segment - mid  Percent Stenosis (pre)  90%  TIMI-flow 3 Stent 3.5 x 20 mm Synergy post dilated to 4.0.  Percent Stenosis (post) 0% TIMI-flow (post) 3  Final Conclusions:   1. Single vessel obstructive CAD. Stents in the proximal LAD and LCx are still patent. Progressive stenosis in the mid RCA. 2. Successful Stenting of the mid RCA with a DES  MYOVIEW: 07/29/2019  The left ventricular ejection fraction is mildly decreased (45-54%).  Nuclear stress EF: 48%.  No [name] wave inversion was noted during stress.  There was no ST segment deviation noted during stress.  Defect 1: There is a small defect of moderate severity present in the basal inferior and mid inferior  location.  This is an intermediate risk study.   Small size, moderate intensity basal to mid inferior perfusion defect that is seen in the rest and stress supine images, but resolves in the stress upright images, this suggests bowel attenuation artifact. LVEF 48% with mild inferior hypokinesis - however, visually, the LVEF appears higher. Overall this is an intermediate risk study, but no significant reversible ischemia is suspected.    Laboratory Data:  Chemistry Recent Labs  Lab 08/03/19 1746  NA 138  K 4.7  CL 102  CO2 26  GLUCOSE 134*  BUN 18  CREATININE 1.68*  CALCIUM 10.2  GFRNONAA 39*  GFRAA 45*  ANIONGAP 10    No results for input(s): PROT, ALBUMIN, AST, ALT, ALKPHOS, BILITOT in the last 168 hours. Hematology Recent Labs  Lab 08/03/19 1746  WBC 8.6  RBC 5.32  HGB 14.9    HCT 44.1  MCV 82.9  MCH 28.0  MCHC 33.8  RDW 13.6  PLT 161   Cardiac Enzymes  High Sensitivity Troponin:   Recent Labs  Lab 08/03/19 1746 08/03/19 2022  TROPONINIHS <2 <2     Lab Results  Component Value Date   CHOL 168 07/20/2019   HDL 37 (L) 07/20/2019   LDLCALC 106 (H) 07/20/2019   LDLDIRECT 68.8 07/20/2012   TRIG 140 07/20/2019   CHOLHDL 4.5 07/20/2019   Lab Results  Component Value Date   TSH 4.90 (H) 12/22/2018   Lab Results  Component Value Date   HGBA1C 5.7 09/14/2018     Radiology/Studies:  Dg Chest 2 View  Result Date: 08/03/2019 CLINICAL DATA:  Chest pain EXAM: CHEST - 2 VIEW COMPARISON:  January 17, 2015 FINDINGS: The heart size and mediastinal contours are within normal limits. Both lungs are clear. The visualized skeletal structures are unremarkable. IMPRESSION: No acute cardiopulmonary process. Electronically Signed   By: Prudencio Pair M.D.   On: 08/03/2019 18:16    Assessment and Plan:   1.  Progressive angina: -He was having some dyspnea on exertion prior to the stress test but ever since the stress test was completed, he has pain with exertion. -I discussed cardiac catheterization with the patient as a definitive answer.  This uses less dye than a cardiac CT and he is aware of the risk as he had stroke/TIA after 1 cath and had some pain after another cath. - The risks and benefits of a cardiac catheterization including, but not limited to, death, stroke, MI, kidney damage and bleeding were discussed with the patient who indicates understanding and agrees to proceed.  -Start heparin, nitrates not currently needed because he is pain-free -Continue aspirin, discuss restart beta-blocker at decreased dose  2.  Hyperlipidemia: -Goal LDL is less than 70, he is above that -After his labs were reviewed, he was told to continue Zetia but Crestor 10 mg daily was added to his medication regimen. -He was reluctant to take a statin because of previous memory  issues -Follow-up in 3 months as directed  3.  Hypertension -Blood pressure is elevated right now, but he has not had his usual blood pressure medications -Restart home BP meds and restart beta-blocker  4.  Persistent atrial fibrillation, RVR -Prior to stopping beta-blocker, his heart rate was better controlled -She was previously on metoprolol 50 mg twice daily -I will restart metoprolol at 25 mg twice daily and see how tolerated -Of note, his fatigue had not really improved off the beta-blocker, it is possibly from the atrial fib -Recheck echocardiogram  to evaluate atrial size, may need to try to restore sinus rhythm  5.  Chronic anticoagulation: -He has been compliant with Eliquis, but missed last night's dose and this morning's dose because of being in the [name] -Start heparin, and restart Eliquis when no further invasive work-up is needed  5.  CKD stage III -His BUN and creatinine are at baseline -I explained that we would hydrate him prior to any procedure requiring dye -I explained that we would use the procedure which uses the least amount of dye to try and minimize the damage to his kidneys  Otherwise, continue home meds Principal Problem:   Progressive angina (HCC) Active Problems:   Hyperlipidemia LDL goal <70   Essential hypertension   Persistent atrial fibrillation ([place]), rapid ventricular response  For questions or updates, please contact [facility] Please consult [email or web address] for contact info under Cardiology/STEMI.    SignedRosaria Ferries, [name]  08/04/2019 10:50 AM

## 2019-08-04 NOTE — Plan of Care (Signed)

## 2019-08-05 ENCOUNTER — Inpatient Hospital Stay (HOSPITAL_COMMUNITY): Payer: Medicare HMO

## 2019-08-05 ENCOUNTER — Encounter (HOSPITAL_COMMUNITY)
Admission: EM | Disposition: A | Discharge status: Home / Self Care | Payer: Self-pay | Source: Home / Self Care | Attending: Cardiology

## 2019-08-05 DIAGNOSIS — I340 Nonrheumatic mitral (valve) insufficiency: Secondary | ICD-10-CM

## 2019-08-05 DIAGNOSIS — I4891 Unspecified atrial fibrillation: Secondary | ICD-10-CM | POA: Diagnosis not present

## 2019-08-05 DIAGNOSIS — I10 Essential (primary) hypertension: Secondary | ICD-10-CM | POA: Diagnosis not present

## 2019-08-05 DIAGNOSIS — I361 Nonrheumatic tricuspid (valve) insufficiency: Secondary | ICD-10-CM | POA: Diagnosis not present

## 2019-08-05 DIAGNOSIS — I25110 Atherosclerotic heart disease of native coronary artery with unstable angina pectoris: Secondary | ICD-10-CM | POA: Diagnosis not present

## 2019-08-05 DIAGNOSIS — E7800 Pure hypercholesterolemia, unspecified: Secondary | ICD-10-CM

## 2019-08-05 DIAGNOSIS — I200 Unstable angina: Secondary | ICD-10-CM | POA: Diagnosis not present

## 2019-08-05 HISTORY — PX: CORONARY STENT INTERVENTION: CATH118234

## 2019-08-05 HISTORY — PX: LEFT HEART CATH AND CORONARY ANGIOGRAPHY: CATH118249

## 2019-08-05 LAB — BASIC METABOLIC PANEL
Anion gap: 10 (ref 5–15)
BUN: 24 mg/dL — ABNORMAL HIGH (ref 8–23)
CO2: 23 mmol/L (ref 22–32)
Calcium: 8.7 mg/dL — ABNORMAL LOW (ref 8.9–10.3)
Chloride: 106 mmol/L (ref 98–111)
Creatinine, Ser: 1.61 mg/dL — ABNORMAL HIGH (ref 0.61–1.24)
GFR calc Af Amer: 48 mL/min — ABNORMAL LOW (ref >60)
GFR calc non Af Amer: 41 mL/min — ABNORMAL LOW (ref >60)
Glucose, Bld: 84 mg/dL (ref 70–99)
Potassium: 4.1 mmol/L (ref 3.5–5.1)
Sodium: 139 mmol/L (ref 135–145)

## 2019-08-05 LAB — CBC
HCT: 38.7 % — ABNORMAL LOW (ref 39.0–52.0)
Hemoglobin: 13.1 g/dL (ref 13.0–17.0)
MCH: 28.1 pg (ref 26.0–34.0)
MCHC: 33.9 g/dL (ref 30.0–36.0)
MCV: 82.9 fL (ref 80.0–100.0)
Platelets: 128 10*3/uL — ABNORMAL LOW (ref 150–400)
RBC: 4.67 MIL/uL (ref 4.22–5.81)
RDW: 13.9 % (ref 11.5–15.5)
WBC: 4.8 10*3/uL (ref 4.0–10.5)
nRBC: 0 % (ref 0.0–0.2)

## 2019-08-05 LAB — POCT ACTIVATED CLOTTING TIME: Activated Clotting Time: 450 seconds

## 2019-08-05 LAB — ECHOCARDIOGRAM COMPLETE
Height: 76 in
Weight: 3354.52 oz

## 2019-08-05 LAB — GLUCOSE, CAPILLARY: Glucose-Capillary: 89 mg/dL (ref 70–99)

## 2019-08-05 LAB — APTT: aPTT: 81 seconds — ABNORMAL HIGH (ref 24–36)

## 2019-08-05 LAB — HEPARIN LEVEL (UNFRACTIONATED): Heparin Unfractionated: 1 IU/mL — ABNORMAL HIGH (ref 0.30–0.70)

## 2019-08-05 SURGERY — LEFT HEART CATH AND CORONARY ANGIOGRAPHY
Anesthesia: LOCAL

## 2019-08-05 MED ORDER — ALLOPURINOL 100 MG PO TABS
100.0000 mg | ORAL_TABLET | Freq: Every day | ORAL | Status: DC
  Administered 2019-08-06: 100 mg via ORAL
  Filled 2019-08-05: qty 1

## 2019-08-05 MED ORDER — ACETAMINOPHEN 325 MG PO TABS: 650.0000 mg | ORAL_TABLET | ORAL | Status: DC | PRN

## 2019-08-05 MED ORDER — HYDRALAZINE HCL 20 MG/ML IJ SOLN: 10.0000 mg | INTRAMUSCULAR | Status: AC | PRN

## 2019-08-05 MED ORDER — FENTANYL CITRATE (PF) 100 MCG/2ML IJ SOLN
INTRAMUSCULAR | Status: DC | PRN
  Administered 2019-08-05: 25 ug via INTRAVENOUS

## 2019-08-05 MED ORDER — ASPIRIN 81 MG PO CHEW
81.0000 mg | CHEWABLE_TABLET | Freq: Every day | ORAL | Status: DC
  Administered 2019-08-06: 81 mg via ORAL
  Filled 2019-08-05: qty 1

## 2019-08-05 MED ORDER — SODIUM CHLORIDE 0.9% FLUSH
3.0000 mL | Freq: Two times a day (BID) | INTRAVENOUS | Status: DC
  Administered 2019-08-05 – 2019-08-06 (×2): 3 mL via INTRAVENOUS

## 2019-08-05 MED ORDER — SODIUM CHLORIDE 0.9 % WEIGHT BASED INFUSION
3.0000 mL/kg/h | INTRAVENOUS | Status: DC
  Administered 2019-08-05: 3 mL/kg/h via INTRAVENOUS

## 2019-08-05 MED ORDER — HEPARIN (PORCINE) IN NACL 1000-0.9 UT/500ML-% IV SOLN
INTRAVENOUS | Status: DC | PRN
  Administered 2019-08-05 (×2): 500 mL

## 2019-08-05 MED ORDER — SODIUM CHLORIDE 0.9% FLUSH: 3.0000 mL | INTRAVENOUS | Status: DC | PRN

## 2019-08-05 MED ORDER — VERAPAMIL HCL 2.5 MG/ML IV SOLN
INTRAVENOUS | Status: AC
  Filled 2019-08-05: qty 2

## 2019-08-05 MED ORDER — SODIUM CHLORIDE 0.9 % IV SOLN: 250.0000 mL | INTRAVENOUS | Status: DC | PRN

## 2019-08-05 MED ORDER — IOHEXOL 350 MG/ML SOLN
INTRAVENOUS | Status: DC | PRN
  Administered 2019-08-05: 70 mL

## 2019-08-05 MED ORDER — LIDOCAINE HCL (PF) 1 % IJ SOLN
INTRAMUSCULAR | Status: DC | PRN
  Administered 2019-08-05: 2 mL

## 2019-08-05 MED ORDER — HEPARIN SODIUM (PORCINE) 1000 UNIT/ML IJ SOLN
INTRAMUSCULAR | Status: AC
  Filled 2019-08-05: qty 1

## 2019-08-05 MED ORDER — LABETALOL HCL 5 MG/ML IV SOLN: 10.0000 mg | INTRAVENOUS | Status: AC | PRN

## 2019-08-05 MED ORDER — CLOPIDOGREL BISULFATE 300 MG PO TABS
ORAL_TABLET | ORAL | Status: DC | PRN
  Administered 2019-08-05: 600 mg via ORAL

## 2019-08-05 MED ORDER — POTASSIUM GLUCONATE 2 MEQ PO TABS: 1.0000 | ORAL_TABLET | Freq: Every day | ORAL | Status: DC

## 2019-08-05 MED ORDER — METOPROLOL TARTRATE 50 MG PO TABS
50.0000 mg | ORAL_TABLET | Freq: Two times a day (BID) | ORAL | Status: DC
  Administered 2019-08-05 – 2019-08-06 (×3): 50 mg via ORAL
  Filled 2019-08-05 (×3): qty 1

## 2019-08-05 MED ORDER — FENTANYL CITRATE (PF) 100 MCG/2ML IJ SOLN
INTRAMUSCULAR | Status: AC
  Filled 2019-08-05: qty 2

## 2019-08-05 MED ORDER — ONDANSETRON HCL 4 MG/2ML IJ SOLN: 4.0000 mg | Freq: Four times a day (QID) | INTRAMUSCULAR | Status: DC | PRN

## 2019-08-05 MED ORDER — NITROGLYCERIN 1 MG/10 ML FOR IR/CATH LAB
INTRA_ARTERIAL | Status: DC | PRN
  Administered 2019-08-05 (×2): 200 ug via INTRACORONARY
  Administered 2019-08-05: 400 ug via INTRA_ARTERIAL

## 2019-08-05 MED ORDER — MIDAZOLAM HCL 2 MG/2ML IJ SOLN
INTRAMUSCULAR | Status: DC | PRN
  Administered 2019-08-05: 2 mg via INTRAVENOUS

## 2019-08-05 MED ORDER — FAMOTIDINE IN NACL 20-0.9 MG/50ML-% IV SOLN
INTRAVENOUS | Status: AC
  Filled 2019-08-05: qty 50

## 2019-08-05 MED ORDER — VERAPAMIL HCL 2.5 MG/ML IV SOLN
INTRAVENOUS | Status: DC | PRN
  Administered 2019-08-05: 10 mL via INTRA_ARTERIAL

## 2019-08-05 MED ORDER — HEPARIN SODIUM (PORCINE) 1000 UNIT/ML IJ SOLN
INTRAMUSCULAR | Status: DC | PRN
  Administered 2019-08-05: 5000 [IU] via INTRAVENOUS
  Administered 2019-08-05: 7000 [IU] via INTRAVENOUS

## 2019-08-05 MED ORDER — SODIUM CHLORIDE 0.9 % IV SOLN: INTRAVENOUS | Status: AC

## 2019-08-05 MED ORDER — EZETIMIBE 10 MG PO TABS
10.0000 mg | ORAL_TABLET | Freq: Every day | ORAL | Status: DC
  Administered 2019-08-06: 10 mg via ORAL
  Filled 2019-08-05: qty 1

## 2019-08-05 MED ORDER — FAMOTIDINE IN NACL 20-0.9 MG/50ML-% IV SOLN
INTRAVENOUS | Status: AC | PRN
  Administered 2019-08-05: 20 mg via INTRAVENOUS

## 2019-08-05 MED ORDER — NITROGLYCERIN 1 MG/10 ML FOR IR/CATH LAB
INTRA_ARTERIAL | Status: AC
  Filled 2019-08-05: qty 10

## 2019-08-05 MED ORDER — LIDOCAINE HCL (PF) 1 % IJ SOLN
INTRAMUSCULAR | Status: AC
  Filled 2019-08-05: qty 30

## 2019-08-05 MED ORDER — ASPIRIN 81 MG PO CHEW
81.0000 mg | CHEWABLE_TABLET | ORAL | Status: AC
  Administered 2019-08-05: 81 mg via ORAL
  Filled 2019-08-05: qty 1

## 2019-08-05 MED ORDER — DILTIAZEM HCL ER COATED BEADS 240 MG PO CP24
240.0000 mg | ORAL_CAPSULE | Freq: Every day | ORAL | Status: DC
  Administered 2019-08-06: 240 mg via ORAL
  Filled 2019-08-05: qty 1

## 2019-08-05 MED ORDER — ROSUVASTATIN CALCIUM 5 MG PO TABS
10.0000 mg | ORAL_TABLET | Freq: Every day | ORAL | Status: DC
  Administered 2019-08-06: 10 mg via ORAL
  Filled 2019-08-05: qty 2

## 2019-08-05 MED ORDER — SODIUM CHLORIDE 0.9 % IV SOLN
INTRAVENOUS | Status: AC | PRN
  Administered 2019-08-05: 250 mL via INTRAVENOUS

## 2019-08-05 MED ORDER — CLOPIDOGREL BISULFATE 300 MG PO TABS
ORAL_TABLET | ORAL | Status: AC
  Filled 2019-08-05: qty 2

## 2019-08-05 MED ORDER — CLOPIDOGREL BISULFATE 75 MG PO TABS
75.0000 mg | ORAL_TABLET | Freq: Every day | ORAL | Status: DC
  Administered 2019-08-06: 75 mg via ORAL
  Filled 2019-08-05: qty 1

## 2019-08-05 MED ORDER — SODIUM CHLORIDE 0.9% FLUSH: 3.0000 mL | Freq: Two times a day (BID) | INTRAVENOUS | Status: DC

## 2019-08-05 MED ORDER — HEPARIN (PORCINE) IN NACL 1000-0.9 UT/500ML-% IV SOLN
INTRAVENOUS | Status: AC
  Filled 2019-08-05: qty 1000

## 2019-08-05 MED ORDER — SODIUM CHLORIDE 0.9 % WEIGHT BASED INFUSION: 1.0000 mL/kg/h | INTRAVENOUS | Status: DC

## 2019-08-05 MED ORDER — MIDAZOLAM HCL 2 MG/2ML IJ SOLN
INTRAMUSCULAR | Status: AC
  Filled 2019-08-05: qty 2

## 2019-08-05 SURGICAL SUPPLY — 17 items
BALLN EUPHORA RX 2.0X12 (BALLOONS) ×2
BALLN SAPPHIRE ~~LOC~~ 2.75X15 (BALLOONS) ×1 IMPLANT
BALLOON EUPHORA RX 2.0X12 (BALLOONS) IMPLANT
CATH 5FR JL3.5 JR4 ANG PIG MP (CATHETERS) ×1 IMPLANT
CATH LAUNCHER 6FR EBU 3 (CATHETERS) ×1 IMPLANT
DEVICE RAD COMP TR BAND LRG (VASCULAR PRODUCTS) ×1 IMPLANT
GLIDESHEATH SLEND SS 6F .021 (SHEATH) ×2 IMPLANT
GUIDEWIRE INQWIRE 1.5J.035X260 (WIRE) IMPLANT
INQWIRE 1.5J .035X260CM (WIRE) ×2
KIT ENCORE 26 ADVANTAGE (KITS) ×1 IMPLANT
KIT HEART LEFT (KITS) ×2 IMPLANT
KIT HEMO VALVE WATCHDOG (MISCELLANEOUS) ×1 IMPLANT
PACK CARDIAC CATHETERIZATION (CUSTOM PROCEDURE TRAY) ×2 IMPLANT
STENT SYNERGY DES 2.5X24 (Permanent Stent) ×1 IMPLANT
TRANSDUCER W/STOPCOCK (MISCELLANEOUS) ×2 IMPLANT
TUBING CIL FLEX 10 FLL-RA (TUBING) ×2 IMPLANT
WIRE ASAHI PROWATER 180CM (WIRE) ×1 IMPLANT

## 2019-08-05 NOTE — Progress Notes (Signed)
ANTICOAGULATION CONSULT NOTE - [facility] for IV Heparin Indication: chest pain/ACS  Allergies  Allergen Reactions  . Nitroglycerin Other (See Comments)    Pt will not take.  States he was in hospital and after taking second NTG SL pt states "I had to be shocked, I am scared of that medicine and will not take again".   . Lidocaine Other (See Comments)    Syncope post palmer injection; probably vasovagal as no reaction to intraarticular Lidocaine into knee    Patient Measurements: Height: 6\' 4"  (193 cm) Weight: 209 lb 10.5 oz (95.1 kg) IBW/kg (Calculated) : 86.8 Heparin Dosing Weight: 96 kg  Vital Signs: Temp: 98 F (36.7 C) (10/30 0700) Temp Source: Oral (10/30 0700) BP: 147/110 (10/30 0700) Pulse Rate: 92 (10/30 0700)  Labs: Recent Labs    08/03/19 1746 08/03/19 2022 08/04/19 1111 08/04/19 1910 08/04/19 1927 08/05/19 0654  HGB 14.9  --   --   --   --  13.1  HCT 44.1  --   --   --   --  38.7*  PLT 161  --   --   --   --  128*  APTT  --   --  25  --  58* 81*  HEPARINUNFRC  --   --  1.14* 1.08*  --  1.00*  CREATININE 1.68*  --   --  1.56*  --  1.61*  TROPONINIHS <2 <2  --   --   --   --     Estimated Creatinine Clearance: 48.7 mL/min (A) (by C-G formula based on SCr of 1.61 mg/dL (H)).   Medical History: Past Medical History:  Diagnosis Date  . Anemia, mild   . CAD (coronary artery disease)    a. 02/2010 NSTEMI/PCI: PROMUS DES to mRCA. b. 06/05/14 Cath/PCI: pLAD 70%--> s/p PCI/DES (Promus DES);  c. 01/2015 Cath/PCI: LM nl, LAD 42m, patent prox stent, LCX patent prox stent, RCA 30p, 12m (3.5x20 Synergy DES). // Myoview 07/2019:  EF 48, atten artifact, no ischemia, intermediate risk (low EF)   . CKD (chronic kidney disease), stage III   . Diastolic dysfunction    a. Echo 6/11: mild LVH, EF 55-60%, GR1DD, Trivial MR, mild LAE. b. echo 06/04/14: EF 55-60%, no WMA, GR1DD, Ao valve mildly calcifed, mild MR, mild LAE  . History of pulmonary embolism    . Hyperlipidemia   . Hypertension    did not tolerate Lisinopril event at low dose  . Melanoma ([place])    a. 1987. b. 2012  . Myocardial infarction ([place])   . Persistent atrial fibrillation ([place]) 12/03/2018   Dx 11/2018 >> Apixaban started // Echo 12/2018: EF 55-60, mod RVE, normal RVSF, mild LAE, trivial MR, mild TR, mod AoV sclerosis   . Prostate cancer ([place])    Dr Karsten Ro  . Stroke ([place])   . TIA (transient ischemic attack)    a. post cath and PCI on 06/05/2014 - no residual sequelae.  . Vitamin B12 deficiency     Assessment: 73 year old male who was brought to ED by EMS for chest pain. Pharmacy consulted by Cardiology for IV Heparin dosing. Patient was on Apixaban therapy prior to admission. Discussed with patient and he states his last dose of Apixaban was 08/03/19.   Heparin level above goal this morning, as expected given recent apixaban dose. Aptt within normal range. No bleeding issues noted.   Goal of Therapy:  Heparin level 0.3-0.7 units/ml  APTT 66-102s  Monitor platelets by anticoagulation protocol: Yes   Plan:  Continue heparin to 1350 units/hr For cath today  Erin Hearing PharmD., BCPS Clinical Pharmacist 08/05/2019 9:29 AM

## 2019-08-05 NOTE — H&P (View-Only) (Signed)
Progress Note  Patient Name: [name] Date of Encounter: 08/05/2019  Primary Cardiologist: Mertie Moores, MD   Subjective   Had some mild chest discomfort overnight. Tele with afib with RVR  Inpatient Medications    Scheduled Meds: . metoprolol tartrate  25 mg Oral BID  . sodium chloride flush  3 mL Intravenous Once  . sodium chloride flush  3 mL Intravenous Q12H  . sodium chloride flush  3 mL Intravenous Q12H   Continuous Infusions: . sodium chloride    . sodium chloride    . sodium chloride 1 mL/kg/hr (08/04/19 2206)  . sodium chloride    . heparin 1,350 Units/hr (08/04/19 2206)   PRN Meds: sodium chloride, sodium chloride, acetaminophen, ALPRAZolam, nitroGLYCERIN, ondansetron (ZOFRAN) IV, sodium chloride flush, sodium chloride flush, zolpidem   Vital Signs    Vitals:   08/04/19 2002 08/04/19 2321 08/05/19 0315 08/05/19 0700  BP: 131/90 (!) 127/98 123/76 (!) 147/110  Pulse: 87 87 90 92  Resp:   19 19  Temp: 98.4 F (36.9 C) 98.5 F (36.9 C) 97.9 F (36.6 C) 98 F (36.7 C)  TempSrc: Oral Oral Oral Oral  SpO2: 97% 98% 99% 98%  Weight:      Height:        Intake/Output Summary (Last 24 hours) at 08/05/2019 0832 Last data filed at 08/04/2019 1900 Gross per 24 hour  Intake 1198.45 ml  Output -  Net 1198.45 ml   Filed Weights   08/04/19 0806 08/04/19 1622  Weight: 96.2 kg 95.1 kg    Telemetry    Atrial fibrillation with RVR - Personally Reviewed  ECG    No new EKG to review- Personally Reviewed  Physical Exam   GEN: No acute distress.   Neck: No JVD Cardiac: irregularly irregular and tachy, no murmurs, rubs, or gallops.  Respiratory: Clear to auscultation bilaterally. GI: Soft, nontender, non-distended  MS: No edema; No deformity. Neuro:  Nonfocal  Psych: Normal affect   Labs    Chemistry Recent Labs  Lab 08/03/19 1746 08/04/19 1910 08/05/19 0654  NA 138 138 139  K 4.7 3.6 4.1  CL 102 104 106  CO2 26 22 23   GLUCOSE 134*  106* 84  BUN 18 26* 24*  CREATININE 1.68* 1.56* 1.61*  CALCIUM 10.2 9.1 8.7*  GFRNONAA 39* 43* 41*  GFRAA 45* 50* 48*  ANIONGAP 10 12 10      Hematology Recent Labs  Lab 08/03/19 1746 08/05/19 0654  WBC 8.6 4.8  RBC 5.32 4.67  HGB 14.9 13.1  HCT 44.1 38.7*  MCV 82.9 82.9  MCH 28.0 28.1  MCHC 33.8 33.9  RDW 13.6 13.9  PLT 161 128*    Cardiac EnzymesNo results for input(s): TROPONINI in the last 168 hours. No results for input(s): TROPIPOC in the last 168 hours.   BNPNo results for input(s): BNP, PROBNP in the last 168 hours.   DDimer No results for input(s): DDIMER in the last 168 hours.   Radiology    Dg Chest 2 View  Result Date: 08/03/2019 CLINICAL DATA:  Chest pain EXAM: CHEST - 2 VIEW COMPARISON:  January 17, 2015 FINDINGS: The heart size and mediastinal contours are within normal limits. Both lungs are clear. The visualized skeletal structures are unremarkable. IMPRESSION: No acute cardiopulmonary process. Electronically Signed   By: Prudencio Pair M.D.   On: 08/03/2019 18:16    Cardiac Studies   None  Patient Profile     73 y.o. male with a  hx of NSTEMI in 2011 s/p PCI of RCA, DES to LAD 2015, DES to RCA 2016 with patent stents in the LAD and prior LCx stent by cath 2016.  He has CKD stage 3, HLF, HTN, persistent atrial fibrillation and TIA post cath in 2015.  Has been following with [name], PA recently for exertional CP and DOE.  Lexiscan myoview recently done showed possible inferior infarct vs. Bowel attenuation with EF 48%.  He was complaining also of fatigue and BB was stopped and changed to Cardizem for PAF.  He continue to have episodes of CP that are exertional and relieved with rest with radiation into his arms bilaterally but no n/V or diaphoresis.  He describes his CP has the burning sensation you get when you breath in cold air in the winter.  Assessment & Plan    1.  Unstable Angina -He was having some dyspnea on exertion prior to the stress test  but ever since the stress test was completed, he has pain with exertion. --hsTrop negative -continue IV Heparin gtt, ASA -Continue aspirin --increase Lopressor to 50mg  BID for HR and BP control --plan for LHC today  2.  Hyperlipidemia: -Goal LDL is less than 70, he is above that -After his labs were reviewed, he was told to continue Zetia but Crestor 10 mg daily was added to his medication regimen. -He was reluctant to take a statin because of previous memory issues -Follow-up in 3 months as directed  3.  Hypertension -BP remains elevated at 147/162mmhg -increasing  Lopressor to 50mg  BID.    4.  Persistent atrial fibrillation, RVR -he had been on lopressor which was changed to Cardizem but not controlling HR and BP at home like lopressor did -restarted on Lopressor instead of Cardizem.  Will increase to 50mg  BID today for better rate control -Of note, his fatigue had not really improved off the beta-blocker, it is possibly from the atrial fib -2D echo pending  5.  Chronic anticoagulation: -currently off Eliquis for cath -continue IV Heparin gtt  5.  CKD stage III -His BUN and creatinine are at baseline --creatinine this am 1.61  For questions or updates, please contact [place] Please consult [email or web address] for contact info under Cardiology/STEMI.      Signed, Fransico Him, MD  08/05/2019, 8:32 AM

## 2019-08-05 NOTE — Progress Notes (Signed)
  Echocardiogram 2D Echocardiogram has been performed.  Darlina Sicilian M 08/05/2019, 8:17 AM

## 2019-08-05 NOTE — Interval H&P Note (Signed)
Cath Lab Visit (complete for each Cath Lab visit)  Clinical Evaluation Leading to the Procedure:   ACS: Yes.    Non-ACS:    Anginal Classification: CCS IV  Anti-ischemic medical therapy: Minimal Therapy (1 class of medications)  Non-Invasive Test Results: No non-invasive testing performed  Prior CABG: No previous CABG      History and Physical Interval Note:  08/05/2019 2:15 PM  [name]  has presented today for surgery, with the diagnosis of angina.  The various methods of treatment have been discussed with the patient and family. After consideration of risks, benefits and other options for treatment, the patient has consented to  Procedure(s): LEFT HEART CATH AND CORONARY ANGIOGRAPHY (N/A) as a surgical intervention.  The patient's history has been reviewed, patient examined, no change in status, stable for surgery.  I have reviewed the patient's chart and labs.  Questions were answered to the patient's satisfaction.     [name]

## 2019-08-05 NOTE — Progress Notes (Signed)
Patient reports chesty discomfort 6-7/10 when up to the bathroom, resolved with rest

## 2019-08-05 NOTE — Progress Notes (Signed)
Progress Note  Patient Name: [name] Date of Encounter: 08/05/2019  Primary Cardiologist: Mertie Moores, MD   Subjective   Had some mild chest discomfort overnight. Tele with afib with RVR  Inpatient Medications    Scheduled Meds: . metoprolol tartrate  25 mg Oral BID  . sodium chloride flush  3 mL Intravenous Once  . sodium chloride flush  3 mL Intravenous Q12H  . sodium chloride flush  3 mL Intravenous Q12H   Continuous Infusions: . sodium chloride    . sodium chloride    . sodium chloride 1 mL/kg/hr (08/04/19 2206)  . sodium chloride    . heparin 1,350 Units/hr (08/04/19 2206)   PRN Meds: sodium chloride, sodium chloride, acetaminophen, ALPRAZolam, nitroGLYCERIN, ondansetron (ZOFRAN) IV, sodium chloride flush, sodium chloride flush, zolpidem   Vital Signs    Vitals:   08/04/19 2002 08/04/19 2321 08/05/19 0315 08/05/19 0700  BP: 131/90 (!) 127/98 123/76 (!) 147/110  Pulse: 87 87 90 92  Resp:   19 19  Temp: 98.4 F (36.9 C) 98.5 F (36.9 C) 97.9 F (36.6 C) 98 F (36.7 C)  TempSrc: Oral Oral Oral Oral  SpO2: 97% 98% 99% 98%  Weight:      Height:        Intake/Output Summary (Last 24 hours) at 08/05/2019 0832 Last data filed at 08/04/2019 1900 Gross per 24 hour  Intake 1198.45 ml  Output -  Net 1198.45 ml   Filed Weights   08/04/19 0806 08/04/19 1622  Weight: 96.2 kg 95.1 kg    Telemetry    Atrial fibrillation with RVR - Personally Reviewed  ECG    No new EKG to review- Personally Reviewed  Physical Exam   GEN: No acute distress.   Neck: No JVD Cardiac: irregularly irregular and tachy, no murmurs, rubs, or gallops.  Respiratory: Clear to auscultation bilaterally. GI: Soft, nontender, non-distended  MS: No edema; No deformity. Neuro:  Nonfocal  Psych: Normal affect   Labs    Chemistry Recent Labs  Lab 08/03/19 1746 08/04/19 1910 08/05/19 0654  NA 138 138 139  K 4.7 3.6 4.1  CL 102 104 106  CO2 26 22 23   GLUCOSE 134*  106* 84  BUN 18 26* 24*  CREATININE 1.68* 1.56* 1.61*  CALCIUM 10.2 9.1 8.7*  GFRNONAA 39* 43* 41*  GFRAA 45* 50* 48*  ANIONGAP 10 12 10      Hematology Recent Labs  Lab 08/03/19 1746 08/05/19 0654  WBC 8.6 4.8  RBC 5.32 4.67  HGB 14.9 13.1  HCT 44.1 38.7*  MCV 82.9 82.9  MCH 28.0 28.1  MCHC 33.8 33.9  RDW 13.6 13.9  PLT 161 128*    Cardiac EnzymesNo results for input(s): TROPONINI in the last 168 hours. No results for input(s): TROPIPOC in the last 168 hours.   BNPNo results for input(s): BNP, PROBNP in the last 168 hours.   DDimer No results for input(s): DDIMER in the last 168 hours.   Radiology    Dg Chest 2 View  Result Date: 08/03/2019 CLINICAL DATA:  Chest pain EXAM: CHEST - 2 VIEW COMPARISON:  January 17, 2015 FINDINGS: The heart size and mediastinal contours are within normal limits. Both lungs are clear. The visualized skeletal structures are unremarkable. IMPRESSION: No acute cardiopulmonary process. Electronically Signed   By: Prudencio Pair M.D.   On: 08/03/2019 18:16    Cardiac Studies   None  Patient Profile     73 y.o. male with a  hx of NSTEMI in 2011 s/p PCI of RCA, DES to LAD 2015, DES to RCA 2016 with patent stents in the LAD and prior LCx stent by cath 2016.  He has CKD stage 3, HLF, HTN, persistent atrial fibrillation and TIA post cath in 2015.  Has been following with [name], PA recently for exertional CP and DOE.  Lexiscan myoview recently done showed possible inferior infarct vs. Bowel attenuation with EF 48%.  He was complaining also of fatigue and BB was stopped and changed to Cardizem for PAF.  He continue to have episodes of CP that are exertional and relieved with rest with radiation into his arms bilaterally but no n/V or diaphoresis.  He describes his CP has the burning sensation you get when you breath in cold air in the winter.  Assessment & Plan    1.  Unstable Angina -He was having some dyspnea on exertion prior to the stress test  but ever since the stress test was completed, he has pain with exertion. --hsTrop negative -continue IV Heparin gtt, ASA -Continue aspirin --increase Lopressor to 50mg  BID for HR and BP control --plan for LHC today  2.  Hyperlipidemia: -Goal LDL is less than 70, he is above that -After his labs were reviewed, he was told to continue Zetia but Crestor 10 mg daily was added to his medication regimen. -He was reluctant to take a statin because of previous memory issues -Follow-up in 3 months as directed  3.  Hypertension -BP remains elevated at 147/122mmhg -increasing  Lopressor to 50mg  BID.    4.  Persistent atrial fibrillation, RVR -he had been on lopressor which was changed to Cardizem but not controlling HR and BP at home like lopressor did -restarted on Lopressor instead of Cardizem.  Will increase to 50mg  BID today for better rate control -Of note, his fatigue had not really improved off the beta-blocker, it is possibly from the atrial fib -2D echo pending  5.  Chronic anticoagulation: -currently off Eliquis for cath -continue IV Heparin gtt  5.  CKD stage III -His BUN and creatinine are at baseline --creatinine this am 1.61  For questions or updates, please contact [place] Please consult [email or web address] for contact info under Cardiology/STEMI.      Signed, Fransico Him, MD  08/05/2019, 8:32 AM

## 2019-08-06 DIAGNOSIS — I10 Essential (primary) hypertension: Secondary | ICD-10-CM | POA: Diagnosis not present

## 2019-08-06 DIAGNOSIS — I4819 Other persistent atrial fibrillation: Secondary | ICD-10-CM | POA: Diagnosis not present

## 2019-08-06 DIAGNOSIS — I200 Unstable angina: Secondary | ICD-10-CM | POA: Diagnosis not present

## 2019-08-06 DIAGNOSIS — E785 Hyperlipidemia, unspecified: Secondary | ICD-10-CM | POA: Diagnosis not present

## 2019-08-06 LAB — BASIC METABOLIC PANEL
Anion gap: 10 (ref 5–15)
BUN: 23 mg/dL (ref 8–23)
CO2: 23 mmol/L (ref 22–32)
Calcium: 8.7 mg/dL — ABNORMAL LOW (ref 8.9–10.3)
Chloride: 106 mmol/L (ref 98–111)
Creatinine, Ser: 1.65 mg/dL — ABNORMAL HIGH (ref 0.61–1.24)
GFR calc Af Amer: 46 mL/min — ABNORMAL LOW (ref >60)
GFR calc non Af Amer: 40 mL/min — ABNORMAL LOW (ref >60)
Glucose, Bld: 91 mg/dL (ref 70–99)
Potassium: 3.8 mmol/L (ref 3.5–5.1)
Sodium: 139 mmol/L (ref 135–145)

## 2019-08-06 LAB — CBC
HCT: 38.6 % — ABNORMAL LOW (ref 39.0–52.0)
Hemoglobin: 13.3 g/dL (ref 13.0–17.0)
MCH: 28.1 pg (ref 26.0–34.0)
MCHC: 34.5 g/dL (ref 30.0–36.0)
MCV: 81.6 fL (ref 80.0–100.0)
Platelets: 123 10*3/uL — ABNORMAL LOW (ref 150–400)
RBC: 4.73 MIL/uL (ref 4.22–5.81)
RDW: 13.5 % (ref 11.5–15.5)
WBC: 5.4 10*3/uL (ref 4.0–10.5)
nRBC: 0 % (ref 0.0–0.2)

## 2019-08-06 LAB — APTT: aPTT: 25 seconds (ref 24–36)

## 2019-08-06 MED ORDER — CLOPIDOGREL BISULFATE 75 MG PO TABS: 75.0000 mg | ORAL_TABLET | Freq: Every day | ORAL | 6 refills | Status: DC

## 2019-08-06 MED ORDER — NITROGLYCERIN 0.4 MG SL SUBL: 0.4000 mg | SUBLINGUAL_TABLET | SUBLINGUAL | 4 refills | Status: AC | PRN

## 2019-08-06 MED ORDER — METOPROLOL TARTRATE 50 MG PO TABS: 50.0000 mg | ORAL_TABLET | Freq: Two times a day (BID) | ORAL | 6 refills | Status: DC

## 2019-08-06 MED ORDER — APIXABAN 5 MG PO TABS
5.0000 mg | ORAL_TABLET | Freq: Two times a day (BID) | ORAL | Status: DC
  Administered 2019-08-06: 5 mg via ORAL
  Filled 2019-08-06: qty 1

## 2019-08-06 NOTE — Plan of Care (Signed)

## 2019-08-06 NOTE — Progress Notes (Signed)
TR band removed, pressure dressing applied. Site level 1.  Pt educated.  Pt arm elevated still on pillow.  Call bell in reach.  Will continue to monitor. Saunders Revel T

## 2019-08-06 NOTE — Discharge Instructions (Signed)
Call [facility] at [phone] if any bleeding, swelling or drainage at cath site.  May shower, no tub baths for 48 hours for groin sticks. No lifting over 5 pounds for 3 days.  No Driving for 3 days  Complete your prednisone per your primary care MDs instructions.  Heart Healthy diet   Continue Eliquis and we added plavix for at least 6 months to keep stent open

## 2019-08-06 NOTE — Progress Notes (Signed)
CARDIAC REHAB PHASE I   PRE:  Rate/Rhythm: 90 afib  BP:  Supine: 152/98  leg  Sitting:   Standing:    SaO2: 98%RA  MODE:  Ambulation: 0 ft Gout  [phone] Pt stated he was taking prednisone for left foot gout which was helping but now has flared up again. Pt stated he did not get to finish his prednisone that PCP had prescribed. Did not want to walk this morning. Does not normally have any difficulty with walking. Reviewed importance of plavix with stent. Pt does not take NTG due to episode where he did not tolerate it well. Discussed to call 911 if CP. Gave heart healthy diet sheet and briefly discussed healthy food choices. Gave written ex ed for when gout resolved. Discussed CRP 2 and referred to [facility] but pt does not think he will do. Pt voiced understanding of ed.     Graylon Good, RN BSN  08/06/2019 8:22 AM

## 2019-08-06 NOTE — Discharge Summary (Addendum)
Discharge Summary    Patient ID: [name] MRN: [id number]; DOB: 08-25-1946  Admit date: 08/03/2019 Discharge date: 08/06/2019  Primary Care Provider: Binnie Rail, [name]  Primary Cardiologist: [name]  Primary Electrophysiologist:  None   Discharge Diagnoses    Principal Problem:   Progressive angina [facility]) Active Problems:   Hyperlipidemia LDL goal <70   Essential hypertension   Unstable angina ([name])   Persistent atrial fibrillation ([name]), rapid ventricular response    Diagnostic Studies/Procedures    Cardiac cath 08/05/19 and PCI  Mid RCA lesion is 25% stenosed.  Previously placed Prox RCA to Mid RCA stent (unknown type) is widely patent.  Previously placed Prox LAD to Mid LAD stent (unknown type) is widely patent.  Mid LAD lesion is 90% stenosed.  A drug-eluting stent was successfully placed using a STENT SYNERGY DES 2.5X24, postdilated to 2.75 mm.  Post intervention, there is a 0% residual stenosis.  LV end diastolic pressure is low.  There is no aortic valve stenosis.   Continue aggressive secondary prevention.    Restart Eliquis tomorrow.  Continue Plavix for 6 months.  No aspirin given Eliquis.   Diagnostic Dominance: Right  Intervention    _____________  Echo 08/05/19 IMPRESSIONS    1. Left ventricular ejection fraction, by visual estimation, is 55 to 60%. The left ventricle has normal function. There is no left ventricular hypertrophy.  2. Global right ventricle has normal systolic function.The right ventricular size is mildly enlarged. No increase in right ventricular wall thickness.  3. Left atrial size was mildly dilated.  4. Right atrial size was normal.  5. The mitral valve is normal in structure. Mild mitral valve regurgitation. No evidence of mitral stenosis.  6. The tricuspid valve is normal in structure. Tricuspid valve regurgitation is mild.  7. The aortic valve is normal in structure. Aortic valve regurgitation is  trivial. No evidence of aortic valve sclerosis or stenosis.  8. The pulmonic valve was normal in structure. Pulmonic valve regurgitation is not visualized.  9. Mildly elevated pulmonary artery systolic pressure. 10. The tricuspid regurgitant velocity is 2.79 m/s, and with an assumed right atrial pressure of 8 mmHg, the estimated right ventricular systolic pressure is mildly elevated at 39.1 mmHg. 11. The inferior vena cava is normal in size with greater than 50% respiratory variability, suggesting right atrial pressure of 3 mmHg. 12. The average left ventricular global longitudinal strain is -12.0 %.  FINDINGS  Left Ventricle: Left ventricular ejection fraction, by visual estimation, is 55 to 60%. The left ventricle has normal function. The average left ventricular global longitudinal strain is -12.0 %. There is no left ventricular hypertrophy. Normal left  atrial pressure.  Right Ventricle: The right ventricular size is mildly enlarged. No increase in right ventricular wall thickness. Global RV systolic function is has normal systolic function. The tricuspid regurgitant velocity is 2.79 m/s, and with an assumed right atrial  pressure of 8 mmHg, the estimated right ventricular systolic pressure is mildly elevated at 39.1 mmHg.  Left Atrium: Left atrial size was mildly dilated.  Right Atrium: Right atrial size was normal in size  Pericardium: There is no evidence of pericardial effusion.  Mitral Valve: The mitral valve is normal in structure. No evidence of mitral valve stenosis by observation. Mild mitral valve regurgitation.  Tricuspid Valve: The tricuspid valve is normal in structure. Tricuspid valve regurgitation is mild.  Aortic Valve: The aortic valve is normal in structure. Aortic valve regurgitation is trivial. The aortic valve  is structurally normal, with no evidence of sclerosis or stenosis.  Pulmonic Valve: The pulmonic valve was normal in structure. Pulmonic valve  regurgitation is not visualized.  Aorta: The aortic root, ascending aorta and aortic arch are all structurally normal, with no evidence of dilitation or obstruction.  Venous: The inferior vena cava is normal in size with greater than 50% respiratory variability, suggesting right atrial pressure of 3 mmHg.  IAS/Shunts: No atrial level shunt detected by color flow Doppler. No ventricular septal defect is seen or detected. There is no evidence of an atrial septal defect.      History of Present Illness     [name] is a 73 y.o. 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    Pt had been having DOE and fatigue and had nuc study that was neg for ischemia 07/26/19 his BB was changed to dilt.  And amlodipine stopped.  Since changes pt has had episodes of chest pain usually with walking, and associated with dyspnea.  On the 28th of OCT pain increased to include both arms.  Pt was admitted and placed on IV heparin.  Plan for cardiac cath.  His eliquis was held for his persistent a fib.  His BB was resumed at lower dose.  troponins in ER neg and COVID neg CBC was normal and Cr 1.68   Hospital Course     Consultants: none     Pt did have further angina overnight.  His HR was still elevated. His lipids were not at goal so crestor was added.  His BB was increased as well.   Cardiac cath was performed with results as above.   DES placed to mLAD lesion of 90% and reduced to 0%.  Plan for no ASA since pt will be on plavix for 6 months and continue Eliquis.   His EF was normal.    Today he was seen by [name] and found stable for discharge.   He was complaining of gout, and had been on prednisone.  He did not ambulate with cardiac rehab due to this issue.  Phase 2 rehab was discussed but pt does  not think he will do.  He will follow up in 2 weeks with Dr. Acie [name] or APP -- he can complete his prednisone for gout, 40 mg for total of 5 days.   Did the patient have an acute coronary syndrome (MI, NSTEMI, STEMI, etc) this admission?:  No                               Did the patient have a percutaneous coronary intervention (stent / angioplasty)?:  Yes.     Cath/PCI Registry Performance & Quality Measures: 1. Aspirin prescribed? - No - on eliquis 2. ADP Receptor Inhibitor (Plavix/Clopidogrel, Brilinta/Ticagrelor or Effient/Prasugrel) prescribed (includes medically managed patients)? - Yes 3. High Intensity Statin (Lipitor 40-80mg  or Crestor 20-40mg ) prescribed? - Yes 4. For EF <40%, was ACEI/ARB prescribed? - Not Applicable (EF >/= [id number]) 5. For EF <40%, Aldosterone Antagonist (Spironolactone or Eplerenone) prescribed? - Not Applicable (EF >/= [id number]) 6. Cardiac Rehab Phase II ordered (Included Medically managed Patients)? - Yes   _____________  Discharge Vitals Blood pressure 114/74, pulse  78, temperature 98 F (36.7 C), temperature source Oral, resp. rate 12, height 6\' 4"  (1.93 m), weight 92.7 kg, SpO2 99 %.  Filed Weights   08/04/19 0806 08/04/19 1622 08/06/19 0500  Weight: 96.2 kg 95.1 kg 92.7 kg    Labs & Radiologic Studies    CBC Recent Labs    08/05/19 0654 08/06/19 0304  WBC 4.8 5.4  HGB 13.1 13.3  HCT 38.7* 38.6*  MCV 82.9 81.6  PLT 128* [id number]   Basic Metabolic Panel Recent Labs    08/05/19 0654 08/06/19 0304  NA 139 139  K 4.1 3.8  CL 106 106  CO2 23 23  GLUCOSE 84 91  BUN 24* 23  CREATININE 1.61* 1.65*  CALCIUM 8.7* 8.7*   Liver Function Tests No results for input(s): AST, ALT, ALKPHOS, BILITOT, PROT, ALBUMIN in the last 72 hours. No results for input(s): LIPASE, AMYLASE in the last 72 hours. High Sensitivity Troponin:   Recent Labs  Lab 08/03/19 1746 08/03/19 2022  TROPONINIHS <2 <2    BNP Invalid input(s): POCBNP D-Dimer No results for  input(s): DDIMER in the last 72 hours. Hemoglobin A1C Recent Labs    08/04/19 1910  HGBA1C 5.5   Fasting Lipid Panel No results for input(s): CHOL, HDL, LDLCALC, TRIG, CHOLHDL, LDLDIRECT in the last 72 hours. Thyroid Function Tests No results for input(s): TSH, T4TOTAL, T3FREE, THYROIDAB in the last 72 hours.  Invalid input(s): FREET3 _____________  Dg Chest 2 View  Result Date: 08/03/2019 CLINICAL DATA:  Chest pain EXAM: CHEST - 2 VIEW COMPARISON:  January 17, 2015 FINDINGS: The heart size and mediastinal contours are within normal limits. Both lungs are clear. The visualized skeletal structures are unremarkable. IMPRESSION: No acute cardiopulmonary process. Electronically Signed   By: Prudencio Pair M.D.   On: 08/03/2019 18:16   Disposition   Pt is being discharged home today in good condition.  Follow-up Plans & Appointments   Call [facility] at [phone] if any bleeding, swelling or drainage at cath site.  May shower, no tub baths for 48 hours for groin sticks. No lifting over 5 pounds for 3 days.  No Driving for 3 days  Complete your prednisone per your primary care MDs instructions.  Heart Healthy diet   Continue Eliquis and we added plavix for at least 6 months to keep stent open    Follow-up Information    Nah[facility], Wonda Cheng, [name] Follow up.   Specialty: Cardiology Why: the office will call you Monday for follow up date and time with Dr. Acie [name] or PA/NP if you have not heard by Tuesday call the office.  Contact information: [address] [place] [address] [phone]          Discharge Instructions    Amb Referral to Cardiac Rehabilitation   Complete by: As directed    Diagnosis: Coronary Stents   After initial evaluation and assessments completed: Virtual Based Care may be provided alone or in conjunction with Phase 2 Cardiac Rehab based on patient barriers.: Yes      Discharge Medications   Allergies as of  08/06/2019      Reactions   Nitroglycerin Other (See Comments)   Pt will not take.  States he was in hospital and after taking second NTG SL pt states "I had to be shocked, I am scared of that medicine and will not take again".    Lidocaine Other (See Comments)   Syncope post palmer injection;  probably vasovagal as no reaction to intraarticular Lidocaine into knee      Medication List    STOP taking these medications   aspirin EC 81 MG tablet     TAKE these medications   allopurinol 100 MG tablet Commonly known as: ZYLOPRIM Take 1 tablet (100 mg total) by mouth daily. Notes to patient: 08/07/2019   apixaban 5 MG Tabs tablet Commonly known as: Eliquis Take 1 tablet (5 mg total) by mouth 2 (two) times daily. Notes to patient: This evening next dose   calcium carbonate 500 MG chewable tablet Commonly known as: TUMS - dosed in mg elemental calcium Chew 2 tablets by mouth daily as needed for indigestion or heartburn.   clopidogrel 75 MG tablet Commonly known as: PLAVIX Take 1 tablet (75 mg total) by mouth daily with breakfast. Start taking on: August 07, 2019   diltiazem 240 MG 24 hr capsule Commonly known as: CARDIZEM CD Take 1 capsule (240 mg total) by mouth daily. Notes to patient: 08/07/2019   ezetimibe 10 MG tablet Commonly known as: ZETIA Take 1 tablet (10 mg total) by mouth daily. Notes to patient: 08/07/2019   fish oil-omega-3 fatty acids 1000 MG capsule Take 2 g by mouth 2 (two) times daily. Notes to patient: 08/07/2019   metoprolol tartrate 50 MG tablet Commonly known as: LOPRESSOR Take 1 tablet (50 mg total) by mouth 2 (two) times daily.   nitroGLYCERIN 0.4 MG SL tablet Commonly known as: NITROSTAT Place 1 tablet (0.4 mg total) under the tongue every 5 (five) minutes x 3 doses as needed for chest pain.   Potassium Gluconate 2 MEQ Tabs Take 1 tablet by mouth daily. Notes to patient: 08/07/2019   predniSONE 20 MG tablet Commonly known as: DELTASONE Take 2  tablets (40 mg total) by mouth daily with breakfast. Notes to patient: 08/07/2019   rosuvastatin 10 MG tablet Commonly known as: Crestor Take 1 tablet (10 mg total) by mouth daily. Notes to patient: 08/07/2019   TART CHERRY ADVANCED PO Take 1,000 mg by mouth daily. Notes to patient: 08/07/2019   VITAMIN B12 PO Take 1 tablet by mouth every evening. Notes to patient: 08/07/2019          Outstanding Labs/Studies   BMP  Duration of Discharge Encounter   Greater than 30 minutes including physician time.  Signed, Cecilie Kicks, NP 08/06/2019, 1:25 PM   Patient seen and examined.  Agree with above documentation.  On exam today:  GEN: Well nourished, well developed, in no acute distress HEENT: normal Neck: no JVD, carotid bruits, or masses Cardiac: irregular, no murmurs Respiratory:  clear to auscultation bilaterally GI: soft, nontender, nondistended MS: no deformity or atrophy Skin: warm and dry, no rash Neuro:  Alert and Oriented x 3, Strength and sensation are intact Psych: normal affect

## 2019-08-06 NOTE — Progress Notes (Signed)
Pt got discharged to home, discharge instructions provided and patient showed understanding to it, IV taken out,Telemonitor DC,pt left unit in wheelchair with all of the belongings accompanied with a family member (wife)  Aveena Bari,RN 

## 2019-08-08 ENCOUNTER — Encounter (HOSPITAL_COMMUNITY): Payer: Self-pay | Admitting: Interventional Cardiology

## 2019-08-10 ENCOUNTER — Telehealth (HOSPITAL_COMMUNITY): Payer: Self-pay

## 2019-08-10 ENCOUNTER — Telehealth: Payer: Self-pay | Admitting: Internal Medicine

## 2019-08-10 MED ORDER — PREDNISONE 10 MG PO TABS: ORAL_TABLET | ORAL | 0 refills | Status: DC

## 2019-08-10 NOTE — Telephone Encounter (Signed)
Pt aware.

## 2019-08-10 NOTE — Telephone Encounter (Signed)
Pt states that the prednisone that was given to his at the visit on 10/27 helped with the gout problem. When he went to the ED on 10/28 they took him off of it and the gout came back. He ended up taking the rest of the prednisone until he ran out. He states his toe is still feeling sensitive and getting hard to walk on again. Is there something that can be sent in for gout?

## 2019-08-10 NOTE — Telephone Encounter (Signed)
Pt insurance is active and benefits verified through [facility]. Co-pay $45.00, DED $0.00/$0.00 met, out of pocket $4,200.00/$378.74 met, co-insurance 0%. No pre-authorization required. John S./[employer], 08/10/2019 @ 1:31PM, APO#[id number]

## 2019-08-10 NOTE — Telephone Encounter (Signed)
New prednisone prescription sent to [facility]

## 2019-08-10 NOTE — Telephone Encounter (Signed)
Pt wants to know if the allopurinol (ZYLOPRIM) 100 MG tablet  Will clear up his gout or if could he possibly have another flare up like he did in the hospital. Requesting CB. Please advise.

## 2019-08-12 ENCOUNTER — Telehealth (HOSPITAL_COMMUNITY): Payer: Self-pay

## 2019-08-12 NOTE — Telephone Encounter (Signed)
Called and spoke with pt in regards to CR, pt stated he is not interested.   Closed referral 

## 2019-08-15 ENCOUNTER — Telehealth: Payer: Self-pay

## 2019-08-15 NOTE — Telephone Encounter (Signed)
-----   Message from Liliane Shi, [place] sent at 08/15/2019  1:18 PM EST ----- No we can cancel the monitor.   Thanks Nicki Reaper  ----- Message ----- From: Carylon Perches, CMA Sent: 08/15/2019  10:59 AM EST To: Liliane Shi, PA-C  Please review and let me know if this pt still needs a monitor.  Thanks, Coumba Kellison

## 2019-08-15 NOTE — Telephone Encounter (Signed)
Monitor order canceled

## 2019-08-24 ENCOUNTER — Other Ambulatory Visit: Payer: Self-pay

## 2019-08-24 ENCOUNTER — Encounter: Payer: Self-pay | Admitting: Cardiology

## 2019-08-24 ENCOUNTER — Ambulatory Visit: Payer: Medicare HMO | Admitting: Cardiology

## 2019-08-24 VITALS — BP 120/70 | HR 70 | Ht 76.0 in | Wt 211.0 lb

## 2019-08-24 DIAGNOSIS — I4819 Other persistent atrial fibrillation: Secondary | ICD-10-CM

## 2019-08-24 DIAGNOSIS — Z95820 Peripheral vascular angioplasty status with implants and grafts: Secondary | ICD-10-CM | POA: Diagnosis not present

## 2019-08-24 DIAGNOSIS — E785 Hyperlipidemia, unspecified: Secondary | ICD-10-CM | POA: Diagnosis not present

## 2019-08-24 DIAGNOSIS — I10 Essential (primary) hypertension: Secondary | ICD-10-CM

## 2019-08-24 DIAGNOSIS — N1832 Chronic kidney disease, stage 3b: Secondary | ICD-10-CM | POA: Diagnosis not present

## 2019-08-24 MED ORDER — ROSUVASTATIN CALCIUM 20 MG PO TABS: 20.0000 mg | ORAL_TABLET | Freq: Every day | ORAL | 3 refills | Status: DC

## 2019-08-24 NOTE — Progress Notes (Signed)
Cardiology Office Note:    Date:  08/24/2019   ID:  [name], DOB [birth year], MRN [id number]  PCP:  Binnie Rail, MD  Cardiologist:  Mertie Moores, MD  Referring MD: Binnie Rail, MD   Chief Complaint  Patient presents with  . Hospitalization Follow-up  . Coronary Artery Disease    S/P PCI/DES    History of Present Illness:    [name] is a 73 y.o. male with a past medical history significant for NSTEMI 2011 s/p DES RCA, 2015 DES LAD,DES RCA w/ otherpatent stents 2016, MV ok 07/29/2019 w/ EF 48% and bowelattn but no ischemia, HTN, HLD, CKD III, PE, persistent Afib on Eliquis, prostate CA, CVA/TIAafter cath 2015.    Afib was diagnosed in February of 2020 and he has continued in afib.  The patient was seen in the office on 07/26/2019 by [name], PA and was having dyspnea on exertion and fatigue.  A nuclear study was negative for ischemia and beta-blocker was changed to diltiazem.  Amlodipine was stopped.  He later developed chest pain which progressively increased to include both arms.  He was admitted to the hospital on 08/03/2019.  Troponins were negative.  Beta-blocker was resumed.  His angina persisted so he was taken for cardiac catheterization on 08/05/2019 with finding of 90% mid LAD stenosis that was treated with drug-eluting stent.  Patient was continued on his Eliquis with addition of Plavix, no aspirin.  Plavix is planned for 6 months.  LVEF was normal.  The patient complained of gout while in the hospital and was given prednisone.  He is offered cardiac rehab phase 2 but he declined.  [name] is here today for hospital follow-up.  He reports doing very well.  No further shortness of breath, chest pain or fatigue.  He spread out 30 tons of gravel with a tractor yesterday. No orthopnea, PND, palpitations, lightheadedness.  Right wrist cath site is healing well with some residual ecchymosis.  He reports compliance with all of his medications with no  medication issues.  He says that he usually eats a fairly healthy diet.  He is very active but does not do any organized exercise.   Cardiac studies   Cardiac cath 08/05/19 and PCI  Mid RCA lesion is 25% stenosed.  Previously placed Prox RCA to Mid RCA stent (unknown type) is widely patent.  Previously placed Prox LAD to Mid LAD stent (unknown type) is widely patent.  Mid LAD lesion is 90% stenosed.  A drug-eluting stent was successfully placed using a STENT SYNERGY DES 2.5X24, postdilated to 2.75 mm.  Post intervention, there is a 0% residual stenosis.  LV end diastolic pressure is low.  There is no aortic valve stenosis.  Continue aggressive secondary prevention.   Restart Eliquis tomorrow. Continue Plavix for 6 months. No aspirin given Eliquis.  Diagnostic Dominance: Right  Intervention    _____________  Echo 08/05/19 IMPRESSIONS 1. Left ventricular ejection fraction, by visual estimation, is 55 to 60%. The left ventricle has normal function. There is no left ventricular hypertrophy. 2. Global right ventricle has normal systolic function.The right ventricular size is mildly enlarged. No increase in right ventricular wall thickness. 3. Left atrial size was mildly dilated. 4. Right atrial size was normal. 5. The mitral valve is normal in structure. Mild mitral valve regurgitation. No evidence of mitral stenosis. 6. The tricuspid valve is normal in structure. Tricuspid valve regurgitation is mild. 7. The aortic valve is normal in structure. Aortic  valve regurgitation is trivial. No evidence of aortic valve sclerosis or stenosis. 8. The pulmonic valve was normal in structure. Pulmonic valve regurgitation is not visualized. 9. Mildly elevated pulmonary artery systolic pressure. 10. The tricuspid regurgitant velocity is 2.79 m/s, and with an assumed right atrial pressure of 8 mmHg, the estimated right ventricular systolic pressure is mildly elevated at  39.1 mmHg. 11. The inferior vena cava is normal in size with greater than 50% respiratory variability, suggesting right atrial pressure of 3 mmHg. 12. The average left ventricular global longitudinal strain is -12.0 %.  LT Cardiac Monitor (Zio) 01/20/2019  Atrial fibrillation, Avg HR 75  Rare PVCs or aberrantly conducted beats  No significant pauses or episodes of tachycardia  Echo 12/08/2018 EF 55-60, mild LAE, trivial MR, mild TR  Past Medical History:  Diagnosis Date  . Anemia, mild   . CAD (coronary artery disease)    a. 02/2010 NSTEMI/PCI: PROMUS DES to mRCA. b. 06/05/14 Cath/PCI: pLAD 70%--> s/p PCI/DES (Promus DES);  c. 01/2015 Cath/PCI: LM nl, LAD 26m, patent prox stent, LCX patent prox stent, RCA 30p, 60m (3.5x20 Synergy DES). // Myoview 07/2019:  EF 48, atten artifact, no ischemia, intermediate risk (low EF)   . CKD (chronic kidney disease), stage III   . Diastolic dysfunction    a. Echo 6/11: mild LVH, EF 55-60%, GR1DD, Trivial MR, mild LAE. b. echo 06/04/14: EF 55-60%, no WMA, GR1DD, Ao valve mildly calcifed, mild MR, mild LAE  . History of pulmonary embolism   . Hyperlipidemia   . Hypertension    did not tolerate Lisinopril event at low dose  . Melanoma ([place])    a. 1987. b. 2012  . Myocardial infarction ([place])   . Persistent atrial fibrillation ([place]) 12/03/2018   Dx 11/2018 >> Apixaban started // Echo 12/2018: EF 55-60, mod RVE, normal RVSF, mild LAE, trivial MR, mild TR, mod AoV sclerosis   . Prostate cancer ([place])    Dr Karsten Ro  . Stroke ([place])   . TIA (transient ischemic attack)    a. post cath and PCI on 06/05/2014 - no residual sequelae.  . Vitamin B12 deficiency     Past Surgical History:  Procedure Laterality Date  . CHOLECYSTECTOMY    . COLONOSCOPY  2006  . CORONARY STENT INTERVENTION N/A 08/05/2019   Procedure: CORONARY STENT INTERVENTION;  Surgeon: Jettie Booze, MD;  Location: [facility];  Service: Cardiovascular;  Laterality: N/A;  . KNEE  ARTHROSCOPY     right  . LEFT HEART CATH AND CORONARY ANGIOGRAPHY N/A 08/05/2019   Procedure: LEFT HEART CATH AND CORONARY ANGIOGRAPHY;  Surgeon: Jettie Booze, MD;  Location: [facility];  Service: Cardiovascular;  Laterality: N/A;  . LEFT HEART CATHETERIZATION WITH CORONARY ANGIOGRAM N/A 06/05/2014   Procedure: LEFT HEART CATHETERIZATION WITH CORONARY ANGIOGRAM;  Surgeon: Blane Ohara, MD;  Location[facility];  Service: Cardiovascular;  Laterality: N/A;  . LEFT HEART CATHETERIZATION WITH CORONARY ANGIOGRAM N/A 01/19/2015   Procedure: LEFT HEART CATHETERIZATION WITH CORONARY ANGIOGRAM;  Surgeon: Peter M Martinique, MD;  Location: [facility] CATH LAB;  Service: Cardiovascular;  Laterality: N/A;  . MELANOMA EXCISION  2011    L chest  . MELANOMA EXCISION  1991   back  . mRCA stent  02/2010  . PROSTATECTOMY     Dr. Karsten Ro  . TONSILLECTOMY      Current Medications: Current Meds  Medication Sig  . allopurinol (ZYLOPRIM) 100 MG tablet Take 1 tablet (100 mg  total) by mouth daily.  Marland Kitchen apixaban (ELIQUIS) 5 MG TABS tablet Take 1 tablet (5 mg total) by mouth 2 (two) times daily.  . calcium carbonate (TUMS - DOSED IN MG ELEMENTAL CALCIUM) 500 MG chewable tablet Chew 2 tablets by mouth daily as needed for indigestion or heartburn.   . clopidogrel (PLAVIX) 75 MG tablet Take 1 tablet (75 mg total) by mouth daily with breakfast.  . Cyanocobalamin (VITAMIN B12 PO) Take 1 tablet by mouth every evening.  . diltiazem (CARDIZEM CD) 240 MG 24 hr capsule Take 1 capsule (240 mg total) by mouth daily.  Marland Kitchen ezetimibe (ZETIA) 10 MG tablet Take 1 tablet (10 mg total) by mouth daily.  . fish oil-omega-3 fatty acids 1000 MG capsule Take 2 g by mouth 2 (two) times daily.    . metoprolol tartrate (LOPRESSOR) 50 MG tablet Take 1 tablet (50 mg total) by mouth 2 (two) times daily.  . Misc Natural Products (TART CHERRY ADVANCED PO) Take 1,000 mg by mouth daily.  . nitroGLYCERIN (NITROSTAT) 0.4 MG SL tablet Place 1  tablet (0.4 mg total) under the tongue every 5 (five) minutes x 3 doses as needed for chest pain.  Marland Kitchen Potassium Gluconate 2 MEQ TABS Take 1 tablet by mouth daily.  . predniSONE (DELTASONE) 10 MG tablet Take 4 tabs po qd x 3 days, then 3 tabs po qd x 3 days, then 2 tabs po qd x 3 days, then 1 tab po qd x 3 days  . [DISCONTINUED] rosuvastatin (CRESTOR) 10 MG tablet Take 1 tablet (10 mg total) by mouth daily.     Allergies:   Nitroglycerin and Lidocaine   Social History   Socioeconomic History  . Marital status: Married    Spouse name: Not on file  . Number of children: Not on file  . Years of education: Not on file  . Highest education level: Not on file  Occupational History  . Not on file  Social Needs  . Financial resource strain: Not on file  . Food insecurity    Worry: Not on file    Inability: Not on file  . Transportation needs    Medical: Not on file    Non-medical: Not on file  Tobacco Use  . Smoking status: Never Smoker  . Smokeless tobacco: Never Used  Substance and Sexual Activity  . Alcohol use: No    Alcohol/week: 0.0 standard drinks  . Drug use: No  . Sexual activity: Not on file  Lifestyle  . Physical activity    Days per week: Not on file    Minutes per session: Not on file  . Stress: Not on file  Relationships  . Social Herbalist on phone: Not on file    Gets together: Not on file    Attends religious service: Not on file    Active member of club or organization: Not on file    Attends meetings of clubs or organizations: Not on file    Relationship status: Not on file  Other Topics Concern  . Not on file  Social History Narrative  . Not on file     Family History: The patient's family history includes Coronary artery disease in his mother; Diabetes in his sister; Heart disease in his mother; Hypertension in his brother; Prostate cancer in his father; Transient ischemic attack (age of onset: 47) in his mother. ROS:   Please see the  history of present illness.     All other  systems reviewed and are negative.   EKG:  EKG is not ordered today.    Recent Labs: 12/22/2018: TSH 4.90 01/05/2019: NT-Pro BNP 689 07/20/2019: ALT 22 08/06/2019: BUN 23; Creatinine, Ser 1.65; Hemoglobin 13.3; Platelets 123; Potassium 3.8; Sodium 139   Recent Lipid Panel    Component Value Date/Time   CHOL 168 07/20/2019 0821   CHOL 122 (L) 02/07/2016 0745   TRIG 140 07/20/2019 0821   TRIG 113 02/07/2016 0745   TRIG 220 (HH) 09/07/2006 1105   HDL 37 (L) 07/20/2019 0821   HDL 38 (L) 02/07/2016 0745   CHOLHDL 4.5 07/20/2019 0821   CHOLHDL 3 09/14/2018 1130   VLDL 24.4 09/14/2018 1130   LDLCALC 106 (H) 07/20/2019 0821   LDLCALC 61 02/07/2016 0745   LDLDIRECT 68.8 07/20/2012 0837    Physical Exam:    VS:  BP 120/70   Pulse 70   Ht 6\' 4"  (1.93 m)   Wt 211 lb (95.7 kg)   SpO2 99%   BMI 25.68 kg/m     Wt Readings from Last 6 Encounters:  08/24/19 211 lb (95.7 kg)  08/06/19 204 lb 4.8 oz (92.7 kg)  08/02/19 212 lb (96.2 kg)  07/29/19 215 lb (97.5 kg)  07/26/19 215 lb 12.8 oz (97.9 kg)  02/04/19 215 lb (97.5 kg)     Physical Exam  Constitutional: He is oriented to person, place, and time. He appears well-developed and well-nourished. No distress.  HENT:  Head: Normocephalic and atraumatic.  Neck: Normal range of motion. Neck supple. No JVD present.  Cardiovascular: Normal rate and intact distal pulses. An irregularly irregular rhythm present. Exam reveals no gallop and no friction rub.  No murmur heard. Pulmonary/Chest: Effort normal and breath sounds normal. No respiratory distress. He has no wheezes. He has no rales.  Abdominal: Soft. Bowel sounds are normal.  Musculoskeletal: Normal range of motion.        General: No edema.  Neurological: He is alert and oriented to person, place, and time.  Skin: Skin is warm and dry.  Psychiatric: He has a normal mood and affect. His behavior is normal. Judgment and thought content  normal.  Vitals reviewed.    ASSESSMENT:    1. S/P angioplasty with stent   2. Persistent atrial fibrillation ([place])   3. Essential (primary) hypertension   4. Hyperlipidemia, unspecified hyperlipidemia type   5. Stage 3b chronic kidney disease    PLAN:    In order of problems listed above:  CAD s/p DES to LAD 08/05/2019 -Patient is continued on Eliquis for atrial fibrillation and plan for Plavix x6 months, no aspirin.  Also on statin and beta-blocker. -Symptoms improved with stent placement.  No further shortness of breath or chest discomfort.  Patient is very active but no formal exercise.  I discussed brisk walking for 30 minutes a day.  Also advised on heart healthy Mediterranean diet. -Patient does not want to attend cardiac rehab phase 2. -I will have patient follow-up with Dr. Katharina Caper in 6 months.  He can review whether to continue Plavix at that time or discontinue.  Persistent atrial fibrillation -Rate controlled on beta-blocker and CCB.  Patient is tolerating it well. -On Eliquis for stroke risk reduction, now in addition to Plavix.  No unusual bleeding.  I discussed that if bleeding occurs she should call us.  Essential hypertension -On diltiazem 240 mg daily and metoprolol 50 mg twice daily -Blood pressure is currently well controlled.  Hyperlipidemia, Goal <70 -Lipid panel on  07/20/2019 showed a calculated LDL of 106 -Patient notes that his cholesterol has been much better when he was on atorvastatin but this was discontinued due to possible memory issues.  He says his memory improved after discontinuation.  He is now on rosuvastatin 10 mg and Zetia.  With the higher LDL, we will increase rosuvastatin to 20 mg daily and recheck lipid panel and LFTs in 6 weeks.  The patient agrees to this plan.  If the patient's LDL does not get to goal or if he does not tolerate the increased statin, would plan to refer to lipid clinic for consideration of PCSK9 inhibitor therapy.  CKD  stage IIIb -Serum creatinine was 1.65 in the hospital which was about his baseline of ~1.5. I will check a metabolic panel for renal function status post cardiac catheterization.   Medication Adjustments/Labs and Tests Ordered: Current medicines are reviewed at length with the patient today.  Concerns regarding medicines are outlined above. Labs and tests ordered and medication changes are outlined in the patient instructions below:  Patient Instructions  Medication Instructions:  INCREASE: Rosuvastatin (Crestor) to 20 mg once a day   Lab Work:  TODAY: BMET   FUTURE: Your physician recommends that you return for a FASTING lipid profile and Hepatic function on 10/19/2019 (Lab is open from 7:30 AM to 4:30 PM)  If you have labs (blood work) drawn today and your tests are completely normal, you will receive your results only by: Marland Kitchen MyChart Message (if you have MyChart) OR . A paper copy in the mail If you have any lab test that is abnormal or we need to change your treatment, we will call you to review the results.  Testing/Procedures: None   Follow-Up: At [facility], you and your health needs are our priority.  As part of our continuing mission to provide you with exceptional heart care, we have created designated Provider Care Teams.  These Care Teams include your primary Cardiologist (physician) and Advanced Practice Providers (APPs -  Physician Assistants and Nurse Practitioners) who all work together to provide you with the care you need, when you need it.  Your next appointment:   6 month(s)  The format for your next appointment:   In Person  Provider:   You may see Mertie Moores, MD or one of the following Advanced Practice Providers on your designated Care Team:    [name], PA-C  [place], [place]  Daune Perch, NP   Other Instructions  Lifestyle Modifications to Prevent and Treat Heart Disease -Recommend heart healthy/Mediterranean diet, with whole grains,  fruits, vegetables, fish, lean meats, nuts, olive oil and avocado oil.  -Limit salt intake to less than 2000 mg per day.  -Recommend moderate walking, starting slowly with a few minutes and working up to 3-5 times/week for 30-50 minutes each session. Aim for at least 150 minutes.week. Goal should be pace of 3 miles/hours, or walking 1.5 miles in 30 minutes -Recommend avoidance of tobacco products. Avoid excess alcohol. -Keep blood pressure well controlled, ideally less than 130/80.     Mediterranean Diet A Mediterranean diet refers to food and lifestyle choices that are based on the traditions of countries located on the [employer]. This way of eating has been shown to help prevent certain conditions and improve outcomes for people who have chronic diseases, like kidney disease and heart disease. What are tips for following this plan? Lifestyle  Cook and eat meals together with your family, when possible.  Drink enough  fluid to keep your urine clear or pale yellow.  Be physically active every day. This includes: ? Aerobic exercise like running or swimming. ? Leisure activities like gardening, walking, or housework.  Get 7-8 hours of sleep each night.  If recommended by your health care provider, drink red wine in moderation. This means 1 glass a day for nonpregnant women and 2 glasses a day for men. A glass of wine equals 5 oz (150 mL). Reading food labels   Check the serving size of packaged foods. For foods such as rice and pasta, the serving size refers to the amount of cooked product, not dry.  Check the total fat in packaged foods. Avoid foods that have saturated fat or trans fats.  Check the ingredients list for added sugars, such as corn syrup. Shopping  At the grocery store, buy most of your food from the areas near the walls of the store. This includes: ? Fresh fruits and vegetables (produce). ? Grains, beans, nuts, and seeds. Some of these may be available in  unpackaged forms or large amounts (in bulk). ? Fresh seafood. ? Poultry and eggs. ? Low-fat dairy products.  Buy whole ingredients instead of prepackaged foods.  Buy fresh fruits and vegetables in-season from local farmers markets.  Buy frozen fruits and vegetables in resealable bags.  If you do not have access to quality fresh seafood, buy precooked frozen shrimp or canned fish, such as tuna, salmon, or sardines.  Buy small amounts of raw or cooked vegetables, salads, or olives from the deli or salad bar at your store.  Stock your pantry so you always have certain foods on hand, such as olive oil, canned tuna, canned tomatoes, rice, pasta, and beans. Cooking  Cook foods with extra-virgin olive oil instead of using butter or other vegetable oils.  Have meat as a side dish, and have vegetables or grains as your main dish. This means having meat in small portions or adding small amounts of meat to foods like pasta or stew.  Use beans or vegetables instead of meat in common dishes like chili or lasagna.  Experiment with different cooking methods. Try roasting or broiling vegetables instead of steaming or sauteing them.  Add frozen vegetables to soups, stews, pasta, or rice.  Add nuts or seeds for added healthy fat at each meal. You can add these to yogurt, salads, or vegetable dishes.  Marinate fish or vegetables using olive oil, lemon juice, garlic, and fresh herbs. Meal planning   Plan to eat 1 vegetarian meal one day each week. Try to work up to 2 vegetarian meals, if possible.  Eat seafood 2 or more times a week.  Have healthy snacks readily available, such as: ? Vegetable sticks with hummus. ? [place] yogurt. ? Fruit and nut trail mix.  Eat balanced meals throughout the week. This includes: ? Fruit: 2-3 servings a day ? Vegetables: 4-5 servings a day ? Low-fat dairy: 2 servings a day ? Fish, poultry, or lean meat: 1 serving a day ? Beans and legumes: 2 or more  servings a week ? Nuts and seeds: 1-2 servings a day ? Whole grains: 6-8 servings a day ? Extra-virgin olive oil: 3-4 servings a day  Limit red meat and sweets to only a few servings a month What are my food choices?  Mediterranean diet ? Recommended  Grains: Whole-grain pasta. Brown rice. Bulgar wheat. Polenta. Couscous. Whole-wheat bread. [place].  Vegetables: Artichokes. Beets. Broccoli. Cabbage. Carrots. Eggplant. Green beans. Chard. Kale.  Spinach. Onions. Leeks. Peas. Squash. Tomatoes. Peppers. Radishes.  Fruits: Apples. Apricots. Avocado. Berries. Bananas. Cherries. Dates. Figs. Grapes. Lemons. Melon. Oranges. Peaches. Plums. Pomegranate.  Meats and other protein foods: Beans. Almonds. Sunflower seeds. Pine nuts. Peanuts. [place]. Salmon. Scallops. Shrimp. [place]. Tilapia. Clams. Oysters. Eggs.  Dairy: Low-fat milk. Cheese. Greek yogurt.  Beverages: Water. Red wine. Herbal tea.  Fats and oils: Extra virgin olive oil. Avocado oil. Grape seed oil.  Sweets and desserts: [place] yogurt with honey. Baked apples. Poached pears. Trail mix.  Seasoning and other foods: Basil. Cilantro. Coriander. Cumin. Mint. Parsley. Sage. Rosemary. Tarragon. Garlic. Oregano. Thyme. Pepper. Balsalmic vinegar. Tahini. Hummus. Tomato sauce. Olives. Mushrooms. ? Limit these  Grains: Prepackaged pasta or rice dishes. Prepackaged cereal with added sugar.  Vegetables: Deep fried potatoes (french fries).  Fruits: Fruit canned in syrup.  Meats and other protein foods: Beef. Pork. Lamb. Poultry with skin. Hot dogs. Berniece Salines.  Dairy: Ice cream. Sour cream. Whole milk.  Beverages: Juice. Sugar-sweetened soft drinks. Beer. Liquor and spirits.  Fats and oils: Butter. Canola oil. Vegetable oil. Beef fat (tallow). Lard.  Sweets and desserts: Cookies. Cakes. Pies. Candy.  Seasoning and other foods: Mayonnaise. Premade sauces and marinades. The items listed may not be a complete list. Talk with your  dietitian about what dietary choices are right for you. Summary  The Mediterranean diet includes both food and lifestyle choices.  Eat a variety of fresh fruits and vegetables, beans, nuts, seeds, and whole grains.  Limit the amount of red meat and sweets that you eat.  Talk with your health care provider about whether it is safe for you to drink red wine in moderation. This means 1 glass a day for nonpregnant women and 2 glasses a day for men. A glass of wine equals 5 oz (150 mL). This information is not intended to replace advice given to you by your health care provider. Make sure you discuss any questions you have with your health care provider. Document Released: 05/15/2016 Document Revised: 05/22/2016 Document Reviewed: 05/15/2016 Elsevier Patient Education  2020 [place], Daune Perch, NP  08/24/2019 1:24 PM    [facility]

## 2019-08-24 NOTE — Patient Instructions (Addendum)
Medication Instructions:  INCREASE: Rosuvastatin (Crestor) to 20 mg once a day   Lab Work:  TODAY: BMET   FUTURE: Your physician recommends that you return for a FASTING lipid profile and Hepatic function on 10/19/2019 (Lab is open from 7:30 AM to 4:30 PM)  If you have labs (blood work) drawn today and your tests are completely normal, you will receive your results only by: Marland Kitchen MyChart Message (if you have MyChart) OR . A paper copy in the mail If you have any lab test that is abnormal or we need to change your treatment, we will call you to review the results.  Testing/Procedures: None   Follow-Up: At [facility], you and your health needs are our priority.  As part of our continuing mission to provide you with exceptional heart care, we have created designated Provider Care Teams.  These Care Teams include your primary Cardiologist (physician) and Advanced Practice Providers (APPs -  Physician Assistants and Nurse Practitioners) who all work together to provide you with the care you need, when you need it.  Your next appointment:   6 month(s)  The format for your next appointment:   In Person  Provider:   You may see Mertie Moores, MD or one of the following Advanced Practice Providers on your designated Care Team:    [name], PA-C  [place], [place]  Daune Perch, NP   Other Instructions  Lifestyle Modifications to Prevent and Treat Heart Disease -Recommend heart healthy/Mediterranean diet, with whole grains, fruits, vegetables, fish, lean meats, nuts, olive oil and avocado oil.  -Limit salt intake to less than 2000 mg per day.  -Recommend moderate walking, starting slowly with a few minutes and working up to 3-5 times/week for 30-50 minutes each session. Aim for at least 150 minutes.week. Goal should be pace of 3 miles/hours, or walking 1.5 miles in 30 minutes -Recommend avoidance of tobacco products. Avoid excess alcohol. -Keep blood pressure well controlled,  ideally less than 130/80.     Mediterranean Diet A Mediterranean diet refers to food and lifestyle choices that are based on the traditions of countries located on the [employer]. This way of eating has been shown to help prevent certain conditions and improve outcomes for people who have chronic diseases, like kidney disease and heart disease. What are tips for following this plan? Lifestyle  Cook and eat meals together with your family, when possible.  Drink enough fluid to keep your urine clear or pale yellow.  Be physically active every day. This includes: ? Aerobic exercise like running or swimming. ? Leisure activities like gardening, walking, or housework.  Get 7-8 hours of sleep each night.  If recommended by your health care provider, drink red wine in moderation. This means 1 glass a day for nonpregnant women and 2 glasses a day for men. A glass of wine equals 5 oz (150 mL). Reading food labels   Check the serving size of packaged foods. For foods such as rice and pasta, the serving size refers to the amount of cooked product, not dry.  Check the total fat in packaged foods. Avoid foods that have saturated fat or trans fats.  Check the ingredients list for added sugars, such as corn syrup. Shopping  At the grocery store, buy most of your food from the areas near the walls of the store. This includes: ? Fresh fruits and vegetables (produce). ? Grains, beans, nuts, and seeds. Some of these may be available in unpackaged forms or large  amounts (in bulk). ? Fresh seafood. ? Poultry and eggs. ? Low-fat dairy products.  Buy whole ingredients instead of prepackaged foods.  Buy fresh fruits and vegetables in-season from local farmers markets.  Buy frozen fruits and vegetables in resealable bags.  If you do not have access to quality fresh seafood, buy precooked frozen shrimp or canned fish, such as tuna, salmon, or sardines.  Buy small amounts of raw or cooked  vegetables, salads, or olives from the deli or salad bar at your store.  Stock your pantry so you always have certain foods on hand, such as olive oil, canned tuna, canned tomatoes, rice, pasta, and beans. Cooking  Cook foods with extra-virgin olive oil instead of using butter or other vegetable oils.  Have meat as a side dish, and have vegetables or grains as your main dish. This means having meat in small portions or adding small amounts of meat to foods like pasta or stew.  Use beans or vegetables instead of meat in common dishes like chili or lasagna.  Experiment with different cooking methods. Try roasting or broiling vegetables instead of steaming or sauteing them.  Add frozen vegetables to soups, stews, pasta, or rice.  Add nuts or seeds for added healthy fat at each meal. You can add these to yogurt, salads, or vegetable dishes.  Marinate fish or vegetables using olive oil, lemon juice, garlic, and fresh herbs. Meal planning   Plan to eat 1 vegetarian meal one day each week. Try to work up to 2 vegetarian meals, if possible.  Eat seafood 2 or more times a week.  Have healthy snacks readily available, such as: ? Vegetable sticks with hummus. ? [place] yogurt. ? Fruit and nut trail mix.  Eat balanced meals throughout the week. This includes: ? Fruit: 2-3 servings a day ? Vegetables: 4-5 servings a day ? Low-fat dairy: 2 servings a day ? Fish, poultry, or lean meat: 1 serving a day ? Beans and legumes: 2 or more servings a week ? Nuts and seeds: 1-2 servings a day ? Whole grains: 6-8 servings a day ? Extra-virgin olive oil: 3-4 servings a day  Limit red meat and sweets to only a few servings a month What are my food choices?  Mediterranean diet ? Recommended  Grains: Whole-grain pasta. Brown rice. Bulgar wheat. Polenta. Couscous. Whole-wheat bread. [place].  Vegetables: Artichokes. Beets. Broccoli. Cabbage. Carrots. Eggplant. Green beans. Chard. Kale.  Spinach. Onions. Leeks. Peas. Squash. Tomatoes. Peppers. Radishes.  Fruits: Apples. Apricots. Avocado. Berries. Bananas. Cherries. Dates. Figs. Grapes. Lemons. Melon. Oranges. Peaches. Plums. Pomegranate.  Meats and other protein foods: Beans. Almonds. Sunflower seeds. Pine nuts. Peanuts. [place]. Salmon. Scallops. Shrimp. [place]. Tilapia. Clams. Oysters. Eggs.  Dairy: Low-fat milk. Cheese. Greek yogurt.  Beverages: Water. Red wine. Herbal tea.  Fats and oils: Extra virgin olive oil. Avocado oil. Grape seed oil.  Sweets and desserts: [place] yogurt with honey. Baked apples. Poached pears. Trail mix.  Seasoning and other foods: Basil. Cilantro. Coriander. Cumin. Mint. Parsley. Sage. Rosemary. Tarragon. Garlic. Oregano. Thyme. Pepper. Balsalmic vinegar. Tahini. Hummus. Tomato sauce. Olives. Mushrooms. ? Limit these  Grains: Prepackaged pasta or rice dishes. Prepackaged cereal with added sugar.  Vegetables: Deep fried potatoes (french fries).  Fruits: Fruit canned in syrup.  Meats and other protein foods: Beef. Pork. Lamb. Poultry with skin. Hot dogs. Berniece Salines.  Dairy: Ice cream. Sour cream. Whole milk.  Beverages: Juice. Sugar-sweetened soft drinks. Beer. Liquor and spirits.  Fats and oils: Butter. Canola oil. Vegetable  oil. Beef fat (tallow). Lard.  Sweets and desserts: Cookies. Cakes. Pies. Candy.  Seasoning and other foods: Mayonnaise. Premade sauces and marinades. The items listed may not be a complete list. Talk with your dietitian about what dietary choices are right for you. Summary  The Mediterranean diet includes both food and lifestyle choices.  Eat a variety of fresh fruits and vegetables, beans, nuts, seeds, and whole grains.  Limit the amount of red meat and sweets that you eat.  Talk with your health care provider about whether it is safe for you to drink red wine in moderation. This means 1 glass a day for nonpregnant women and 2 glasses a day for men. A glass of wine  equals 5 oz (150 mL). This information is not intended to replace advice given to you by your health care provider. Make sure you discuss any questions you have with your health care provider. Document Released: 05/15/2016 Document Revised: 05/22/2016 Document Reviewed: 05/15/2016 Elsevier Patient Education  2020 [employer].

## 2019-08-25 LAB — BASIC METABOLIC PANEL
BUN/Creatinine Ratio: 13 (ref 10–24)
BUN: 22 mg/dL (ref 8–27)
CO2: 24 mmol/L (ref 20–29)
Calcium: 9.2 mg/dL (ref 8.6–10.2)
Chloride: 104 mmol/L (ref 96–106)
Creatinine, Ser: 1.63 mg/dL — ABNORMAL HIGH (ref 0.76–1.27)
GFR calc Af Amer: 47 mL/min/{1.73_m2} — ABNORMAL LOW (ref >59)
GFR calc non Af Amer: 41 mL/min/{1.73_m2} — ABNORMAL LOW (ref >59)
Glucose: 57 mg/dL — ABNORMAL LOW (ref 65–99)
Potassium: 4.4 mmol/L (ref 3.5–5.2)
Sodium: 141 mmol/L (ref 134–144)

## 2019-08-31 ENCOUNTER — Telehealth: Payer: Self-pay | Admitting: Cardiovascular Disease

## 2019-08-31 NOTE — Telephone Encounter (Signed)
Pt is on Eliquis

## 2019-08-31 NOTE — Telephone Encounter (Signed)
New message    Patient calling to report nose bleeds that started on 11/24 No other symptoms

## 2019-08-31 NOTE — Telephone Encounter (Signed)
Spoke with patient who states he had nose bleeding that started yesterday. States nose bleed that occurred today has stopped now but if he irritates it, bleeding will reoccur. I asked if he has ever seen an ENT and he denies. I asked him to call Dr. Quay Burow for an appointment or a referral to ENT. I advised per Dr. Acie Fredrickson, he may hold Eliquis for 2 days. He advised that if bleeding does not stop after holding 2 days, he may hold 2 more for a total of 4 max. I advised patient he may call our office over the holiday to reach the on-call provider if he has further questions or concerns. He verbalized understanding and agreement and thanked me for the call.

## 2019-09-12 ENCOUNTER — Ambulatory Visit (INDEPENDENT_AMBULATORY_CARE_PROVIDER_SITE_OTHER): Payer: Medicare HMO | Admitting: Internal Medicine

## 2019-09-12 ENCOUNTER — Encounter: Payer: Self-pay | Admitting: Internal Medicine

## 2019-09-12 ENCOUNTER — Other Ambulatory Visit: Payer: Self-pay

## 2019-09-12 ENCOUNTER — Telehealth: Payer: Self-pay | Admitting: Internal Medicine

## 2019-09-12 VITALS — BP 142/80 | HR 77 | Temp 98.2°F | Resp 16 | Ht 76.0 in | Wt 218.0 lb

## 2019-09-12 DIAGNOSIS — A46 Erysipelas: Secondary | ICD-10-CM | POA: Diagnosis not present

## 2019-09-12 MED ORDER — CEFTRIAXONE SODIUM 1 G IJ SOLR
1.0000 g | Freq: Once | INTRAMUSCULAR | Status: AC
  Administered 2019-09-12: 11:00:00 1 g via INTRAMUSCULAR

## 2019-09-12 MED ORDER — PENICILLIN V POTASSIUM 500 MG PO TABS: 500.0000 mg | ORAL_TABLET | Freq: Four times a day (QID) | ORAL | 0 refills | Status: AC

## 2019-09-12 NOTE — Patient Instructions (Addendum)
You received an antibiotic injection today.  Take the oral antibiotic as prescribed and complete the entire prescription.    Please call if there is no improvement in your symptoms.      Erysipelas  Erysipelas is an infection that affects the skin and tissues near the surface of the skin. It causes the skin to become red, swollen, and painful. The infection is most common on the legs but may also affect other areas, such as the face. With treatment, the infection usually goes away in a few days. If not treated, the infection can spread or lead to other problems, such as collections of pus (abscesses). What are the causes? This condition is caused by bacteria. Most often, it is caused by bacteria called streptococci.  Bacteria may get into the skin through a break in the skin, such as:  A cut or scrape.  An incision from surgery.  A burn.  An insect bite.  An open sore.  A crack in the skin. Sometimes, how the bacteria infected the skin is not known. What increases the risk? You are more likely to develop this condition if you:  Are a young child.  Are older. Elderly people are more likely to get erysipelas.  Have a weakened disease-fighting system (immune system), such as if you have HIV or AIDS.  Have diabetes.  Drink a lot of alcohol.  Recently had surgery.  Have a yeast infection of the skin.  Have swollen legs. What are the signs or symptoms? The infection causes a reddened area on the skin. This reddened area may:  Be painful and swollen.  Have a clear border around it.  Feel itchy and hot.  Develop blisters or abscesses. Other symptoms may include:  Fever.  Chills.  Nausea and vomiting.  Swollen glands (lymph nodes), such as in the neck.  Headache.  Fatigue.  Loss of appetite. How is this diagnosed? This condition is diagnosed based on:  A physical exam. Your health care provider will examine your skin closely.  Your symptoms and  medical history. How is this treated?  This condition is treated with antibiotic medicine. Symptoms usually get better within a few days after starting antibiotics. Follow these instructions at home: Medicines  Take your antibiotic medicine as told by your health care provider. Do not stop taking the antibiotic even if your condition starts to improve.  Take other over-the-counter and prescription medicines only as told by your health care provider.  Ask your health care provider if it is safe for you to take medicines for pain as needed, such as acetaminophen or ibuprofen. General instructions  If the affected area is on an arm or leg, raise (elevate) the affected arm or leg above the level of your heart while you are sitting or lying down.  Do not put any creams or lotions on the affected area of your skin unless your health care provider tells you to do that.  Do not share bedding, towels, or washcloths (linens) with other people. Use only your own linens to prevent the infection from spreading to others.  Keep all follow-up visits as told by your health care provider. This is important. Contact a health care provider if:  Your symptoms do not improve within 1-2 days of starting treatment.  You develop new symptoms.  You have a fever.  You have a general ill feeling (malaise) with muscle aches and pains. Get help right away if:  Your symptoms get worse.  You develop vomiting  or diarrhea that persists.  Your red area gets larger or turns dark in color.  You notice red streaks coming from the infected area. Summary  Erysipelas is an infection that affects the skin and tissues near the surface of the skin. It causes the skin to become red, swollen, and painful.  This condition is caused by bacteria. Most often, it is caused by bacteria called streptococci.  Bacteria may get into the skin through a break in the skin. Sometimes, how the bacteria infected the skin is not  known.  This condition is treated with antibiotic medicine. Symptoms usually get better within a few days after starting antibiotics. This information is not intended to replace advice given to you by your health care provider. Make sure you discuss any questions you have with your health care provider. Document Released: 06/17/2001 Document Revised: 09/15/2017 Document Reviewed: 09/15/2017 Elsevier Patient Education  2020 [employer].

## 2019-09-12 NOTE — Telephone Encounter (Signed)
Do you want him to go ahead and start it today or wait until tomorrow because he got that shot? Please advise on what to take for discomfort.

## 2019-09-12 NOTE — Progress Notes (Signed)
Subjective:    Patient ID: [name], male    DOB: 14-Jul-1946, 73 y.o.   MRN: [id number]  HPI The patient is here for an acute visit.   Yesterday he woke up with redness on the left side of his nose and cheek.  It became swollen as the day progressed. The area is tender and warm.  He has tenderness on his left orbit bone.  he denies any trauma or injury to the area.  He denies any fever or chills.     He has had facial cellulitis more than once in the past.  He is unsure why this episode came about, but it is similar to the ones in the past.  He responded well to oral antibiotics in the past.  He has a slight scab on the left nare, but denies any injury there.  He feels it is related to a cream he put on last night.  Estimated Creatinine Clearance: 48.1 mL/min (A) (by C-G formula based on SCr of 1.63 mg/dL (H)).   Medications and allergies reviewed with patient and updated if appropriate.  Patient Active Problem List   Diagnosis Date Noted  . Erysipelas 09/12/2019  . Progressive angina ([place]) 08/04/2019  . Gouty arthritis of left great toe 08/02/2019  . Persistent atrial fibrillation ([place]), rapid ventricular response 12/03/2018  . Umbilical hernia without obstruction or gangrene 09/14/2018  . B12 deficiency 09/14/2018  . Erectile dysfunction 08/04/2016  . Elevated TSH 08/29/2015  . S/P coronary artery stent placement, 01/19/15 S/p DES of mid RCA 01/20/2015  . Unstable angina ([place])   . Prediabetes 10/03/2014  . TIA (transient ischemic attack) 07/31/2014  . Cerebral infarction ([place]) 06/05/2014  . NSTEMI (non-ST elevated myocardial infarction) ([place]) 06/04/2014  . Chronic kidney disease (CKD), stage III (moderate) 06/14/2010  . Coronary artery disease involving native coronary artery of native heart without angina pectoris 03/01/2010  . TREMOR, ESSENTIAL, RIGHT HAND 04/24/2009  . PROSTATE CANCER, HX OF 04/24/2009  . Melanoma ([place]) 04/24/2009  . Hyperlipidemia LDL goal <70  10/28/2007  . Essential hypertension 10/13/2007    Current Outpatient Medications on File Prior to Visit  Medication Sig Dispense Refill  . allopurinol (ZYLOPRIM) 100 MG tablet Take 1 tablet (100 mg total) by mouth daily. 30 tablet 6  . apixaban (ELIQUIS) 5 MG TABS tablet Take 1 tablet (5 mg total) by mouth 2 (two) times daily. 60 tablet 11  . calcium carbonate (TUMS - DOSED IN MG ELEMENTAL CALCIUM) 500 MG chewable tablet Chew 2 tablets by mouth daily as needed for indigestion or heartburn.     . clopidogrel (PLAVIX) 75 MG tablet Take 1 tablet (75 mg total) by mouth daily with breakfast. 30 tablet 6  . Cyanocobalamin (VITAMIN B12 PO) Take 1 tablet by mouth every evening.    . diltiazem (CARDIZEM CD) 240 MG 24 hr capsule Take 1 capsule (240 mg total) by mouth daily. 90 capsule 3  . fish oil-omega-3 fatty acids 1000 MG capsule Take 2 g by mouth 2 (two) times daily.      . metoprolol tartrate (LOPRESSOR) 50 MG tablet Take 1 tablet (50 mg total) by mouth 2 (two) times daily. 60 tablet 6  . Misc Natural Products (TART CHERRY ADVANCED PO) Take 1,000 mg by mouth daily.    . nitroGLYCERIN (NITROSTAT) 0.4 MG SL tablet Place 1 tablet (0.4 mg total) under the tongue every 5 (five) minutes x 3 doses as needed for chest pain. 25 tablet 4  .  Potassium Gluconate 2 MEQ TABS Take 1 tablet by mouth daily.    . rosuvastatin (CRESTOR) 20 MG tablet Take 1 tablet (20 mg total) by mouth daily. 90 tablet 3  . ezetimibe (ZETIA) 10 MG tablet Take 1 tablet (10 mg total) by mouth daily. 30 tablet 6   No current facility-administered medications on file prior to visit.     Past Medical History:  Diagnosis Date  . Anemia, mild   . CAD (coronary artery disease)    a. 02/2010 NSTEMI/PCI: PROMUS DES to mRCA. b. 06/05/14 Cath/PCI: pLAD 70%--> s/p PCI/DES (Promus DES);  c. 01/2015 Cath/PCI: LM nl, LAD 30m, patent prox stent, LCX patent prox stent, RCA 30p, 35m (3.5x20 Synergy DES). // Myoview 07/2019:  EF 48, atten artifact,  no ischemia, intermediate risk (low EF)   . CKD (chronic kidney disease), stage III   . Diastolic dysfunction    a. Echo 6/11: mild LVH, EF 55-60%, GR1DD, Trivial MR, mild LAE. b. echo 06/04/14: EF 55-60%, no WMA, GR1DD, Ao valve mildly calcifed, mild MR, mild LAE  . History of pulmonary embolism   . Hyperlipidemia   . Hypertension    did not tolerate Lisinopril event at low dose  . Melanoma ([place])    a. 1987. b. 2012  . Myocardial infarction ([place])   . Persistent atrial fibrillation ([place]) 12/03/2018   Dx 11/2018 >> Apixaban started // Echo 12/2018: EF 55-60, mod RVE, normal RVSF, mild LAE, trivial MR, mild TR, mod AoV sclerosis   . Prostate cancer ([place])    Dr Karsten Ro  . Stroke ([place])   . TIA (transient ischemic attack)    a. post cath and PCI on 06/05/2014 - no residual sequelae.  . Vitamin B12 deficiency     Past Surgical History:  Procedure Laterality Date  . CHOLECYSTECTOMY    . COLONOSCOPY  2006  . CORONARY STENT INTERVENTION N/A 08/05/2019   Procedure: CORONARY STENT INTERVENTION;  Surgeon: Jettie Booze, MD;  Location: [facility];  Service: Cardiovascular;  Laterality: N/A;  . KNEE ARTHROSCOPY     right  . LEFT HEART CATH AND CORONARY ANGIOGRAPHY N/A 08/05/2019   Procedure: LEFT HEART CATH AND CORONARY ANGIOGRAPHY;  Surgeon: Jettie Booze, MD;  Location: [facility];  Service: Cardiovascular;  Laterality: N/A;  . LEFT HEART CATHETERIZATION WITH CORONARY ANGIOGRAM N/A 06/05/2014   Procedure: LEFT HEART CATHETERIZATION WITH CORONARY ANGIOGRAM;  Surgeon: Blane Ohara, MD;  Location: [facility] CATH LAB;  Service: Cardiovascular;  Laterality: N/A;  . LEFT HEART CATHETERIZATION WITH CORONARY ANGIOGRAM N/A 01/19/2015   Procedure: LEFT HEART CATHETERIZATION WITH CORONARY ANGIOGRAM;  Surgeon: Peter M Martinique, MD;  Location: [facility] CATH LAB;  Service: Cardiovascular;  Laterality: N/A;  . MELANOMA EXCISION  2011    L chest  . MELANOMA EXCISION  1991   back  . mRCA stent   02/2010  . PROSTATECTOMY     Dr. Karsten Ro  . TONSILLECTOMY      Social History   Socioeconomic History  . Marital status: Married    Spouse name: Not on file  . Number of children: Not on file  . Years of education: Not on file  . Highest education level: Not on file  Occupational History  . Not on file  Social Needs  . Financial resource strain: Not on file  . Food insecurity    Worry: Not on file    Inability: Not on file  . Transportation needs    Medical:  Not on file    Non-medical: Not on file  Tobacco Use  . Smoking status: Never Smoker  . Smokeless tobacco: Never Used  Substance and Sexual Activity  . Alcohol use: No    Alcohol/week: 0.0 standard drinks  . Drug use: No  . Sexual activity: Not on file  Lifestyle  . Physical activity    Days per week: Not on file    Minutes per session: Not on file  . Stress: Not on file  Relationships  . Social Herbalist on phone: Not on file    Gets together: Not on file    Attends religious service: Not on file    Active member of club or organization: Not on file    Attends meetings of clubs or organizations: Not on file    Relationship status: Not on file  Other Topics Concern  . Not on file  Social History Narrative  . Not on file    Family History  Problem Relation Age of Onset  . Coronary artery disease Mother        S/P CABG, in her 8s  . Transient ischemic attack Mother 39  . Heart disease Mother   . Prostate cancer Father   . Hypertension Brother   . Diabetes Sister     Review of Systems  Constitutional: Negative for chills and fever.  HENT: Positive for facial swelling. Negative for ear pain.   Eyes: Negative for pain and visual disturbance.  Neurological: Negative for light-headedness, numbness and headaches.       Objective:   Vitals:   09/12/19 0915  BP: (!) 142/80  Pulse: 77  Resp: 16  Temp: 98.2 F (36.8 C)  SpO2: 99%   BP Readings from Last 3 Encounters:  09/12/19 (!)  142/80  08/24/19 120/70  08/06/19 110/70   Wt Readings from Last 3 Encounters:  09/12/19 218 lb (98.9 kg)  08/24/19 211 lb (95.7 kg)  08/06/19 204 lb 4.8 oz (92.7 kg)   Body mass index is 26.54 kg/m.   Physical Exam Constitutional:      General: He is not in acute distress.    Appearance: Normal appearance. He is not ill-appearing, toxic-appearing or diaphoretic.  HENT:     Head: Normocephalic and atraumatic.     Right Ear: Tympanic membrane, ear canal and external ear normal.     Left Ear: Tympanic membrane, ear canal and external ear normal.     Nose:     Comments: Mild-mmoderate swelling inside left nostril on lateral aspect    Mouth/Throat:     Mouth: Mucous membranes are moist.     Pharynx: No oropharyngeal exudate or posterior oropharyngeal erythema.  Neck:     Musculoskeletal: Neck supple. No muscular tenderness.  Lymphadenopathy:     Cervical: No cervical adenopathy.  Skin:    Findings: Erythema (entire nose - Left side more than right side, erythema extends into left cheek, scab on left nare - he thinks this may be from a cream he applied last night - denies trama) and lesion (yellow scab on left nostril ) present.  Neurological:     Mental Status: He is alert.            Assessment & Plan:    See Problem List for Assessment and Plan of chronic medical problems.

## 2019-09-12 NOTE — Assessment & Plan Note (Signed)
Nose and left cheek.  No obvious cause Has had erysipelas at least twice in the past Ceftriaxone 1 g IM x1 Start penicillin 500 mg 4 times daily-this has been very effective for him in the past Discussed that he needs to monitor this closely and that this can be a very serious infection Advised him to call with any questions or concerns and if his symptoms are worsening or not improving over the next 48 hours

## 2019-09-12 NOTE — Telephone Encounter (Signed)
Start antibiotic today - can get in two doses.   Can take tylenol for pain.  If he does not think this will be enough I can prescribe tramadol for a few days. Let me know

## 2019-09-12 NOTE — Telephone Encounter (Signed)
Patient wife is calling in to know when patient can start taking medication that was prescribed today and also what can he take for discomfort   Please advise

## 2019-09-12 NOTE — Telephone Encounter (Signed)
Wife aware of response. States tylenol should be fine.

## 2019-09-13 ENCOUNTER — Ambulatory Visit: Payer: Medicare HMO | Admitting: Physician Assistant

## 2019-09-25 ENCOUNTER — Other Ambulatory Visit: Payer: Self-pay | Admitting: Physician Assistant

## 2019-10-19 ENCOUNTER — Other Ambulatory Visit: Payer: Self-pay

## 2019-10-19 ENCOUNTER — Other Ambulatory Visit: Payer: Medicare HMO | Admitting: *Deleted

## 2019-10-19 ENCOUNTER — Encounter (INDEPENDENT_AMBULATORY_CARE_PROVIDER_SITE_OTHER): Payer: Self-pay

## 2019-10-19 DIAGNOSIS — I2510 Atherosclerotic heart disease of native coronary artery without angina pectoris: Secondary | ICD-10-CM

## 2019-10-19 DIAGNOSIS — E785 Hyperlipidemia, unspecified: Secondary | ICD-10-CM | POA: Diagnosis not present

## 2019-10-19 LAB — LIPID PANEL
Chol/HDL Ratio: 2.4 ratio (ref 0.0–5.0)
Cholesterol, Total: 99 mg/dL — ABNORMAL LOW (ref 100–199)
HDL: 41 mg/dL (ref >39)
LDL Chol Calc (NIH): 36 mg/dL (ref 0–99)
Triglycerides: 120 mg/dL (ref 0–149)
VLDL Cholesterol Cal: 22 mg/dL (ref 5–40)

## 2019-10-19 LAB — HEPATIC FUNCTION PANEL
ALT: 31 IU/L (ref 0–44)
AST: 33 IU/L (ref 0–40)
Albumin: 4.4 g/dL (ref 3.7–4.7)
Alkaline Phosphatase: 75 IU/L (ref 39–117)
Bilirubin Total: 0.4 mg/dL (ref 0.0–1.2)
Bilirubin, Direct: 0.13 mg/dL (ref 0.00–0.40)
Total Protein: 6.5 g/dL (ref 6.0–8.5)

## 2019-10-25 ENCOUNTER — Other Ambulatory Visit: Payer: Self-pay | Admitting: Physician Assistant

## 2019-10-26 NOTE — Telephone Encounter (Signed)
Pt last saw Daune Perch, NP on 08/24/19, last labs 08/24/19 Creat 1.63, age 74, weight 98.9kg, based on specified criteria pt is on appropriate dosage of Eliquis 5mg  BID.  Will refill rx.

## 2019-11-03 ENCOUNTER — Other Ambulatory Visit: Payer: Self-pay | Admitting: Internal Medicine

## 2019-12-02 DIAGNOSIS — D1801 Hemangioma of skin and subcutaneous tissue: Secondary | ICD-10-CM | POA: Diagnosis not present

## 2019-12-02 DIAGNOSIS — L814 Other melanin hyperpigmentation: Secondary | ICD-10-CM | POA: Diagnosis not present

## 2019-12-02 DIAGNOSIS — L918 Other hypertrophic disorders of the skin: Secondary | ICD-10-CM | POA: Diagnosis not present

## 2019-12-02 DIAGNOSIS — Z85820 Personal history of malignant melanoma of skin: Secondary | ICD-10-CM | POA: Diagnosis not present

## 2019-12-02 DIAGNOSIS — L570 Actinic keratosis: Secondary | ICD-10-CM | POA: Diagnosis not present

## 2019-12-02 DIAGNOSIS — D225 Melanocytic nevi of trunk: Secondary | ICD-10-CM | POA: Diagnosis not present

## 2019-12-02 DIAGNOSIS — L821 Other seborrheic keratosis: Secondary | ICD-10-CM | POA: Diagnosis not present

## 2019-12-02 DIAGNOSIS — D692 Other nonthrombocytopenic purpura: Secondary | ICD-10-CM | POA: Diagnosis not present

## 2019-12-08 ENCOUNTER — Ambulatory Visit: Payer: Medicare HMO | Attending: Internal Medicine

## 2019-12-08 DIAGNOSIS — Z23 Encounter for immunization: Secondary | ICD-10-CM | POA: Insufficient documentation

## 2019-12-08 NOTE — Progress Notes (Signed)
   Covid-19 Vaccination Clinic  Name:  [name]    MRN: [id number] DOB: [birth year]  12/08/2019  [name] was observed post Covid-19 immunization for 15 minutes without incident. He was provided with Vaccine Information Sheet and instruction to access the V-Safe system.   [name] was instructed to call 911 with any severe reactions post vaccine: Marland Kitchen Difficulty breathing  . Swelling of face and throat  . A fast heartbeat  . A bad rash all over body  . Dizziness and weakness   Immunizations Administered    Name Date Dose VIS Date Route   Pfizer COVID-19 Vaccine 12/08/2019 12:49 PM 0.3 mL 09/16/2019 Intramuscular   Manufacturer: [place]   Lot: [id number]   [place]: [id number]

## 2020-01-03 ENCOUNTER — Ambulatory Visit: Payer: Medicare HMO | Attending: Internal Medicine

## 2020-01-03 DIAGNOSIS — Z23 Encounter for immunization: Secondary | ICD-10-CM

## 2020-01-03 NOTE — Progress Notes (Signed)
   Covid-19 Vaccination Clinic  Name:  [name]    MRN: [id number] DOB: 1946-01-19  01/03/2020  [name] was observed post Covid-19 immunization for 15 minutes without incident. He was provided with Vaccine Information Sheet and instruction to access the V-Safe system.   [name] was instructed to call 911 with any severe reactions post vaccine: Marland Kitchen Difficulty breathing  . Swelling of face and throat  . A fast heartbeat  . A bad rash all over body  . Dizziness and weakness   Immunizations Administered    Name Date Dose VIS Date Route   Pfizer COVID-19 Vaccine 01/03/2020  3:05 PM 0.3 mL 09/16/2019 Intramuscular   Manufacturer: [employer]   Lot: [id number]   [place]: [id number]

## 2020-01-10 ENCOUNTER — Other Ambulatory Visit: Payer: Self-pay | Admitting: Cardiology

## 2020-01-26 NOTE — Progress Notes (Signed)
Subjective:    Patient ID: [name], male    DOB: 07-12-1946, 74 y.o.   MRN: [id number]  HPI The patient is here for an acute visit.   Side pain:  The pain started one month ago.   There was no injury or obvious cause.  It was intermittent when it first started.  Two days ago it hurt bad.  He is unsure if it got worse after doing something in the yard.  Yesterday and last night it was constant. This morning it hurt when he got up, but it is not too bad right now.    Yesterday when he was walking it hurt whne his heel hit the ground.  He also noticed that turning toward his left at the waist increased the pain.    He denies any muscle spasms, radiation into the leg, numbness and tingling.  He has no history of kidney stones.  He did not take anything for the pain.  First thing in the morning sometimes he notices that his urine is more orange in color.  He does try to drink a lot of fluids and that helps and then his urine looks more clear.  He denies any blood in the urine.  He denies any abdominal pain or fevers.    Medications and allergies reviewed with patient and updated if appropriate.  Patient Active Problem List   Diagnosis Date Noted  . Erysipelas 09/12/2019  . Progressive angina ([place]) 08/04/2019  . Gouty arthritis of left great toe 08/02/2019  . Persistent atrial fibrillation ([place]), rapid ventricular response 12/03/2018  . Umbilical hernia without obstruction or gangrene 09/14/2018  . B12 deficiency 09/14/2018  . Erectile dysfunction 08/04/2016  . Elevated TSH 08/29/2015  . S/P coronary artery stent placement, 01/19/15 S/p DES of mid RCA 01/20/2015  . Unstable angina ([place])   . Prediabetes 10/03/2014  . TIA (transient ischemic attack) 07/31/2014  . Cerebral infarction ([place]) 06/05/2014  . NSTEMI (non-ST elevated myocardial infarction) ([place]) 06/04/2014  . Chronic kidney disease (CKD), stage III (moderate) 06/14/2010  . Coronary artery disease involving native  coronary artery of native heart without angina pectoris 03/01/2010  . TREMOR, ESSENTIAL, RIGHT HAND 04/24/2009  . PROSTATE CANCER, HX OF 04/24/2009  . Melanoma ([place]) 04/24/2009  . Hyperlipidemia LDL goal <70 10/28/2007  . Essential hypertension 10/13/2007    Current Outpatient Medications on File Prior to Visit  Medication Sig Dispense Refill  . allopurinol (ZYLOPRIM) 100 MG tablet TAKE 1 TABLET BY MOUTH EVERY DAY 90 tablet 1  . calcium carbonate (TUMS - DOSED IN MG ELEMENTAL CALCIUM) 500 MG chewable tablet Chew 2 tablets by mouth daily as needed for indigestion or heartburn.     . clopidogrel (PLAVIX) 75 MG tablet TAKE 1 TABLET (75 MG TOTAL) BY MOUTH DAILY WITH BREAKFAST. 90 tablet 2  . Cyanocobalamin (VITAMIN B12 PO) Take 1 tablet by mouth every evening.    . diltiazem (CARDIZEM CD) 240 MG 24 hr capsule Take 1 capsule (240 mg total) by mouth daily. 90 capsule 3  . ELIQUIS 5 MG TABS tablet TAKE 1 TABLET BY MOUTH TWICE A DAY 180 tablet 1  . ezetimibe (ZETIA) 10 MG tablet TAKE 1 TABLET BY MOUTH EVERY DAY 90 tablet 2  . fish oil-omega-3 fatty acids 1000 MG capsule Take 2 g by mouth 2 (two) times daily.      . metoprolol tartrate (LOPRESSOR) 50 MG tablet TAKE 1 TABLET BY MOUTH TWICE A DAY 180 tablet 2  .  Misc Natural Products (TART CHERRY ADVANCED PO) Take 1,000 mg by mouth daily.    . nitroGLYCERIN (NITROSTAT) 0.4 MG SL tablet Place 1 tablet (0.4 mg total) under the tongue every 5 (five) minutes x 3 doses as needed for chest pain. 25 tablet 4  . Potassium Gluconate 2 MEQ TABS Take 1 tablet by mouth daily.    . rosuvastatin (CRESTOR) 20 MG tablet Take 1 tablet (20 mg total) by mouth daily. 90 tablet 3   No current facility-administered medications on file prior to visit.    Past Medical History:  Diagnosis Date  . Anemia, mild   . CAD (coronary artery disease)    a. 02/2010 NSTEMI/PCI: PROMUS DES to mRCA. b. 06/05/14 Cath/PCI: pLAD 70%--> s/p PCI/DES (Promus DES);  c. 01/2015 Cath/PCI: LM  nl, LAD 55m, patent prox stent, LCX patent prox stent, RCA 30p, 47m (3.5x20 Synergy DES). // Myoview 07/2019:  EF 48, atten artifact, no ischemia, intermediate risk (low EF)   . CKD (chronic kidney disease), stage III   . Diastolic dysfunction    a. Echo 6/11: mild LVH, EF 55-60%, GR1DD, Trivial MR, mild LAE. b. echo 06/04/14: EF 55-60%, no WMA, GR1DD, Ao valve mildly calcifed, mild MR, mild LAE  . History of pulmonary embolism   . Hyperlipidemia   . Hypertension    did not tolerate Lisinopril event at low dose  . Melanoma ([place])    a. 1987. b. 2012  . Myocardial infarction ([place])   . Persistent atrial fibrillation ([place]) 12/03/2018   Dx 11/2018 >> Apixaban started // Echo 12/2018: EF 55-60, mod RVE, normal RVSF, mild LAE, trivial MR, mild TR, mod AoV sclerosis   . Prostate cancer ([place])    Dr Karsten Ro  . Stroke ([place])   . TIA (transient ischemic attack)    a. post cath and PCI on 06/05/2014 - no residual sequelae.  . Vitamin B12 deficiency     Past Surgical History:  Procedure Laterality Date  . CHOLECYSTECTOMY    . COLONOSCOPY  2006  . CORONARY STENT INTERVENTION N/A 08/05/2019   Procedure: CORONARY STENT INTERVENTION;  Surgeon: Jettie Booze, MD;  Location: [facility];  Service: Cardiovascular;  Laterality: N/A;  . KNEE ARTHROSCOPY     right  . LEFT HEART CATH AND CORONARY ANGIOGRAPHY N/A 08/05/2019   Procedure: LEFT HEART CATH AND CORONARY ANGIOGRAPHY;  Surgeon: Jettie Booze, MD;  Location: [facility];  Service: Cardiovascular;  Laterality: N/A;  . LEFT HEART CATHETERIZATION WITH CORONARY ANGIOGRAM N/A 06/05/2014   Procedure: LEFT HEART CATHETERIZATION WITH CORONARY ANGIOGRAM;  Surgeon: Blane Ohara, MD;  Location: [facility] CATH LAB;  Service: Cardiovascular;  Laterality: N/A;  . LEFT HEART CATHETERIZATION WITH CORONARY ANGIOGRAM N/A 01/19/2015   Procedure: LEFT HEART CATHETERIZATION WITH CORONARY ANGIOGRAM;  Surgeon: Peter M Martinique, MD;  Location: [facility] CATH LAB;   Service: Cardiovascular;  Laterality: N/A;  . MELANOMA EXCISION  2011    L chest  . MELANOMA EXCISION  1991   back  . mRCA stent  02/2010  . PROSTATECTOMY     Dr. Karsten Ro  . TONSILLECTOMY      Social History   Socioeconomic History  . Marital status: Married    Spouse name: Not on file  . Number of children: Not on file  . Years of education: Not on file  . Highest education level: Not on file  Occupational History  . Not on file  Tobacco Use  . Smoking status: Never  Smoker  . Smokeless tobacco: Never Used  Substance and Sexual Activity  . Alcohol use: No    Alcohol/week: 0.0 standard drinks  . Drug use: No  . Sexual activity: Not on file  Other Topics Concern  . Not on file  Social History Narrative  . Not on file   Social Determinants of Health   Financial Resource Strain:   . Difficulty of Paying Living Expenses:   Food Insecurity:   . Worried About Charity fundraiser in the Last Year:   . Arboriculturist in the Last Year:   Transportation Needs:   . Film/video editor (Medical):   Marland Kitchen Lack of Transportation (Non-Medical):   Physical Activity:   . Days of Exercise per Week:   . Minutes of Exercise per Session:   Stress:   . Feeling of Stress :   Social Connections:   . Frequency of Communication with Friends and Family:   . Frequency of Social Gatherings with Friends and Family:   . Attends Religious Services:   . Active Member of Clubs or Organizations:   . Attends Archivist Meetings:   Marland Kitchen Marital Status:     Family History  Problem Relation Age of Onset  . Coronary artery disease Mother        S/P CABG, in her 17s  . Transient ischemic attack Mother 59  . Heart disease Mother   . Prostate cancer Father   . Hypertension Brother   . Diabetes Sister     Review of Systems  Constitutional: Negative for chills and fever.  Gastrointestinal: Negative for abdominal pain and nausea.  Genitourinary: Positive for frequency (x 2-3 days).  Negative for difficulty urinating, dysuria and hematuria (orange in color).       Objective:   Vitals:   01/27/20 1438  BP: 138/82  Pulse: 94  Resp: 16  Temp: 98.8 F (37.1 C)  SpO2: 99%   BP Readings from Last 3 Encounters:  01/27/20 138/82  09/12/19 (!) 142/80  08/24/19 120/70   Wt Readings from Last 3 Encounters:  01/27/20 219 lb (99.3 kg)  09/12/19 218 lb (98.9 kg)  08/24/19 211 lb (95.7 kg)   Body mass index is 26.66 kg/m.   Physical Exam Constitutional:      General: He is not in acute distress.    Appearance: Normal appearance. He is not ill-appearing.  HENT:     Head: Normocephalic and atraumatic.  Abdominal:     General: There is no distension.     Palpations: Abdomen is soft. There is no mass.     Tenderness: There is no abdominal tenderness. There is no right CVA tenderness, left CVA tenderness, guarding or rebound.  Musculoskeletal:        General: Tenderness (Mild tenderness with palpation right flank just under lateral ribs-no rebound or guarding) present. No swelling or deformity.     Comments: No lower back pain  Skin:    General: Skin is warm and dry.     Findings: No erythema or rash.  Neurological:     General: No focal deficit present.     Mental Status: He is alert.            Assessment & Plan:    See Problem List for Assessment and Plan of chronic medical problems.    This visit occurred during the SARS-CoV-2 public health emergency.  Safety protocols were in place, including screening questions prior to the visit, additional usage  of staff PPE, and extensive cleaning of exam room while observing appropriate contact time as indicated for disinfecting solutions.

## 2020-01-27 ENCOUNTER — Other Ambulatory Visit: Payer: Self-pay

## 2020-01-27 ENCOUNTER — Ambulatory Visit (INDEPENDENT_AMBULATORY_CARE_PROVIDER_SITE_OTHER): Payer: Medicare HMO | Admitting: Internal Medicine

## 2020-01-27 ENCOUNTER — Encounter: Payer: Self-pay | Admitting: Internal Medicine

## 2020-01-27 VITALS — BP 138/82 | HR 94 | Temp 98.8°F | Resp 16 | Ht 76.0 in | Wt 219.0 lb

## 2020-01-27 DIAGNOSIS — R109 Unspecified abdominal pain: Secondary | ICD-10-CM | POA: Diagnosis not present

## 2020-01-27 DIAGNOSIS — R10A1 Flank pain, right side: Secondary | ICD-10-CM | POA: Insufficient documentation

## 2020-01-27 LAB — URINALYSIS, ROUTINE W REFLEX MICROSCOPIC
Bilirubin Urine: NEGATIVE
Hgb urine dipstick: NEGATIVE
Ketones, ur: NEGATIVE
Leukocytes,Ua: NEGATIVE
Nitrite: NEGATIVE
RBC / HPF: NONE SEEN (ref 0–?)
Specific Gravity, Urine: 1.02 (ref 1.000–1.030)
Total Protein, Urine: NEGATIVE
Urine Glucose: NEGATIVE
Urobilinogen, UA: 0.2 (ref 0.0–1.0)
pH: 6 (ref 5.0–8.0)

## 2020-01-27 LAB — CBC WITH DIFFERENTIAL/PLATELET
Basophils Absolute: 0 10*3/uL (ref 0.0–0.1)
Basophils Relative: 0.6 % (ref 0.0–3.0)
Eosinophils Absolute: 0.3 10*3/uL (ref 0.0–0.7)
Eosinophils Relative: 4.3 % (ref 0.0–5.0)
HCT: 43 % (ref 39.0–52.0)
Hemoglobin: 14.5 g/dL (ref 13.0–17.0)
Lymphocytes Relative: 31.9 % (ref 12.0–46.0)
Lymphs Abs: 1.9 10*3/uL (ref 0.7–4.0)
MCHC: 33.8 g/dL (ref 30.0–36.0)
MCV: 81.9 fl (ref 78.0–100.0)
Monocytes Absolute: 0.5 10*3/uL (ref 0.1–1.0)
Monocytes Relative: 8.1 % (ref 3.0–12.0)
Neutro Abs: 3.2 10*3/uL (ref 1.4–7.7)
Neutrophils Relative %: 55.1 % (ref 43.0–77.0)
Platelets: 139 10*3/uL — ABNORMAL LOW (ref 150.0–400.0)
RBC: 5.25 Mil/uL (ref 4.22–5.81)
RDW: 15.4 % (ref 11.5–15.5)
WBC: 5.9 10*3/uL (ref 4.0–10.5)

## 2020-01-27 LAB — COMPREHENSIVE METABOLIC PANEL
ALT: 27 U/L (ref 0–53)
AST: 24 U/L (ref 0–37)
Albumin: 4.4 g/dL (ref 3.5–5.2)
Alkaline Phosphatase: 55 U/L (ref 39–117)
BUN: 17 mg/dL (ref 6–23)
CO2: 32 mEq/L (ref 19–32)
Calcium: 9.7 mg/dL (ref 8.4–10.5)
Chloride: 103 mEq/L (ref 96–112)
Creatinine, Ser: 1.72 mg/dL — ABNORMAL HIGH (ref 0.40–1.50)
GFR: 38.88 mL/min — ABNORMAL LOW (ref >60.00)
Glucose, Bld: 78 mg/dL (ref 70–99)
Potassium: 5 mEq/L (ref 3.5–5.1)
Sodium: 139 mEq/L (ref 135–145)
Total Bilirubin: 0.5 mg/dL (ref 0.2–1.2)
Total Protein: 7 g/dL (ref 6.0–8.3)

## 2020-01-27 NOTE — Assessment & Plan Note (Signed)
Acute Concern for musculoskeletal cause, UTI, renal stone or abdominal pathology He does have increased pain with certain movements and most likely this is musculoskeletal in nature He does have some urinary symptoms-change in urine color, but that seems to be intermittent Check urinalysis, urine culture to rule out UTI, blood in urine that may indicate possible stone Check CBC, CMP given decreased kidney function and the risk of possible infection Advised him to revise his activities and take it easy this weekend If pain increases, persists may need to do imaging

## 2020-01-27 NOTE — Patient Instructions (Signed)
  Blood work was ordered.     Medications reviewed and updated.  Changes include : none       Please call if there is no improvement in your symptoms.    

## 2020-01-28 LAB — URINE CULTURE: Result:: NO GROWTH

## 2020-02-29 ENCOUNTER — Other Ambulatory Visit: Payer: Self-pay

## 2020-02-29 ENCOUNTER — Ambulatory Visit: Payer: Medicare HMO | Admitting: Cardiovascular Disease

## 2020-02-29 ENCOUNTER — Encounter: Payer: Self-pay | Admitting: Cardiovascular Disease

## 2020-02-29 VITALS — BP 122/74 | HR 82 | Ht 76.0 in | Wt 216.8 lb

## 2020-02-29 DIAGNOSIS — I4819 Other persistent atrial fibrillation: Secondary | ICD-10-CM

## 2020-02-29 DIAGNOSIS — E785 Hyperlipidemia, unspecified: Secondary | ICD-10-CM | POA: Diagnosis not present

## 2020-02-29 DIAGNOSIS — I2510 Atherosclerotic heart disease of native coronary artery without angina pectoris: Secondary | ICD-10-CM | POA: Diagnosis not present

## 2020-02-29 DIAGNOSIS — I214 Non-ST elevation (NSTEMI) myocardial infarction: Secondary | ICD-10-CM | POA: Diagnosis not present

## 2020-02-29 NOTE — Progress Notes (Signed)
Cardiology Office Note:    Date:  02/29/2020   ID:  [name], DOB 25-Mar-1946, MRN [id number]  PCP:  Binnie Rail, MD  Cardiologist:  Mertie Moores, MD  Electrophysiologist:  None   Referring MD: Binnie Rail, MD   Chief Complaint  Patient presents with  . Coronary Artery Disease    History of Present Illness:    [name] is a 74 y.o. male with a hx of history of coronary artery disease.  He status post non-ST segment elevation myocardial infarction in 2011.  He status post DES to the RCA, status post DES to the LAD in 2015, status post DES to the RCA in 2016.  He has mildly reduced left ventricular systolic function by echo in October, 2020.  He also has a history of hypertension, hyperlipidemia, CKD, pulmonary embolus, persistent atrial fibrillation and is on Eliquis.  This is the first time meeting him.   No cp.  Is active , does lots of yard work .   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 Anemia, mild   . CAD (coronary artery disease)    a. 02/2010 NSTEMI/PCI: PROMUS DES to mRCA. b. 06/05/14 Cath/PCI: pLAD 70%--> s/p PCI/DES (Promus DES);  c. 01/2015 Cath/PCI: LM nl, LAD 79m, patent prox stent, LCX patent prox stent, RCA 30p, 22m (3.5x20 Synergy DES). // Myoview 07/2019:  EF 48, atten artifact, no ischemia, intermediate risk (low EF)   . CKD (chronic kidney disease), stage III   . Diastolic dysfunction    a. Echo 6/11: mild LVH, EF 55-60%, GR1DD, Trivial MR, mild LAE. b. echo 06/04/14: EF 55-60%, no WMA, GR1DD, Ao valve mildly calcifed, mild MR, mild LAE  . History of pulmonary embolism   . Hyperlipidemia   . Hypertension    did not tolerate Lisinopril event at low dose  . Melanoma ([place])    a. 1987. b. 2012  . Myocardial infarction ([place])   . Persistent atrial fibrillation ([place]) 12/03/2018   Dx 11/2018 >> Apixaban started // Echo 12/2018: EF 55-60, mod RVE, normal RVSF, mild LAE, trivial MR, mild TR,  mod AoV sclerosis   . Prostate cancer ([place])    Dr Karsten Ro  . Stroke ([place])   . TIA (transient ischemic attack)    a. post cath and PCI on 06/05/2014 - no residual sequelae.  . Vitamin B12 deficiency     Past Surgical History:  Procedure Laterality Date  . CHOLECYSTECTOMY    . COLONOSCOPY  2006  . CORONARY STENT INTERVENTION N/A 08/05/2019   Procedure: CORONARY STENT INTERVENTION;  Surgeon: Jettie Booze, MD;  Location: [place] [facility];  Service: Cardiovascular;  Laterality: N/A;  . KNEE ARTHROSCOPY     right  . LEFT HEART CATH AND CORONARY ANGIOGRAPHY N/A 08/05/2019   Procedure: LEFT HEART CATH AND CORONARY ANGIOGRAPHY;  Surgeon: Jettie Booze, MD;  Location: [facility];  Service: Cardiovascular;  Laterality: N/A;  . LEFT HEART CATHETERIZATION WITH CORONARY ANGIOGRAM N/A 06/05/2014   Procedure: LEFT HEART CATHETERIZATION WITH CORONARY ANGIOGRAM;  Surgeon: Blane Ohara, MD;  Location: [facility] CATH LAB;  Service: Cardiovascular;  Laterality: N/A;  . LEFT HEART CATHETERIZATION WITH CORONARY ANGIOGRAM N/A 01/19/2015   Procedure: LEFT HEART CATHETERIZATION WITH CORONARY ANGIOGRAM;  Surgeon: Peter M Martinique, MD;  Location[facility] C[facility] LAB;  Service: Cardiovascular;  Laterality: N/A;  . MELANOMA EXCISION  2011  L chest  . MELANOMA EXCISION  1991   back  . mRCA stent  02/2010  . PROSTATECTOMY     Dr. Karsten Ro  . TONSILLECTOMY      Current Medications: Current Meds  Medication Sig  . allopurinol (ZYLOPRIM) 100 MG tablet TAKE 1 TABLET BY MOUTH EVERY DAY  . calcium carbonate (TUMS - DOSED IN MG ELEMENTAL CALCIUM) 500 MG chewable tablet Chew 2 tablets by mouth daily as needed for indigestion or heartburn.   . clopidogrel (PLAVIX) 75 MG tablet TAKE 1 TABLET (75 MG TOTAL) BY MOUTH DAILY WITH BREAKFAST.  Marland Kitchen Cyanocobalamin (VITAMIN B12 PO) Take 1 tablet by mouth every evening.  . diltiazem (CARDIZEM CD) 240 MG 24 hr capsule Take 1 capsule (240 mg total) by mouth daily.  Marland Kitchen  ELIQUIS 5 MG TABS tablet TAKE 1 TABLET BY MOUTH TWICE A DAY  . ezetimibe (ZETIA) 10 MG tablet TAKE 1 TABLET BY MOUTH EVERY DAY  . fish oil-omega-3 fatty acids 1000 MG capsule Take 2 g by mouth 2 (two) times daily.    . metoprolol tartrate (LOPRESSOR) 50 MG tablet TAKE 1 TABLET BY MOUTH TWICE A DAY  . Misc Natural Products (TART CHERRY ADVANCED PO) Take 1,000 mg by mouth daily.  . nitroGLYCERIN (NITROSTAT) 0.4 MG SL tablet Place 1 tablet (0.4 mg total) under the tongue every 5 (five) minutes x 3 doses as needed for chest pain.  Marland Kitchen Potassium Gluconate 2 MEQ TABS Take 1 tablet by mouth daily.  . rosuvastatin (CRESTOR) 20 MG tablet Take 1 tablet (20 mg total) by mouth daily.     Allergies:   Nitroglycerin and Lidocaine   Social History   Socioeconomic History  . Marital status: Married    Spouse name: Not on file  . Number of children: Not on file  . Years of education: Not on file  . Highest education level: Not on file  Occupational History  . Not on file  Tobacco Use  . Smoking status: Never Smoker  . Smokeless tobacco: Never Used  Substance and Sexual Activity  . Alcohol use: No    Alcohol/week: 0.0 standard drinks  . Drug use: No  . Sexual activity: Not on file  Other Topics Concern  . Not on file  Social History Narrative  . Not on file   Social Determinants of Health   Financial Resource Strain:   . Difficulty of Paying Living Expenses:   Food Insecurity:   . Worried About Charity fundraiser in the Last Year:   . Arboriculturist in the Last Year:   Transportation Needs:   . Film/video editor (Medical):   Marland Kitchen Lack of Transportation (Non-Medical):   Physical Activity:   . Days of Exercise per Week:   . Minutes of Exercise per Session:   Stress:   . Feeling of Stress :   Social Connections:   . Frequency of Communication with Friends and Family:   . Frequency of Social Gatherings with Friends and Family:   . Attends Religious Services:   . Active Member of  Clubs or Organizations:   . Attends Archivist Meetings:   Marland Kitchen Marital Status:      Family History: The patient's family history includes Coronary artery disease in his mother; Diabetes in his sister; Heart disease in his mother; Hypertension in his brother; Prostate cancer in his father; Transient ischemic attack (age of onset: 27) in his mother.  ROS:   Please see the history  of present illness.     All other systems reviewed and are negative.  EKGs/Labs/Other Studies Reviewed:    The following studies were reviewed today:    EKG:     Recent Labs: 01/27/2020: ALT 27; BUN 17; Creatinine, Ser 1.72; Hemoglobin 14.5; Platelets 139.0; Potassium 5.0; Sodium 139  Recent Lipid Panel    Component Value Date/Time   CHOL 99 (L) 10/19/2019 0809   CHOL 122 (L) 02/07/2016 0745   TRIG 120 10/19/2019 0809   TRIG 113 02/07/2016 0745   TRIG 220 (HH) 09/07/2006 1105   HDL 41 10/19/2019 0809   HDL 38 (L) 02/07/2016 0745   CHOLHDL 2.4 10/19/2019 0809   CHOLHDL 3 09/14/2018 1130   VLDL 24.4 09/14/2018 1130   LDLCALC 36 10/19/2019 0809   LDLCALC 61 02/07/2016 0745   LDLDIRECT 68.8 07/20/2012 0837    Physical Exam:    VS:  BP 122/74   Pulse 82   Ht 6\' 4"  (1.93 m)   Wt 216 lb 12.8 oz (98.3 kg)   SpO2 97%   BMI 26.39 kg/m     Wt Readings from Last 3 Encounters:  02/29/20 216 lb 12.8 oz (98.3 kg)  01/27/20 219 lb (99.3 kg)  09/12/19 218 lb (98.9 kg)     GEN:  Well nourished, well developed in no acute distress. Elderly male,  HEENT: Normal NECK: No JVD; No carotid bruits LYMPHATICS: No lymphadenopathy CARDIAC: Irreg. Irreg RESPIRATORY:  Clear to auscultation without rales, wheezing or rhonchi  ABDOMEN: Soft, non-tender, non-distended MUSCULOSKELETAL:  No edema; No deformity  SKIN: Warm and dry NEUROLOGIC:  Alert and oriented x 3 PSYCHIATRIC:  Normal affect   ASSESSMENT:    1. Coronary artery disease involving native coronary artery of native heart without angina  pectoris   2. Hyperlipidemia, unspecified hyperlipidemia type    PLAN:    In order of problems listed above:  1.  Coronary artery disease: He is status post stenting in October.  He seems to be doing well.  Is not had any episodes of angina.  Currently on Plavix and Eliquis.  2.  Atrial fibrillation: He has persistent atrial fibrillation.  He is on Eliquis.  HR is well controlled.    3.  Hyperlipidemia His last lipids were checked in January.  His levels were reviewed and are very low.  We will check them again when he sees my APP in 6 months.  4.  Chronic kidney disease: His creatinine is 1.7.  He will follow up with his primary medical doctor.  Medication Adjustments/Labs and Tests Ordered: Current medicines are reviewed at length with the patient today.  Concerns regarding medicines are outlined above.    Orders Placed This Encounter  Procedures  . Hepatic function panel  . Lipid panel  . Basic metabolic panel   No orders of the defined types were placed in this encounter.   Patient Instructions  Medication Instructions:  *If you need a refill on your cardiac medications before your next appointment, please call your pharmacy*  Lab Work: Your physician recommends that you return for lab work in: 6 months prior to appointment for fasting lipid and liver panel, BMET  If you have labs (blood work) drawn today and your tests are completely normal, you will receive your results only by: Marland Kitchen MyChart Message (if you have MyChart) OR . A paper copy in the mail If you have any lab test that is abnormal or we need to change your treatment, we will call  you to review the results.  Follow-Up: At [facility], you and your health needs are our priority.  As part of our continuing mission to provide you with exceptional heart care, we have created designated Provider Care Teams.  These Care Teams include your primary Cardiologist (physician) and Advanced Practice Providers (APPs -   Physician Assistants and Nurse Practitioners) who all work together to provide you with the care you need, when you need it.  We recommend signing up for the patient portal called "MyChart".  Sign up information is provided on this After Visit Summary.  MyChart is used to connect with patients for Virtual Visits (Telemedicine).  Patients are able to view lab/test results, encounter notes, upcoming appointments, etc.  Non-urgent messages can be sent to your provider as well.   To learn more about what you can do with MyChart, go to [email or web address].    Your next appointment:   6 month(s)  The format for your next appointment:   In Person  Provider:   You may see one of the following Advanced Practice Providers on your designated Care Team:    [name], PA-C  Robbie Lis, [place]       Signed, Mertie Moores, MD  02/29/2020 9:02 AM    [place]

## 2020-02-29 NOTE — Patient Instructions (Signed)
Medication Instructions:  *If you need a refill on your cardiac medications before your next appointment, please call your pharmacy*  Lab Work: Your physician recommends that you return for lab work in: 6 months prior to appointment for fasting lipid and liver panel, BMET  If you have labs (blood work) drawn today and your tests are completely normal, you will receive your results only by: Marland Kitchen MyChart Message (if you have MyChart) OR . A paper copy in the mail If you have any lab test that is abnormal or we need to change your treatment, we will call you to review the results.  Follow-Up: At [facility], you and your health needs are our priority.  As part of our continuing mission to provide you with exceptional heart care, we have created designated Provider Care Teams.  These Care Teams include your primary Cardiologist (physician) and Advanced Practice Providers (APPs -  Physician Assistants and Nurse Practitioners) who all work together to provide you with the care you need, when you need it.  We recommend signing up for the patient portal called "MyChart".  Sign up information is provided on this After Visit Summary.  MyChart is used to connect with patients for Virtual Visits (Telemedicine).  Patients are able to view lab/test results, encounter notes, upcoming appointments, etc.  Non-urgent messages can be sent to your provider as well.   To learn more about what you can do with MyChart, go to [email or web address].    Your next appointment:   6 month(s)  The format for your next appointment:   In Person  Provider:   You may see one of the following Advanced Practice Providers on your designated Care Team:    [name], PA-C  [place], [place]

## 2020-03-13 DIAGNOSIS — H40013 Open angle with borderline findings, low risk, bilateral: Secondary | ICD-10-CM | POA: Diagnosis not present

## 2020-03-13 DIAGNOSIS — Z0100 Encounter for examination of eyes and vision without abnormal findings: Secondary | ICD-10-CM | POA: Diagnosis not present

## 2020-03-13 DIAGNOSIS — H2513 Age-related nuclear cataract, bilateral: Secondary | ICD-10-CM | POA: Diagnosis not present

## 2020-03-22 ENCOUNTER — Telehealth: Payer: Self-pay

## 2020-03-22 ENCOUNTER — Other Ambulatory Visit: Payer: Self-pay | Admitting: Internal Medicine

## 2020-03-22 DIAGNOSIS — M109 Gout, unspecified: Secondary | ICD-10-CM

## 2020-03-22 MED ORDER — PREDNISONE 10 MG PO TABS: ORAL_TABLET | ORAL | 0 refills | Status: DC

## 2020-03-22 NOTE — Telephone Encounter (Signed)
New message    The patient is asking can Dr. Quay Burow prescribe his prednisone due to gout, the patient voiced the MD has called this medication in before.    [id number] [place], [place] - [phone]

## 2020-04-17 ENCOUNTER — Other Ambulatory Visit: Payer: Self-pay | Admitting: Cardiovascular Disease

## 2020-04-17 NOTE — Telephone Encounter (Signed)
Pt last saw Dr Acie Fredrickson 02/29/20, last labs 01/27/20 Creat 1.72, age 74, weight 98.3kg, based on specified criteria pt is on appropriate dosage of Eliquis 5mg  BID.  Will refill rx.

## 2020-04-25 ENCOUNTER — Other Ambulatory Visit: Payer: Self-pay | Admitting: Cardiovascular Disease

## 2020-05-11 ENCOUNTER — Other Ambulatory Visit: Payer: Self-pay

## 2020-05-11 ENCOUNTER — Encounter: Payer: Self-pay | Admitting: Physician Assistant

## 2020-05-11 ENCOUNTER — Telehealth: Payer: Self-pay | Admitting: Cardiovascular Disease

## 2020-05-11 ENCOUNTER — Ambulatory Visit: Payer: Medicare HMO | Admitting: Physician Assistant

## 2020-05-11 VITALS — BP 108/70 | HR 82 | Ht 76.0 in | Wt 216.0 lb

## 2020-05-11 DIAGNOSIS — G459 Transient cerebral ischemic attack, unspecified: Secondary | ICD-10-CM

## 2020-05-11 DIAGNOSIS — I10 Essential (primary) hypertension: Secondary | ICD-10-CM | POA: Diagnosis not present

## 2020-05-11 DIAGNOSIS — E785 Hyperlipidemia, unspecified: Secondary | ICD-10-CM | POA: Diagnosis not present

## 2020-05-11 DIAGNOSIS — I4821 Permanent atrial fibrillation: Secondary | ICD-10-CM | POA: Diagnosis not present

## 2020-05-11 DIAGNOSIS — I2510 Atherosclerotic heart disease of native coronary artery without angina pectoris: Secondary | ICD-10-CM

## 2020-05-11 NOTE — Telephone Encounter (Signed)
Spoke with pt's wife and could hear pt in background last Friday while at [employer] had trouble ordering food and did when he did  get food was not what pt thought he had ordered Per wife on ride home seemed pt was not able to comprehend this episode lasted about 45 min and since this episode pt has noted SOB Pt requesting an appt Appt made with [name] PA today at 11:15 am .Adonis Housekeeper

## 2020-05-11 NOTE — Patient Instructions (Signed)
Medication Instructions:  Your physician recommends that you continue on your current medications as directed. Please refer to the Current Medication list given to you today.  Lab Work: You will have labs drawn today: CMET/CBC/TSH/Lipids  Testing/Procedures: Your physician has requested that you have an echocardiogram. Echocardiography is a painless test that uses sound waves to create images of your heart. It provides your doctor with information about the size and shape of your heart and how well your heart's chambers and valves are working. This procedure takes approximately one hour. There are no restrictions for this procedure.  Your physician has requested that you have a carotid duplex. This test is an ultrasound of the carotid arteries in your neck. It looks at blood flow through these arteries that supply the brain with blood. Allow one hour for this exam. There are no restrictions or special instructions.  Non-Cardiac CT scanning, (CAT scanning), is a noninvasive, special x-ray that produces cross-sectional images of the body using x-rays and a computer. CT scans help physicians diagnose and treat medical conditions. For some CT exams, a contrast material is used to enhance visibility in the area of the body being studied. CT scans provide greater clarity and reveal more details than regular x-ray exams.  Follow-Up: On 06/12/20 at 10:40AM with Mertie Moores, MD

## 2020-05-11 NOTE — Telephone Encounter (Signed)
    Pt's wife calling, she is concern about pt, she notice last Friday the way he look and the they way he is acting, he looks like he has mini stroke. She is concern and would like to speak with a nurse

## 2020-05-11 NOTE — Progress Notes (Signed)
Cardiology Office Note:    Date:  05/11/2020   ID:  [name], DOB [birth year], MRN [id number]  PCP:  Binnie Rail, MD  Cardiologist:  Mertie Moores, MD   Electrophysiologist:  None   Referring MD: Binnie Rail, MD   Chief Complaint:  Altered Mental Status    Patient Profile:    [name] is a 74 y.o. male with:   Coronary artery disease ? S/p NSTEMI 02/2010 >> PCI: DES to the mid RCA ? S/p NSTEMI in 2015 >> PCI: DES to the proximal LAD  C/b post cath TIA ? Cath/PCI 01/2015:  DES to mid RCA ? S/p DES to LAD in 07/2019  Persistent atrial fibrillation ? CHADS2-VASc=6 (CVA, CAD, HTN, age x2) >> Apixaban   Hypertension  Hyperlipidemia  Chronic kidney disease  History of pulmonary embolism  Sleep apnea  Prostate CA  Prior CV studies: Cardiac catheterization 08-23-2019 RCA mid 25, prox stent patent LAD prox stent patent, mid 90 PCI:  2.5 x 24 mm Synergy DES to mLAD   Echocardiogram August 23, 2019 EF 55-60, mild LAE, mild MR, mild TR, RVSP 39.1  Myoview 07/29/2019 Small size, moderate intensity basal to mid inferior perfusion defect that is seen in the rest and stress supine images, but resolves in the stress upright images, this suggests bowel attenuation artifact. LVEF 48% with mild inferior hypokinesis - however, visually, the LVEF appears higher. Overall this is an intermediate risk study, but no significant reversible ischemia is suspected.  LT Cardiac Monitor ([employer]) 01/20/2019  Atrial fibrillation, Avg HR 75  Rare PVCs or aberrantly conducted beats  No significant pauses or episodes of tachycardia  Echo 12/08/2018 EF 55-60, mild LAE, trivial MR, mild TR  Cardiac Catheterization4/15/16 Left mainstem:Normal Left anterior descending (LAD):The stent in the proximal LAD is widely patent. There is a 50% stenosis in the mid vessel.  Left circumflex (LCx):There is a stent in the proximal LCx and it is widely patent. No obstructive disease. Right coronary  artery (RCA):The RCA is a large dominant vessel. It is tortuous. There is diffuse 30% disease in the proximal vessel. There is a focal 90% stenosis in the mid vessel.  Left ventriculography: Not done due to CKD. PCI: 3.5 x 20 mm Synergy DES to mid RCA  Echo 06/04/14 EF 55-60, no RWMA, Gr 1 DD, mild AoV calcification, mild MR, mild LAE  History of Present Illness:    [name] was last seen in 02/2020 by Dr. Acie Fredrickson.  He called in today with concerns of having a recent stroke.  He went out to eat 1 week ago and became confused.  His wife could not get him to respond for quite some time.  He was reaching for something in the car on the way home.  She notes that he stated he was trying to "change the hitch".  His symptoms went on for about 45 to 60 minutes.  She wanted to take him to the emergency room but he declined.  He had been in his usual state of health until this happened.  He has been better since this.  However, he does have some mild confusion at times.  He also notes some difficulty with his balance at times when he first stands up.  He has not had any facial droop or unilateral weakness.  He has not had a visual field deficits.  He does note that, during the episode, he was not able to read the menu at the restaurant.  He  has not missed any doses of Apixaban.  He has not had any headaches.  However, he does note a history of migraines.  He has chronic shortness of breath with exertion.  This is unchanged.  He has not had chest discomfort, syncope, leg swelling  Past Medical History:  Diagnosis Date  . Anemia, mild   . CAD (coronary artery disease)    a. 02/2010 NSTEMI/PCI: PROMUS DES to mRCA. b. 06/05/14 Cath/PCI: pLAD 70%--> s/p PCI/DES (Promus DES);  c. 01/2015 Cath/PCI: LM nl, LAD 4m, patent prox stent, LCX patent prox stent, RCA 30p, 38m (3.5x20 Synergy DES). // Myoview 07/2019:  EF 48, atten artifact, no ischemia, intermediate risk (low EF)   . CKD (chronic kidney disease), stage III     . Diastolic dysfunction    a. Echo 6/11: mild LVH, EF 55-60%, GR1DD, Trivial MR, mild LAE. b. echo 06/04/14: EF 55-60%, no WMA, GR1DD, Ao valve mildly calcifed, mild MR, mild LAE  . History of pulmonary embolism   . Hyperlipidemia   . Hypertension    did not tolerate Lisinopril event at low dose  . Melanoma ([place])    a. 1987. b. 2012  . Myocardial infarction ([place])   . Persistent atrial fibrillation ([place]) 12/03/2018   Dx 11/2018 >> Apixaban started // Echo 12/2018: EF 55-60, mod RVE, normal RVSF, mild LAE, trivial MR, mild TR, mod AoV sclerosis   . Prostate cancer ([place])    Dr Karsten Ro  . Stroke ([place])   . TIA (transient ischemic attack)    a. post cath and PCI on 06/05/2014 - no residual sequelae.  . Vitamin B12 deficiency     Current Medications: Current Meds  Medication Sig  . allopurinol (ZYLOPRIM) 100 MG tablet TAKE 1 TABLET BY MOUTH EVERY DAY  . calcium carbonate (TUMS - DOSED IN MG ELEMENTAL CALCIUM) 500 MG chewable tablet Chew 2 tablets by mouth daily as needed for indigestion or heartburn.   . clopidogrel (PLAVIX) 75 MG tablet TAKE 1 TABLET (75 MG TOTAL) BY MOUTH DAILY WITH BREAKFAST.  Marland Kitchen Cyanocobalamin (VITAMIN B12 PO) Take 1 tablet by mouth every evening.  . diltiazem (CARDIZEM CD) 240 MG 24 hr capsule Take 1 capsule (240 mg total) by mouth daily.  Marland Kitchen ELIQUIS 5 MG TABS tablet TAKE 1 TABLET BY MOUTH TWICE A DAY  . ezetimibe (ZETIA) 10 MG tablet TAKE 1 TABLET BY MOUTH EVERY DAY  . fish oil-omega-3 fatty acids 1000 MG capsule Take 2 g by mouth 2 (two) times daily.    . metoprolol tartrate (LOPRESSOR) 50 MG tablet TAKE 1 TABLET BY MOUTH TWICE A DAY  . Misc Natural Products (TART CHERRY ADVANCED PO) Take 1,000 mg by mouth daily.  . nitroGLYCERIN (NITROSTAT) 0.4 MG SL tablet Place 1 tablet (0.4 mg total) under the tongue every 5 (five) minutes x 3 doses as needed for chest pain.  Marland Kitchen Potassium Gluconate 2 MEQ TABS Take 1 tablet by mouth daily.  . rosuvastatin (CRESTOR) 20 MG tablet Take 1  tablet (20 mg total) by mouth daily.     Allergies:   Nitroglycerin and Lidocaine   Social History   Tobacco Use  . Smoking status: Never Smoker  . Smokeless tobacco: Never Used  Vaping Use  . Vaping Use: Never used  Substance Use Topics  . Alcohol use: No    Alcohol/week: 0.0 standard drinks  . Drug use: No     Family Hx: The patient's family history includes Coronary artery disease in his mother; Diabetes  in his sister; Heart disease in his mother; Hypertension in his brother; Prostate cancer in his father; Transient ischemic attack (age of onset: 35) in his mother.  ROS   EKGs/Labs/Other Test Reviewed:    EKG:  EKG is   ordered today.  The ekg ordered today demonstrates atrial fibrillation, HR 82, normal axis, QTC 415, no change from prior tracing  Recent Labs: 01/27/2020: ALT 27; BUN 17; Creatinine, Ser 1.72; Hemoglobin 14.5; Platelets 139.0; Potassium 5.0; Sodium 139   Recent Lipid Panel Lab Results  Component Value Date/Time   CHOL 99 (L) 10/19/2019 08:09 AM   CHOL 122 (L) 02/07/2016 07:45 AM   TRIG 120 10/19/2019 08:09 AM   TRIG 113 02/07/2016 07:45 AM   TRIG 220 (HH) 09/07/2006 11:05 AM   HDL 41 10/19/2019 08:09 AM   HDL 38 (L) 02/07/2016 07:45 AM   CHOLHDL 2.4 10/19/2019 08:09 AM   CHOLHDL 3 09/14/2018 11:30 AM   LDLCALC 36 10/19/2019 08:09 AM   LDLCALC 61 02/07/2016 07:45 AM   LDLDIRECT 68.8 07/20/2012 08:37 AM    Physical Exam:    VS:  BP 108/70   Pulse 82   Ht 6\' 4"  (1.93 m)   Wt 216 lb (98 kg)   SpO2 98%   BMI 26.29 kg/m     Wt Readings from Last 3 Encounters:  05/11/20 216 lb (98 kg)  02/29/20 216 lb 12.8 oz (98.3 kg)  01/27/20 219 lb (99.3 kg)     Constitutional:      Appearance: Healthy appearance. Not in distress.  Neck:     Thyroid: No thyromegaly.     Vascular: No carotid bruit. JVD normal.  Pulmonary:     Effort: Pulmonary effort is normal.     Breath sounds: No wheezing. No rales.  Cardiovascular:     Normal rate. Irregularly  irregular rhythm. Normal S1. Normal S2.     Murmurs: There is no murmur.  Edema:    Peripheral edema absent.  Abdominal:     Palpations: Abdomen is soft.  Skin:    General: Skin is warm and dry.  Neurological:     General: No focal deficit present.     Mental Status: Alert, oriented to person, place, and time and oriented to person, place and time.     Cranial Nerves: Cranial nerves are intact. No dysarthria or facial asymmetry.     Motor: No weakness.       ASSESSMENT & PLAN:    1. TIA (transient ischemic attack) He describes symptoms last week that sound very consistent with a TIA or possible stroke.  He does not seem to have any deficits on exam today.  His wife notes that he has some episodes of confusion still.  He also notes some difficulty with balance.  He has not missed any doses of Apixaban.  I suspect he may have had a lacunar infarct.    -I will arrange carotid Dopplers  -Arrange Echocardiogram.    -Arrange head CT without contrast to rule out bleed as he is on chronic anticoagulation.   -Refer to neurology.  Further evaluation such as MRI/MRA will be up to neurology.    -Continue clopidogrel, apixaban, rosuvastatin.    -Obtain CMET, lipids, CBC, TSH today.    He knows to go the emergency room if he has recurrent or worsening symptoms.  2. Coronary artery disease involving native coronary artery of native heart without angina pectoris History of prior myocardial infarction in 2011 treated with drug-eluting  stent to the mid RCA and non-STEMI in 2015 treated with a DES to the proximal LAD and DES to the RCA in 2016.   He is status post DES to the LAD in October 2020.  He has chronic shortness of breath with exertion without significant change.  He has not had anginal symptoms.  Continue clopidogrel, rosuvastatin, metoprolol tartrate.  3. Permanent atrial fibrillation (HCC) Rate is well controlled.  He has been adherent to anticoagulation with Apixaban.  Continue current  management.  4. Essential hypertension The patient's blood pressure is controlled on his current regimen.  Continue current therapy.   5. Hyperlipidemia LDL goal <70 Continue high intensity statin therapy.  Obtain fasting CMET, lipids today.   Dispo:  Return in about 4 weeks (around 06/08/2020) for Routine Follow Up, w/ Dr. Acie Fredrickson, or [name], PA-C, in person.   Medication Adjustments/Labs and Tests Ordered: Current medicines are reviewed at length with the patient today.  Concerns regarding medicines are outlined above.  Tests Ordered: Orders Placed This Encounter  Procedures  . CT Head Wo Contrast  . Comprehensive metabolic panel  . Lipid panel  . CBC  . TSH  . Ambulatory referral to Neurology  . EKG 12-Lead  . ECHOCARDIOGRAM COMPLETE  . VAS US CAROTID   Medication Changes: No orders of the defined types were placed in this encounter.   Signed, [name], PA-C  05/11/2020 1:31 PM    [facility] [address], [place], [place]  [address] Phone: [phone]; Fax: [phone]

## 2020-05-11 NOTE — Telephone Encounter (Signed)
See my OV note from today. [name], PA-C    05/11/2020 1:39 PM

## 2020-05-12 LAB — CBC
Hematocrit: 44.4 % (ref 37.5–51.0)
Hemoglobin: 14.5 g/dL (ref 13.0–17.7)
MCH: 27.8 pg (ref 26.6–33.0)
MCHC: 32.7 g/dL (ref 31.5–35.7)
MCV: 85 fL (ref 79–97)
Platelets: 146 10*3/uL — ABNORMAL LOW (ref 150–450)
RBC: 5.21 x10E6/uL (ref 4.14–5.80)
RDW: 14.1 % (ref 11.6–15.4)
WBC: 5.7 10*3/uL (ref 3.4–10.8)

## 2020-05-12 LAB — LIPID PANEL
Chol/HDL Ratio: 2.5 ratio (ref 0.0–5.0)
Cholesterol, Total: 106 mg/dL (ref 100–199)
HDL: 42 mg/dL (ref >39)
LDL Chol Calc (NIH): 46 mg/dL (ref 0–99)
Triglycerides: 96 mg/dL (ref 0–149)
VLDL Cholesterol Cal: 18 mg/dL (ref 5–40)

## 2020-05-12 LAB — COMPREHENSIVE METABOLIC PANEL
ALT: 30 IU/L (ref 0–44)
AST: 30 IU/L (ref 0–40)
Albumin/Globulin Ratio: 2.5 — ABNORMAL HIGH (ref 1.2–2.2)
Albumin: 4.8 g/dL — ABNORMAL HIGH (ref 3.7–4.7)
Alkaline Phosphatase: 60 IU/L (ref 48–121)
BUN/Creatinine Ratio: 10 (ref 10–24)
BUN: 18 mg/dL (ref 8–27)
Bilirubin Total: 0.7 mg/dL (ref 0.0–1.2)
CO2: 25 mmol/L (ref 20–29)
Calcium: 9.6 mg/dL (ref 8.6–10.2)
Chloride: 104 mmol/L (ref 96–106)
Creatinine, Ser: 1.86 mg/dL — ABNORMAL HIGH (ref 0.76–1.27)
GFR calc Af Amer: 40 mL/min/{1.73_m2} — ABNORMAL LOW (ref >59)
GFR calc non Af Amer: 34 mL/min/{1.73_m2} — ABNORMAL LOW (ref >59)
Globulin, Total: 1.9 g/dL (ref 1.5–4.5)
Glucose: 97 mg/dL (ref 65–99)
Potassium: 4.9 mmol/L (ref 3.5–5.2)
Sodium: 142 mmol/L (ref 134–144)
Total Protein: 6.7 g/dL (ref 6.0–8.5)

## 2020-05-12 LAB — TSH: TSH: 5.42 u[IU]/mL — ABNORMAL HIGH (ref 0.450–4.500)

## 2020-05-15 ENCOUNTER — Ambulatory Visit (HOSPITAL_COMMUNITY)
Admission: RE | Admit: 2020-05-15 | Discharge: 2020-05-15 | Disposition: A | Discharge status: Home / Self Care | Payer: Medicare HMO | Source: Ambulatory Visit | Attending: Cardiovascular Disease | Admitting: Cardiovascular Disease

## 2020-05-15 ENCOUNTER — Ambulatory Visit (INDEPENDENT_AMBULATORY_CARE_PROVIDER_SITE_OTHER)
Admission: RE | Admit: 2020-05-15 | Discharge: 2020-05-15 | Disposition: A | Discharge status: Home / Self Care | Payer: Medicare HMO | Source: Ambulatory Visit | Attending: Physician Assistant | Admitting: Physician Assistant

## 2020-05-15 ENCOUNTER — Other Ambulatory Visit: Payer: Self-pay

## 2020-05-15 DIAGNOSIS — R9082 White matter disease, unspecified: Secondary | ICD-10-CM | POA: Diagnosis not present

## 2020-05-15 DIAGNOSIS — G459 Transient cerebral ischemic attack, unspecified: Secondary | ICD-10-CM | POA: Diagnosis not present

## 2020-05-16 ENCOUNTER — Encounter: Payer: Self-pay | Admitting: Physician Assistant

## 2020-05-17 ENCOUNTER — Telehealth: Payer: Self-pay | Admitting: Neurology

## 2020-05-17 ENCOUNTER — Encounter: Payer: Self-pay | Admitting: Neurology

## 2020-05-17 ENCOUNTER — Ambulatory Visit: Payer: Medicare HMO | Admitting: Neurology

## 2020-05-17 VITALS — BP 112/84 | HR 69 | Ht 76.0 in | Wt 215.0 lb

## 2020-05-17 DIAGNOSIS — R410 Disorientation, unspecified: Secondary | ICD-10-CM

## 2020-05-17 DIAGNOSIS — H539 Unspecified visual disturbance: Secondary | ICD-10-CM

## 2020-05-17 NOTE — Progress Notes (Signed)
HISTORICAL  [name] is a 74 year old male, seen in request by his cardiology PA [name] for evaluation of possible stroke, his primary care physician is Dr. Quay Burow, [place] Board, initial evaluation was on May 17, 2020  I reviewed and summarized the referring note.  Past medical history Coronary artery disease, Plavix Persistent atrial fibrillation, Apixaban Hypertension Hyperlipidemia Chronic kidney disease History of pulmonary emboli Sleep apnea Prostate cancer History of melanoma,   On May 04, 2020, he drove out with his wife about 30 minutes to have a dinner at a [employer], while ordering, he noticed blurry vision, difficulty focusing, when the dishes came, he was a bit surprised, it is not his usual dishes that he always had, he noticed increased difficulty with his blurry vision, mild confusion when he stepped out of the restaurant about 40 minutes later, but he was able to help his wife adjusting his truck seat, for her to drive back, once he reached home, he noticed mild bilateral pressure headaches, to cover Tylenol, went to sleep, felt better afterwards.    He had history of chronic migraine with aura, proceed by tunnel vision lasting 30-60 minutes, followed by holocranial headaches, lasting 2-3 hours, improved by sleep  I personally reviewed CT head without contrast on May 15, 2020, no acute abnormality, mild supratentorium small vessel disease, generalized atrophy  Laboratory evaluation 2021, TSH was mildly elevated 5.4, normal CBC, LDL 46, creatinine 1.86, GFR 34,  Ultrasound of carotid artery showed no significant bilateral carotid artery or vertebral system disease  Echocardiogram pending on May 25, 2020  REVIEW OF SYSTEMS: Full 14 system review of systems performed and notable only for as above All other review of systems were negative.  ALLERGIES: Allergies  Allergen Reactions  . Nitroglycerin Other (See Comments)    Pt will not take.  States  he was in hospital and after taking second NTG SL pt states "I had to be shocked, I am scared of that medicine and will not take again".   . Lidocaine Other (See Comments)    Syncope post palmer injection; probably vasovagal as no reaction to intraarticular Lidocaine into knee    HOME MEDICATIONS: Current Outpatient Medications  Medication Sig Dispense Refill  . allopurinol (ZYLOPRIM) 100 MG tablet TAKE 1 TABLET BY MOUTH EVERY DAY 90 tablet 1  . calcium carbonate (TUMS - DOSED IN MG ELEMENTAL CALCIUM) 500 MG chewable tablet Chew 2 tablets by mouth daily as needed for indigestion or heartburn.     . clopidogrel (PLAVIX) 75 MG tablet TAKE 1 TABLET (75 MG TOTAL) BY MOUTH DAILY WITH BREAKFAST. 90 tablet 2  . Cyanocobalamin (VITAMIN B12 PO) Take 1 tablet by mouth every evening.    . diltiazem (CARDIZEM CD) 240 MG 24 hr capsule Take 1 capsule (240 mg total) by mouth daily. 90 capsule 3  . ELIQUIS 5 MG TABS tablet TAKE 1 TABLET BY MOUTH TWICE A DAY 180 tablet 1  . ezetimibe (ZETIA) 10 MG tablet TAKE 1 TABLET BY MOUTH EVERY DAY 90 tablet 2  . fish oil-omega-3 fatty acids 1000 MG capsule Take 2 g by mouth 2 (two) times daily.      . metoprolol tartrate (LOPRESSOR) 50 MG tablet TAKE 1 TABLET BY MOUTH TWICE A DAY 180 tablet 2  . Misc Natural Products (TART CHERRY ADVANCED PO) Take 1,000 mg by mouth daily.    . nitroGLYCERIN (NITROSTAT) 0.4 MG SL tablet Place 1 tablet (0.4 mg total) under the tongue every 5 (five)  minutes x 3 doses as needed for chest pain. 25 tablet 4  . Potassium Gluconate 2 MEQ TABS Take 1 tablet by mouth daily.    . rosuvastatin (CRESTOR) 20 MG tablet Take 1 tablet (20 mg total) by mouth daily. 90 tablet 3   No current facility-administered medications for this visit.    PAST MEDICAL HISTORY: Past Medical History:  Diagnosis Date  . Anemia, mild   . CAD (coronary artery disease)    a. 02/2010 NSTEMI/PCI: PROMUS DES to mRCA. b. 06/05/14 Cath/PCI: pLAD 70%--> s/p PCI/DES (Promus  DES);  c. 01/2015 Cath/PCI: LM nl, LAD 95m, patent prox stent, LCX patent prox stent, RCA 30p, 11m (3.5x20 Synergy DES). // Myoview 07/2019:  EF 48, atten artifact, no ischemia, intermediate risk (low EF)   . CKD (chronic kidney disease), stage III   . Diastolic dysfunction    a. Echo 6/11: mild LVH, EF 55-60%, GR1DD, Trivial MR, mild LAE. b. echo 06/04/14: EF 55-60%, no WMA, GR1DD, Ao valve mildly calcifed, mild MR, mild LAE  . History of pulmonary embolism   . Hyperlipidemia   . Hypertension    did not tolerate Lisinopril event at low dose  . Melanoma ([place])    a. 1987. b. 2012  . Myocardial infarction ([place])   . Persistent atrial fibrillation ([place]) 12/03/2018   Dx 11/2018 >> Apixaban started // Echo 12/2018: EF 55-60, mod RVE, normal RVSF, mild LAE, trivial MR, mild TR, mod AoV sclerosis   . Prostate cancer ([place])    Dr Karsten Ro  . Stroke ([place])   . TIA (transient ischemic attack)    a. post cath and PCI on 06/05/2014 - no residual sequelae. // ?recurrent TIA 8/21 >> Carotid US 8/21: normal    . Vitamin B12 deficiency     PAST SURGICAL HISTORY: Past Surgical History:  Procedure Laterality Date  . CHOLECYSTECTOMY    . COLONOSCOPY  2006  . CORONARY STENT INTERVENTION N/A 08/05/2019   Procedure: CORONARY STENT INTERVENTION;  Surgeon: Jettie Booze, MD;  Location: [facility];  Service: Cardiovascular;  Laterality: N/A;  . KNEE ARTHROSCOPY     right  . LEFT HEART CATH AND CORONARY ANGIOGRAPHY N/A 08/05/2019   Procedure: LEFT HEART CATH AND CORONARY ANGIOGRAPHY;  Surgeon: Jettie Booze, MD;  Location: [facility];  Service: Cardiovascular;  Laterality: N/A;  . LEFT HEART CATHETERIZATION WITH CORONARY ANGIOGRAM N/A 06/05/2014   Procedure: LEFT HEART CATHETERIZATION WITH CORONARY ANGIOGRAM;  Surgeon: Blane Ohara, MD;  [facility];  Service: Cardiovascular;  Laterality: N/A;  . LEFT HEART CATHETERIZATION WITH CORONARY ANGIOGRAM N/A 01/19/2015   Procedure:  LEFT HEART CATHETERIZATION WITH CORONARY ANGIOGRAM;  Surgeon: Peter M Martinique, MD;  Location: [facility] CATH LAB;  Service: Cardiovascular;  Laterality: N/A;  . MELANOMA EXCISION  2011    L chest  . MELANOMA EXCISION  1991   back  . mRCA stent  02/2010  . PROSTATECTOMY     Dr. Karsten Ro  . TONSILLECTOMY      FAMILY HISTORY: Family History  Problem Relation Age of Onset  . Coronary artery disease Mother        S/P CABG, in her 59s  . Transient ischemic attack Mother 44  . Heart disease Mother   . Prostate cancer Father   . Hypertension Brother   . Diabetes Sister     SOCIAL HISTORY: Social History   Socioeconomic History  . Marital status: Married    Spouse name: Not  on file  . Number of children: Not on file  . Years of education: Not on file  . Highest education level: Not on file  Occupational History  . Not on file  Tobacco Use  . Smoking status: Never Smoker  . Smokeless tobacco: Never Used  Vaping Use  . Vaping Use: Never used  Substance and Sexual Activity  . Alcohol use: No    Alcohol/week: 0.0 standard drinks  . Drug use: No  . Sexual activity: Not on file  Other Topics Concern  . Not on file  Social History Narrative   Right handed    Caffeine- 2-3 cups per day    Social Determinants of Health   Financial Resource Strain:   . Difficulty of Paying Living Expenses:   Food Insecurity:   . Worried About Charity fundraiser in the Last Year:   . Arboriculturist in the Last Year:   Transportation Needs:   . Film/video editor (Medical):   Marland Kitchen Lack of Transportation (Non-Medical):   Physical Activity:   . Days of Exercise per Week:   . Minutes of Exercise per Session:   Stress:   . Feeling of Stress :   Social Connections:   . Frequency of Communication with Friends and Family:   . Frequency of Social Gatherings with Friends and Family:   . Attends Religious Services:   . Active Member of Clubs or Organizations:   . Attends Archivist Meetings:    Marland Kitchen Marital Status:   Intimate Partner Violence:   . Fear of Current or Ex-Partner:   . Emotionally Abused:   Marland Kitchen Physically Abused:   . Sexually Abused:      PHYSICAL EXAM   Vitals:   05/17/20 0812  BP: 112/84  Pulse: 69  SpO2: 97%  Weight: 215 lb (97.5 kg)  Height: 6\' 4"  (1.93 m)   Not recorded     Body mass index is 26.17 kg/m.  PHYSICAL EXAMNIATION:  Gen: NAD, conversant, well nourised, well groomed                     Cardiovascular: Regular rate rhythm, no peripheral edema, warm, nontender. Eyes: Conjunctivae clear without exudates or hemorrhage Neck: Supple, no carotid bruits. Pulmonary: Clear to auscultation bilaterally   NEUROLOGICAL EXAM:  MENTAL STATUS: Speech:    Speech is normal; fluent and spontaneous with normal comprehension.  Cognition:     Orientation to time, place and person     Normal recent and remote memory     Normal Attention span and concentration     Normal Language, naming, repeating,spontaneous speech     Fund of knowledge   CRANIAL NERVES: CN II: Visual fields are full to confrontation. Pupils are round equal and briskly reactive to light. CN III, IV, VI: extraocular movement are normal. No ptosis. CN V: Facial sensation is intact to light touch CN VII: Face is symmetric with normal eye closure  CN VIII: Hard of hearing on casual conversation CN IX, X: Phonation is normal. CN XI: Head turning and shoulder shrug are intact  MOTOR: There is no pronator drift of out-stretched arms. Muscle bulk and tone are normal. Muscle strength is normal.  REFLEXES: Reflexes are 2+ and symmetric at the biceps, triceps, knees, and ankles. Plantar responses are flexor.  SENSORY: Intact to light touch, pinprick and vibratory sensation are intact in fingers and toes.  COORDINATION: There is no trunk or limb dysmetria noted.  GAIT/STANCE: Posture is normal. Gait is steady with normal steps, base, arm swing, and turning. Heel and toe walking  are normal. Tandem gait is normal.  Romberg is absent.   DIAGNOSTIC DATA (LABS, IMAGING, TESTING) - I reviewed patient records, labs, notes, testing and imaging myself where available.   ASSESSMENT AND PLAN  [name] is a 74 y.o. male   Transient confusion, blurry vision on May 04, 2020  Multiple vascular risk factors, coronary artery disease, atrial fibrillation, hypertension, hyperlipidemia  Long history of migraine headaches often preceded by prolonged visual aura  Differentiation diagnosis include stroke, versus migraine  Complete evaluation with MRI of brain  He is already on anticoagulation Eliquis 5 mg daily plus Plavix 75 mg daily, keep current medications,  Ultrasound of carotid artery showed no significant abnormality  Echo cardiogram is pending  Return to clinic in 3 months with Thea Alken, M.D. Ph.D.  [facility] [address] [place], [place] [address] Ph: [phone] Fax: [phone]  CC:  Liliane Shi, PA-C [address] [place],  [place] [address]

## 2020-05-17 NOTE — Telephone Encounter (Signed)
[employer] medicare order sent to [facility]. They will reach out to the patient to schedule and obtain the auth.

## 2020-05-21 ENCOUNTER — Other Ambulatory Visit: Payer: Medicare HMO

## 2020-05-21 NOTE — Telephone Encounter (Signed)
Bernadene Person Josem Kaufmann: [id number] (exp. 05/18/20 to 11/14/20)

## 2020-05-25 ENCOUNTER — Other Ambulatory Visit: Payer: Self-pay

## 2020-05-25 ENCOUNTER — Ambulatory Visit (HOSPITAL_COMMUNITY): Payer: Medicare HMO | Attending: Cardiology

## 2020-05-25 ENCOUNTER — Encounter: Payer: Self-pay | Admitting: Physician Assistant

## 2020-05-25 DIAGNOSIS — G459 Transient cerebral ischemic attack, unspecified: Secondary | ICD-10-CM

## 2020-05-25 LAB — ECHOCARDIOGRAM COMPLETE
Area-P 1/2: 3.34 cm2
S' Lateral: 3.5 cm

## 2020-05-30 ENCOUNTER — Ambulatory Visit
Admission: RE | Admit: 2020-05-30 | Discharge: 2020-05-30 | Disposition: A | Discharge status: Home / Self Care | Payer: Medicare HMO | Source: Ambulatory Visit | Attending: Neurology | Admitting: Neurology

## 2020-05-30 ENCOUNTER — Other Ambulatory Visit: Payer: Self-pay

## 2020-05-30 DIAGNOSIS — H539 Unspecified visual disturbance: Secondary | ICD-10-CM | POA: Diagnosis not present

## 2020-05-30 DIAGNOSIS — R410 Disorientation, unspecified: Secondary | ICD-10-CM | POA: Diagnosis not present

## 2020-06-01 ENCOUNTER — Telehealth: Payer: Self-pay | Admitting: *Deleted

## 2020-06-01 ENCOUNTER — Other Ambulatory Visit: Payer: Self-pay | Admitting: Internal Medicine

## 2020-06-01 ENCOUNTER — Other Ambulatory Visit: Payer: Self-pay | Admitting: Physician Assistant

## 2020-06-01 NOTE — Telephone Encounter (Signed)
-----   Message from Penni Bombard, MD sent at 05/31/2020  6:11 PM EDT ----- Unremarkable imaging results. Please call patient. Continue current plan. -VRP

## 2020-06-01 NOTE — Telephone Encounter (Signed)
I called the patient and reviewed the MRI brain results below. He verbalized understanding and will keep his follow up that is scheduled in November.

## 2020-06-12 ENCOUNTER — Other Ambulatory Visit: Payer: Self-pay

## 2020-06-12 ENCOUNTER — Encounter: Payer: Self-pay | Admitting: Cardiovascular Disease

## 2020-06-12 ENCOUNTER — Ambulatory Visit: Payer: Medicare HMO | Admitting: Cardiovascular Disease

## 2020-06-12 VITALS — BP 116/72 | HR 76 | Ht 76.0 in | Wt 216.0 lb

## 2020-06-12 DIAGNOSIS — N183 Chronic kidney disease, stage 3 unspecified: Secondary | ICD-10-CM | POA: Diagnosis not present

## 2020-06-12 DIAGNOSIS — I10 Essential (primary) hypertension: Secondary | ICD-10-CM | POA: Diagnosis not present

## 2020-06-12 DIAGNOSIS — I2510 Atherosclerotic heart disease of native coronary artery without angina pectoris: Secondary | ICD-10-CM | POA: Diagnosis not present

## 2020-06-12 DIAGNOSIS — E785 Hyperlipidemia, unspecified: Secondary | ICD-10-CM | POA: Diagnosis not present

## 2020-06-12 DIAGNOSIS — I4821 Permanent atrial fibrillation: Secondary | ICD-10-CM

## 2020-06-12 NOTE — Patient Instructions (Signed)
Medication Instructions:  Your physician recommends that you continue on your current medications as directed. Please refer to the Current Medication list given to you today.  *If you need a refill on your cardiac medications before your next appointment, please call your pharmacy*   Lab Work: Your physician recommends that you return for lab work in: 12 months on the day of or a few days before your office visit with Dr. Acie Fredrickson.  You will need to FAST for this appointment - nothing to eat or drink after midnight the night before except water.   If you have labs (blood work) drawn today and your tests are completely normal, you will receive your results only by: Marland Kitchen MyChart Message (if you have MyChart) OR . A paper copy in the mail If you have any lab test that is abnormal or we need to change your treatment, we will call you to review the results.   Testing/Procedures: None Ordered   Follow-Up: At [facility], you and your health needs are our priority.  As part of our continuing mission to provide you with exceptional heart care, we have created designated Provider Care Teams.  These Care Teams include your primary Cardiologist (physician) and Advanced Practice Providers (APPs -  Physician Assistants and Nurse Practitioners) who all work together to provide you with the care you need, when you need it.  We recommend signing up for the patient portal called "MyChart".  Sign up information is provided on this After Visit Summary.  MyChart is used to connect with patients for Virtual Visits (Telemedicine).  Patients are able to view lab/test results, encounter notes, upcoming appointments, etc.  Non-urgent messages can be sent to your provider as well.   To learn more about what you can do with MyChart, go to [email or web address].    Your next appointment:   1 year(s)  The format for your next appointment:   In Person  Provider:   You may see Mertie Moores, MD or one of the  following Advanced Practice Providers on your designated Care Team:    [name], PA-C  [place], [place]

## 2020-06-12 NOTE — Progress Notes (Signed)
Cardiology Office Note:    Date:  06/12/2020   ID:  [name], DOB [birth year], MRN [id number]  PCP:  Binnie Rail, MD  Cardiologist:  Mertie Moores, MD  Electrophysiologist:  None   Referring MD: Binnie Rail, MD   Chief Complaint  Patient presents with  . Coronary Artery Disease    Previous notes.    [name] is a 74 y.o. male with a hx of history of coronary artery disease.  He status post non-ST segment elevation myocardial infarction in 2011.  He status post DES to the RCA, status post DES to the LAD in 2015, status post DES to the RCA in 2016.  He has mildly reduced left ventricular systolic function by echo in October, 2020.  He also has a history of hypertension, hyperlipidemia, CKD, pulmonary embolus, persistent atrial fibrillation and is on Eliquis.  This is the first time meeting him.   No cp.  Is active , does lots of yard work .   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, 2021: [name] is seen today for follow-up.  He has a history of coronary artery disease.  Status post stenting of his right coronary artery and his left and descending artery.  He has mildly reduced left ventricular systolic function.  He also has a history of hypertension, hyperlipidemia, CKD, pulmonary embolus and persistent atrial fibrillation.  He is on Eliquis.  Saw Scott weaver in early august.  Had some tunnel  Vision,  Thought to have a TIA  CT of the head did not reveal any specific abnormality.  Brain MRI did not reveal any evidence of stroke or TIA.  It revealed only small vessel changes.  TSH was mildly elevated.   I asked him to consult with his medical doctor for further advice on that.    Past Medical History:  Diagnosis Date  . Anemia, mild   . CAD (coronary artery disease)    a. 02/2010 NSTEMI/PCI: PROMUS DES to mRCA. b. 06/05/14 Cath/PCI: pLAD 70%--> s/p PCI/DES (Promus DES);  c. 01/2015 Cath/PCI: LM nl, LAD 69m, patent prox stent, LCX patent  prox stent, RCA 30p, 32m (3.5x20 Synergy DES). // Myoview 07/2019:  EF 48, atten artifact, no ischemia, intermediate risk (low EF)   . CKD (chronic kidney disease), stage III   . Diastolic dysfunction    a. Echo 6/11: mild LVH, EF 55-60%, GR1DD, Trivial MR, mild LAE. b. echo 06/04/14: EF 55-60%, no WMA, GR1DD, Ao valve mildly calcifed, mild MR, mild LAE  . History of pulmonary embolism   . Hyperlipidemia   . Hypertension    did not tolerate Lisinopril event at low dose  . Melanoma ([place])    a. 1987. b. 2012  . Myocardial infarction ([place])   . Persistent atrial fibrillation ([place]) 12/03/2018   Dx 11/2018 >> Apixaban started // Echo 12/2018: EF 55-60, mod RVE, normal RVSF, mild LAE, trivial MR, mild TR, mod AoV sclerosis   . Prostate cancer ([place])    Dr Karsten Ro  . Stroke ([place])   . TIA (transient ischemic attack)    a. post cath and PCI on 06/05/2014 - no residual sequelae. // ?recurrent TIA 8/21 >> Carotid US 8/21: normal  // Echocardiogram 8/21: EF 55-60, no RWMA, normal RVSF, mild BAE, trivial MR, trivial AI   . Vitamin B12 deficiency     Past Surgical History:  Procedure Laterality Date  . CHOLECYSTECTOMY    . COLONOSCOPY  2006  . CORONARY STENT INTERVENTION N/A 08/05/2019   Procedure: CORONARY STENT INTERVENTION;  Surgeon: Jettie Booze, MD;  Location: [facility];  Service: Cardiovascular;  Laterality: N/A;  . KNEE ARTHROSCOPY     right  . LEFT HEART CATH AND CORONARY ANGIOGRAPHY N/A 08/05/2019   Procedure: LEFT HEART CATH AND CORONARY ANGIOGRAPHY;  Surgeon: Jettie Booze, MD;  Location: [facility];  Service: Cardiovascular;  Laterality: N/A;  . LEFT HEART CATHETERIZATION WITH CORONARY ANGIOGRAM N/A 06/05/2014   Procedure: LEFT HEART CATHETERIZATION WITH CORONARY ANGIOGRAM;  Surgeon: Blane Ohara, MD;  Location: [facility] CATH LAB;  Service: Cardiovascular;  Laterality: N/A;  . LEFT HEART CATHETERIZATION WITH CORONARY ANGIOGRAM N/A 01/19/2015   Procedure: LEFT  HEART CATHETERIZATION WITH CORONARY ANGIOGRAM;  Surgeon: Peter M Martinique, MD;  Location: [facility] CATH LAB;  Service: Cardiovascular;  Laterality: N/A;  . MELANOMA EXCISION  2011    L chest  . MELANOMA EXCISION  1991   back  . mRCA stent  02/2010  . PROSTATECTOMY     Dr. Karsten Ro  . TONSILLECTOMY      Current Medications: Current Meds  Medication Sig  . allopurinol (ZYLOPRIM) 100 MG tablet Take 1 tablet (100 mg total) by mouth daily. Annual appt is due must see provider for future refills  . calcium carbonate (TUMS - DOSED IN MG ELEMENTAL CALCIUM) 500 MG chewable tablet Chew 2 tablets by mouth daily as needed for indigestion or heartburn.   . clopidogrel (PLAVIX) 75 MG tablet TAKE 1 TABLET (75 MG TOTAL) BY MOUTH DAILY WITH BREAKFAST.  Marland Kitchen Cyanocobalamin (VITAMIN B12 PO) Take 1 tablet by mouth every evening.  . diltiazem (CARDIZEM CD) 240 MG 24 hr capsule TAKE 1 CAPSULE BY MOUTH EVERY DAY  . ELIQUIS 5 MG TABS tablet TAKE 1 TABLET BY MOUTH TWICE A DAY  . ezetimibe (ZETIA) 10 MG tablet TAKE 1 TABLET BY MOUTH EVERY DAY  . fish oil-omega-3 fatty acids 1000 MG capsule Take 2 g by mouth 2 (two) times daily.    . metoprolol tartrate (LOPRESSOR) 50 MG tablet TAKE 1 TABLET BY MOUTH TWICE A DAY  . Misc Natural Products (TART CHERRY ADVANCED PO) Take 1,000 mg by mouth daily.  . nitroGLYCERIN (NITROSTAT) 0.4 MG SL tablet Place 1 tablet (0.4 mg total) under the tongue every 5 (five) minutes x 3 doses as needed for chest pain.  Marland Kitchen Potassium Gluconate 2 MEQ TABS Take 1 tablet by mouth daily.  . rosuvastatin (CRESTOR) 20 MG tablet Take 1 tablet (20 mg total) by mouth daily.     Allergies:   Nitroglycerin and Lidocaine   Social History   Socioeconomic History  . Marital status: Married    Spouse name: Not on file  . Number of children: Not on file  . Years of education: Not on file  . Highest education level: Not on file  Occupational History  . Not on file  Tobacco Use  . Smoking status: Never Smoker    . Smokeless tobacco: Never Used  Vaping Use  . Vaping Use: Never used  Substance and Sexual Activity  . Alcohol use: No    Alcohol/week: 0.0 standard drinks  . Drug use: No  . Sexual activity: Not on file  Other Topics Concern  . Not on file  Social History Narrative   Right handed    Caffeine- 2-3 cups per day    Social Determinants of Health   Financial Resource Strain:   . Difficulty  of Paying Living Expenses: Not on file  Food Insecurity:   . Worried About Charity fundraiser in the Last Year: Not on file  . Ran Out of Food in the Last Year: Not on file  Transportation Needs:   . Lack of Transportation (Medical): Not on file  . Lack of Transportation (Non-Medical): Not on file  Physical Activity:   . Days of Exercise per Week: Not on file  . Minutes of Exercise per Session: Not on file  Stress:   . Feeling of Stress : Not on file  Social Connections:   . Frequency of Communication with Friends and Family: Not on file  . Frequency of Social Gatherings with Friends and Family: Not on file  . Attends Religious Services: Not on file  . Active Member of Clubs or Organizations: Not on file  . Attends Archivist Meetings: Not on file  . Marital Status: Not on file     Family History: The patient's family history includes Coronary artery disease in his mother; Diabetes in his sister; Heart disease in his mother; Hypertension in his brother; Prostate cancer in his father; Transient ischemic attack (age of onset: 45) in his mother.  ROS:   Please see the history of present illness.     All other systems reviewed and are negative.  EKGs/Labs/Other Studies Reviewed:    The following studies were reviewed today:    EKG:     Recent Labs: 05/11/2020: ALT 30; BUN 18; Creatinine, Ser 1.86; Hemoglobin 14.5; Platelets 146; Potassium 4.9; Sodium 142; TSH 5.420  Recent Lipid Panel    Component Value Date/Time   CHOL 106 05/11/2020 1249   CHOL 122 (L) 02/07/2016 0745    TRIG 96 05/11/2020 1249   TRIG 113 02/07/2016 0745   TRIG 220 (HH) 09/07/2006 1105   HDL 42 05/11/2020 1249   HDL 38 (L) 02/07/2016 0745   CHOLHDL 2.5 05/11/2020 1249   CHOLHDL 3 09/14/2018 1130   VLDL 24.4 09/14/2018 1130   LDLCALC 46 05/11/2020 1249   LDLCALC 61 02/07/2016 0745   LDLDIRECT 68.8 07/20/2012 0837    Physical Exam:    Physical Exam: Blood pressure 116/72, pulse 76, height 6\' 4"  (1.93 m), weight 216 lb (98 kg), SpO2 98 %.  GEN:  Well nourished, well developed in no acute distress HEENT: Normal NECK: No JVD; No carotid bruits LYMPHATICS: No lymphadenopathy CARDIAC: RRR , no murmurs, rubs, gallops RESPIRATORY:  Clear to auscultation without rales, wheezing or rhonchi  ABDOMEN: Soft, non-tender, non-distended MUSCULOSKELETAL:  No edema; No deformity  SKIN: Warm and dry NEUROLOGIC:  Alert and oriented x 3     ASSESSMENT:    1. Coronary artery disease involving native coronary artery of native heart without angina pectoris   2. Essential hypertension   3. Hyperlipidemia LDL goal <70   4. Stage 3 chronic kidney disease, unspecified whether stage 3a or 3b CKD    PLAN:       1.  Coronary artery disease:  No angina  Seems to be doing well  2.  Atrial fibrillation:  Cont eliquis   3.  Hyperlipidemia- labs checked today     4.  Chronic kidney disease:    Medication Adjustments/Labs and Tests Ordered: Current medicines are reviewed at length with the patient today.  Concerns regarding medicines are outlined above.    Orders Placed This Encounter  Procedures  . Lipid Profile  . Basic Metabolic Panel (BMET)  . Hepatic function panel  No orders of the defined types were placed in this encounter.   Patient Instructions  Medication Instructions:  Your physician recommends that you continue on your current medications as directed. Please refer to the Current Medication list given to you today.  *If you need a refill on your cardiac medications  before your next appointment, please call your pharmacy*   Lab Work: Your physician recommends that you return for lab work in: 12 months on the day of or a few days before your office visit with Dr. Acie Fredrickson.  You will need to FAST for this appointment - nothing to eat or drink after midnight the night before except water.   If you have labs (blood work) drawn today and your tests are completely normal, you will receive your results only by: Marland Kitchen MyChart Message (if you have MyChart) OR . A paper copy in the mail If you have any lab test that is abnormal or we need to change your treatment, we will call you to review the results.   Testing/Procedures: None Ordered   Follow-Up: At [facility], you and your health needs are our priority.  As part of our continuing mission to provide you with exceptional heart care, we have created designated Provider Care Teams.  These Care Teams include your primary Cardiologist (physician) and Advanced Practice Providers (APPs -  Physician Assistants and Nurse Practitioners) who all work together to provide you with the care you need, when you need it.  We recommend signing up for the patient portal called "MyChart".  Sign up information is provided on this After Visit Summary.  MyChart is used to connect with patients for Virtual Visits (Telemedicine).  Patients are able to view lab/test results, encounter notes, upcoming appointments, etc.  Non-urgent messages can be sent to your provider as well.   To learn more about what you can do with MyChart, go to [email or web address].    Your next appointment:   1 year(s)  The format for your next appointment:   In Person  Provider:   You may see Mertie Moores, MD or one of the following Advanced Practice Providers on your designated Care Team:    [name], PA-C  Robbie Lis, [place]        Signed, Mertie Moores, MD  06/12/2020 5:31 PM    [place]

## 2020-07-09 DIAGNOSIS — R69 Illness, unspecified: Secondary | ICD-10-CM | POA: Diagnosis not present

## 2020-07-26 ENCOUNTER — Other Ambulatory Visit: Payer: Self-pay | Admitting: Internal Medicine

## 2020-08-09 NOTE — Progress Notes (Signed)
Subjective:    Patient ID: [name], male    DOB: 1945/11/21, 74 y.o.   MRN: [id number]  HPI The patient is here for an acute visit.   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  Last Sunday the pain was pretty bad, but when he sat in the recliner it was good - no pain and when he went to sit up he had pain.  The pain was bad Sunday and Monday but is better now  He almost cancelled yesterday.   When he goes to stand he has a twinge of pain, but sitting he has no pain.  He can have some tenderness with pressing in that area.     He denies other symptoms.      Medications and allergies reviewed with patient and updated if appropriate.  Patient Active Problem List   Diagnosis Date Noted  . LLQ pain 08/10/2020  . Vision changes 05/17/2020  . Confusion 05/17/2020  . Right flank pain 01/27/2020  . Erysipelas 09/12/2019  . Progressive angina ([place]) 08/04/2019  . Gouty arthritis of left great toe 08/02/2019  . Persistent atrial fibrillation ([place]), rapid ventricular response 12/03/2018  . Umbilical hernia without obstruction or gangrene 09/14/2018  . B12 deficiency 09/14/2018  . Erectile dysfunction 08/04/2016  . Elevated TSH 08/29/2015  . S/P coronary artery stent placement, 01/19/15 S/p DES of mid RCA 01/20/2015  . Unstable angina ([place])   . Prediabetes 10/03/2014  . TIA (transient ischemic attack) 07/31/2014  . Cerebral infarction ([place]) 06/05/2014  . NSTEMI (non-ST elevated myocardial infarction) ([place]) 06/04/2014  . Chronic kidney disease (CKD), stage III (moderate) ([place]) 06/14/2010  . Coronary artery disease involving native coronary artery of native heart without angina pectoris 03/01/2010  . TREMOR, ESSENTIAL, RIGHT HAND 04/24/2009  . PROSTATE CANCER, HX OF 04/24/2009  . Melanoma ([place]) 04/24/2009  . Hyperlipidemia LDL goal <70 10/28/2007  . Essential hypertension 10/13/2007    Current Outpatient Medications on File Prior to Visit  Medication  Sig Dispense Refill  . calcium carbonate (TUMS - DOSED IN MG ELEMENTAL CALCIUM) 500 MG chewable tablet Chew 2 tablets by mouth daily as needed for indigestion or heartburn.     . clopidogrel (PLAVIX) 75 MG tablet TAKE 1 TABLET (75 MG TOTAL) BY MOUTH DAILY WITH BREAKFAST. 90 tablet 2  . Cyanocobalamin (VITAMIN B12 PO) Take 1 tablet by mouth every evening.    . diltiazem (CARDIZEM CD) 240 MG 24 hr capsule TAKE 1 CAPSULE BY MOUTH EVERY DAY 90 capsule 2  . ELIQUIS 5 MG TABS tablet TAKE 1 TABLET BY MOUTH TWICE A DAY 180 tablet 1  . ezetimibe (ZETIA) 10 MG tablet TAKE 1 TABLET BY MOUTH EVERY DAY 90 tablet 2  . fish oil-omega-3 fatty acids 1000 MG capsule Take 2 g by mouth 2 (two) times daily.      . metoprolol tartrate (LOPRESSOR) 50 MG tablet TAKE 1 TABLET BY MOUTH TWICE A DAY 180 tablet 2  . Misc Natural Products (TART CHERRY ADVANCED PO) Take 1,000 mg by mouth daily.    . nitroGLYCERIN (NITROSTAT) 0.4 MG SL tablet Place 1 tablet (0.4 mg total) under the tongue every 5 (five) minutes x 3 doses as needed for chest pain. 25 tablet 4  . Potassium Gluconate 2 MEQ TABS Take 1 tablet by mouth daily.    . rosuvastatin (CRESTOR) 20 MG tablet Take 1 tablet (20 mg total) by mouth daily. 90 tablet 3  No current facility-administered medications on file prior to visit.    Past Medical History:  Diagnosis Date  . Anemia, mild   . CAD (coronary artery disease)    a. 02/2010 NSTEMI/PCI: PROMUS DES to mRCA. b. 06/05/14 Cath/PCI: pLAD 70%--> s/p PCI/DES (Promus DES);  c. 01/2015 Cath/PCI: LM nl, LAD 36m, patent prox stent, LCX patent prox stent, RCA 30p, 75m (3.5x20 Synergy DES). // Myoview 07/2019:  EF 48, atten artifact, no ischemia, intermediate risk (low EF)   . CKD (chronic kidney disease), stage III ([place])   . Diastolic dysfunction    a. Echo 6/11: mild LVH, EF 55-60%, GR1DD, Trivial MR, mild LAE. b. echo 06/04/14: EF 55-60%, no WMA, GR1DD, Ao valve mildly calcifed, mild MR, mild LAE  . History of pulmonary  embolism   . Hyperlipidemia   . Hypertension    did not tolerate Lisinopril event at low dose  . Melanoma ([place])    a. 1987. b. 2012  . Myocardial infarction ([place])   . Persistent atrial fibrillation ([place]) 12/03/2018   Dx 11/2018 >> Apixaban started // Echo 12/2018: EF 55-60, mod RVE, normal RVSF, mild LAE, trivial MR, mild TR, mod AoV sclerosis   . Prostate cancer ([place])    Dr Karsten Ro  . Stroke ([place])   . TIA (transient ischemic attack)    a. post cath and PCI on 06/05/2014 - no residual sequelae. // ?recurrent TIA 8/21 >> Carotid US 8/21: normal  // Echocardiogram 8/21: EF 55-60, no RWMA, normal RVSF, mild BAE, trivial MR, trivial AI   . Vitamin B12 deficiency     Past Surgical History:  Procedure Laterality Date  . CHOLECYSTECTOMY    . COLONOSCOPY  2006  . CORONARY STENT INTERVENTION N/A 08/05/2019   Procedure: CORONARY STENT INTERVENTION;  Surgeon: Jettie Booze, MD;  Location: [facility];  Service: Cardiovascular;  Laterality: N/A;  . KNEE ARTHROSCOPY     right  . LEFT HEART CATH AND CORONARY ANGIOGRAPHY N/A 08/05/2019   Procedure: LEFT HEART CATH AND CORONARY ANGIOGRAPHY;  Surgeon: Jettie Booze, MD;  Location: [facility];  Service: Cardiovascular;  Laterality: N/A;  . LEFT HEART CATHETERIZATION WITH CORONARY ANGIOGRAM N/A 06/05/2014   Procedure: LEFT HEART CATHETERIZATION WITH CORONARY ANGIOGRAM;  Surgeon: Blane Ohara, MD;  Location: [facility] CATH LAB;  Service: Cardiovascular;  Laterality: N/A;  . LEFT HEART CATHETERIZATION WITH CORONARY ANGIOGRAM N/A 01/19/2015   Procedure: LEFT HEART CATHETERIZATION WITH CORONARY ANGIOGRAM;  Surgeon: Peter M Martinique, MD;  Location: [facility] CATH LAB;  Service: Cardiovascular;  Laterality: N/A;  . MELANOMA EXCISION  2011    L chest  . MELANOMA EXCISION  1991   back  . mRCA stent  02/2010  . PROSTATECTOMY     Dr. Karsten Ro  . TONSILLECTOMY      Social History   Socioeconomic History  . Marital status: Married    Spouse  name: Not on file  . Number of children: Not on file  . Years of education: Not on file  . Highest education level: Not on file  Occupational History  . Not on file  Tobacco Use  . Smoking status: Never Smoker  . Smokeless tobacco: Never Used  Vaping Use  . Vaping Use: Never used  Substance and Sexual Activity  . Alcohol use: No    Alcohol/week: 0.0 standard drinks  . Drug use: No  . Sexual activity: Not on file  Other Topics Concern  . Not on file  Social  History Narrative   Right handed    Caffeine- 2-3 cups per day    Social Determinants of Health   Financial Resource Strain:   . Difficulty of Paying Living Expenses: Not on file  Food Insecurity:   . Worried About Charity fundraiser in the Last Year: Not on file  . Ran Out of Food in the Last Year: Not on file  Transportation Needs:   . Lack of Transportation (Medical): Not on file  . Lack of Transportation (Non-Medical): Not on file  Physical Activity:   . Days of Exercise per Week: Not on file  . Minutes of Exercise per Session: Not on file  Stress:   . Feeling of Stress : Not on file  Social Connections:   . Frequency of Communication with Friends and Family: Not on file  . Frequency of Social Gatherings with Friends and Family: Not on file  . Attends Religious Services: Not on file  . Active Member of Clubs or Organizations: Not on file  . Attends Archivist Meetings: Not on file  . Marital Status: Not on file    Family History  Problem Relation Age of Onset  . Coronary artery disease Mother        S/P CABG, in her 15s  . Transient ischemic attack Mother 26  . Heart disease Mother   . Prostate cancer Father   . Hypertension Brother   . Diabetes Sister     Review of Systems  Constitutional: Negative for chills and fever.  Gastrointestinal: Positive for abdominal pain (LLQ). Negative for blood in stool, constipation, diarrhea and nausea.  Genitourinary: Negative for difficulty urinating,  dysuria, frequency, hematuria and urgency.       Objective:   Vitals:   08/10/20 0840  BP: 124/72  Pulse: 79  Temp: 98.5 F (36.9 C)  SpO2: 98%   BP Readings from Last 3 Encounters:  08/10/20 124/72  06/12/20 116/72  05/17/20 112/84   Wt Readings from Last 3 Encounters:  08/10/20 218 lb (98.9 kg)  06/12/20 216 lb (98 kg)  05/17/20 215 lb (97.5 kg)   Body mass index is 26.54 kg/m.   Physical Exam Constitutional:      General: He is not in acute distress.    Appearance: Normal appearance. He is not ill-appearing.  HENT:     Head: Normocephalic and atraumatic.  Abdominal:     General: There is no distension.     Palpations: Abdomen is soft. There is no mass.     Tenderness: There is abdominal tenderness (focal tenderness LLQ.  no tenderness along scars from previous surgeries). There is no left CVA tenderness, guarding or rebound.     Hernia: A hernia (umbilical - mild - non tender) is present.  Skin:    General: Skin is warm and dry.  Neurological:     Mental Status: He is alert.            Assessment & Plan:    See Problem List for Assessment and Plan of chronic medical problems.    This visit occurred during the SARS-CoV-2 public health emergency.  Safety protocols were in place, including screening questions prior to the visit, additional usage of staff PPE, and extensive cleaning of exam room while observing appropriate contact time as indicated for disinfecting solutions.

## 2020-08-10 ENCOUNTER — Other Ambulatory Visit: Payer: Self-pay

## 2020-08-10 ENCOUNTER — Ambulatory Visit (INDEPENDENT_AMBULATORY_CARE_PROVIDER_SITE_OTHER): Payer: Medicare HMO | Admitting: Internal Medicine

## 2020-08-10 ENCOUNTER — Encounter: Payer: Self-pay | Admitting: Internal Medicine

## 2020-08-10 VITALS — BP 124/72 | HR 79 | Temp 98.5°F | Ht 76.0 in | Wt 218.0 lb

## 2020-08-10 DIAGNOSIS — R1032 Left lower quadrant pain: Secondary | ICD-10-CM | POA: Diagnosis not present

## 2020-08-10 MED ORDER — ALLOPURINOL 100 MG PO TABS
100.0000 mg | ORAL_TABLET | Freq: Every day | ORAL | 3 refills | Status: DC
Start: 2020-08-10 — End: 2021-08-26

## 2020-08-10 NOTE — Assessment & Plan Note (Signed)
Acute Started about a week ago and has improved When the pain was the worst it has been he did not have pain at rest lying flat and only had pain with movement Pain is minimal at this time No GI or GU symptoms On exam, but mild Most likely muscular skeletal strain We discussed options and we both agreed to hold off on further evaluation at this time unless his pain gets worse or does not feel like If his pain worsens and does not go away he will contact me and we will consider blood work, urine tests and possible imaging

## 2020-08-10 NOTE — Patient Instructions (Signed)
Your pain is likely a muscular strain.   Monitor your symptoms and let me know if it does not go away completely.

## 2020-08-20 ENCOUNTER — Ambulatory Visit: Payer: Medicare HMO | Admitting: Neurology

## 2020-08-27 ENCOUNTER — Other Ambulatory Visit: Payer: Medicare HMO

## 2020-10-01 ENCOUNTER — Telehealth: Payer: Self-pay | Admitting: Neurology

## 2020-10-01 NOTE — Telephone Encounter (Signed)
Pt called in asking why the appt was needed for this week with the NP. Advised the patient that in reviewing the chart it appeared the apt was scheduled at the time of his visit in aug for 3 mth follow up this apt was to discuss the testing that was completed. Pt stated that since everything was normal he didn't understand why he would need to be seen. He recently saw his cardiologist and his PCP who stated that he was doing well and there were no medication changes needed. Pt states at this time he would like to cancel the apt for now. Offered to reschedule and patient states at this time he will contact us if needed.

## 2020-10-03 ENCOUNTER — Ambulatory Visit: Payer: Medicare HMO | Admitting: Neurology

## 2020-10-04 ENCOUNTER — Other Ambulatory Visit: Payer: Self-pay | Admitting: Cardiovascular Disease

## 2020-10-08 ENCOUNTER — Other Ambulatory Visit: Payer: Self-pay | Admitting: Cardiovascular Disease

## 2020-10-08 DIAGNOSIS — I4819 Other persistent atrial fibrillation: Secondary | ICD-10-CM

## 2020-10-08 NOTE — Telephone Encounter (Signed)
Prescription refill request for Eliquis received. Indication: a fib Last office visit: 05/11/20 Scr: 1.86 Age: 75 Weight: 98kg

## 2020-10-22 ENCOUNTER — Other Ambulatory Visit: Payer: Self-pay

## 2020-10-22 MED ORDER — ROSUVASTATIN CALCIUM 20 MG PO TABS
20.0000 mg | ORAL_TABLET | Freq: Every day | ORAL | 2 refills | Status: DC
Start: 2020-10-22 — End: 2021-07-16

## 2020-12-03 DIAGNOSIS — K219 Gastro-esophageal reflux disease without esophagitis: Secondary | ICD-10-CM | POA: Diagnosis not present

## 2020-12-03 DIAGNOSIS — K5900 Constipation, unspecified: Secondary | ICD-10-CM | POA: Diagnosis not present

## 2020-12-03 DIAGNOSIS — M109 Gout, unspecified: Secondary | ICD-10-CM | POA: Diagnosis not present

## 2020-12-03 DIAGNOSIS — I2510 Atherosclerotic heart disease of native coronary artery without angina pectoris: Secondary | ICD-10-CM | POA: Diagnosis not present

## 2020-12-03 DIAGNOSIS — Z7901 Long term (current) use of anticoagulants: Secondary | ICD-10-CM | POA: Diagnosis not present

## 2020-12-03 DIAGNOSIS — Z7902 Long term (current) use of antithrombotics/antiplatelets: Secondary | ICD-10-CM | POA: Diagnosis not present

## 2020-12-03 DIAGNOSIS — Z809 Family history of malignant neoplasm, unspecified: Secondary | ICD-10-CM | POA: Diagnosis not present

## 2020-12-03 DIAGNOSIS — E785 Hyperlipidemia, unspecified: Secondary | ICD-10-CM | POA: Diagnosis not present

## 2020-12-03 DIAGNOSIS — I10 Essential (primary) hypertension: Secondary | ICD-10-CM | POA: Diagnosis not present

## 2020-12-03 DIAGNOSIS — I252 Old myocardial infarction: Secondary | ICD-10-CM | POA: Diagnosis not present

## 2021-01-14 DIAGNOSIS — L814 Other melanin hyperpigmentation: Secondary | ICD-10-CM | POA: Diagnosis not present

## 2021-01-14 DIAGNOSIS — L821 Other seborrheic keratosis: Secondary | ICD-10-CM | POA: Diagnosis not present

## 2021-01-14 DIAGNOSIS — L570 Actinic keratosis: Secondary | ICD-10-CM | POA: Diagnosis not present

## 2021-01-14 DIAGNOSIS — D225 Melanocytic nevi of trunk: Secondary | ICD-10-CM | POA: Diagnosis not present

## 2021-01-14 DIAGNOSIS — D1801 Hemangioma of skin and subcutaneous tissue: Secondary | ICD-10-CM | POA: Diagnosis not present

## 2021-01-19 ENCOUNTER — Other Ambulatory Visit: Payer: Self-pay | Admitting: Cardiovascular Disease

## 2021-01-19 DIAGNOSIS — Z5181 Encounter for therapeutic drug level monitoring: Secondary | ICD-10-CM

## 2021-01-19 DIAGNOSIS — I4819 Other persistent atrial fibrillation: Secondary | ICD-10-CM

## 2021-01-21 NOTE — Telephone Encounter (Signed)
Prescription refill request for Eliquis received. Indication: TIA Last office visit: 07/02/20 Scr: 1.86 on 05/11/20 Age:  75 Weight: 98kg  Based on above findings Eliquis 5mg  twice daily is the appropriate dose.  Refill approved.  Is due for CBC/BMP.  Lab request mailed to patient.

## 2021-01-29 ENCOUNTER — Other Ambulatory Visit: Payer: Medicare HMO | Admitting: *Deleted

## 2021-01-29 ENCOUNTER — Other Ambulatory Visit: Payer: Self-pay

## 2021-01-29 DIAGNOSIS — I2510 Atherosclerotic heart disease of native coronary artery without angina pectoris: Secondary | ICD-10-CM | POA: Diagnosis not present

## 2021-01-29 DIAGNOSIS — Z5181 Encounter for therapeutic drug level monitoring: Secondary | ICD-10-CM

## 2021-01-29 DIAGNOSIS — I4819 Other persistent atrial fibrillation: Secondary | ICD-10-CM

## 2021-01-29 DIAGNOSIS — I10 Essential (primary) hypertension: Secondary | ICD-10-CM

## 2021-01-29 DIAGNOSIS — I4821 Permanent atrial fibrillation: Secondary | ICD-10-CM

## 2021-01-29 DIAGNOSIS — N183 Chronic kidney disease, stage 3 unspecified: Secondary | ICD-10-CM

## 2021-01-29 LAB — BASIC METABOLIC PANEL
BUN/Creatinine Ratio: 11 (ref 10–24)
BUN: 22 mg/dL (ref 8–27)
CO2: 27 mmol/L (ref 20–29)
Calcium: 9.7 mg/dL (ref 8.6–10.2)
Chloride: 106 mmol/L (ref 96–106)
Creatinine, Ser: 2 mg/dL — ABNORMAL HIGH (ref 0.76–1.27)
Glucose: 100 mg/dL — ABNORMAL HIGH (ref 65–99)
Potassium: 5.1 mmol/L (ref 3.5–5.2)
Sodium: 142 mmol/L (ref 134–144)
eGFR: 34 mL/min/{1.73_m2} — ABNORMAL LOW (ref >59)

## 2021-01-29 LAB — CBC
Hematocrit: 42.5 % (ref 37.5–51.0)
Hemoglobin: 14.3 g/dL (ref 13.0–17.7)
MCH: 28.4 pg (ref 26.6–33.0)
MCHC: 33.6 g/dL (ref 31.5–35.7)
MCV: 84 fL (ref 79–97)
Platelets: 119 10*3/uL — ABNORMAL LOW (ref 150–450)
RBC: 5.04 x10E6/uL (ref 4.14–5.80)
RDW: 15.3 % (ref 11.6–15.4)
WBC: 5.1 10*3/uL (ref 3.4–10.8)

## 2021-01-29 NOTE — Addendum Note (Signed)
Addended by: Marcelle Overlie D on: 01/29/2021 09:18 AM   Modules accepted: Orders

## 2021-04-12 ENCOUNTER — Other Ambulatory Visit: Payer: Self-pay | Admitting: Cardiovascular Disease

## 2021-04-12 DIAGNOSIS — I4819 Other persistent atrial fibrillation: Secondary | ICD-10-CM

## 2021-04-12 NOTE — Telephone Encounter (Signed)
Prescription refill request for Eliquis received. Indication:  Atrial fib Last office visit: 06/12/20  Nahser MD Scr: 2.00 on 01/29/21 Age: 75 Weight: 98kg  Based on above findings Eliquis 5mg  twice daily is the appropriate dose.  Refill approved.

## 2021-04-18 ENCOUNTER — Telehealth: Payer: Self-pay | Admitting: Internal Medicine

## 2021-04-18 NOTE — Telephone Encounter (Signed)
LVM for pt to rtn my call to schedule AWV with NHA. Please schedule AWV if pt calls the office  

## 2021-06-05 ENCOUNTER — Telehealth (INDEPENDENT_AMBULATORY_CARE_PROVIDER_SITE_OTHER): Payer: Medicare HMO | Admitting: Internal Medicine

## 2021-06-05 ENCOUNTER — Encounter: Payer: Self-pay | Admitting: Internal Medicine

## 2021-06-05 ENCOUNTER — Telehealth: Payer: Self-pay | Admitting: Internal Medicine

## 2021-06-05 DIAGNOSIS — U071 COVID-19: Secondary | ICD-10-CM | POA: Diagnosis not present

## 2021-06-05 MED ORDER — MOLNUPIRAVIR 200 MG PO CAPS: 800.0000 mg | ORAL_CAPSULE | Freq: Two times a day (BID) | ORAL | 0 refills | Status: AC

## 2021-06-05 NOTE — Telephone Encounter (Signed)
Patient tested positive for covid on 8.30.22, experiencing fever, weakness, chills, has heart issues and wants medication  Wife is asking for the infusion for her husband  Scheduled for video visit with Dr. Quay Burow at 3:40pm  Telephone advice will be in the chart under media

## 2021-06-05 NOTE — Progress Notes (Signed)
Virtual Visit via telephone note  I connected with [name] on 06/05/21 at  3:40 PM EDT by telephone that I am speaking with the correct person using two identifiers.   I discussed the limitations of evaluation and management by telemedicine and the availability of in person appointments. The patient expressed understanding and agreed to proceed.  Present for the visit:  Myself, Dr Billey Gosling, [name] and his wife [name].  The patient is currently at home and I am in the office.    No referring provider.    History of Present Illness: He is here for an acute visit for covid   Symptoms started last weekend 4-5 days ago  He started with hoarseness, chills, fever, fatigue, nasal congestion and sore throat.  He is having a significant cough that is dry, nausea headaches and at times has been disorientated.  He has been taking Rosiver Tustin DM and Tylenol.  Most recent blood pressure was 105/?  And heart rate of 61   Review of Systems  Constitutional:  Positive for chills, fever and malaise/fatigue.  HENT:  Positive for congestion and sore throat. Negative for sinus pain.   Respiratory:  Positive for cough (dry). Negative for shortness of breath and wheezing.   Gastrointestinal:  Positive for nausea.  Neurological:  Positive for headaches.  Psychiatric/Behavioral:         Disorientated     Social History   Socioeconomic History   Marital status: Married    Spouse name: Not on file   Number of children: Not on file   Years of education: Not on file   Highest education level: Not on file  Occupational History   Not on file  Tobacco Use   Smoking status: Never   Smokeless tobacco: Never  Vaping Use   Vaping Use: Never used  Substance and Sexual Activity   Alcohol use: No    Alcohol/week: 0.0 standard drinks   Drug use: No   Sexual activity: Not on file  Other Topics Concern   Not on file  Social History Narrative   Right handed    Caffeine- 2-3 cups per day     Social Determinants of Health   Financial Resource Strain: Not on file  Food Insecurity: Not on file  Transportation Needs: Not on file  Physical Activity: Not on file  Stress: Not on file  Social Connections: Not on file     Observations/Objective: GFR 34   Assessment and Plan:  See Problem List for Assessment and Plan of chronic medical problems.   Follow Up Instructions:    I discussed the assessment and treatment plan with the patient. The patient was provided an opportunity to ask questions and all were answered. The patient agreed with the plan and demonstrated an understanding of the instructions.   The patient was advised to call back or seek an in-person evaluation if the symptoms worsen or if the condition fails to improve as anticipated.  Time spent on telephone call: 15 minutes  Binnie Rail, MD

## 2021-06-05 NOTE — Assessment & Plan Note (Signed)
Acute Today is day 4-5 of symptoms He has moderate symptoms and is high risk for complications Discussed options of the 2 antiviral medications Will prescribe molnupiravir 800 mg bid x 5 days given his low GFR that was done 4 months ago and interactions with several of his medications including Eliquis, Plavix, diltiazem and rosuvastatin Discussed possible side effects Continue Robitussin-DM and Tylenol as needed Advised him to call with any questions or his symptoms worsen-May need to go to the emergency room for further evaluation if his symptoms worsen  His blood pressure is on the low side at home which is not normal for him-this could be COVID-related, being sick and multifactorial-not eating and drinking as much Advised to hold metoprolol this evening.  Monitor BP closely and call with concerns/questions to the weekend hold medication if needed

## 2021-06-10 ENCOUNTER — Encounter: Payer: Self-pay | Admitting: Cardiovascular Disease

## 2021-06-10 NOTE — Progress Notes (Signed)
This encounter was created in error - please disregard.

## 2021-06-12 ENCOUNTER — Encounter: Payer: Medicare HMO | Admitting: Cardiovascular Disease

## 2021-07-14 ENCOUNTER — Other Ambulatory Visit: Payer: Self-pay | Admitting: Cardiovascular Disease

## 2021-07-23 ENCOUNTER — Telehealth: Payer: Self-pay | Admitting: Cardiovascular Disease

## 2021-07-23 DIAGNOSIS — I2510 Atherosclerotic heart disease of native coronary artery without angina pectoris: Secondary | ICD-10-CM

## 2021-07-23 DIAGNOSIS — E785 Hyperlipidemia, unspecified: Secondary | ICD-10-CM

## 2021-07-23 NOTE — Telephone Encounter (Signed)
Called pt and adv that per Dr. Acie Fredrickson last ov note he is to get fasting lipids/liver, bmet, cbc prior to next appointment.  Orders placed and appointment scheduled.  Pt appreciative for the call/assistance.

## 2021-07-23 NOTE — Telephone Encounter (Signed)
New Message:    Patient is scheduled to see Dr Acie Fredrickson on 10-17-21.  He says he usually have lab work prior to his visit. He had his last lab work in April 2022. Does he still need lab work?

## 2021-07-23 NOTE — Telephone Encounter (Signed)
Tried to call patient. Phone rang busy.

## 2021-07-25 NOTE — Progress Notes (Signed)
Subjective:    Patient ID: [name], male    DOB: 09/12/1946, 75 y.o.   MRN: [id number]  This visit occurred during the SARS-CoV-2 public health emergency.  Safety protocols were in place, including screening questions prior to the visit, additional usage of staff PPE, and extensive cleaning of exam room while observing appropriate contact time as indicated for disinfecting solutions.    HPI The patient is here for an acute visit.  He is here with his wife.  She helps provide some history.   Right hand tremor for a while - years but it is getting worse.  Left hand just started tremor.  He had an aunt with parkinson's.  No tremor elsewhere.  Tremor is worse with writing and eating.  No resting tremor.  The tremor is worse when he gets upset or when he is tired.      His balance is poor - he staggers when he first stands.  This started several months ago.     Some memory issues.  He had COVID not long ago and had a temperature of 102.  At 1 point his wife called an ambulance and he does not recall any of that.  He did not know about that until a neighbor told him-he was concerned that he did not remember that.  His wife states that he also forgets day-to-day things.  He asked the same question over and over.  He had an episode of confusion just over a year ago and did see a neurologist at that time and work-up was negative for stroke.  She states he does get confused at times.   Saw Dr Krista Blue 8/21 for blurry vision, confusion.   W/u at that time was neg for cva.     Medications and allergies reviewed with patient and updated if appropriate.  Patient Active Problem List   Diagnosis Date Noted   Bilateral hearing loss 07/26/2021   COVID 06/05/2021   LLQ pain 08/10/2020   Vision changes 05/17/2020   Confusion 05/17/2020   Right flank pain 01/27/2020   Erysipelas 09/12/2019   Progressive angina ([place]) 08/04/2019   Gouty arthritis of left great toe 08/02/2019   Persistent atrial  fibrillation ([place]), rapid ventricular response 39/12/90   Umbilical hernia without obstruction or gangrene 09/14/2018   B12 deficiency 09/14/2018   Erectile dysfunction 08/04/2016   Elevated TSH 08/29/2015   S/P coronary artery stent placement, 01/19/15 S/p DES of mid RCA 01/20/2015   Unstable angina (HCC)    Prediabetes 10/03/2014   TIA (transient ischemic attack) 07/31/2014   NSTEMI (non-ST elevated myocardial infarction) ([place]) 06/04/2014   Chronic kidney disease (CKD), stage III (moderate) ([place]) 06/14/2010   Coronary artery disease involving native coronary artery of native heart without angina pectoris 03/01/2010   Benign essential tremor 04/24/2009   PROSTATE CANCER, HX OF 04/24/2009   Melanoma ([place]) 04/24/2009   Hyperlipidemia LDL goal <70 10/28/2007   Essential hypertension 10/13/2007    Current Outpatient Medications on File Prior to Visit  Medication Sig Dispense Refill   allopurinol (ZYLOPRIM) 100 MG tablet Take 1 tablet (100 mg total) by mouth daily. 90 tablet 3   apixaban (ELIQUIS) 5 MG TABS tablet TAKE 1 TABLET BY MOUTH TWICE A DAY 180 tablet 1   calcium carbonate (TUMS - DOSED IN MG ELEMENTAL CALCIUM) 500 MG chewable tablet Chew 2 tablets by mouth daily as needed for indigestion or heartburn.      clopidogrel (PLAVIX) 75 MG tablet TAKE  1 TABLET (75 MG TOTAL) BY MOUTH DAILY WITH BREAKFAST. 90 tablet 3   Cyanocobalamin (VITAMIN B12 PO) Take 1 tablet by mouth every evening.     diltiazem (CARDIZEM CD) 240 MG 24 hr capsule TAKE 1 CAPSULE BY MOUTH EVERY DAY 90 capsule 2   ezetimibe (ZETIA) 10 MG tablet TAKE 1 TABLET BY MOUTH EVERY DAY 90 tablet 2   fish oil-omega-3 fatty acids 1000 MG capsule Take 2 g by mouth 2 (two) times daily.       metoprolol tartrate (LOPRESSOR) 50 MG tablet TAKE 1 TABLET BY MOUTH TWICE A DAY 180 tablet 3   Misc Natural Products (TART CHERRY ADVANCED PO) Take 1,000 mg by mouth daily.     nitroGLYCERIN (NITROSTAT) 0.4 MG SL tablet Place 1 tablet (0.4 mg  total) under the tongue every 5 (five) minutes x 3 doses as needed for chest pain. 25 tablet 4   Potassium Gluconate 2 MEQ TABS Take 1 tablet by mouth daily.     rosuvastatin (CRESTOR) 20 MG tablet TAKE 1 TABLET BY MOUTH EVERY DAY 90 tablet 3   No current facility-administered medications on file prior to visit.    Past Medical History:  Diagnosis Date   Anemia, mild    CAD (coronary artery disease)    a. 02/2010 NSTEMI/PCI: PROMUS DES to mRCA. b. 06/05/14 Cath/PCI: pLAD 70%--> s/p PCI/DES (Promus DES);  c. 01/2015 Cath/PCI: LM nl, LAD 1m, patent prox stent, LCX patent prox stent, RCA 30p, 4m (3.5x20 Synergy DES). // Myoview 07/2019:  EF 16, atten artifact, no ischemia, intermediate risk (low EF)    CKD (chronic kidney disease), stage III (HCC)    Diastolic dysfunction    a. Echo 6/11: mild LVH, EF 55-60%, GR1DD, Trivial MR, mild LAE. b. echo 06/04/14: EF 55-60%, no WMA, GR1DD, Ao valve mildly calcifed, mild MR, mild LAE   History of pulmonary embolism    Hyperlipidemia    Hypertension    did not tolerate Lisinopril event at low dose   Melanoma ([place])    a. 1987. b. 2012   Myocardial infarction ([place])    Persistent atrial fibrillation ([place]) 12/03/2018   Dx 11/2018 >> Apixaban started // Echo 12/2018: EF 55-60, mod RVE, normal RVSF, mild LAE, trivial MR, mild TR, mod AoV sclerosis    Prostate cancer ([place])    Dr Karsten Ro   Stroke [facility])    TIA (transient ischemic attack)    a. post cath and PCI on 06/05/2014 - no residual sequelae. // ?recurrent TIA 8/21 >> Carotid US 8/21: normal  // Echocardiogram 8/21: EF 55-60, no RWMA, normal RVSF, mild BAE, trivial MR, trivial AI    Vitamin B12 deficiency     Past Surgical History:  Procedure Laterality Date   CHOLECYSTECTOMY     COLONOSCOPY  2006   CORONARY STENT INTERVENTION N/A 08/05/2019   Procedure: CORONARY STENT INTERVENTION;  Surgeon: Jettie Booze, MD;  Location: [facility];  Service: Cardiovascular;  Laterality: N/A;   KNEE  ARTHROSCOPY     right   LEFT HEART CATH AND CORONARY ANGIOGRAPHY N/A 08/05/2019   Procedure: LEFT HEART CATH AND CORONARY ANGIOGRAPHY;  Surgeon: Jettie Booze, MD;  Location: [facility];  Service: Cardiovascular;  Laterality: N/A;   LEFT HEART CATHETERIZATION WITH CORONARY ANGIOGRAM N/A 06/05/2014   Procedure: LEFT HEART CATHETERIZATION WITH CORONARY ANGIOGRAM;  Surgeon: Blane Ohara, MD;  Location[facility] C[facility];  Service: Cardiovascular;  Laterality: N/A;   LEFT HEART CATHETERIZATION  WITH CORONARY ANGIOGRAM N/A 01/19/2015   Procedure: LEFT HEART CATHETERIZATION WITH CORONARY ANGIOGRAM;  Surgeon: Peter M Martinique, MD;  Location: [facility] CATH LAB;  Service: Cardiovascular;  Laterality: N/A;   MELANOMA EXCISION  2011    L chest   MELANOMA EXCISION  1991   back   mRCA stent  02/2010   PROSTATECTOMY     Dr. Karsten Ro   TONSILLECTOMY      Social History   Socioeconomic History   Marital status: Married    Spouse name: Not on file   Number of children: Not on file   Years of education: Not on file   Highest education level: Not on file  Occupational History   Not on file  Tobacco Use   Smoking status: Never   Smokeless tobacco: Never  Vaping Use   Vaping Use: Never used  Substance and Sexual Activity   Alcohol use: No    Alcohol/week: 0.0 standard drinks   Drug use: No   Sexual activity: Not on file  Other Topics Concern   Not on file  Social History Narrative   Right handed    Caffeine- 2-3 cups per day    Social Determinants of Health   Financial Resource Strain: Not on file  Food Insecurity: Not on file  Transportation Needs: Not on file  Physical Activity: Not on file  Stress: Not on file  Social Connections: Not on file    Family History  Problem Relation Age of Onset   Coronary artery disease Mother        S/P CABG, in her 32s   Transient ischemic attack Mother 72   Heart disease Mother    Prostate cancer Father    Hypertension Brother    Diabetes  Sister     Review of Systems  Constitutional:  Negative for fever.  HENT:  Positive for hearing loss.   Respiratory:  Positive for cough and shortness of breath (chronic - increased). Negative for wheezing.   Cardiovascular:  Negative for chest pain, palpitations and leg swelling.  Neurological:  Positive for tremors (R hand > L hand). Negative for dizziness, weakness, light-headedness and headaches.       Poor balance  Psychiatric/Behavioral:  Positive for dysphoric mood. The patient is nervous/anxious.       Objective:   Vitals:   07/26/21 1427  BP: 126/80  Pulse: 82  Temp: 98.4 F (36.9 C)  SpO2: 97%   BP Readings from Last 3 Encounters:  07/26/21 126/80  08/10/20 124/72  06/12/20 116/72   Wt Readings from Last 3 Encounters:  07/26/21 210 lb (95.3 kg)  08/10/20 218 lb (98.9 kg)  06/12/20 216 lb (98 kg)   Body mass index is 25.56 kg/m.   Depression screen [facility] 2/9 07/26/2021 01/27/2020 09/04/2017 11/05/2015 10/03/2014  Decreased Interest 2 0 0 0 0  Down, Depressed, Hopeless 1 0 0 0 0  PHQ - 2 Score 3 0 0 0 0  Altered sleeping 0 - - - -  Tired, decreased energy 2 - - - -  Change in appetite 0 - - - -  Feeling bad or failure about yourself  0 - - - -  Trouble concentrating 0 - - - -  Moving slowly or fidgety/restless 2 - - - -  Suicidal thoughts 0 - - - -  PHQ-9 Score 7 - - - -  Difficult doing work/chores Somewhat difficult - - - -      GAD 7 : Generalized Anxiety  Score 07/26/2021  Nervous, Anxious, on Edge 1  Control/stop worrying 2  Worry too much - different things 1  Trouble relaxing 0  Restless 1  Easily annoyed or irritable 2  Afraid - awful might happen 1  Total GAD 7 Score 8  Anxiety Difficulty Somewhat difficult      Physical Exam Constitutional:      General: He is not in acute distress.    Appearance: Normal appearance. He is not ill-appearing.  HENT:     Head: Normocephalic and atraumatic.     Right Ear: Tympanic membrane, ear canal and  external ear normal. There is no impacted cerumen.     Left Ear: Tympanic membrane, ear canal and external ear normal. There is no impacted cerumen.     Ears:     Comments: Very hard of hearing Eyes:     Extraocular Movements: Extraocular movements intact.     Conjunctiva/sclera: Conjunctivae normal.  Neck:     Vascular: No carotid bruit.  Cardiovascular:     Rate and Rhythm: Normal rate. Rhythm irregular.     Heart sounds: No murmur heard. Pulmonary:     Effort: Pulmonary effort is normal. No respiratory distress.     Breath sounds: No wheezing or rales.  Musculoskeletal:     Cervical back: Neck supple. No tenderness.     Right lower leg: No edema.     Left lower leg: No edema.  Lymphadenopathy:     Cervical: No cervical adenopathy.  Skin:    General: Skin is warm and dry.  Neurological:     General: No focal deficit present.     Mental Status: He is alert.     Sensory: No sensory deficit.     Motor: No weakness.     Comments: Poor balance-positive Romberg  Psychiatric:        Mood and Affect: Mood normal.           Assessment & Plan:    Screened for depression using the PHQ 9 scale indicates depression.  Him and his wife feel most of that is related to not knowing what is going on with him and concerns regarding that.  Indicated that this could affect his memory and some of the symptoms he is concerned about.  He deferred treatment at this time.  We will need to monitor this.   Screened for anxiety using GAD7 Scale indicates anxiety, which him and his wife feel could be related to worrying about what is going on with him and his current medical symptoms.  His wife thinks he could benefit from medication, but he defers at this time.  We can monitor this after further evaluation of his current symptoms.    See Problem List for Assessment and Plan of chronic medical problems.      I spent 30 minutes dedicated to the care of this patient on the date of this encounter  including review of recent labs, imaging and procedures, speciality notes, obtaining history, communicating with the patient, ordering medications, tests, and documenting clinical information in the EHR

## 2021-07-26 ENCOUNTER — Other Ambulatory Visit: Payer: Self-pay

## 2021-07-26 ENCOUNTER — Encounter: Payer: Self-pay | Admitting: Internal Medicine

## 2021-07-26 ENCOUNTER — Ambulatory Visit (INDEPENDENT_AMBULATORY_CARE_PROVIDER_SITE_OTHER): Payer: Medicare HMO | Admitting: Internal Medicine

## 2021-07-26 VITALS — BP 126/80 | HR 82 | Temp 98.4°F | Ht 76.0 in | Wt 210.0 lb

## 2021-07-26 DIAGNOSIS — E538 Deficiency of other specified B group vitamins: Secondary | ICD-10-CM

## 2021-07-26 DIAGNOSIS — R7989 Other specified abnormal findings of blood chemistry: Secondary | ICD-10-CM

## 2021-07-26 DIAGNOSIS — G250 Essential tremor: Secondary | ICD-10-CM | POA: Diagnosis not present

## 2021-07-26 DIAGNOSIS — R7303 Prediabetes: Secondary | ICD-10-CM

## 2021-07-26 DIAGNOSIS — M109 Gout, unspecified: Secondary | ICD-10-CM

## 2021-07-26 DIAGNOSIS — Z1331 Encounter for screening for depression: Secondary | ICD-10-CM

## 2021-07-26 DIAGNOSIS — H9193 Unspecified hearing loss, bilateral: Secondary | ICD-10-CM

## 2021-07-26 DIAGNOSIS — R410 Disorientation, unspecified: Secondary | ICD-10-CM

## 2021-07-26 LAB — CBC WITH DIFFERENTIAL/PLATELET
Basophils Absolute: 0 10*3/uL (ref 0.0–0.1)
Basophils Relative: 0.4 % (ref 0.0–3.0)
Eosinophils Absolute: 0.1 10*3/uL (ref 0.0–0.7)
Eosinophils Relative: 2.4 % (ref 0.0–5.0)
HCT: 39.7 % (ref 39.0–52.0)
Hemoglobin: 13.4 g/dL (ref 13.0–17.0)
Lymphocytes Relative: 28.4 % (ref 12.0–46.0)
Lymphs Abs: 1.7 10*3/uL (ref 0.7–4.0)
MCHC: 33.8 g/dL (ref 30.0–36.0)
MCV: 83.9 fl (ref 78.0–100.0)
Monocytes Absolute: 0.6 10*3/uL (ref 0.1–1.0)
Monocytes Relative: 9.6 % (ref 3.0–12.0)
Neutro Abs: 3.6 10*3/uL (ref 1.4–7.7)
Neutrophils Relative %: 59.2 % (ref 43.0–77.0)
Platelets: 134 10*3/uL — ABNORMAL LOW (ref 150.0–400.0)
RBC: 4.73 Mil/uL (ref 4.22–5.81)
RDW: 15.1 % (ref 11.5–15.5)
WBC: 6.1 10*3/uL (ref 4.0–10.5)

## 2021-07-26 LAB — COMPREHENSIVE METABOLIC PANEL
ALT: 27 U/L (ref 0–53)
AST: 26 U/L (ref 0–37)
Albumin: 4.3 g/dL (ref 3.5–5.2)
Alkaline Phosphatase: 54 U/L (ref 39–117)
BUN: 17 mg/dL (ref 6–23)
CO2: 30 mEq/L (ref 19–32)
Calcium: 9.7 mg/dL (ref 8.4–10.5)
Chloride: 104 mEq/L (ref 96–112)
Creatinine, Ser: 1.63 mg/dL — ABNORMAL HIGH (ref 0.40–1.50)
GFR: 40.47 mL/min — ABNORMAL LOW (ref >60.00)
Glucose, Bld: 73 mg/dL (ref 70–99)
Potassium: 4.5 mEq/L (ref 3.5–5.1)
Sodium: 138 mEq/L (ref 135–145)
Total Bilirubin: 0.6 mg/dL (ref 0.2–1.2)
Total Protein: 6.9 g/dL (ref 6.0–8.3)

## 2021-07-26 LAB — VITAMIN B12: Vitamin B-12: 481 pg/mL (ref 211–911)

## 2021-07-26 LAB — TSH: TSH: 5.02 u[IU]/mL (ref 0.35–5.50)

## 2021-07-26 LAB — URIC ACID: Uric Acid, Serum: 5 mg/dL (ref 4.0–7.8)

## 2021-07-26 NOTE — Assessment & Plan Note (Signed)
Chronic He is very hard of hearing Discussed with him that he needs to get hearing aids, but he does not want them Explained that this can affect his memory negatively and that he needs to get them They requested referral to ENT for further evaluation-referral ordered

## 2021-07-26 NOTE — Assessment & Plan Note (Signed)
Chronic Taking B12 vitamins daily Check B12 level

## 2021-07-26 NOTE — Patient Instructions (Addendum)
  Blood work was ordered.      Medications changes include :   none    A referral was ordered for Dr Geronimo [facility].        Someone from their office will call you to schedule an appointment.

## 2021-07-26 NOTE — Assessment & Plan Note (Signed)
History of elevated TSH Recheck TSH

## 2021-07-26 NOTE — Assessment & Plan Note (Signed)
Chronic Has had right hand tremor for a long time-this has gotten worse and recently has had left hand tremor His aunt had Parkinson's His tremor pattern is worse with writing, eating and when he is upset or fatigue Discussed this is likely benign essential tremor Will refer to neurology for this and further evaluation of his other neurological concerns

## 2021-07-26 NOTE — Assessment & Plan Note (Signed)
Acute Confusion/memory issues He is having some memory issues or confusion Discussed that there could be many parts to this-his hearing is likely contributing and he will have that evaluated by ENT and consider hearing aids We will check blood work to rule out other causes-B12, TSH, CBC, CMP He has been taking his blood thinners religiously so we will hold off on an MRI since he just had one last year and that was normal Will refer to a neurologist and neuropsychologist for further evaluation Encouraged healthy lifestyle-stressed regular exercise, good sleep and healthy eating His depression/anxiety screening indicated some possible depression or anxiety, but he deferred medication-he and his wife think this is more concerned over what is going on with him, but we need to monitor this

## 2021-07-26 NOTE — Assessment & Plan Note (Signed)
Chronic Denies any gout symptoms actively Check uric acid level Continue allopurinol 100 mg daily

## 2021-07-26 NOTE — Assessment & Plan Note (Signed)
CMP

## 2021-08-24 ENCOUNTER — Other Ambulatory Visit: Payer: Self-pay | Admitting: Internal Medicine

## 2021-09-11 ENCOUNTER — Ambulatory Visit (INDEPENDENT_AMBULATORY_CARE_PROVIDER_SITE_OTHER): Payer: Medicare HMO | Admitting: Nurse Practitioner

## 2021-09-11 ENCOUNTER — Other Ambulatory Visit: Payer: Self-pay

## 2021-09-11 ENCOUNTER — Encounter: Payer: Self-pay | Admitting: Nurse Practitioner

## 2021-09-11 VITALS — BP 132/74 | HR 91 | Temp 96.7°F | Resp 14 | Ht 76.0 in | Wt 212.1 lb

## 2021-09-11 DIAGNOSIS — J40 Bronchitis, not specified as acute or chronic: Secondary | ICD-10-CM | POA: Diagnosis not present

## 2021-09-11 DIAGNOSIS — R051 Acute cough: Secondary | ICD-10-CM

## 2021-09-11 MED ORDER — DOXYCYCLINE HYCLATE 100 MG PO TABS: 100.0000 mg | ORAL_TABLET | Freq: Two times a day (BID) | ORAL | 0 refills | Status: AC

## 2021-09-11 MED ORDER — BENZONATATE 100 MG PO CAPS: 100.0000 mg | ORAL_CAPSULE | Freq: Three times a day (TID) | ORAL | 0 refills | Status: AC

## 2021-09-11 NOTE — Assessment & Plan Note (Signed)
Patient having acute cough.  Is been taking over-the-counter medications without great relief.  Has used [employer] in the past with benefit.  We will send in some Tessalon Perles 100 mg 3 times daily as needed cough

## 2021-09-11 NOTE — Progress Notes (Signed)
Acute Office Visit  Subjective:    Patient ID: [name], male    DOB: 16-Jul-1946, 75 y.o.   MRN: [id number]  Chief Complaint  Patient presents with   Nasal Congestion    Sx since 09/02/21. Runny nose, post nasal drip, sinus congestion a little, cough, rattling, coughing up greenish/yellow phlegm, woke up one night in sweat and feels like he had a fever maybe. Has taking Mucinnex Dm, cough drops, Benadryl off and on. Covid test negative on 09/09/21.    HPI Patient is in today for  Symptoms started on 09/02/2021 Covid test was negative on 09/09/2021 Pfizer vaccines x 2 and Covid last of august. No sick contacts Mucinnex Dm with some relief Feels like a slight improvement between yesterday and today   Past Medical History:  Diagnosis Date   Anemia, mild    CAD (coronary artery disease)    a. 02/2010 NSTEMI/PCI: PROMUS DES to mRCA. b. 06/05/14 Cath/PCI: pLAD 70%--> s/p PCI/DES (Promus DES);  c. 01/2015 Cath/PCI: LM nl, LAD 28m, patent prox stent, LCX patent prox stent, RCA 30p, 54m (3.5x20 Synergy DES). // Myoview 07/2019:  EF 48, atten artifact, no ischemia, intermediate risk (low EF)    CKD (chronic kidney disease), stage III (HCC)    Diastolic dysfunction    a. Echo 6/11: mild LVH, EF 55-60%, GR1DD, Trivial MR, mild LAE. b. echo 06/04/14: EF 55-60%, no WMA, GR1DD, Ao valve mildly calcifed, mild MR, mild LAE   History of pulmonary embolism    Hyperlipidemia    Hypertension    did not tolerate Lisinopril event at low dose   Melanoma (HCC)    a. 1987. b. 2012   Myocardial infarction (HCC)    Persistent atrial fibrillation (HCC) 12/03/2018   Dx 11/2018 >> Apixaban started // Echo 12/2018: EF 55-60, mod RVE, normal RVSF, mild LAE, trivial MR, mild TR, mod AoV sclerosis    Prostate cancer (HCC)    Dr Vernie Ammons   Stroke [facility])    TIA (transient ischemic attack)    a. post cath and PCI on 06/05/2014 - no residual sequelae. // ?recurrent TIA 8/21 >> Carotid US 8/21: normal  //  Echocardiogram 8/21: EF 55-60, no RWMA, normal RVSF, mild BAE, trivial MR, trivial AI    Vitamin B12 deficiency     Past Surgical History:  Procedure Laterality Date   CHOLECYSTECTOMY     COLONOSCOPY  2006   CORONARY STENT INTERVENTION N/A 08/05/2019   Procedure: CORONARY STENT INTERVENTION;  Surgeon: Corky Crafts, MD;  Location: [facility] INVASIVE CV LAB;  Service: Cardiovascular;  Laterality: N/A;   KNEE ARTHROSCOPY     right   LEFT HEART CATH AND CORONARY ANGIOGRAPHY N/A 08/05/2019   Procedure: LEFT HEART CATH AND CORONARY ANGIOGRAPHY;  Surgeon: Corky Crafts, MD;  Location[facility] INVASIVE CV LAB;  Service: Cardiovascular;  Laterality: N/A;   LEFT HEART CATHETERIZATION WITH CORONARY ANGIOGRAM N/A 06/05/2014   Procedure: LEFT HEART CATHETERIZATION WITH CORONARY ANGIOGRAM;  Surgeon: Micheline Chapman, MD;  Location: [facility] CATH LAB;  Service: Cardiovascular;  Laterality: N/A;   LEFT HEART CATHETERIZATION WITH CORONARY ANGIOGRAM N/A 01/19/2015   Procedure: LEFT HEART CATHETERIZATION WITH CORONARY ANGIOGRAM;  Surgeon: Peter M Swaziland, MD;  Location: [facility] CATH LAB;  Service: Cardiovascular;  Laterality: N/A;   MELANOMA EXCISION  2011    L chest   MELANOMA EXCISION  1991   back   mRCA stent  02/2010   PROSTATECTOMY     Dr. Vernie Ammons  TONSILLECTOMY      Family History  Problem Relation Age of Onset   Coronary artery disease Mother        S/P CABG, in her 102s   Transient ischemic attack Mother 47   Heart disease Mother    Prostate cancer Father    Hypertension Brother    Diabetes Sister     Social History   Socioeconomic History   Marital status: Married    Spouse name: Not on file   Number of children: Not on file   Years of education: Not on file   Highest education level: Not on file  Occupational History   Not on file  Tobacco Use   Smoking status: Never   Smokeless tobacco: Never  Vaping Use   Vaping Use: Never used  Substance and Sexual Activity   Alcohol use: No     Alcohol/week: 0.0 standard drinks   Drug use: No   Sexual activity: Not on file  Other Topics Concern   Not on file  Social History Narrative   Right handed    Caffeine- 2-3 cups per day    Social Determinants of Health   Financial Resource Strain: Not on file  Food Insecurity: Not on file  Transportation Needs: Not on file  Physical Activity: Not on file  Stress: Not on file  Social Connections: Not on file  Intimate Partner Violence: Not on file    Outpatient Medications Prior to Visit  Medication Sig Dispense Refill   allopurinol (ZYLOPRIM) 100 MG tablet TAKE 1 TABLET BY MOUTH EVERY DAY 90 tablet 3   apixaban (ELIQUIS) 5 MG TABS tablet TAKE 1 TABLET BY MOUTH TWICE A DAY 180 tablet 1   calcium carbonate (TUMS - DOSED IN MG ELEMENTAL CALCIUM) 500 MG chewable tablet Chew 2 tablets by mouth daily as needed for indigestion or heartburn.      clopidogrel (PLAVIX) 75 MG tablet TAKE 1 TABLET (75 MG TOTAL) BY MOUTH DAILY WITH BREAKFAST. 90 tablet 3   Cyanocobalamin (VITAMIN B12 PO) Take 1 tablet by mouth every evening.     diltiazem (CARDIZEM CD) 240 MG 24 hr capsule TAKE 1 CAPSULE BY MOUTH EVERY DAY 90 capsule 2   ezetimibe (ZETIA) 10 MG tablet TAKE 1 TABLET BY MOUTH EVERY DAY 90 tablet 2   fish oil-omega-3 fatty acids 1000 MG capsule Take 2 g by mouth 2 (two) times daily.       metoprolol tartrate (LOPRESSOR) 50 MG tablet TAKE 1 TABLET BY MOUTH TWICE A DAY 180 tablet 3   Misc Natural Products (TART CHERRY ADVANCED PO) Take 1,000 mg by mouth daily.     Potassium Gluconate 2 MEQ TABS Take 1 tablet by mouth daily.     rosuvastatin (CRESTOR) 20 MG tablet TAKE 1 TABLET BY MOUTH EVERY DAY 90 tablet 3   nitroGLYCERIN (NITROSTAT) 0.4 MG SL tablet Place 1 tablet (0.4 mg total) under the tongue every 5 (five) minutes x 3 doses as needed for chest pain. (Patient not taking: Reported on 09/11/2021) 25 tablet 4   No facility-administered medications prior to visit.    Allergies  Allergen  Reactions   Nitroglycerin Other (See Comments)    Pt will not take.  States he was in hospital and after taking second NTG SL pt states "I had to be shocked, I am scared of that medicine and will not take again".    Lidocaine Other (See Comments)    Syncope post palmer injection; probably vasovagal as no  reaction to intraarticular Lidocaine into knee    Review of Systems  Constitutional:  Positive for fever. Negative for chills and fatigue.  HENT:  Positive for congestion. Negative for ear discharge, ear pain, sinus pressure, sinus pain and sore throat.   Respiratory:  Positive for cough (yellow and green) and shortness of breath (baseline).   Cardiovascular:  Negative for chest pain.  Gastrointestinal:  Negative for abdominal pain, diarrhea, nausea and vomiting.  Musculoskeletal:  Negative for arthralgias and myalgias.  Neurological:  Negative for headaches.      Objective:    Physical Exam Vitals and nursing note reviewed.  Constitutional:      Appearance: Normal appearance.  HENT:     Right Ear: Tympanic membrane, ear canal and external ear normal. There is no impacted cerumen.     Left Ear: Tympanic membrane, ear canal and external ear normal. There is no impacted cerumen.     Mouth/Throat:     Mouth: Mucous membranes are moist.     Pharynx: Oropharynx is clear.  Eyes:     Comments: Wears corrective lenses  Cardiovascular:     Rate and Rhythm: Normal rate and regular rhythm.  Pulmonary:     Effort: Pulmonary effort is normal.     Breath sounds: Normal breath sounds.  Abdominal:     General: Bowel sounds are normal.  Lymphadenopathy:     Cervical: No cervical adenopathy.  Neurological:     Mental Status: He is alert.  Psychiatric:        Mood and Affect: Mood normal.        Behavior: Behavior normal.        Thought Content: Thought content normal.        Judgment: Judgment normal.    BP 132/74   Pulse 91   Temp (!) 96.7 F (35.9 C)   Resp 14   Ht 6\' 4"  (1.93  m)   Wt 212 lb 2 oz (96.2 kg)   SpO2 98%   BMI 25.82 kg/m  Wt Readings from Last 3 Encounters:  09/11/21 212 lb 2 oz (96.2 kg)  07/26/21 210 lb (95.3 kg)  08/10/20 218 lb (98.9 kg)    Health Maintenance Due  Topic Date Due   Pneumonia Vaccine 7+ Years old (1 - PCV) Never done   Zoster Vaccines- Shingrix (1 of 2) Never done   COVID-19 Vaccine (3 - Pfizer risk series) 01/31/2020   TETANUS/TDAP  06/14/2020    There are no preventive care reminders to display for this patient.   Lab Results  Component Value Date   TSH 5.02 07/26/2021   Lab Results  Component Value Date   WBC 6.1 07/26/2021   HGB 13.4 07/26/2021   HCT 39.7 07/26/2021   MCV 83.9 07/26/2021   PLT 134.0 (L) 07/26/2021   Lab Results  Component Value Date   NA 138 07/26/2021   K 4.5 07/26/2021   CO2 30 07/26/2021   GLUCOSE 73 07/26/2021   BUN 17 07/26/2021   CREATININE 1.63 (H) 07/26/2021   BILITOT 0.6 07/26/2021   ALKPHOS 54 07/26/2021   AST 26 07/26/2021   ALT 27 07/26/2021   PROT 6.9 07/26/2021   ALBUMIN 4.3 07/26/2021   CALCIUM 9.7 07/26/2021   ANIONGAP 10 08/06/2019   EGFR 34 (L) 01/29/2021   GFR 40.47 (L) 07/26/2021   Lab Results  Component Value Date   CHOL 106 05/11/2020   Lab Results  Component Value Date   HDL 42 05/11/2020  Lab Results  Component Value Date   LDLCALC 46 05/11/2020   Lab Results  Component Value Date   TRIG 96 05/11/2020   Lab Results  Component Value Date   CHOLHDL 2.5 05/11/2020   Lab Results  Component Value Date   HGBA1C 5.5 08/04/2019       Assessment & Plan:   Problem List Items Addressed This Visit       Respiratory   Bronchitis - Primary    Given length of symptoms and patient's age comorbidities will elect to treat with doxycycline 100 mg twice daily for 7 days.  Discussed with patient and spouse in office verifies agreement with plan.  Did discuss return to clinic precautions.   Doxycycline 100 mg twice daily      Relevant  Medications   doxycycline (VIBRA-TABS) 100 MG tablet     Other   Acute cough    Patient having acute cough.  Is been taking over-the-counter medications without great relief.  Has used [employer] in the past with benefit.  We will send in some Tessalon Perles 100 mg 3 times daily as needed cough      Relevant Medications   benzonatate (TESSALON) 100 MG capsule     No orders of the defined types were placed in this encounter.  This visit occurred during the SARS-CoV-2 public health emergency.  Safety protocols were in place, including screening questions prior to the visit, additional usage of staff PPE, and extensive cleaning of exam room while observing appropriate contact time as indicated for disinfecting solutions.   Romilda Garret, NP

## 2021-09-11 NOTE — Patient Instructions (Signed)
Nice to see you today Sent in antibiotics and cough medication Follow up if not improving or if you get worse

## 2021-09-11 NOTE — Assessment & Plan Note (Signed)
Given length of symptoms and patient's age comorbidities will elect to treat with doxycycline 100 mg twice daily for 7 days.  Discussed with patient and spouse in office verifies agreement with plan.  Did discuss return to clinic precautions.   Doxycycline 100 mg twice daily

## 2021-09-15 ENCOUNTER — Other Ambulatory Visit: Payer: Self-pay | Admitting: Cardiovascular Disease

## 2021-09-20 IMAGING — DX DG CHEST 2V
2 series · 2 of 2 positions shown · non-contrast
Comparison: January 17, 2015

CLINICAL DATA: Chest pain

EXAM:
CHEST - 2 VIEW

[chest pa]
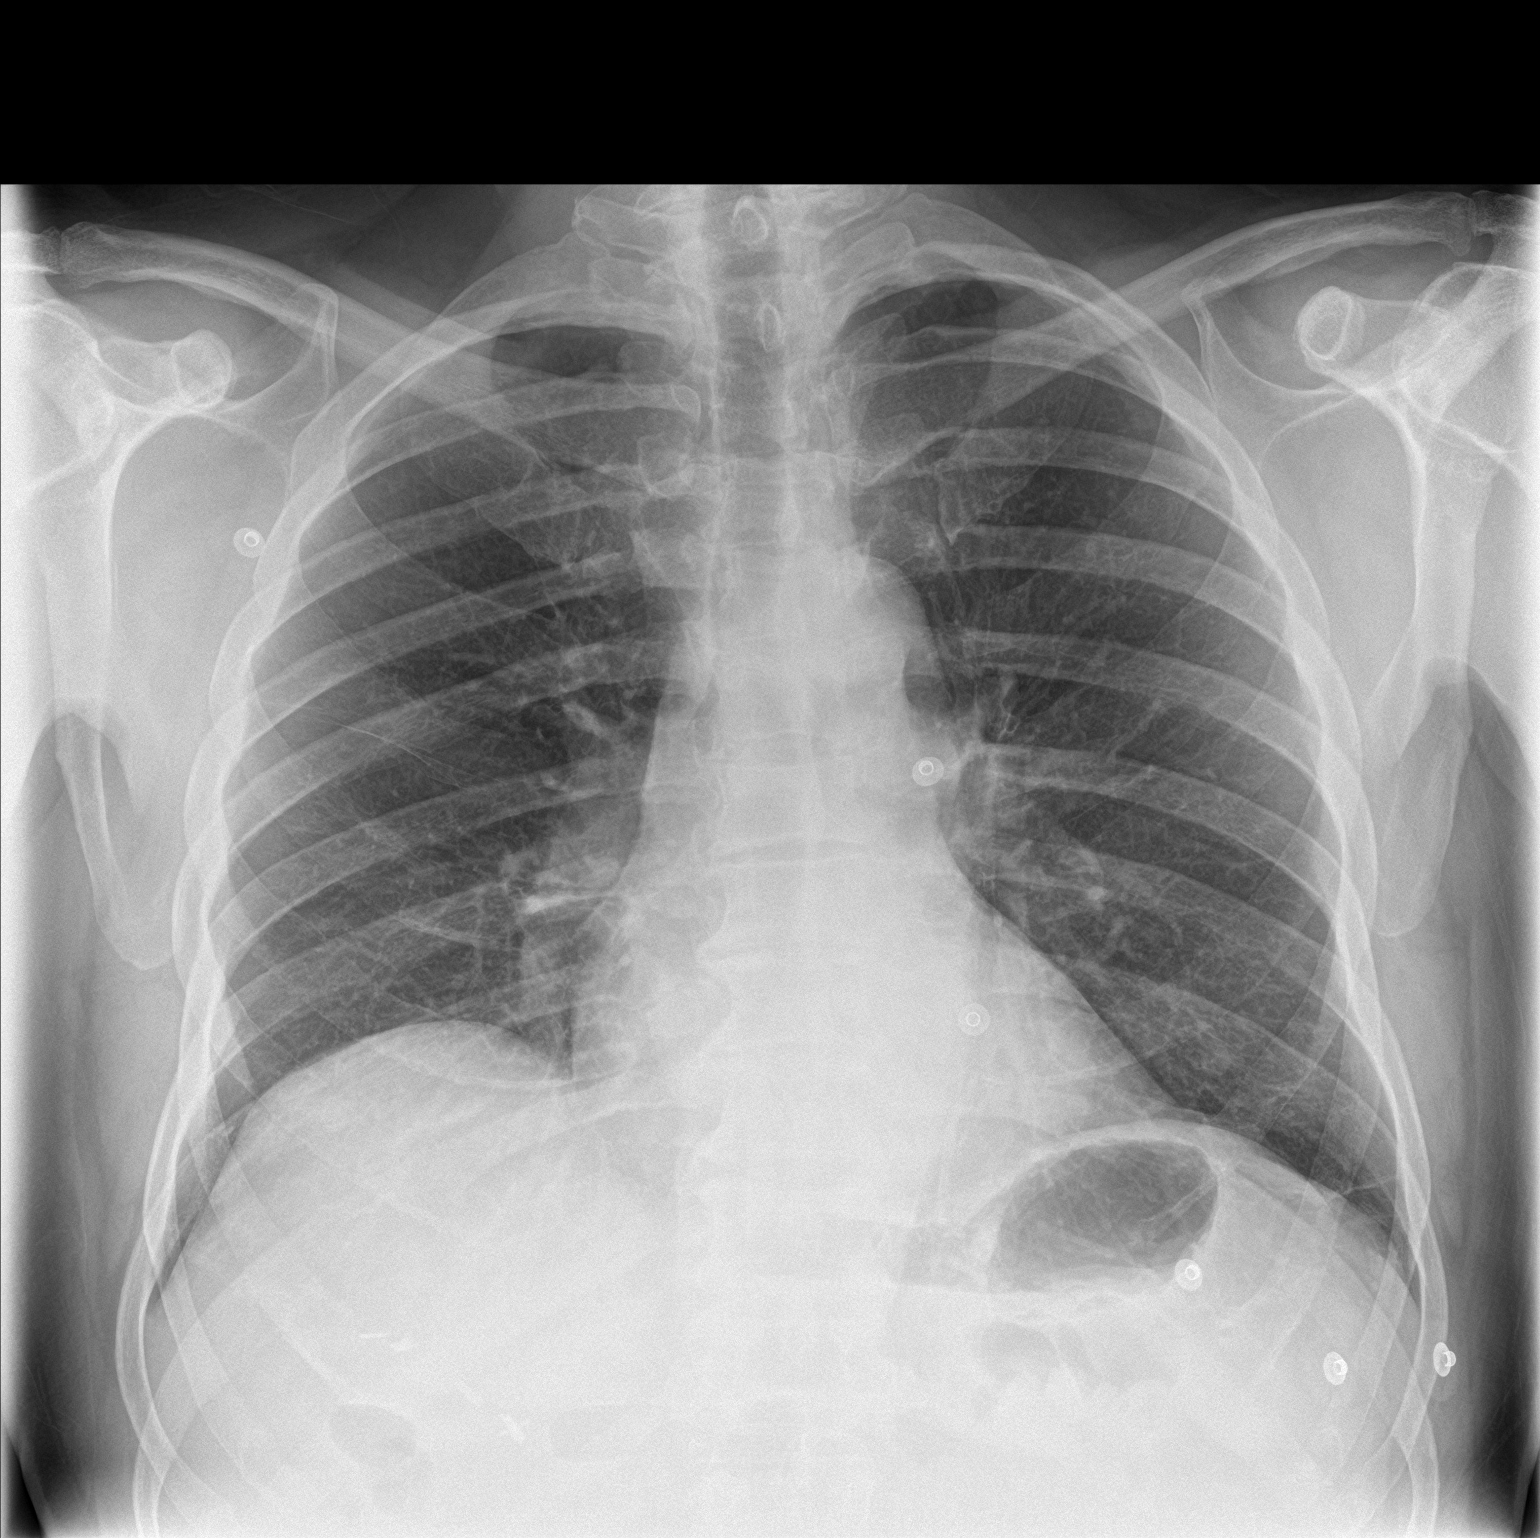

[chest lat]
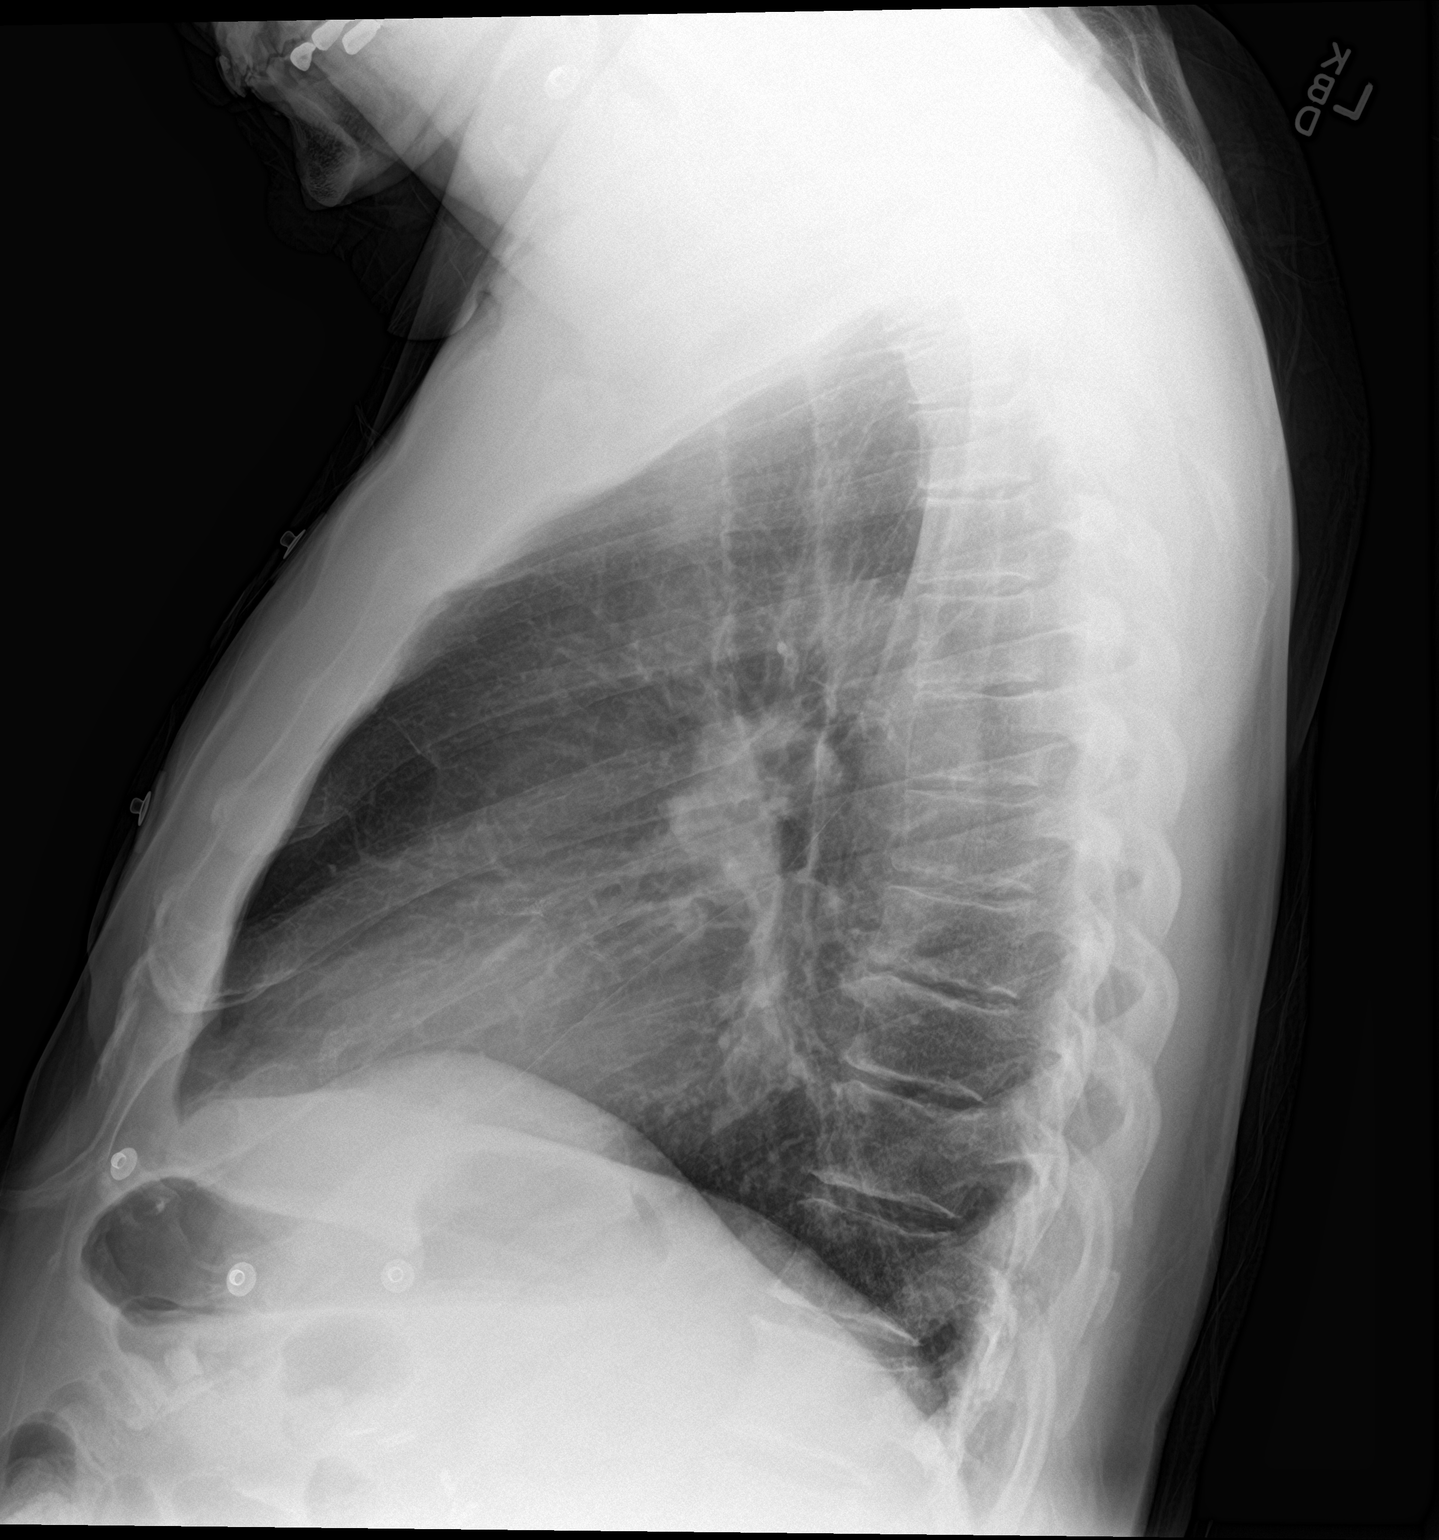

[2 of 2 positions shown; findings below may reference images not displayed]

FINDINGS: The heart size and mediastinal contours are within normal limits.
Both lungs are clear. The visualized skeletal structures are
unremarkable.
IMPRESSION: No acute cardiopulmonary process.

## 2021-10-11 ENCOUNTER — Other Ambulatory Visit: Payer: Self-pay | Admitting: Cardiovascular Disease

## 2021-10-14 ENCOUNTER — Other Ambulatory Visit: Payer: Medicare HMO | Admitting: *Deleted

## 2021-10-14 ENCOUNTER — Other Ambulatory Visit: Payer: Self-pay

## 2021-10-14 DIAGNOSIS — I2510 Atherosclerotic heart disease of native coronary artery without angina pectoris: Secondary | ICD-10-CM

## 2021-10-14 DIAGNOSIS — E785 Hyperlipidemia, unspecified: Secondary | ICD-10-CM

## 2021-10-14 LAB — HEPATIC FUNCTION PANEL
ALT: 30 IU/L (ref 0–44)
AST: 32 IU/L (ref 0–40)
Albumin: 4.4 g/dL (ref 3.7–4.7)
Alkaline Phosphatase: 65 IU/L (ref 44–121)
Bilirubin Total: 0.5 mg/dL (ref 0.0–1.2)
Bilirubin, Direct: 0.14 mg/dL (ref 0.00–0.40)
Total Protein: 6.4 g/dL (ref 6.0–8.5)

## 2021-10-14 LAB — BASIC METABOLIC PANEL
BUN/Creatinine Ratio: 8 — ABNORMAL LOW (ref 10–24)
BUN: 15 mg/dL (ref 8–27)
CO2: 25 mmol/L (ref 20–29)
Calcium: 9.4 mg/dL (ref 8.6–10.2)
Chloride: 103 mmol/L (ref 96–106)
Creatinine, Ser: 1.83 mg/dL — ABNORMAL HIGH (ref 0.76–1.27)
Glucose: 112 mg/dL — ABNORMAL HIGH (ref 70–99)
Potassium: 4.6 mmol/L (ref 3.5–5.2)
Sodium: 140 mmol/L (ref 134–144)
eGFR: 38 mL/min/{1.73_m2} — ABNORMAL LOW (ref >59)

## 2021-10-14 LAB — LIPID PANEL
Chol/HDL Ratio: 2.5 ratio (ref 0.0–5.0)
Cholesterol, Total: 108 mg/dL (ref 100–199)
HDL: 44 mg/dL (ref >39)
LDL Chol Calc (NIH): 46 mg/dL (ref 0–99)
Triglycerides: 91 mg/dL (ref 0–149)
VLDL Cholesterol Cal: 18 mg/dL (ref 5–40)

## 2021-10-14 LAB — CBC
Hematocrit: 44.5 % (ref 37.5–51.0)
Hemoglobin: 14.9 g/dL (ref 13.0–17.7)
MCH: 28.2 pg (ref 26.6–33.0)
MCHC: 33.5 g/dL (ref 31.5–35.7)
MCV: 84 fL (ref 79–97)
Platelets: 135 10*3/uL — ABNORMAL LOW (ref 150–450)
RBC: 5.29 x10E6/uL (ref 4.14–5.80)
RDW: 13.9 % (ref 11.6–15.4)
WBC: 5.4 10*3/uL (ref 3.4–10.8)

## 2021-10-16 ENCOUNTER — Encounter: Payer: Self-pay | Admitting: Cardiovascular Disease

## 2021-10-16 NOTE — Progress Notes (Signed)
Cardiology Office Note:    Date:  10/17/2021   ID:  [name], DOB 1946-06-17, MRN [id number]  PCP:  Binnie Rail, MD  Cardiologist:  Mertie Moores, MD  Electrophysiologist:  None   Referring MD: Binnie Rail, MD   Chief Complaint  Patient presents with   Coronary Artery Disease        Atrial Fibrillation         Previous notes.    [name] is a 76 y.o. male with a hx of history of coronary artery disease.  He status post non-ST segment elevation myocardial infarction in 2011.  He status post DES to the RCA, status post DES to the LAD in 2015, status post DES to the RCA in 2016.  He has mildly reduced left ventricular systolic function by echo in October, 2020.  He also has a history of hypertension, hyperlipidemia, CKD, pulmonary embolus, persistent atrial fibrillation and is on Eliquis.  This is the first time meeting him.   No cp.  Is active , does lots of yard work .   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, 2021: [place] is seen today for follow-up.  He has a history of coronary artery disease.  Status post stenting of his right coronary artery and his left and descending artery.  He has mildly reduced left ventricular systolic function.  He also has a history of hypertension, hyperlipidemia, CKD, pulmonary embolus and persistent atrial fibrillation.  He is on Eliquis.  Saw Scott weaver in early august.  Had some tunnel  Vision,  Thought to have a TIA  CT of the head did not reveal any specific abnormality.  Brain MRI did not reveal any evidence of stroke or TIA.  It revealed only small vessel changes.  TSH was mildly elevated.   I asked him to consult with his medical doctor for further advice on that.    Jan. 12, 2023 [name] is seen today for follow up of his CAD, s/p stenting of his RCA and LAD  Has mildly reduced LV function . Hx of HTN, HLD, CKD , hx of PE   No angina , no dyspnea Getting some regular exercise   Had  COVID in Aug., 2022 Is better .     Past Medical History:  Diagnosis Date   Anemia, mild    CAD (coronary artery disease)    a. 02/2010 NSTEMI/PCI: PROMUS DES to mRCA. b. 06/05/14 Cath/PCI: pLAD 70%--> s/p PCI/DES (Promus DES);  c. 01/2015 Cath/PCI: LM nl, LAD 64m, patent prox stent, LCX patent prox stent, RCA 30p, 76m (3.5x20 Synergy DES). // Myoview 07/2019:  EF 35, atten artifact, no ischemia, intermediate risk (low EF)    CKD (chronic kidney disease), stage III (HCC)    Diastolic dysfunction    a. Echo 6/11: mild LVH, EF 55-60%, GR1DD, Trivial MR, mild LAE. b. echo 06/04/14: EF 55-60%, no WMA, GR1DD, Ao valve mildly calcifed, mild MR, mild LAE   History of pulmonary embolism    Hyperlipidemia    Hypertension    did not tolerate Lisinopril event at low dose   Melanoma ([place])    a. 1987. b. 2012   Myocardial infarction ([place])    Persistent atrial fibrillation ([place]) 12/03/2018   Dx 11/2018 >> Apixaban started // Echo 12/2018: EF 55-60, mod RVE, normal RVSF, mild LAE, trivial MR, mild TR, mod AoV sclerosis    Prostate cancer (HCC)    Dr Karsten Ro  Stroke [facility])    TIA (transient ischemic attack)    a. post cath and PCI on 06/05/2014 - no residual sequelae. // ?recurrent TIA 8/21 >> Carotid US 8/21: normal  // Echocardiogram 8/21: EF 55-60, no RWMA, normal RVSF, mild BAE, trivial MR, trivial AI    Vitamin B12 deficiency     Past Surgical History:  Procedure Laterality Date   CHOLECYSTECTOMY     COLONOSCOPY  2006   CORONARY STENT INTERVENTION N/A 08/05/2019   Procedure: CORONARY STENT INTERVENTION;  Surgeon: Jettie Booze, MD;  Location: [place] CV LAB;  Service: Cardiovascular;  Laterality: N/A;   KNEE ARTHROSCOPY     right   LEFT HEART CATH AND CORONARY ANGIOGRAPHY N/A 08/05/2019   Procedure: LEFT HEART CATH AND CORONARY ANGIOGRAPHY;  Surgeon: Jettie Booze, MD;  Location: [place] CV LAB;  Service: Cardiovascular;  Laterality: N/A;   LEFT HEART CATHETERIZATION WITH  CORONARY ANGIOGRAM N/A 06/05/2014   Procedure: LEFT HEART CATHETERIZATION WITH CORONARY ANGIOGRAM;  Surgeon: Blane Ohara, MD;  Location[facility] C[facility] LAB;  Service: Cardiovascular;  Laterality: N/A;   LEFT HEART CATHETERIZATION WITH CORONARY ANGIOGRAM N/A 01/19/2015   Procedure: LEFT HEART CATHETERIZATION WITH CORONARY ANGIOGRAM;  Surgeon: Peter M Martinique, MD;  Location: [facility] C[facility] LAB;  Service: Cardiovascular;  Laterality: N/A;   MELANOMA EXCISION  2011    L chest   MELANOMA EXCISION  1991   back   mRCA stent  02/2010   PROSTATECTOMY     Dr. Karsten Ro   TONSILLECTOMY      Current Medications: Current Meds  Medication Sig   allopurinol (ZYLOPRIM) 100 MG tablet TAKE 1 TABLET BY MOUTH EVERY DAY   apixaban (ELIQUIS) 5 MG TABS tablet TAKE 1 TABLET BY MOUTH TWICE A DAY   calcium carbonate (TUMS - DOSED IN MG ELEMENTAL CALCIUM) 500 MG chewable tablet Chew 2 tablets by mouth daily as needed for indigestion or heartburn.    clopidogrel (PLAVIX) 75 MG tablet TAKE 1 TABLET BY MOUTH DAILY WITH BREAKFAST.   Cyanocobalamin (VITAMIN B12 PO) Take 1 tablet by mouth every evening.   diltiazem (CARDIZEM CD) 240 MG 24 hr capsule TAKE 1 CAPSULE BY MOUTH EVERY DAY   ezetimibe (ZETIA) 10 MG tablet TAKE 1 TABLET BY MOUTH EVERY DAY   fish oil-omega-3 fatty acids 1000 MG capsule Take 2 g by mouth 2 (two) times daily.     metoprolol tartrate (LOPRESSOR) 50 MG tablet TAKE 1 TABLET BY MOUTH TWICE A DAY   Misc Natural Products (TART CHERRY ADVANCED PO) Take 1,000 mg by mouth daily.   Potassium Gluconate 2 MEQ TABS Take 1 tablet by mouth daily.   rosuvastatin (CRESTOR) 20 MG tablet TAKE 1 TABLET BY MOUTH EVERY DAY     Allergies:   Nitroglycerin and Lidocaine   Social History   Socioeconomic History   Marital status: Married    Spouse name: Not on file   Number of children: Not on file   Years of education: Not on file   Highest education level: Not on file  Occupational History   Not on file  Tobacco Use    Smoking status: Never   Smokeless tobacco: Never  Vaping Use   Vaping Use: Never used  Substance and Sexual Activity   Alcohol use: No    Alcohol/week: 0.0 standard drinks   Drug use: No   Sexual activity: Not on file  Other Topics Concern   Not on file  Social History Narrative  Right handed    Caffeine- 2-3 cups per day    Social Determinants of Health   Financial Resource Strain: Not on file  Food Insecurity: Not on file  Transportation Needs: Not on file  Physical Activity: Not on file  Stress: Not on file  Social Connections: Not on file     Family History: The patient's family history includes Coronary artery disease in his mother; Diabetes in his sister; Heart disease in his mother; Hypertension in his brother; Prostate cancer in his father; Transient ischemic attack (age of onset: 7) in his mother.  ROS:   Please see the history of present illness.     All other systems reviewed and are negative.  EKGs/Labs/Other Studies Reviewed:    The following studies were reviewed today:    EKG:     Recent Labs: 07/26/2021: TSH 5.02 10/14/2021: ALT 30; BUN 15; Creatinine, Ser 1.83; Hemoglobin 14.9; Platelets 135; Potassium 4.6; Sodium 140  Recent Lipid Panel    Component Value Date/Time   CHOL 108 10/14/2021 0853   CHOL 122 (L) 02/07/2016 0745   TRIG 91 10/14/2021 0853   TRIG 113 02/07/2016 0745   TRIG 220 (HH) 09/07/2006 1105   HDL 44 10/14/2021 0853   HDL 38 (L) 02/07/2016 0745   CHOLHDL 2.5 10/14/2021 0853   CHOLHDL 3 09/14/2018 1130   VLDL 24.4 09/14/2018 1130   LDLCALC 46 10/14/2021 0853   LDLCALC 61 02/07/2016 0745   LDLDIRECT 68.8 07/20/2012 0837    Physical Exam:    Physical Exam: Blood pressure 122/70, pulse 68, height 6\' 4"  (1.93 m), weight 215 lb 12.8 oz (97.9 kg), SpO2 97 %.  GEN:  Well nourished, well developed in no acute distress HEENT: Normal NECK: No JVD; No carotid bruits LYMPHATICS: No lymphadenopathy CARDIAC: Irregl. Irreg.    RESPIRATORY:  Clear to auscultation without rales, wheezing or rhonchi  ABDOMEN: Soft, non-tender, non-distended MUSCULOSKELETAL:  No edema; No deformity  SKIN: Warm and dry NEUROLOGIC:  Alert and oriented x 3  ECG: October 17, 2021: Age fibrillation with a heart rate of 68.  No ST or T wave changes.  ASSESSMENT:    1. Coronary artery disease involving native coronary artery of native heart without angina pectoris   2. Persistent atrial fibrillation ([place])   3. Hyperlipidemia LDL goal <70   4. Essential hypertension   5. Persistent atrial fibrillation ([place]), rapid ventricular response     PLAN:     1.  Coronary artery disease:    no angina .  Lipids from this week look great.  LDL is 46 No angina .    2.  Atrial fibrillation:  is persistent AFib. Cont eliquis and diltiazem and metoprolol for rate control.    3.  Hyperlipidemia-  labs from this past Monday look great.  Cont rosuvastatin and zetia 10    4.  Chronic kidney disease:    Medication Adjustments/Labs and Tests Ordered: Current medicines are reviewed at length with the patient today.  Concerns regarding medicines are outlined above.    Orders Placed This Encounter  Procedures   EKG 12-Lead   No orders of the defined types were placed in this encounter.   Patient Instructions  Medication Instructions:  *If you need a refill on your cardiac medications before your next appointment, please call your pharmacy*  Lab Work: If you have labs (blood work) drawn today and your tests are completely normal, you will receive your results only by: [place] (if you have  MyChart) OR A paper copy in the mail If you have any lab test that is abnormal or we need to change your treatment, we will call you to review the results.  Testing/Procedures: None ordered today.  Follow-Up: At [facility], you and your health needs are our priority.  As part of our continuing mission to provide you with exceptional  heart care, we have created designated Provider Care Teams.  These Care Teams include your primary Cardiologist (physician) and Advanced Practice Providers (APPs -  Physician Assistants and Nurse Practitioners) who all work together to provide you with the care you need, when you need it.  We recommend signing up for the patient portal called "MyChart".  Sign up information is provided on this After Visit Summary.  MyChart is used to connect with patients for Virtual Visits (Telemedicine).  Patients are able to view lab/test results, encounter notes, upcoming appointments, etc.  Non-urgent messages can be sent to your provider as well.   To learn more about what you can do with MyChart, go to [email or web address].    Your next appointment:   1 year(s)  The format for your next appointment:   In Person  Provider:   Mertie Moores, MD {     Signed, Mertie Moores, MD  10/17/2021 9:18 AM    [place]

## 2021-10-17 ENCOUNTER — Encounter: Payer: Self-pay | Admitting: Cardiovascular Disease

## 2021-10-17 ENCOUNTER — Other Ambulatory Visit: Payer: Self-pay

## 2021-10-17 ENCOUNTER — Ambulatory Visit: Payer: Medicare HMO | Admitting: Cardiovascular Disease

## 2021-10-17 VITALS — BP 122/70 | HR 68 | Ht 76.0 in | Wt 215.8 lb

## 2021-10-17 DIAGNOSIS — E785 Hyperlipidemia, unspecified: Secondary | ICD-10-CM | POA: Diagnosis not present

## 2021-10-17 DIAGNOSIS — I4819 Other persistent atrial fibrillation: Secondary | ICD-10-CM | POA: Diagnosis not present

## 2021-10-17 DIAGNOSIS — I10 Essential (primary) hypertension: Secondary | ICD-10-CM

## 2021-10-17 DIAGNOSIS — I2510 Atherosclerotic heart disease of native coronary artery without angina pectoris: Secondary | ICD-10-CM

## 2021-10-17 NOTE — Patient Instructions (Signed)
Medication Instructions:  *If you need a refill on your cardiac medications before your next appointment, please call your pharmacy*  Lab Work: If you have labs (blood work) drawn today and your tests are completely normal, you will receive your results only by: [place] (if you have MyChart) OR A paper copy in the mail If you have any lab test that is abnormal or we need to change your treatment, we will call you to review the results.  Testing/Procedures: None ordered today.  Follow-Up: At [facility], you and your health needs are our priority.  As part of our continuing mission to provide you with exceptional heart care, we have created designated Provider Care Teams.  These Care Teams include your primary Cardiologist (physician) and Advanced Practice Providers (APPs -  Physician Assistants and Nurse Practitioners) who all work together to provide you with the care you need, when you need it.  We recommend signing up for the patient portal called "MyChart".  Sign up information is provided on this After Visit Summary.  MyChart is used to connect with patients for Virtual Visits (Telemedicine).  Patients are able to view lab/test results, encounter notes, upcoming appointments, etc.  Non-urgent messages can be sent to your provider as well.   To learn more about what you can do with MyChart, go to [email or web address].    Your next appointment:   1 year(s)  The format for your next appointment:   In Person  Provider:   Mertie Moores, MD {

## 2021-11-16 ENCOUNTER — Other Ambulatory Visit: Payer: Self-pay | Admitting: Cardiovascular Disease

## 2021-11-16 DIAGNOSIS — I4819 Other persistent atrial fibrillation: Secondary | ICD-10-CM

## 2021-11-18 NOTE — Telephone Encounter (Signed)
Prescription refill request for Eliquis received. Indication:Afib  Last office visit:10/17/21 ([place])  Scr: 1.83 (10/14/21) Age: 76 Weight: 97.9kg  Appropriate dose and refill sent to requested pharmacy.

## 2022-01-14 DIAGNOSIS — L814 Other melanin hyperpigmentation: Secondary | ICD-10-CM | POA: Diagnosis not present

## 2022-01-14 DIAGNOSIS — L570 Actinic keratosis: Secondary | ICD-10-CM | POA: Diagnosis not present

## 2022-01-14 DIAGNOSIS — L821 Other seborrheic keratosis: Secondary | ICD-10-CM | POA: Diagnosis not present

## 2022-01-14 DIAGNOSIS — L918 Other hypertrophic disorders of the skin: Secondary | ICD-10-CM | POA: Diagnosis not present

## 2022-01-14 DIAGNOSIS — D1801 Hemangioma of skin and subcutaneous tissue: Secondary | ICD-10-CM | POA: Diagnosis not present

## 2022-01-14 DIAGNOSIS — Z85820 Personal history of malignant melanoma of skin: Secondary | ICD-10-CM | POA: Diagnosis not present

## 2022-01-16 ENCOUNTER — Other Ambulatory Visit: Payer: Self-pay | Admitting: Cardiovascular Disease

## 2022-02-10 ENCOUNTER — Other Ambulatory Visit: Payer: Self-pay | Admitting: Cardiovascular Disease

## 2022-03-21 ENCOUNTER — Ambulatory Visit (INDEPENDENT_AMBULATORY_CARE_PROVIDER_SITE_OTHER): Payer: Medicare HMO

## 2022-03-21 DIAGNOSIS — Z0000 Encounter for general adult medical examination without abnormal findings: Secondary | ICD-10-CM

## 2022-03-21 NOTE — Progress Notes (Signed)
Subjective:   [name] is a 76 y.o. male who presents for an Subsequent l Medicare Annual Wellness Visit.   I connected with [name] today by telephone and verified that I am speaking with the correct person using two identifiers. Location patient: home Location provider: work Persons participating in the virtual visit: patient, provider.   I discussed the limitations, risks, security and privacy concerns of performing an evaluation and management service by telephone and the availability of in person appointments. I also discussed with the patient that there may be a patient responsible charge related to this service. The patient expressed understanding and verbally consented to this telephonic visit.    Interactive audio and video telecommunications were attempted between this provider and patient, however failed, due to patient having technical difficulties OR patient did not have access to video capability.  We continued and completed visit with audio only.    Review of Systems     Cardiac Risk Factors include: advanced age (>43mn, >>69women);dyslipidemia;male gender     Objective:    Today's Vitals   There is no height or weight on file to calculate BMI.     03/21/2022   10:33 AM 08/04/2019    7:00 PM 08/04/2019    8:06 AM 01/18/2015    2:36 PM 06/03/2014    9:13 PM  Advanced Directives  Does Patient Have a Medical Advance Directive? No No No No No  Does patient want to make changes to medical advance directive? No - Patient declined      Would patient like information on creating a medical advance directive?  No - Patient declined No - Patient declined No - patient declined information No - patient declined information    Current Medications (verified) Outpatient Encounter Medications as of 03/21/2022  Medication Sig   allopurinol (ZYLOPRIM) 100 MG tablet TAKE 1 TABLET BY MOUTH EVERY DAY   calcium carbonate (TUMS - DOSED IN MG ELEMENTAL CALCIUM) 500 MG chewable  tablet Chew 2 tablets by mouth daily as needed for indigestion or heartburn.    clopidogrel (PLAVIX) 75 MG tablet TAKE 1 TABLET BY MOUTH EVERY DAY WITH BREAKFAST   Cyanocobalamin (VITAMIN B12 PO) Take 1 tablet by mouth every evening.   diltiazem (CARDIZEM CD) 240 MG 24 hr capsule TAKE 1 CAPSULE BY MOUTH EVERY DAY   ELIQUIS 5 MG TABS tablet TAKE 1 TABLET BY MOUTH TWICE A DAY   ezetimibe (ZETIA) 10 MG tablet TAKE 1 TABLET BY MOUTH EVERY DAY   fish oil-omega-3 fatty acids 1000 MG capsule Take 2 g by mouth 2 (two) times daily.     metoprolol tartrate (LOPRESSOR) 50 MG tablet TAKE 1 TABLET BY MOUTH TWICE A DAY   Misc Natural Products (TART CHERRY ADVANCED PO) Take 1,000 mg by mouth daily.   nitroGLYCERIN (NITROSTAT) 0.4 MG SL tablet Place 1 tablet (0.4 mg total) under the tongue every 5 (five) minutes x 3 doses as needed for chest pain.   Potassium Gluconate 2 MEQ TABS Take 1 tablet by mouth daily.   rosuvastatin (CRESTOR) 20 MG tablet TAKE 1 TABLET BY MOUTH EVERY DAY   No facility-administered encounter medications on file as of 03/21/2022.    Allergies (verified) Nitroglycerin and Lidocaine   History: Past Medical History:  Diagnosis Date   Anemia, mild    CAD (coronary artery disease)    a. 02/2010 NSTEMI/PCI: PROMUS DES to mRCA. b. 06/05/14 Cath/PCI: pLAD 70%--> s/p PCI/DES (Promus DES);  c. 01/2015 Cath/PCI: LM  nl, LAD 55m patent prox stent, LCX patent prox stent, RCA 30p, [id number] Synergy DES). // Myoview 07/2019:  EF 489atten artifact, no ischemia, intermediate risk (low EF)    CKD (chronic kidney disease), stage III (HCC)    Diastolic dysfunction    a. Echo 6/11: mild LVH, EF 55-60%, GR1DD, Trivial MR, mild LAE. b. echo 06/04/14: EF 55-60%, no WMA, GR1DD, Ao valve mildly calcifed, mild MR, mild LAE   History of pulmonary embolism    Hyperlipidemia    Hypertension    did not tolerate Lisinopril event at low dose   [place]   a. [place]. b. [place]   [place]ocardial infarction ([facility]    Persistent [place]   Dx 11/2018 >> Apixaban started // Echo 12/2018: EF 55-60, mod RVE, normal RVSF, mild LAE, trivial MR, mild TR, mod AoV sclerosis    [place]state cancer ([facility]   [name]   TIA (transient ischemic attack)    a. post cath and PCI on 06/05/2014 - no residual sequelae. // ?recurrent TIA 8/21 >> Carotid [place]/21: normal  // Echocardiogram 8/21: EF 55-60, no RWMA, normal RVSF, mild BAE, trivial MR, trivial AI    Vitamin B12 deficiency    Past Surgical History:  Procedure Laterality Date   CHOLECYSTECTOMY     COLONOSCOPY  2006   CORONARY STENT INTERVENTION N/A 08/05/2019   Procedure: CORONARY STENT INTERVENTION;  Surgeon: Brian BoozeMD;  [facility];  Service: Cardiovascular;  Laterality: N/A;   KNEE ARTHROSCOPY     right   LEFT HEART CATH AND CORONARY ANGIOGRAPHY N/A 08/05/2019   Procedure: LEFT HEART CATH AND CORONARY ANGIOGRAPHY;  Surgeon: Brian BoozeMD;  [facility];  Service: Cardiovascular;  Laterality: N/A;   LEFT HEART CATHETERIZATION WITH CORONARY ANGIOGRAM N/A 06/05/2014   Procedure: LEFT HEART CATHETERIZATION WITH CORONARY ANGIOGRAM;  Surgeon: Brian OharaMD;  [name];  Service: Cardiovascular;  Laterality: N/A;   LEFT HEART CATHETERIZATION WITH CORONARY ANGIOGRAM N/A 01/19/2015   Procedure: LEFT HEART CATHETERIZATION WITH CORONARY ANGIOGRAM;  Surgeon: Brian M JoMartiniqueMD;  [facility];  Service: Cardiovascular;  Laterality: N/A;   MELANOMA EXCISION  2011    L chest   MELANOMA EXCISION  1991   back   mRCA stent  02/2010   PROSTATECTOMY     Dr. OtKarsten [name] TONSILLECTOMY     Family History  Problem Relation Age of Onset   Coronary artery disease Mother        S/P CABG, in her 7067s Transient ischemic attack Mother [id number] Heart disease Mother    Prostate cancer Father    Hypertension Brother    Diabetes Sister    Social History    Socioeconomic History   Marital status: Married    Spouse name: Not on file   Number of children: Not on file   Years of education: Not on file   Highest education level: Not on file  Occupational History   Not on file  Tobacco Use   Smoking status: Never   Smokeless tobacco: Never  Vaping Use   Vaping Use: Never used  Substance and Sexual Activity   Alcohol use: No    Alcohol/week: 0.0 standard drinks of alcohol   Drug use: No   Sexual activity: Not on file  Other Topics Concern   Not on file  Social History Narrative   Right handed    Caffeine- 2-3 cups per day    Social Determinants of Health   Financial Resource Strain: Low Risk  (03/21/2022)   Overall Financial Resource Strain (CARDIA)    Difficulty of Paying Living Expenses: Not hard at all  Food Insecurity: No Food Insecurity (03/21/2022)   Hunger Vital Sign    Worried About Running Out of Food in the Last Year: Never true    Ran Out of Food in the Last Year: Never true  Transportation Needs: No Transportation Needs (03/21/2022)   PRAPARE - Hydrologist (Medical): No    Lack of Transportation (Non-Medical): No  Physical Activity: Insufficiently Active (03/21/2022)   Exercise Vital Sign    Days of Exercise per Week: 3 days    Minutes of Exercise per Session: 30 min  Stress: No Stress Concern Present (03/21/2022)   [place]    Feeling of Stress : Not at all  Social Connections: Moderately Isolated (03/21/2022)   Social Connection and Isolation Panel [NHANES]    Frequency of Communication with Friends and Family: Three times a week    Frequency of Social Gatherings with Friends and Family: Three times a week    Attends Religious Services: Never    Active Member of Clubs or Organizations: No    Attends Music therapist: Never    Marital Status: Married    Tobacco Counseling Counseling given: Not  Answered   Clinical Intake:  Pre-visit preparation completed: Yes  Pain : No/denies pain     Nutritional Risks: None Diabetes: No  How often do you need to have someone help you when you read instructions, pamphlets, or other written materials from your doctor or pharmacy?: 1 - Never What is the last grade level you completed in school?: [place]   Interpreter Needed?: No  Information entered by :: [facility] [employer]    03/21/2022   10:35 AM  In your present state of health, do you have any difficulty performing the following activities:  Hearing? 0  Vision? 0  Difficulty concentrating or making decisions? 0  Walking or climbing stairs? 0  Dressing or bathing? 0  Doing errands, shopping? 0  Preparing Food and eating ? N  Using the Toilet? N  In the past six months, have you accidently leaked urine? N  Do you have problems with loss of bowel control? N  Managing your Medications? N  Managing your Finances? N  Housekeeping or managing your Housekeeping? N    Patient Care Team: Binnie Rail, MD as PCP - General (Internal Medicine) Nahser, Wonda Cheng, MD as PCP - Cardiology (Cardiology)  Indicate any recent Medical Services you may have received from other than [facility] providers in the past year (date may be approximate).     Assessment:   This is a routine wellness examination for [name].  Hearing/Vision screen Vision Screening - Comments:: Annual eye exams wears glasses   Dietary issues and exercise activities discussed: Current Exercise Habits: Home exercise routine, Type of exercise: walking, Time (Minutes): 30, Frequency (Times/Week): 3, Weekly Exercise (Minutes/Week): 90, Intensity: Mild, Exercise limited by: None identified   Goals Addressed   None    Depression Screen    03/21/2022   10:34 AM 03/21/2022   10:31 AM 07/26/2021    5:13 PM 01/27/2020    2:42 PM 09/04/2017  1:40 PM 11/05/2015   10:16 AM 10/03/2014    10:38 AM  PHQ 2/9 Scores  PHQ - 2 Score 0 0 3 0 0 0 0  PHQ- 9 Score   7        Fall Risk    03/21/2022   10:33 AM 07/26/2021    2:29 PM 01/27/2020    2:41 PM 11/05/2015   10:16 AM 10/03/2014   10:38 AM  Fall Risk   Falls in the past year? 0 0 0 No No  Number falls in past yr: 0 0 0    Injury with Fall? 0 0 0    Risk for fall due to :  No Fall Risks     Follow up Falls evaluation completed;Education provided Falls evaluation completed       [place]:  Any stairs in or around the home? Yes  If so, are there any without handrails? No  Home free of loose throw rugs in walkways, pet beds, electrical cords, etc? Yes  Adequate lighting in your home to reduce risk of falls? Yes   ASSISTIVE DEVICES UTILIZED TO PREVENT FALLS:  Life alert? No  Use of a cane, walker or w/c? No  Grab bars in the bathroom? Yes  Shower chair or bench in shower? No  Elevated toilet seat or a handicapped toilet? No     Cognitive Function:Normal cognitive status assessed by telephone conversation by this Nurse Health Advisor. No abnormalities found.      11/05/2015   10:19 AM  MMSE - Mini Mental State Exam  Not completed: Refused        Immunizations Immunization History  Administered Date(s) Administered   PFIZER(Purple Top)SARS-COV-2 Vaccination 12/08/2019, 01/03/2020   Td 06/14/2010    TDAP status: Due, Education has been provided regarding the importance of this vaccine. Advised may receive this vaccine at local pharmacy or Health Dept. Aware to provide a copy of the vaccination record if obtained from local pharmacy or Health Dept. Verbalized acceptance and understanding.  Flu Vaccine status: Declined, Education has been provided regarding the importance of this vaccine but patient still declined. Advised may receive this vaccine at local pharmacy or Health Dept. Aware to provide a copy of the vaccination record if obtained from local pharmacy or Health  Dept. Verbalized acceptance and understanding.  Pneumococcal vaccine status: Due, Education has been provided regarding the importance of this vaccine. Advised may receive this vaccine at local pharmacy or Health Dept. Aware to provide a copy of the vaccination record if obtained from local pharmacy or Health Dept. Verbalized acceptance and understanding.  Covid-19 vaccine status: Completed vaccines  Qualifies for Shingles Vaccine? Yes   Zostavax completed No   Shingrix Completed?: No.    Education has been provided regarding the importance of this vaccine. Patient has been advised to call insurance company to determine out of pocket expense if they have not yet received this vaccine. Advised may also receive vaccine at local pharmacy or Health Dept. Verbalized acceptance and understanding.  Screening Tests Health Maintenance  Topic Date Due   Zoster Vaccines- Shingrix (1 of 2) Never done   Pneumonia Vaccine 43+ Years old (1 - PCV) Never done   COVID-19 Vaccine (3 - Pfizer risk series) 01/31/2020   TETANUS/TDAP  06/14/2020   INFLUENZA VACCINE  05/06/2022   COLONOSCOPY (Pts 45-83yr Insurance coverage will need to be confirmed)  10/27/2022   Hepatitis C Screening  Completed   HPV VACCINES  Aged Out    Health Maintenance  Health Maintenance Due  Topic Date Due   Zoster Vaccines- Shingrix (1 of 2) Never done   Pneumonia Vaccine 15+ Years old (1 - PCV) Never done   COVID-19 Vaccine (3 - Pfizer risk series) 01/31/2020   TETANUS/TDAP  06/14/2020    Colorectal cancer screening: No longer required.   Lung Cancer Screening: (Low Dose CT Chest recommended if Age 68-80 years, 30 pack-year currently smoking OR have quit w/in 15years.) does not qualify.   Lung Cancer Screening Referral: n/a  Additional Screening:  Hepatitis C Screening: does not qualify;   Vision Screening: Recommended annual ophthalmology exams for early detection of glaucoma and other disorders of the eye. Is the  patient up to date with their annual eye exam?  Yes  Who is the provider or what is the name of the office in which the patient attends annual eye exams? [facility]  If pt is not established with a provider, would they like to be referred to a provider to establish care? No .   Dental Screening: Recommended annual dental exams for proper oral hygiene  Community Resource Referral / Chronic Care Management: CRR required this visit?  No   CCM required this visit?  No      Plan:     I have personally reviewed and noted the following in the patient's chart:   Medical and social history Use of alcohol, tobacco or illicit drugs  Current medications and supplements including opioid prescriptions. Patient is not currently taking opioid prescriptions. Functional ability and status Nutritional status Physical activity Advanced directives List of other physicians Hospitalizations, surgeries, and ER visits in previous 12 months Vitals Screenings to include cognitive, depression, and falls Referrals and appointments  In addition, I have reviewed and discussed with patient certain preventive protocols, quality metrics, and best practice recommendations. A written personalized care plan for preventive services as well as general preventive health recommendations were provided to patient.     Randel Pigg, LPN   [id number]   Nurse Notes: none

## 2022-03-21 NOTE — Patient Instructions (Signed)
[name] , Thank you for taking time to come for your Medicare Wellness Visit. I appreciate your ongoing commitment to your health goals. Please review the following plan we discussed and let me know if I can assist you in the future.   Screening recommendations/referrals: Colonoscopy: no longer required  Recommended yearly ophthalmology/optometry visit for glaucoma screening and checkup Recommended yearly dental visit for hygiene and checkup  Vaccinations: Influenza vaccine: declined  Pneumococcal vaccine: due  Tdap vaccine: due  Shingles vaccine: will consider     Advanced directives: none   Conditions/risks identified: none   Next appointment: none   Preventive Care 64 Years and Older, Male Preventive care refers to lifestyle choices and visits with your health care provider that can promote health and wellness. What does preventive care include? A yearly physical exam. This is also called an annual well check. Dental exams once or twice a year. Routine eye exams. Ask your health care provider how often you should have your eyes checked. Personal lifestyle choices, including: Daily care of your teeth and gums. Regular physical activity. Eating a healthy diet. Avoiding tobacco and drug use. Limiting alcohol use. Practicing safe sex. Taking low doses of aspirin every day. Taking vitamin and mineral supplements as recommended by your health care provider. What happens during an annual well check? The services and screenings done by your health care provider during your annual well check will depend on your age, overall health, lifestyle risk factors, and family history of disease. Counseling  Your health care provider may ask you questions about your: Alcohol use. Tobacco use. Drug use. Emotional well-being. Home and relationship well-being. Sexual activity. Eating habits. History of falls. Memory and ability to understand (cognition). Work and work  Statistician. Screening  You may have the following tests or measurements: Height, weight, and BMI. Blood pressure. Lipid and cholesterol levels. These may be checked every 5 years, or more frequently if you are over 48 years old. Skin check. Lung cancer screening. You may have this screening every year starting at age 78 if you have a 30-pack-year history of smoking and currently smoke or have quit within the past 15 years. Fecal occult blood test (FOBT) of the stool. You may have this test every year starting at age 59. Flexible sigmoidoscopy or colonoscopy. You may have a sigmoidoscopy every 5 years or a colonoscopy every 10 years starting at age 39. Prostate cancer screening. Recommendations will vary depending on your family history and other risks. Hepatitis C blood test. Hepatitis B blood test. Sexually transmitted disease (STD) testing. Diabetes screening. This is done by checking your blood sugar (glucose) after you have not eaten for a while (fasting). You may have this done every 1-3 years. Abdominal aortic aneurysm (AAA) screening. You may need this if you are a current or former smoker. Osteoporosis. You may be screened starting at age 83 if you are at high risk. Talk with your health care provider about your test results, treatment options, and if necessary, the need for more tests. Vaccines  Your health care provider may recommend certain vaccines, such as: Influenza vaccine. This is recommended every year. Tetanus, diphtheria, and acellular pertussis (Tdap, Td) vaccine. You may need a Td booster every 10 years. Zoster vaccine. You may need this after age 37. Pneumococcal 13-valent conjugate (PCV13) vaccine. One dose is recommended after age 61. Pneumococcal polysaccharide (PPSV23) vaccine. One dose is recommended after age 106. Talk to your health care provider about which screenings and vaccines you need  and how often you need them. This information is not intended to replace  advice given to you by your health care provider. Make sure you discuss any questions you have with your health care provider. Document Released: 10/19/2015 Document Revised: 06/11/2016 Document Reviewed: 07/24/2015 Elsevier Interactive Patient Education  2017 [place] Prevention in the Home Falls can cause injuries. They can happen to people of all ages. There are many things you can do to make your home safe and to help prevent falls. What can I do on the outside of my home? Regularly fix the edges of walkways and driveways and fix any cracks. Remove anything that might make you trip as you walk through a door, such as a raised step or threshold. Trim any bushes or trees on the path to your home. Use bright outdoor lighting. Clear any walking paths of anything that might make someone trip, such as rocks or tools. Regularly check to see if handrails are loose or broken. Make sure that both sides of any steps have handrails. Any raised decks and porches should have guardrails on the edges. Have any leaves, snow, or ice cleared regularly. Use sand or salt on walking paths during winter. Clean up any spills in your garage right away. This includes oil or grease spills. What can I do in the bathroom? Use night lights. Install grab bars by the toilet and in the tub and shower. Do not use towel bars as grab bars. Use non-skid mats or decals in the tub or shower. If you need to sit down in the shower, use a plastic, non-slip stool. Keep the floor dry. Clean up any water that spills on the floor as soon as it happens. Remove soap buildup in the tub or shower regularly. Attach bath mats securely with double-sided non-slip rug tape. Do not have throw rugs and other things on the floor that can make you trip. What can I do in the bedroom? Use night lights. Make sure that you have a light by your bed that is easy to reach. Do not use any sheets or blankets that are too big for your bed.  They should not hang down onto the floor. Have a firm chair that has side arms. You can use this for support while you get dressed. Do not have throw rugs and other things on the floor that can make you trip. What can I do in the kitchen? Clean up any spills right away. Avoid walking on wet floors. Keep items that you use a lot in easy-to-reach places. If you need to reach something above you, use a strong step stool that has a grab bar. Keep electrical cords out of the way. Do not use floor polish or wax that makes floors slippery. If you must use wax, use non-skid floor wax. Do not have throw rugs and other things on the floor that can make you trip. What can I do with my [name]? Do not leave any items on the [name]. Make sure that there are handrails on both sides of the [name] and use them. Fix handrails that are broken or loose. Make sure that handrails are as long as the stairways. Check any carpeting to make sure that it is firmly attached to the [name]. Fix any carpet that is loose or worn. Avoid having throw rugs at the top or bottom of the [name]. If you do have throw rugs, attach them to the floor with carpet tape. Make sure that you  have a light switch at the top of the [name] and the bottom of the [name]. If you do not have them, ask someone to add them for you. What else can I do to help prevent falls? Wear shoes that: Do not have high heels. Have rubber bottoms. Are comfortable and fit you well. Are closed at the toe. Do not wear sandals. If you use a stepladder: Make sure that it is fully opened. Do not climb a closed stepladder. Make sure that both sides of the stepladder are locked into place. Ask someone to hold it for you, if possible. Clearly mark and make sure that you can see: Any grab bars or handrails. First and last steps. Where the edge of each step is. Use tools that help you move around (mobility aids) if they are needed. These  include: Canes. Walkers. Scooters. Crutches. Turn on the lights when you go into a dark area. Replace any light bulbs as soon as they burn out. Set up your furniture so you have a clear path. Avoid moving your furniture around. If any of your floors are uneven, fix them. If there are any pets around you, be aware of where they are. Review your medicines with your doctor. Some medicines can make you feel dizzy. This can increase your chance of falling. Ask your doctor what other things that you can do to help prevent falls. This information is not intended to replace advice given to you by your health care provider. Make sure you discuss any questions you have with your health care provider. Document Released: 07/19/2009 Document Revised: 02/28/2016 Document Reviewed: 10/27/2014 Elsevier Interactive Patient Education  2017 [employer].

## 2022-05-01 ENCOUNTER — Other Ambulatory Visit: Payer: Self-pay | Admitting: Cardiovascular Disease

## 2022-06-29 ENCOUNTER — Other Ambulatory Visit: Payer: Self-pay | Admitting: Cardiovascular Disease

## 2022-07-03 DIAGNOSIS — M25571 Pain in right ankle and joints of right foot: Secondary | ICD-10-CM | POA: Diagnosis not present

## 2022-07-03 IMAGING — CT CT HEAD W/O CM
3 of 4 series · 15 of 47 positions shown, 18 images · non-contrast
Comparison: 06/07/2014

CLINICAL DATA: TIA.  Double vision for 2-3 hours.

EXAM:
CT HEAD WITHOUT CONTRAST
TECHNIQUE: Contiguous axial images were obtained from the base of the skull
through the vertex without intravenous contrast.

[Series 2: head 5.0 hc40 · axial · 0.43mm/px · z∈[+168,+288]mm · 9 of 29 slices shown, 12 images]
[im 3/29  brain]
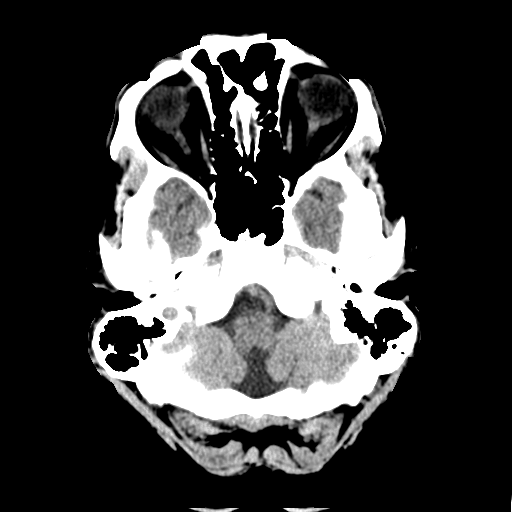
[im 3/29  bone]
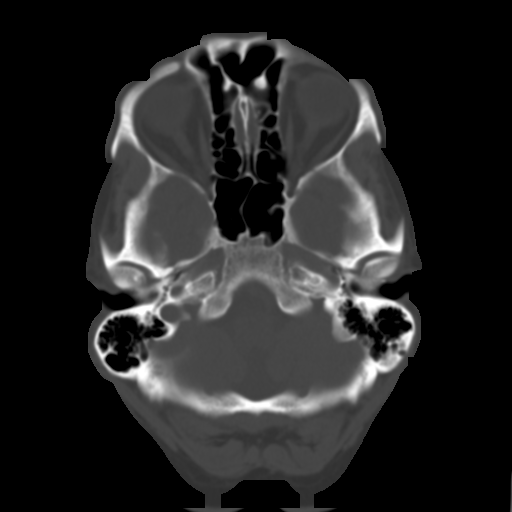
[im 7/29  brain]
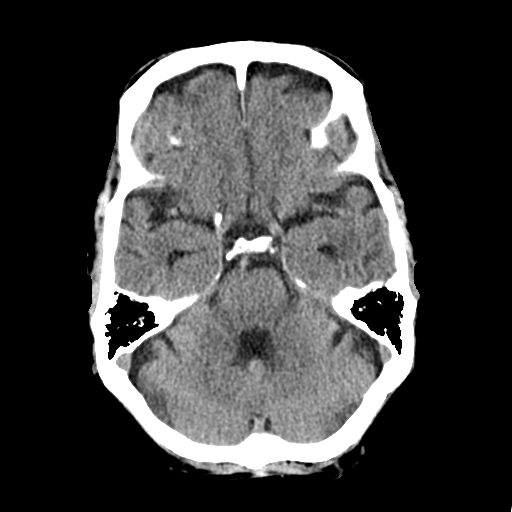
[im 9/29  brain]
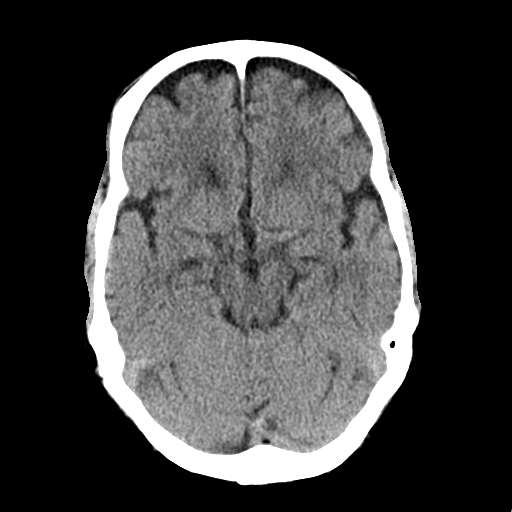
[im 13/29  brain]
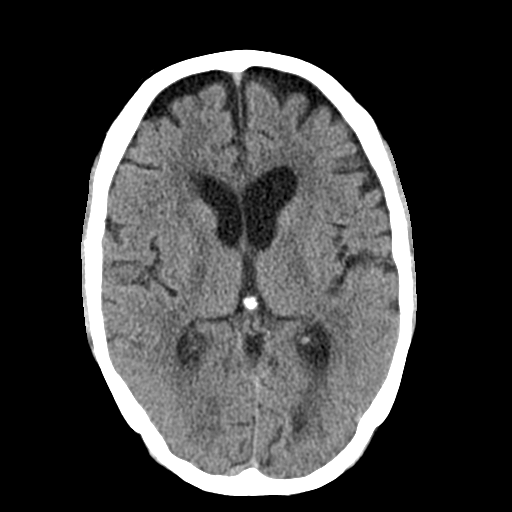
[im 15/29  brain]
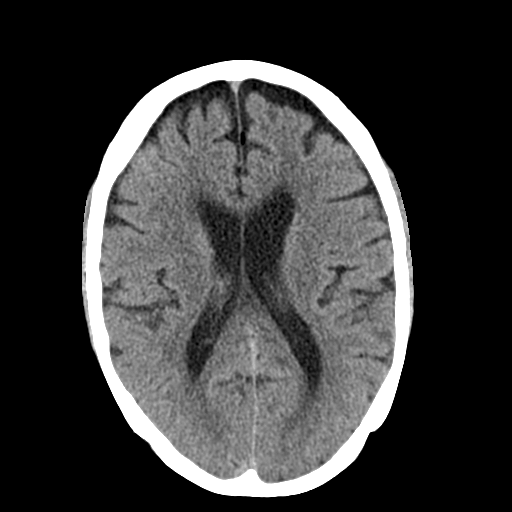
[im 15/29  bone]
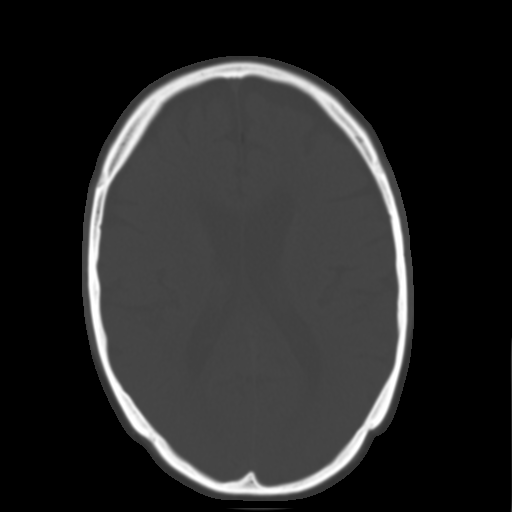
[im 17/29  brain]
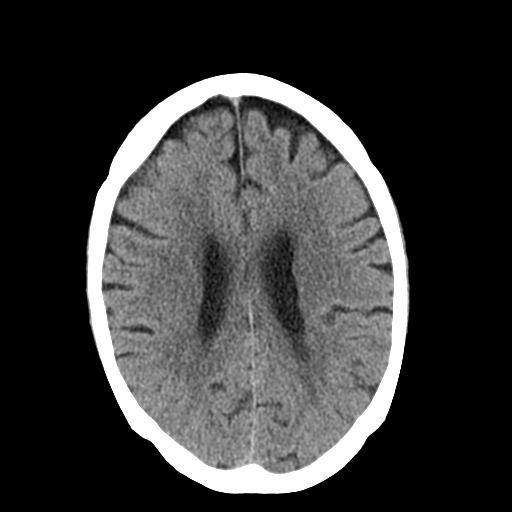
[im 21/29  brain]
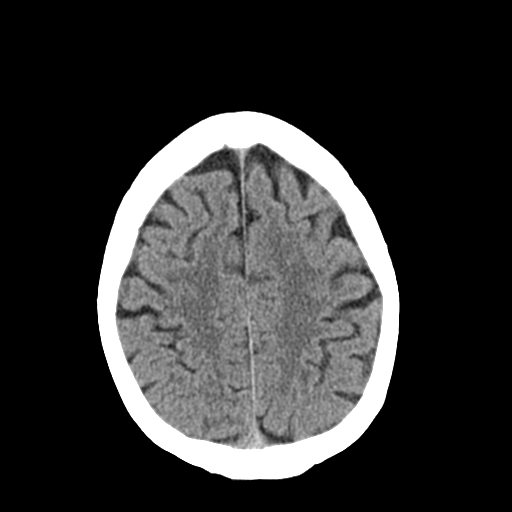
[im 23/29  brain]
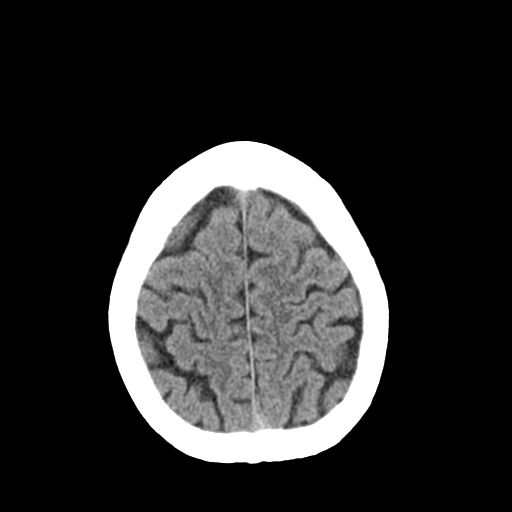
[im 27/29  brain]
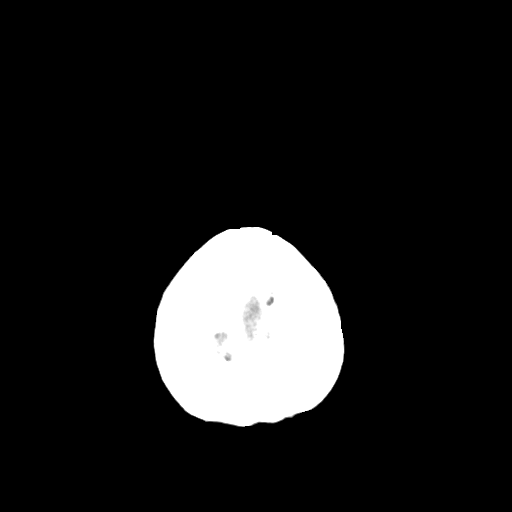
[im 27/29  bone]
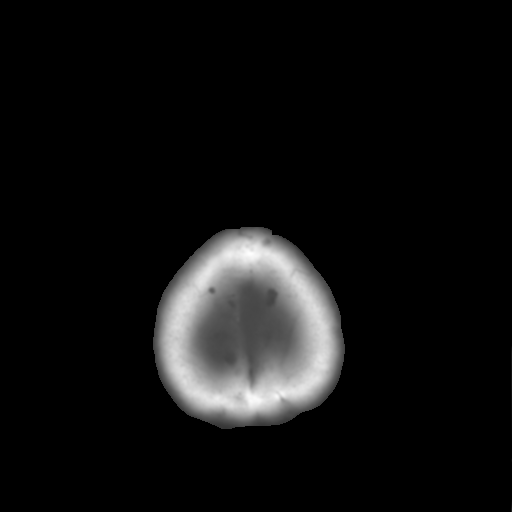

[Series 4: head 3.0 mpr cor · coronal · 0.30mm/px · 3 of 66 slices shown]
[im 22/66  brain]
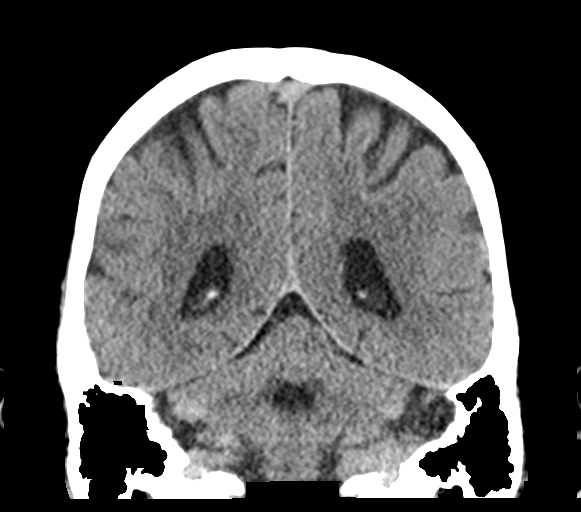
[im 29/66  brain]
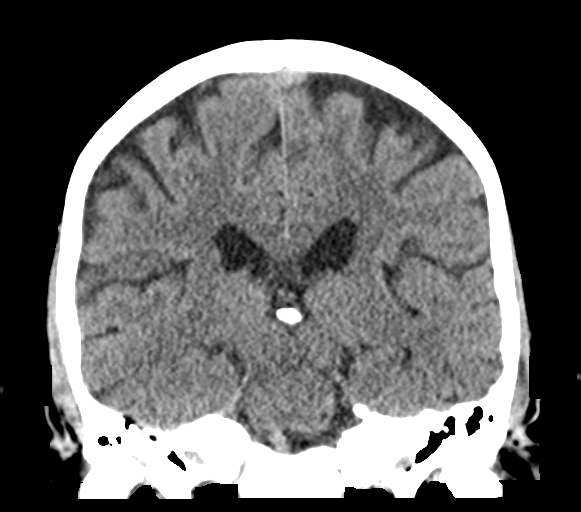
[im 37/66  brain]
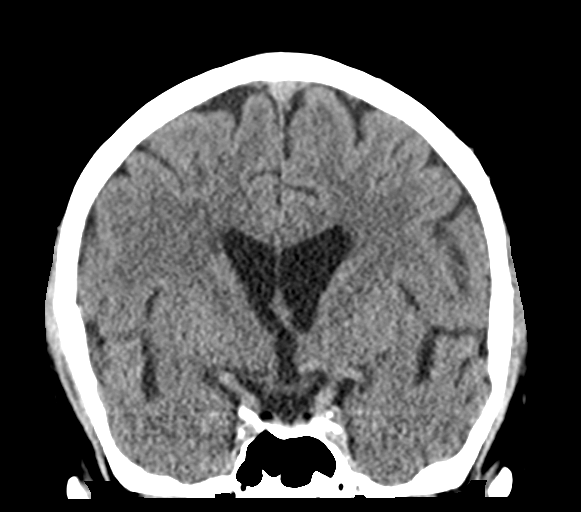

[Series 5: head 3.0 mpr sag · sagittal · 0.29mm/px · 3 of 52 slices shown]
[im 18/52  brain]
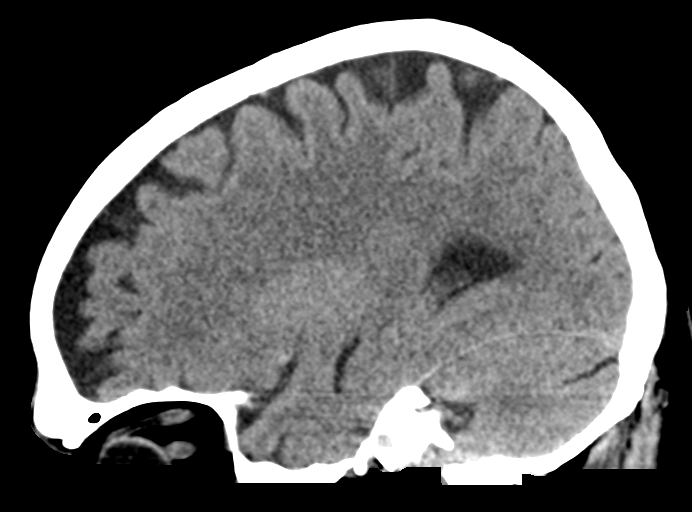
[im 26/52  brain]
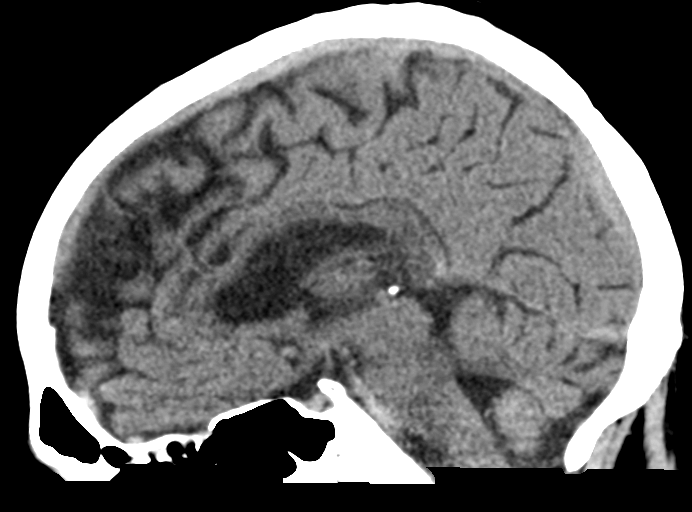
[im 35/52  brain]
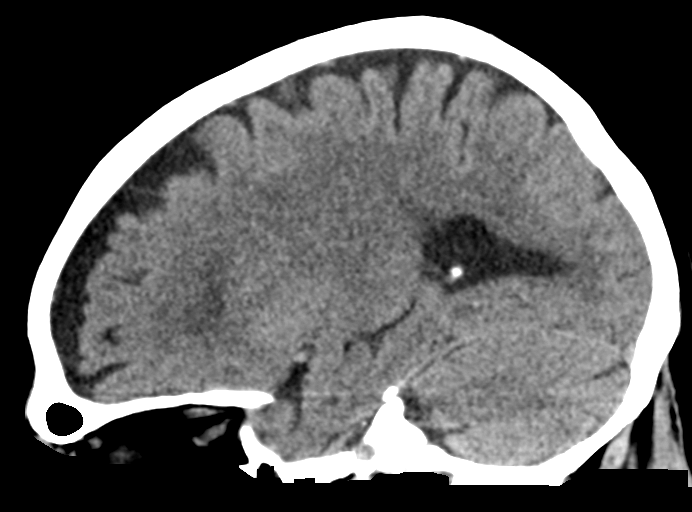

[15 of 47 positions shown; findings below may reference images not displayed]

FINDINGS: Brain: There is no evidence for acute hemorrhage, hydrocephalus,
mass lesion, or abnormal extra-axial fluid collection. No definite
CT evidence for acute infarction. Diffuse loss of parenchymal volume
is consistent with atrophy. Patchy low attenuation in the deep
hemispheric and periventricular white matter is nonspecific, but
likely reflects chronic microvascular ischemic demyelination.

Vascular: No hyperdense vessel or unexpected calcification.

Skull: No evidence for fracture. No worrisome lytic or sclerotic
lesion.

Sinuses/Orbits: The visualized paranasal sinuses and mastoid air
cells are clear. Visualized portions of the globes and intraorbital
fat are unremarkable.

Other: None.
IMPRESSION: 1. No acute intracranial abnormality.
2. Atrophy and chronic small vessel white matter ischemic disease.

## 2022-08-08 ENCOUNTER — Other Ambulatory Visit: Payer: Self-pay | Admitting: Cardiovascular Disease

## 2022-08-08 DIAGNOSIS — I4819 Other persistent atrial fibrillation: Secondary | ICD-10-CM

## 2022-08-08 NOTE — Telephone Encounter (Signed)
Prescription refill request for Eliquis received. Indication:  Last office visit: 10/17/21 ([name])  Scr: 1.83 (10/14/21)  Age: 76 Weight: 97.9kg  Appropriate dose and refill sent to requested pharmacy.

## 2022-08-26 NOTE — Progress Notes (Unsigned)
Cardiology Office Note:    Date:  08/27/2022   ID:  [name], DOB 1946-06-18, MRN [id number]  PCP:  Binnie Rail, MD   [facility] Providers Cardiologist:  Mertie Moores, MD     Referring MD: Binnie Rail, MD   Chief Complaint: memory loss  History of Present Illness:    [name] is a very pleasant  76 y.o. male with a hx of   Coronary artery disease S/p NSTEMI 02/2010 >> PCI: DES to the mid RCA S/p NSTEMI in 2015 >> PCI: DES to the proximal LAD C/b post cath TIA Cath/PCI 01/2015:  DES to mid RCA S/p DES to LAD in 07/2019 Persistent atrial fibrillation CHADS2-VASc=6 (CVA, CAD, HTN, age x2) >> Apixaban  Hypertension Hyperlipidemia Chronic kidney disease History of pulmonary embolism Sleep apnea Prostate CA  Concern for TIA when seen by [name] August 2021.  CT of head did not show evidence of bleed or prior stroke.  No acute findings on brain MR 05/31/2020.  Last cardiology clinic visit was 10/17/2021 with Dr. Acie Fredrickson.  He was maintaining rate control for A-fib with diltiazem and metoprolol.  No changes were made to treatment regimen and 1 year follow-up was recommended.  Today, he is here with his wife. Is concerned that some of his medications are causing memory loss. [facility] that some medications cause memory loss and others that should not be taken together. He feels that his memory has worsened over last year. Symptoms of short term memory loss, thoughts go away quickly. Is able to remember things that happened a long time ago.  Was advised to undergo memory testing by PCP. He denies chest pain, shortness of breath, lower extremity edema, fatigue, palpitations, melena, hematuria, hemoptysis, diaphoresis, weakness, presyncope, syncope, orthopnea, and PND.    Past Medical History:  Diagnosis Date   Anemia, mild    CAD (coronary artery disease)    a. 02/2010 NSTEMI/PCI: PROMUS DES to mRCA. b. 06/05/14 Cath/PCI: pLAD 70%--> s/p PCI/DES  (Promus DES);  c. 01/2015 Cath/PCI: LM nl, LAD 62m patent prox stent, LCX patent prox stent, RCA 30p, [id number] Synergy DES). // Myoview 07/2019:  EF 4857atten artifact, no ischemia, intermediate risk (low EF)    CKD (chronic kidney disease), stage III (HCC)    Diastolic dysfunction    a. Echo 6/11: mild LVH, EF 55-60%, GR1DD, Trivial MR, mild LAE. b. echo 06/04/14: EF 55-60%, no WMA, GR1DD, Ao valve mildly calcifed, mild MR, mild LAE   History of pulmonary embolism    Hyperlipidemia    Hypertension    did not tolerate Lisinopril event at low dose   [place] ([facility]   a. 1987. b. 2012   [place] [place]   [place]   Dx 11/2018 >> Apixaban started // Echo 12/2018: EF 55-60, mod RVE, normal RVSF, mild LAE, trivial MR, mild TR, mod AoV sclerosis    [place]state cancer [facility]   [facility]   TIA (transient ischemic attack)    a. post cath and PCI on 06/05/2014 - no residual sequelae. // ?recurrent TIA 8/21 >> Carotid [place]/21: normal  // Echocardiogram 8/21: EF 55-60, no RWMA, normal RVSF, mild BAE, trivial MR, trivial AI    Vitamin B12 deficiency     Past Surgical History:  Procedure Laterality Date   CHOLECYSTECTOMY     COLONOSCOPY  2006   CORONARY STENT  INTERVENTION N/A 08/05/2019   Procedure: CORONARY STENT INTERVENTION;  Surgeon: Jettie Booze, MD;  Location: [facility];  Service: Cardiovascular;  Laterality: N/A;   KNEE ARTHROSCOPY     right   LEFT HEART CATH AND CORONARY ANGIOGRAPHY N/A 08/05/2019   Procedure: LEFT HEART CATH AND CORONARY ANGIOGRAPHY;  Surgeon: Jettie Booze, MD;  Location: [facility];  Service: Cardiovascular;  Laterality: N/A;   LEFT HEART CATHETERIZATION WITH CORONARY ANGIOGRAM N/A 06/05/2014   Procedure: LEFT HEART CATHETERIZATION WITH CORONARY ANGIOGRAM;  Surgeon: Blane Ohara, MD;  Location[facility] C[facility] LAB;  Service: Cardiovascular;  Laterality: N/A;   LEFT HEART  CATHETERIZATION WITH CORONARY ANGIOGRAM N/A 01/19/2015   Procedure: LEFT HEART CATHETERIZATION WITH CORONARY ANGIOGRAM;  Surgeon: Peter M Martinique, MD;  Location: [facility] CATH LAB;  Service: Cardiovascular;  Laterality: N/A;   MELANOMA EXCISION  2011    L chest   MELANOMA EXCISION  1991   back   mRCA stent  02/2010   PROSTATECTOMY     Dr. Karsten Ro   TONSILLECTOMY      Current Medications: Current Meds  Medication Sig   allopurinol (ZYLOPRIM) 100 MG tablet TAKE 1 TABLET BY MOUTH EVERY DAY   calcium carbonate (TUMS - DOSED IN MG ELEMENTAL CALCIUM) 500 MG chewable tablet Chew 2 tablets by mouth daily as needed for indigestion or heartburn.    clopidogrel (PLAVIX) 75 MG tablet TAKE 1 TABLET BY MOUTH EVERY DAY WITH BREAKFAST   Cyanocobalamin (VITAMIN B12 PO) Take 1 tablet by mouth every evening.   diltiazem (CARDIZEM CD) 240 MG 24 hr capsule TAKE 1 CAPSULE BY MOUTH EVERY DAY   ELIQUIS 5 MG TABS tablet TAKE 1 TABLET BY MOUTH TWICE A DAY   ezetimibe (ZETIA) 10 MG tablet TAKE 1 TABLET BY MOUTH EVERY DAY   fish oil-omega-3 fatty acids 1000 MG capsule Take 2 g by mouth 2 (two) times daily.     metoprolol tartrate (LOPRESSOR) 50 MG tablet TAKE 1 TABLET BY MOUTH TWICE A DAY   Misc Natural Products (TART CHERRY ADVANCED PO) Take 1,000 mg by mouth daily.   nitroGLYCERIN (NITROSTAT) 0.4 MG SL tablet Place 1 tablet (0.4 mg total) under the tongue every 5 (five) minutes x 3 doses as needed for chest pain.   Potassium Gluconate 2 MEQ TABS Take 1 tablet by mouth daily.   [DISCONTINUED] rosuvastatin (CRESTOR) 20 MG tablet TAKE 1 TABLET BY MOUTH EVERY DAY     Allergies:   Nitroglycerin and Lidocaine   Social History   Socioeconomic History   Marital status: Married    Spouse name: Not on file   Number of children: Not on file   Years of education: Not on file   Highest education level: Not on file  Occupational History   Not on file  Tobacco Use   Smoking status: Never   Smokeless tobacco: Never   Vaping Use   Vaping Use: Never used  Substance and Sexual Activity   Alcohol use: No    Alcohol/week: 0.0 standard drinks of alcohol   Drug use: No   Sexual activity: Not on file  Other Topics Concern   Not on file  Social History Narrative   Right handed    Caffeine- 2-3 cups per day    Social Determinants of Health   Financial Resource Strain: Low Risk  (03/21/2022)   Overall Financial Resource Strain (CARDIA)    Difficulty of Paying Living Expenses: Not hard at all  Food  Insecurity: No Food Insecurity (03/21/2022)   Hunger Vital Sign    Worried About Running Out of Food in the Last Year: Never true    Ran Out of Food in the Last Year: Never true  Transportation Needs: No Transportation Needs (03/21/2022)   PRAPARE - Hydrologist (Medical): No    Lack of Transportation (Non-Medical): No  Physical Activity: Insufficiently Active (03/21/2022)   Exercise Vital Sign    Days of Exercise per Week: 3 days    Minutes of Exercise per Session: 30 min  Stress: No Stress Concern Present (03/21/2022)   [place]    Feeling of Stress : Not at all  Social Connections: Moderately Isolated (03/21/2022)   Social Connection and Isolation Panel [NHANES]    Frequency of Communication with Friends and Family: Three times a week    Frequency of Social Gatherings with Friends and Family: Three times a week    Attends Religious Services: Never    Active Member of Clubs or Organizations: No    Attends Archivist Meetings: Never    Marital Status: Married     Family History: The patient's family history includes Coronary artery disease in his mother; Diabetes in his sister; Heart disease in his mother; Hypertension in his brother; Prostate cancer in his father; Transient ischemic attack (age of onset: 50) in his mother.  ROS:   Please see the history of present illness.   All other systems  reviewed and are negative.  Labs/Other Studies Reviewed:    The following studies were reviewed today:  Prior CV studies: Echocardiogram 05/25/20 EF 55-60, mild biatrial enlargement, mild TR, trivial MR, normal RV  Carotid Duplex 05/16/20 No stenosis bilateral carotids  Cardiac catheterization 08/05/2019 RCA mid 25, prox stent patent LAD prox stent patent, mid 90 PCI:  2.5 x 24 mm Synergy DES to mLAD    Echocardiogram 08/05/2019 EF 55-60, mild LAE, mild MR, mild TR, RVSP 39.1   Myoview 07/29/2019 Small size, moderate intensity basal to mid inferior perfusion defect that is seen in the rest and stress supine images, but resolves in the stress upright images, this suggests bowel attenuation artifact. LVEF 48% with mild inferior hypokinesis - however, visually, the LVEF appears higher. Overall this is an intermediate risk study, but no significant reversible ischemia is suspected.   LT Cardiac Monitor (Zio) 01/20/2019 Atrial fibrillation, Avg HR 75 Rare PVCs or aberrantly conducted beats No significant pauses or episodes of tachycardia   Echo 12/08/2018 EF 55-60, mild LAE, trivial MR, mild TR   Cardiac Catheterization 01/19/15 Left mainstem: Normal Left anterior descending (LAD): The stent in the proximal LAD is widely patent. There is a 50% stenosis in the mid vessel.  Left circumflex (LCx): There is a stent in the proximal LCx and it is widely patent. No obstructive disease. Right coronary artery (RCA): The RCA is a large dominant vessel. It is tortuous. There is diffuse 30% disease in the proximal vessel. There is a focal 90% stenosis in the mid vessel.  Left ventriculography: Not done due to CKD.  PCI:  3.5 x 20 mm Synergy DES to mid RCA   Echo 06/04/14 EF 55-60, no RWMA, Gr 1 DD, mild AoV calcification, mild MR, mild LAE   Recent Labs: 10/14/2021: ALT 30; BUN 15; Creatinine, Ser 1.83; Hemoglobin 14.9; Platelets 135; Potassium 4.6; Sodium 140  Recent Lipid Panel    Component  Value Date/Time   CHOL  108 10/14/2021 0853   CHOL 122 (L) 02/07/2016 0745   TRIG 91 10/14/2021 0853   TRIG 113 02/07/2016 0745   TRIG 220 (HH) 09/07/2006 1105   HDL 44 10/14/2021 0853   HDL 38 (L) 02/07/2016 0745   CHOLHDL 2.5 10/14/2021 0853   CHOLHDL 3 09/14/2018 1130   VLDL 24.4 09/14/2018 1130   LDLCALC 46 10/14/2021 0853   LDLCALC 61 02/07/2016 0745   LDLDIRECT 68.8 07/20/2012 0837     Risk Assessment/Calculations:    CHA2DS2-VASc Score = 4   This indicates a 4.8% annual risk of stroke. The patient's score is based upon: CHF History: 1 HTN History: 1 Diabetes History: 0 Stroke History: 0 Vascular Disease History: 0 Age Score: 2 Gender Score: 0    Physical Exam:    VS:  BP 116/84   Pulse 79   Ht _0  (1.93 m)   Wt 211 lb 3.2 oz (95.8 kg)   SpO2 98%   BMI 25.71 kg/m     Wt Readings from Last 3 Encounters:  08/27/22 211 lb 3.2 oz (95.8 kg)  10/17/21 215 lb 12.8 oz (97.9 kg)  09/11/21 212 lb 2 oz (96.2 kg)     GEN:  Well nourished, well developed in no acute distress HEENT: Normal NECK: No JVD; No carotid bruits CARDIAC: Irregular RR, no murmurs, rubs, gallops RESPIRATORY:  Clear to auscultation without rales, wheezing or rhonchi  ABDOMEN: Soft, non-tender, non-distended MUSCULOSKELETAL:  No edema; No deformity. 2+ pedal pulses, equal bilaterally SKIN: Warm and dry NEUROLOGIC:  Alert and oriented x 3 PSYCHIATRIC:  Normal affect   EKG:  EKG is ordered today.  The ekg ordered today demonstrates atrial fibrillation with well-controlled ventricular rate at 79 bpm   Diagnoses:    1. Coronary artery disease involving native coronary artery of native heart without angina pectoris   2. Persistent atrial fibrillation ([place])   3. Hyperlipidemia LDL goal <70   4. Essential hypertension   5. Memory changes    Assessment and Plan:     Memory changes: Is concerned that some of his medications are affecting his memory. During exam, I had to repeat several  things at least 2 times. He also would forget what he was going to say to me mid thought.  Encouraged him to hold rosuvastatin until further notice.  Encouraged him to contact PCP for next steps regarding memory testing.  CAD without angina: Hx MI 2011 s/p DES to [facility], STEMI 2015 s/p DES to  prox LAD and RCA 2016,  and RCA, DES to LAD 07/2019. He denies chest pain, dyspnea, or other symptoms concerning for angina.  No indication for further ischemic evaluation at this time. I explained the reason that Plavix is needed in addition to Eliquis.  Medication management: I reviewed each medication verbally with patient and wife as well as wrote out the reason for taking on his paperwork.  I have encouraged him to reach out with additional questions to the prescribing provider.  Persistent atrial fibrillation on chronic anticoagulation: Atrial fibrillation with well-controlled ventricular rate on EKG today. Rate control strategy.  No bleeding concerns on Eliquis. Continue diltiazem, metoprolol, and Eliquis.     Hypertension: Blood pressure is well-controlled.  Hyperlipidemia LDL goal < 70: LDL 46 on 10/14/21.  He is concerned that rosuvastatin and Zetia are causing memory loss.  I have encouraged him to hold rosuvastatin until further notice. We will recheck fasting lipids prior to next office visit.  Continue ezetimibe.  Disposition:  3 months with Dr. Acie Fredrickson  Medication Adjustments/Labs and Tests Ordered: Current medicines are reviewed at length with the patient today.  Concerns regarding medicines are outlined above.  Orders Placed This Encounter  Procedures   Lipid Profile   Comp Met (CMET)   EKG 12-Lead   No orders of the defined types were placed in this encounter.   Patient Instructions  Medication Instructions:   DISCONTINUE Crestor.  *If you need a refill on your cardiac medications before your next appointment, please call your pharmacy*   Lab Work:  Your physician recommends that  you return for a FASTING lipid profile/cmet. On Wednesday, January 10. You can come in on the day of your appointment anytime between 7:30-4:30 fasting from midnight the night before.     If you have labs (blood work) drawn today and your tests are completely normal, you will receive your results only by: [place] (if you have MyChart) OR A paper copy in the mail If you have any lab test that is abnormal or we need to change your treatment, we will call you to review the results.   Testing/Procedures:   None ordered.   Follow-Up: At [facility], you and your health needs are our priority.  As part of our continuing mission to provide you with exceptional heart care, we have created designated Provider Care Teams.  These Care Teams include your primary Cardiologist (physician) and Advanced Practice Providers (APPs -  Physician Assistants and Nurse Practitioners) who all work together to provide you with the care you need, when you need it.  We recommend signing up for the patient portal called "MyChart".  Sign up information is provided on this After Visit Summary.  MyChart is used to connect with patients for Virtual Visits (Telemedicine).  Patients are able to view lab/test results, encounter notes, upcoming appointments, etc.  Non-urgent messages can be sent to your provider as well.   To learn more about what you can do with MyChart, go to [email or web address].    Your next appointment:   3 month(s)  The format for your next appointment:   In Person  Provider:   Mertie Moores, MD     Important Information About Sugar         Signed, Brian Life, [name]  08/27/2022 2:51 PM    [place]

## 2022-08-27 ENCOUNTER — Ambulatory Visit: Payer: Medicare HMO | Attending: Nurse Practitioner | Admitting: Nurse Practitioner

## 2022-08-27 ENCOUNTER — Encounter: Payer: Self-pay | Admitting: Nurse Practitioner

## 2022-08-27 VITALS — BP 116/84 | HR 79 | Ht 76.0 in | Wt 211.2 lb

## 2022-08-27 DIAGNOSIS — I4819 Other persistent atrial fibrillation: Secondary | ICD-10-CM | POA: Diagnosis not present

## 2022-08-27 DIAGNOSIS — R413 Other amnesia: Secondary | ICD-10-CM

## 2022-08-27 DIAGNOSIS — I10 Essential (primary) hypertension: Secondary | ICD-10-CM

## 2022-08-27 DIAGNOSIS — E785 Hyperlipidemia, unspecified: Secondary | ICD-10-CM

## 2022-08-27 DIAGNOSIS — I2510 Atherosclerotic heart disease of native coronary artery without angina pectoris: Secondary | ICD-10-CM | POA: Diagnosis not present

## 2022-08-27 NOTE — Patient Instructions (Signed)
Medication Instructions:   DISCONTINUE Crestor.  *If you need a refill on your cardiac medications before your next appointment, please call your pharmacy*   Lab Work:  Your physician recommends that you return for a FASTING lipid profile/cmet. On Wednesday, January 10. You can come in on the day of your appointment anytime between 7:30-4:30 fasting from midnight the night before.     If you have labs (blood work) drawn today and your tests are completely normal, you will receive your results only by: [place] (if you have MyChart) OR A paper copy in the mail If you have any lab test that is abnormal or we need to change your treatment, we will call you to review the results.   Testing/Procedures:   None ordered.   Follow-Up: At [facility], you and your health needs are our priority.  As part of our continuing mission to provide you with exceptional heart care, we have created designated Provider Care Teams.  These Care Teams include your primary Cardiologist (physician) and Advanced Practice Providers (APPs -  Physician Assistants and Nurse Practitioners) who all work together to provide you with the care you need, when you need it.  We recommend signing up for the patient portal called "MyChart".  Sign up information is provided on this After Visit Summary.  MyChart is used to connect with patients for Virtual Visits (Telemedicine).  Patients are able to view lab/test results, encounter notes, upcoming appointments, etc.  Non-urgent messages can be sent to your provider as well.   To learn more about what you can do with MyChart, go to [email or web address].    Your next appointment:   3 month(s)  The format for your next appointment:   In Person  Provider:   Mertie Moores, MD     Important Information About Sugar

## 2022-09-09 DIAGNOSIS — Z8673 Personal history of transient ischemic attack (TIA), and cerebral infarction without residual deficits: Secondary | ICD-10-CM | POA: Diagnosis not present

## 2022-09-09 DIAGNOSIS — I252 Old myocardial infarction: Secondary | ICD-10-CM | POA: Diagnosis not present

## 2022-09-09 DIAGNOSIS — N529 Male erectile dysfunction, unspecified: Secondary | ICD-10-CM | POA: Diagnosis not present

## 2022-09-09 DIAGNOSIS — I4891 Unspecified atrial fibrillation: Secondary | ICD-10-CM | POA: Diagnosis not present

## 2022-09-09 DIAGNOSIS — D6869 Other thrombophilia: Secondary | ICD-10-CM | POA: Diagnosis not present

## 2022-09-09 DIAGNOSIS — M109 Gout, unspecified: Secondary | ICD-10-CM | POA: Diagnosis not present

## 2022-09-09 DIAGNOSIS — Z8249 Family history of ischemic heart disease and other diseases of the circulatory system: Secondary | ICD-10-CM | POA: Diagnosis not present

## 2022-09-09 DIAGNOSIS — Z7902 Long term (current) use of antithrombotics/antiplatelets: Secondary | ICD-10-CM | POA: Diagnosis not present

## 2022-09-09 DIAGNOSIS — N189 Chronic kidney disease, unspecified: Secondary | ICD-10-CM | POA: Diagnosis not present

## 2022-09-09 DIAGNOSIS — I129 Hypertensive chronic kidney disease with stage 1 through stage 4 chronic kidney disease, or unspecified chronic kidney disease: Secondary | ICD-10-CM | POA: Diagnosis not present

## 2022-09-09 DIAGNOSIS — Z008 Encounter for other general examination: Secondary | ICD-10-CM | POA: Diagnosis not present

## 2022-09-09 DIAGNOSIS — I2510 Atherosclerotic heart disease of native coronary artery without angina pectoris: Secondary | ICD-10-CM | POA: Diagnosis not present

## 2022-09-09 DIAGNOSIS — E785 Hyperlipidemia, unspecified: Secondary | ICD-10-CM | POA: Diagnosis not present

## 2022-10-02 ENCOUNTER — Other Ambulatory Visit: Payer: Self-pay | Admitting: Internal Medicine

## 2022-10-15 ENCOUNTER — Ambulatory Visit: Payer: Medicare HMO | Attending: Cardiovascular Disease

## 2022-10-15 DIAGNOSIS — I4819 Other persistent atrial fibrillation: Secondary | ICD-10-CM

## 2022-10-15 DIAGNOSIS — I10 Essential (primary) hypertension: Secondary | ICD-10-CM | POA: Diagnosis not present

## 2022-10-15 DIAGNOSIS — E785 Hyperlipidemia, unspecified: Secondary | ICD-10-CM | POA: Diagnosis not present

## 2022-10-15 DIAGNOSIS — I2510 Atherosclerotic heart disease of native coronary artery without angina pectoris: Secondary | ICD-10-CM

## 2022-10-15 LAB — COMPREHENSIVE METABOLIC PANEL
ALT: 21 IU/L (ref 0–44)
AST: 22 IU/L (ref 0–40)
Albumin/Globulin Ratio: 2 (ref 1.2–2.2)
Albumin: 4.3 g/dL (ref 3.8–4.8)
Alkaline Phosphatase: 54 IU/L (ref 44–121)
BUN/Creatinine Ratio: 9 — ABNORMAL LOW (ref 10–24)
BUN: 16 mg/dL (ref 8–27)
Bilirubin Total: 0.5 mg/dL (ref 0.0–1.2)
CO2: 25 mmol/L (ref 20–29)
Calcium: 9.4 mg/dL (ref 8.6–10.2)
Chloride: 103 mmol/L (ref 96–106)
Creatinine, Ser: 1.73 mg/dL — ABNORMAL HIGH (ref 0.76–1.27)
Globulin, Total: 2.1 g/dL (ref 1.5–4.5)
Glucose: 104 mg/dL — ABNORMAL HIGH (ref 70–99)
Potassium: 3.9 mmol/L (ref 3.5–5.2)
Sodium: 141 mmol/L (ref 134–144)
Total Protein: 6.4 g/dL (ref 6.0–8.5)
eGFR: 40 mL/min/{1.73_m2} — ABNORMAL LOW (ref >59)

## 2022-10-15 LAB — LIPID PANEL
Chol/HDL Ratio: 4.1 ratio (ref 0.0–5.0)
Cholesterol, Total: 190 mg/dL (ref 100–199)
HDL: 46 mg/dL (ref >39)
LDL Chol Calc (NIH): 126 mg/dL — ABNORMAL HIGH (ref 0–99)
Triglycerides: 99 mg/dL (ref 0–149)
VLDL Cholesterol Cal: 18 mg/dL (ref 5–40)

## 2022-10-17 ENCOUNTER — Telehealth: Payer: Self-pay | Admitting: Internal Medicine

## 2022-10-17 ENCOUNTER — Other Ambulatory Visit: Payer: Self-pay | Admitting: Internal Medicine

## 2022-10-17 ENCOUNTER — Telehealth: Payer: Self-pay | Admitting: *Deleted

## 2022-10-17 DIAGNOSIS — Z79899 Other long term (current) drug therapy: Secondary | ICD-10-CM

## 2022-10-17 MED ORDER — ALLOPURINOL 100 MG PO TABS: 100.0000 mg | ORAL_TABLET | Freq: Every day | ORAL | 0 refills | Status: DC

## 2022-10-17 MED ORDER — ROSUVASTATIN CALCIUM 20 MG PO TABS: 20.0000 mg | ORAL_TABLET | Freq: Every day | ORAL | 3 refills | Status: DC

## 2022-10-17 NOTE — Telephone Encounter (Signed)
Caller & Relationship to patient: self   Call back number:[phone]   Date of last office visit: 07/2022   Date of next office visit:   Medication(s) to be refilled:  Allopurinol        [facility] [address]

## 2022-10-17 NOTE — Telephone Encounter (Signed)
Sent in 30 day supply -- has not been seen in over a year - need f/u or CPE

## 2022-10-17 NOTE — Telephone Encounter (Signed)
Spoke with patient and physical scheduled.

## 2022-10-17 NOTE — Telephone Encounter (Signed)
-----  Message from Emmaline Life, NP sent at 10/16/2022  6:03 AM EST ----- LDL cholesterol is high since stopping Crestor. Has there been any improvement in memory since stopping it? Is he still taking ezetimibe? Would recommend that he resume Crestor 20 mg daily unless he has had improvement in memory since being off of it. Recommend recheck lipid panel/ALT in 2-3 months.

## 2022-10-19 ENCOUNTER — Encounter: Payer: Self-pay | Admitting: Internal Medicine

## 2022-10-19 NOTE — Progress Notes (Unsigned)
Subjective:    Patient ID: [name], male    DOB: [birth year], 77 y.o.   MRN: [id number]     HPI Dametri is here for a physical exam.   Needs a1c   Medications and allergies reviewed with patient and updated if appropriate.  Current Outpatient Medications on File Prior to Visit  Medication Sig Dispense Refill   rosuvastatin (CRESTOR) 20 MG tablet Take 1 tablet (20 mg total) by mouth daily. 90 tablet 3   allopurinol (ZYLOPRIM) 100 MG tablet Take 1 tablet (100 mg total) by mouth daily. 30 tablet 0   calcium carbonate (TUMS - DOSED IN MG ELEMENTAL CALCIUM) 500 MG chewable tablet Chew 2 tablets by mouth daily as needed for indigestion or heartburn.      clopidogrel (PLAVIX) 75 MG tablet TAKE 1 TABLET BY MOUTH EVERY DAY WITH BREAKFAST 90 tablet 1   Cyanocobalamin (VITAMIN B12 PO) Take 1 tablet by mouth every evening.     diltiazem (CARDIZEM CD) 240 MG 24 hr capsule TAKE 1 CAPSULE BY MOUTH EVERY DAY 90 capsule 3   ELIQUIS 5 MG TABS tablet TAKE 1 TABLET BY MOUTH TWICE A DAY 180 tablet 1   ezetimibe (ZETIA) 10 MG tablet TAKE 1 TABLET BY MOUTH EVERY DAY 90 tablet 3   fish oil-omega-3 fatty acids 1000 MG capsule Take 2 g by mouth 2 (two) times daily.       metoprolol tartrate (LOPRESSOR) 50 MG tablet TAKE 1 TABLET BY MOUTH TWICE A DAY 180 tablet 2   Misc Natural Products (TART CHERRY ADVANCED PO) Take 1,000 mg by mouth daily.     nitroGLYCERIN (NITROSTAT) 0.4 MG SL tablet Place 1 tablet (0.4 mg total) under the tongue every 5 (five) minutes x 3 doses as needed for chest pain. 25 tablet 4   Potassium Gluconate 2 MEQ TABS Take 1 tablet by mouth daily.     No current facility-administered medications on file prior to visit.    Review of Systems     Objective:  There were no vitals filed for this visit. There were no vitals filed for this visit. There is no height or weight on file to calculate BMI.  BP Readings from Last 3 Encounters:  08/27/22 116/84  10/17/21 122/70  09/11/21  132/74    Wt Readings from Last 3 Encounters:  08/27/22 211 lb 3.2 oz (95.8 kg)  10/17/21 215 lb 12.8 oz (97.9 kg)  09/11/21 212 lb 2 oz (96.2 kg)      Physical Exam Constitutional: He appears well-developed and well-nourished. No distress.  HENT:  Head: Normocephalic and atraumatic.  Right Ear: External ear normal.  Left Ear: External ear normal.  Mouth/Throat: Oropharynx is clear and moist.  Normal ear canals and TM b/l  Eyes: Conjunctivae and EOM are normal.  Neck: Neck supple. No tracheal deviation present. No thyromegaly present.  No carotid bruit  Cardiovascular: Normal rate, regular rhythm, normal heart sounds and intact distal pulses.   No murmur heard. Pulmonary/Chest: Effort normal and breath sounds normal. No respiratory distress. He has no wheezes. He has no rales.  Abdominal: Soft. He exhibits no distension. There is no tenderness.  Genitourinary: deferred  Musculoskeletal: He exhibits no edema.  Lymphadenopathy:   He has no cervical adenopathy.  Skin: Skin is warm and dry. He is not diaphoretic.  Psychiatric: He has a normal mood and affect. His behavior is normal.         Assessment & Plan:   Physical  exam: Screening blood work  ordered Exercise    Weight   Substance abuse   none   Reviewed recommended immunizations.   Health Maintenance  Topic Date Due   Zoster Vaccines- Shingrix (1 of 2) Never done   Pneumonia Vaccine 40+ Years old (1 - PCV) Never done   COVID-19 Vaccine (3 - Pfizer risk series) 01/31/2020   DTaP/Tdap/Td (2 - Tdap) 06/14/2020   INFLUENZA VACCINE  Never done   COLONOSCOPY (Pts 45-52yr Insurance coverage will need to be confirmed)  10/27/2022   Medicare Annual Wellness (AWV)  03/22/2023   Hepatitis C Screening  Completed   HPV VACCINES  Aged Out     See Problem List for Assessment and Plan of chronic medical problems.

## 2022-10-19 NOTE — Patient Instructions (Addendum)
Blood work was ordered.   The lab is on the first floor.    Medications changes include :   None     Return in about 1 year (around 10/22/2023) for Physical Exam.    Health Maintenance, Male Adopting a healthy lifestyle and getting preventive care are important in promoting health and wellness. Ask your health care provider about: The right schedule for you to have regular tests and exams. Things you can do on your own to prevent diseases and keep yourself healthy. What should I know about diet, weight, and exercise? Eat a healthy diet  Eat a diet that includes plenty of vegetables, fruits, low-fat dairy products, and lean protein. Do not eat a lot of foods that are high in solid fats, added sugars, or sodium. Maintain a healthy weight Body mass index (BMI) is a measurement that can be used to identify possible weight problems. It estimates body fat based on height and weight. Your health care provider can help determine your BMI and help you achieve or maintain a healthy weight. Get regular exercise Get regular exercise. This is one of the most important things you can do for your health. Most adults should: Exercise for at least 150 minutes each week. The exercise should increase your heart rate and make you sweat (moderate-intensity exercise). Do strengthening exercises at least twice a week. This is in addition to the moderate-intensity exercise. Spend less time sitting. Even light physical activity can be beneficial. Watch cholesterol and blood lipids Have your blood tested for lipids and cholesterol at 79 years of age, then have this test every 5 years. You may need to have your cholesterol levels checked more often if: Your lipid or cholesterol levels are high. You are older than 79 years of age. You are at high risk for heart disease. What should I know about cancer screening? Many types of cancers can be detected early and may often be prevented. Depending on  your health history and family history, you may need to have cancer screening at various ages. This may include screening for: Colorectal cancer. Prostate cancer. Skin cancer. Lung cancer. What should I know about heart disease, diabetes, and high blood pressure? Blood pressure and heart disease High blood pressure causes heart disease and increases the risk of stroke. This is more likely to develop in people who have high blood pressure readings or are overweight. Talk with your health care provider about your target blood pressure readings. Have your blood pressure checked: Every 3-5 years if you are 39-65 years of age. Every year if you are 70 years old or older. If you are between the ages of 19 and 14 and are a current or former smoker, ask your health care provider if you should have a one-time screening for abdominal aortic aneurysm (AAA). Diabetes Have regular diabetes screenings. This checks your fasting blood sugar level. Have the screening done: Once every three years after age 71 if you are at a normal weight and have a low risk for diabetes. More often and at a younger age if you are overweight or have a high risk for diabetes. What should I know about preventing infection? Hepatitis B If you have a higher risk for hepatitis B, you should be screened for this virus. Talk with your health care provider to find out if you are at risk for hepatitis B infection. Hepatitis C Blood testing is recommended for: Everyone born from [birth year] through 1967. Anyone with known  risk factors for hepatitis C. Sexually transmitted infections (STIs) You should be screened each year for STIs, including gonorrhea and chlamydia, if: You are sexually active and are younger than 79 years of age. You are older than 79 years of age and your health care provider tells you that you are at risk for this type of infection. Your sexual activity has changed since you were last screened, and you are at increased  risk for chlamydia or gonorrhea. Ask your health care provider if you are at risk. Ask your health care provider about whether you are at high risk for HIV. Your health care provider may recommend a prescription medicine to help prevent HIV infection. If you choose to take medicine to prevent HIV, you should first get tested for HIV. You should then be tested every 3 months for as long as you are taking the medicine. Follow these instructions at home: Alcohol use Do not drink alcohol if your health care provider tells you not to drink. If you drink alcohol: Limit how much you have to 0-2 drinks a day. Know how much alcohol is in your drink. In the [place] one drink equals one 12 oz bottle of beer (355 mL), one 5 oz glass of wine (148 mL), or one 1 oz glass of hard liquor (44 mL). Lifestyle Do not use any products that contain nicotine or tobacco. These products include cigarettes, chewing tobacco, and vaping devices, such as e-cigarettes. If you need help quitting, ask your health care provider. Do not use street drugs. Do not share needles. Ask your health care provider for help if you need support or information about quitting drugs. General instructions Schedule regular health, dental, and eye exams. Stay current with your vaccines. Tell your health care provider if: You often feel depressed. You have ever been abused or do not feel safe at home. Summary Adopting a healthy lifestyle and getting preventive care are important in promoting health and wellness. Follow your health care provider's instructions about healthy diet, exercising, and getting tested or screened for diseases. Follow your health care provider's instructions on monitoring your cholesterol and blood pressure. This information is not intended to replace advice given to you by your health care provider. Make sure you discuss any questions you have with your health care provider. Document Revised: 02/11/2021 Document  Reviewed: 02/11/2021 Elsevier Patient Education  [place].

## 2022-10-21 ENCOUNTER — Ambulatory Visit (INDEPENDENT_AMBULATORY_CARE_PROVIDER_SITE_OTHER): Payer: Medicare HMO | Admitting: Internal Medicine

## 2022-10-21 VITALS — BP 122/78 | HR 89 | Temp 98.2°F | Ht 76.0 in | Wt 215.0 lb

## 2022-10-21 DIAGNOSIS — I4819 Other persistent atrial fibrillation: Secondary | ICD-10-CM | POA: Diagnosis not present

## 2022-10-21 DIAGNOSIS — I2510 Atherosclerotic heart disease of native coronary artery without angina pectoris: Secondary | ICD-10-CM

## 2022-10-21 DIAGNOSIS — E785 Hyperlipidemia, unspecified: Secondary | ICD-10-CM

## 2022-10-21 DIAGNOSIS — I10 Essential (primary) hypertension: Secondary | ICD-10-CM

## 2022-10-21 DIAGNOSIS — M109 Gout, unspecified: Secondary | ICD-10-CM

## 2022-10-21 DIAGNOSIS — Z85820 Personal history of malignant melanoma of skin: Secondary | ICD-10-CM

## 2022-10-21 DIAGNOSIS — R7303 Prediabetes: Secondary | ICD-10-CM | POA: Diagnosis not present

## 2022-10-21 DIAGNOSIS — Z0000 Encounter for general adult medical examination without abnormal findings: Secondary | ICD-10-CM

## 2022-10-21 DIAGNOSIS — N1832 Chronic kidney disease, stage 3b: Secondary | ICD-10-CM

## 2022-10-21 DIAGNOSIS — G250 Essential tremor: Secondary | ICD-10-CM | POA: Diagnosis not present

## 2022-10-21 LAB — COMPREHENSIVE METABOLIC PANEL
ALT: 21 U/L (ref 0–53)
AST: 22 U/L (ref 0–37)
Albumin: 4.5 g/dL (ref 3.5–5.2)
Alkaline Phosphatase: 45 U/L (ref 39–117)
BUN: 18 mg/dL (ref 6–23)
CO2: 29 mEq/L (ref 19–32)
Calcium: 9.9 mg/dL (ref 8.4–10.5)
Chloride: 101 mEq/L (ref 96–112)
Creatinine, Ser: 1.77 mg/dL — ABNORMAL HIGH (ref 0.40–1.50)
GFR: 36.34 mL/min — ABNORMAL LOW (ref >60.00)
Glucose, Bld: 94 mg/dL (ref 70–99)
Potassium: 4.1 mEq/L (ref 3.5–5.1)
Sodium: 139 mEq/L (ref 135–145)
Total Bilirubin: 0.7 mg/dL (ref 0.2–1.2)
Total Protein: 6.9 g/dL (ref 6.0–8.3)

## 2022-10-21 LAB — CBC WITH DIFFERENTIAL/PLATELET
Basophils Absolute: 0 10*3/uL (ref 0.0–0.1)
Basophils Relative: 0.5 % (ref 0.0–3.0)
Eosinophils Absolute: 0.1 10*3/uL (ref 0.0–0.7)
Eosinophils Relative: 2.5 % (ref 0.0–5.0)
HCT: 42.7 % (ref 39.0–52.0)
Hemoglobin: 14.9 g/dL (ref 13.0–17.0)
Lymphocytes Relative: 29.5 % (ref 12.0–46.0)
Lymphs Abs: 1.4 10*3/uL (ref 0.7–4.0)
MCHC: 34.9 g/dL (ref 30.0–36.0)
MCV: 83.1 fl (ref 78.0–100.0)
Monocytes Absolute: 0.3 10*3/uL (ref 0.1–1.0)
Monocytes Relative: 6.8 % (ref 3.0–12.0)
Neutro Abs: 2.8 10*3/uL (ref 1.4–7.7)
Neutrophils Relative %: 60.7 % (ref 43.0–77.0)
Platelets: 125 10*3/uL — ABNORMAL LOW (ref 150.0–400.0)
RBC: 5.14 Mil/uL (ref 4.22–5.81)
RDW: 14.6 % (ref 11.5–15.5)
WBC: 4.6 10*3/uL (ref 4.0–10.5)

## 2022-10-21 LAB — TSH: TSH: 4.3 u[IU]/mL (ref 0.35–5.50)

## 2022-10-21 LAB — HEMOGLOBIN A1C: Hgb A1c MFr Bld: 5.7 % (ref 4.6–6.5)

## 2022-10-21 LAB — URIC ACID: Uric Acid, Serum: 6.4 mg/dL (ref 4.0–7.8)

## 2022-10-21 MED ORDER — ALLOPURINOL 100 MG PO TABS: 100.0000 mg | ORAL_TABLET | Freq: Every day | ORAL | 3 refills | Status: DC

## 2022-10-21 NOTE — Assessment & Plan Note (Signed)
History of melanoma Sees dermatology annually

## 2022-10-21 NOTE — Assessment & Plan Note (Signed)
Chronic Check a1c Low sugar / carb diet Stressed regular exercise   

## 2022-10-21 NOTE — Assessment & Plan Note (Signed)
Chronic Following with cardiology No symptoms consistent with angina On Plavix 75 mg daily, Eliquis 5 mg twice daily, diltiazem to 40 mg daily, metoprolol 50 mg twice daily, Zetia 10 mg daily, Crestor 20 mg daily

## 2022-10-21 NOTE — Assessment & Plan Note (Addendum)
Chronic Stage IIIa Kidney function has remained stable Advised increased water intake, avoiding NSAIDs Stressed good blood pressure, sugar control Advised low-sodium diet CBC, CMP

## 2022-10-21 NOTE — Assessment & Plan Note (Signed)
Chronic Blood pressure well controlled CMP Continue metoprolol 50 mg twice daily, diltiazem to 40 mg daily

## 2022-10-21 NOTE — Assessment & Plan Note (Addendum)
Chronic Regular exercise and healthy diet encouraged Lipids checked by cardiology-was holding Crestor at that time and because of the increase in LDL was advised to go back on it-they plan on rechecking lipids in March Continue Crestor 20 mg daily, Zetia 10 mg daily

## 2022-10-21 NOTE — Assessment & Plan Note (Addendum)
Chronic No active gout symptoms Had gout flare a few months ago Typically gout is controlled Continue allopurinol 100 mg daily for prevention Check uric acid level

## 2022-10-21 NOTE — Assessment & Plan Note (Signed)
Chronic Makes writing difficult, sometimes eating Intermittent Was referred to neuro in the past - did not go - deferred for now

## 2022-10-21 NOTE — Assessment & Plan Note (Signed)
Chronic Following with cardiology On Eliquis 5 mg twice daily, metoprolol 50 mg twice daily, diltiazem to 40 mg daily

## 2022-11-02 ENCOUNTER — Other Ambulatory Visit: Payer: Self-pay | Admitting: Cardiovascular Disease

## 2022-11-09 ENCOUNTER — Encounter: Payer: Self-pay | Admitting: Cardiovascular Disease

## 2022-11-09 NOTE — Progress Notes (Unsigned)
Cardiology Office Note:    Date:  11/12/2022   ID:  [name], DOB March 19, 1946, MRN [id number]  PCP:  Binnie Rail, MD  Cardiologist:  Mertie Moores, MD  Electrophysiologist:  None   Referring MD: Binnie Rail, MD   Chief Complaint  Patient presents with   Coronary Artery Disease        Atrial Fibrillation    Previous notes.    [name] is a 77 y.o. male with a hx of history of coronary artery disease.  He status post non-ST segment elevation myocardial infarction in 2011.  He status post DES to the RCA, status post DES to the LAD in 2015, status post DES to the RCA in 2016.  He has mildly reduced left ventricular systolic function by echo in October, 2020.  He also has a history of hypertension, hyperlipidemia, CKD, pulmonary embolus, persistent atrial fibrillation and is on Eliquis.  This is the first time meeting him.   No cp.  Is active , does lots of yard work .   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, 2021: [name] is seen today for follow-up.  He has a history of coronary artery disease.  Status post stenting of his right coronary artery and his left and descending artery.  He has mildly reduced left ventricular systolic function.  He also has a history of hypertension, hyperlipidemia, CKD, pulmonary embolus and persistent atrial fibrillation.  He is on Eliquis.  Saw Scott weaver in early august.  Had some tunnel  Vision,  Thought to have a TIA  CT of the head did not reveal any specific abnormality.  Brain MRI did not reveal any evidence of stroke or TIA.  It revealed only small vessel changes.  TSH was mildly elevated.   I asked him to consult with his medical doctor for further advice on that.    Jan. 12, 2023 [name] is seen today for follow up of his CAD, s/p stenting of his RCA and LAD  Has mildly reduced LV function . Hx of HTN, HLD, CKD , hx of PE   No angina , no dyspnea Getting some regular exercise   Had COVID in  Aug., 2022 Is better .   Nov 12, 2022 [name] is seen today for follow up of his CAD, s/p stenting of his LAD and RCA  Has mildly reduced LV function  He was diagnosed with atrial fibrillation several years ago   He has been on Eliquis. He still in A-fib today.  He is tolerating Eliquis quite well No CP  Mild DOE if he exerts himself  Echo shows normal LV function   Past Medical History:  Diagnosis Date   Anemia, mild    CAD (coronary artery disease)    a. 02/2010 NSTEMI/PCI: PROMUS DES to mRCA. b. 06/05/14 Cath/PCI: pLAD 70%--> s/p PCI/DES (Promus DES);  c. 01/2015 Cath/PCI: LM nl, LAD 35m patent prox stent, LCX patent prox stent, RCA 30p, [id number] Synergy DES). // Myoview 07/2019:  EF 4860atten artifact, no ischemia, intermediate risk (low EF)    CKD (chronic kidney disease), stage III (HCC)    Diastolic dysfunction    a. Echo 6/11: mild LVH, EF 55-60%, GR1DD, Trivial MR, mild LAE. b. echo 06/04/14: EF 55-60%, no WMA, GR1DD, Ao valve mildly calcifed, mild MR, mild LAE   History of pulmonary embolism    Hyperlipidemia    Hypertension    did not tolerate  Lisinopril event at low dose   Melanoma ([place])    a. 1987. b. 2012   Myocardial infarction ([place])    Persistent atrial fibrillation ([place]) 12/03/2018   Dx 11/2018 >> Apixaban started // Echo 12/2018: EF 55-60, mod RVE, normal RVSF, mild LAE, trivial MR, mild TR, mod AoV sclerosis    Prostate cancer ([place])    Dr Karsten Ro   Stroke [facility])    TIA (transient ischemic attack)    a. post cath and PCI on 06/05/2014 - no residual sequelae. // ?recurrent TIA 8/21 >> Carotid US 8/21: normal  // Echocardiogram 8/21: EF 55-60, no RWMA, normal RVSF, mild BAE, trivial MR, trivial AI    Vitamin B12 deficiency     Past Surgical History:  Procedure Laterality Date   CHOLECYSTECTOMY     COLONOSCOPY  2006   CORONARY STENT INTERVENTION N/A 08/05/2019   Procedure: CORONARY STENT INTERVENTION;  Surgeon: Jettie Booze, MD;  Location: [place] CV LAB;   Service: Cardiovascular;  Laterality: N/A;   KNEE ARTHROSCOPY     right   LEFT HEART CATH AND CORONARY ANGIOGRAPHY N/A 08/05/2019   Procedure: LEFT HEART CATH AND CORONARY ANGIOGRAPHY;  Surgeon: Jettie Booze, MD;  Location: [place] CV LAB;  Service: Cardiovascular;  Laterality: N/A;   LEFT HEART CATHETERIZATION WITH CORONARY ANGIOGRAM N/A 06/05/2014   Procedure: LEFT HEART CATHETERIZATION WITH CORONARY ANGIOGRAM;  Surgeon: Blane Ohara, MD;  Location[facility] C[facility] LAB;  Service: Cardiovascular;  Laterality: N/A;   LEFT HEART CATHETERIZATION WITH CORONARY ANGIOGRAM N/A 01/19/2015   Procedure: LEFT HEART CATHETERIZATION WITH CORONARY ANGIOGRAM;  Surgeon: Peter M Martinique, MD;  Location: [facility] CATH LAB;  Service: Cardiovascular;  Laterality: N/A;   MELANOMA EXCISION  2011    L chest   MELANOMA EXCISION  1991   back   mRCA stent  02/2010   PROSTATECTOMY     Dr. Karsten Ro   TONSILLECTOMY      Current Medications: Current Meds  Medication Sig   allopurinol (ZYLOPRIM) 100 MG tablet Take 1 tablet (100 mg total) by mouth daily.   calcium carbonate (TUMS - DOSED IN MG ELEMENTAL CALCIUM) 500 MG chewable tablet Chew 2 tablets by mouth daily as needed for indigestion or heartburn.    clopidogrel (PLAVIX) 75 MG tablet TAKE 1 TABLET BY MOUTH EVERY DAY WITH BREAKFAST   Cyanocobalamin (VITAMIN B12 PO) Take 1 tablet by mouth every evening.   diltiazem (CARDIZEM CD) 240 MG 24 hr capsule TAKE 1 CAPSULE BY MOUTH EVERY DAY   ELIQUIS 5 MG TABS tablet TAKE 1 TABLET BY MOUTH TWICE A DAY   ezetimibe (ZETIA) 10 MG tablet TAKE 1 TABLET BY MOUTH EVERY DAY   fish oil-omega-3 fatty acids 1000 MG capsule Take 2 g by mouth 2 (two) times daily.     metoprolol tartrate (LOPRESSOR) 50 MG tablet TAKE 1 TABLET BY MOUTH TWICE A DAY   Misc Natural Products (TART CHERRY ADVANCED PO) Take 1,000 mg by mouth daily.   nitroGLYCERIN (NITROSTAT) 0.4 MG SL tablet Place 1 tablet (0.4 mg total) under the tongue every 5 (five)  minutes x 3 doses as needed for chest pain.   Potassium Gluconate 2 MEQ TABS Take 1 tablet by mouth daily.   rosuvastatin (CRESTOR) 20 MG tablet Take 1 tablet (20 mg total) by mouth daily.     Allergies:   Nitroglycerin and Lidocaine   Social History   Socioeconomic History   Marital status: Married    Spouse name: Not  on file   Number of children: Not on file   Years of education: Not on file   Highest education level: Not on file  Occupational History   Not on file  Tobacco Use   Smoking status: Never   Smokeless tobacco: Never  Vaping Use   Vaping Use: Never used  Substance and Sexual Activity   Alcohol use: No    Alcohol/week: 0.0 standard drinks of alcohol   Drug use: No   Sexual activity: Not on file  Other Topics Concern   Not on file  Social History Narrative   Right handed    Caffeine- 2-3 cups per day    Social Determinants of Health   Financial Resource Strain: Low Risk  (03/21/2022)   Overall Financial Resource Strain (CARDIA)    Difficulty of Paying Living Expenses: Not hard at all  Food Insecurity: No Food Insecurity (03/21/2022)   Hunger Vital Sign    Worried About Running Out of Food in the Last Year: Never true    Ran Out of Food in the Last Year: Never true  Transportation Needs: No Transportation Needs (03/21/2022)   PRAPARE - Hydrologist (Medical): No    Lack of Transportation (Non-Medical): No  Physical Activity: Insufficiently Active (03/21/2022)   Exercise Vital Sign    Days of Exercise per Week: 3 days    Minutes of Exercise per Session: 30 min  Stress: No Stress Concern Present (03/21/2022)   [place]    Feeling of Stress : Not at all  Social Connections: Moderately Isolated (03/21/2022)   Social Connection and Isolation Panel [NHANES]    Frequency of Communication with Friends and Family: Three times a week    Frequency of Social Gatherings with  Friends and Family: Three times a week    Attends Religious Services: Never    Active Member of Clubs or Organizations: No    Attends Archivist Meetings: Never    Marital Status: Married     Family History: The patient's family history includes Coronary artery disease in his mother; Diabetes in his sister; Heart disease in his mother; Hypertension in his brother; Prostate cancer in his father; Transient ischemic attack (age of onset: 53) in his mother.  ROS:   Please see the history of present illness.     All other systems reviewed and are negative.  EKGs/Labs/Other Studies Reviewed:    The following studies were reviewed today:    EKG:   November 12, 2022: Atrial fibrillation with a heart rate of 73.  Otherwise normal EKG.  Recent Labs: 10/21/2022: ALT 21; BUN 18; Creatinine, Ser 1.77; Hemoglobin 14.9; Platelets 125.0; Potassium 4.1; Sodium 139; TSH 4.30  Recent Lipid Panel    Component Value Date/Time   CHOL 190 10/15/2022 0838   CHOL 122 (L) 02/07/2016 0745   TRIG 99 10/15/2022 0838   TRIG 113 02/07/2016 0745   TRIG 220 (HH) 09/07/2006 1105   HDL 46 10/15/2022 0838   HDL 38 (L) 02/07/2016 0745   CHOLHDL 4.1 10/15/2022 0838   CHOLHDL 3 09/14/2018 1130   VLDL 24.4 09/14/2018 1130   LDLCALC 126 (H) 10/15/2022 0838   LDLCALC 61 02/07/2016 0745   LDLDIRECT 68.8 07/20/2012 0837    Physical Exam:    Physical Exam: Blood pressure 128/76, pulse 73, height '6\' 4"'$  (1.93 m), weight 213 lb 6.4 oz (96.8 kg), SpO2 98 %.  GEN:  Well nourished, well developed in no acute distress HEENT: Normal NECK: No JVD; No carotid bruits LYMPHATICS: No lymphadenopathy CARDIAC: RRR , no murmurs, rubs, gallops RESPIRATORY:  Clear to auscultation without rales, wheezing or rhonchi  ABDOMEN: Soft, non-tender, non-distended MUSCULOSKELETAL:  No edema; No deformity  SKIN: Warm and dry NEUROLOGIC:  Alert and oriented x 3  ECG:  .  ASSESSMENT:    1. Persistent atrial  fibrillation ([place])   2. Coronary artery disease involving native coronary artery of native heart without angina pectoris      PLAN:     1.  Coronary artery disease:    s/p coronary stenting.   Is not having any angina     2.  Atrial fibrillation:   stable.  HR is well controlled.  Asymptomatic    3.  Hyperlipidemia-     4.  Chronic kidney disease:    Medication Adjustments/Labs and Tests Ordered: Current medicines are reviewed at length with the patient today.  Concerns regarding medicines are outlined above.    Orders Placed This Encounter  Procedures   EKG 12-Lead   No orders of the defined types were placed in this encounter.   Patient Instructions  Medication Instructions:  Your physician recommends that you continue on your current medications as directed. Please refer to the Current Medication list given to you today.  *If you need a refill on your cardiac medications before your next appointment, please call your pharmacy*   Lab Work: NONE If you have labs (blood work) drawn today and your tests are completely normal, you will receive your results only by: [place] (if you have MyChart) OR A paper copy in the mail If you have any lab test that is abnormal or we need to change your treatment, we will call you to review the results.   Testing/Procedures: NONE   Follow-Up: At [facility], you and your health needs are our priority.  As part of our continuing mission to provide you with exceptional heart care, we have created designated Provider Care Teams.  These Care Teams include your primary Cardiologist (physician) and Advanced Practice Providers (APPs -  Physician Assistants and Nurse Practitioners) who all work together to provide you with the care you need, when you need it.  We recommend signing up for the patient portal called "MyChart".  Sign up information is provided on this After Visit Summary.  MyChart is used to connect with  patients for Virtual Visits (Telemedicine).  Patients are able to view lab/test results, encounter notes, upcoming appointments, etc.  Non-urgent messages can be sent to your provider as well.   To learn more about what you can do with MyChart, go to [email or web address].    Your next appointment:   1 year(s)  Provider:   Mertie Moores, MD        Signed, Mertie Moores, MD  11/12/2022 5:58 PM    [place]

## 2022-11-12 ENCOUNTER — Encounter: Payer: Self-pay | Admitting: Cardiovascular Disease

## 2022-11-12 ENCOUNTER — Ambulatory Visit: Payer: Medicare HMO | Attending: Cardiovascular Disease | Admitting: Cardiovascular Disease

## 2022-11-12 VITALS — BP 128/76 | HR 73 | Ht 76.0 in | Wt 213.4 lb

## 2022-11-12 DIAGNOSIS — I2510 Atherosclerotic heart disease of native coronary artery without angina pectoris: Secondary | ICD-10-CM

## 2022-11-12 DIAGNOSIS — I4819 Other persistent atrial fibrillation: Secondary | ICD-10-CM | POA: Diagnosis not present

## 2022-11-12 NOTE — Patient Instructions (Signed)
Medication Instructions:  Your physician recommends that you continue on your current medications as directed. Please refer to the Current Medication list given to you today.  *If you need a refill on your cardiac medications before your next appointment, please call your pharmacy*   Lab Work: NONE If you have labs (blood work) drawn today and your tests are completely normal, you will receive your results only by: MyChart Message (if you have MyChart) OR A paper copy in the mail If you have any lab test that is abnormal or we need to change your treatment, we will call you to review the results.   Testing/Procedures: NONE   Follow-Up: At [facility], you and your health needs are our priority.  As part of our continuing mission to provide you with exceptional heart care, we have created designated Provider Care Teams.  These Care Teams include your primary Cardiologist (physician) and Advanced Practice Providers (APPs -  Physician Assistants and Nurse Practitioners) who all work together to provide you with the care you need, when you need it.  We recommend signing up for the patient portal called "MyChart".  Sign up information is provided on this After Visit Summary.  MyChart is used to connect with patients for Virtual Visits (Telemedicine).  Patients are able to view lab/test results, encounter notes, upcoming appointments, etc.  Non-urgent messages can be sent to your provider as well.   To learn more about what you can do with MyChart, go to [email or web address].    Your next appointment:   1 year(s)  Provider:   Philip Nahser, MD   

## 2022-11-26 ENCOUNTER — Encounter: Payer: Self-pay | Admitting: Gastroenterology

## 2022-12-29 ENCOUNTER — Other Ambulatory Visit: Payer: Self-pay | Admitting: Cardiovascular Disease

## 2022-12-30 DIAGNOSIS — E785 Hyperlipidemia, unspecified: Secondary | ICD-10-CM | POA: Diagnosis not present

## 2022-12-30 DIAGNOSIS — Z8673 Personal history of transient ischemic attack (TIA), and cerebral infarction without residual deficits: Secondary | ICD-10-CM | POA: Diagnosis not present

## 2022-12-30 DIAGNOSIS — Z823 Family history of stroke: Secondary | ICD-10-CM | POA: Diagnosis not present

## 2022-12-30 DIAGNOSIS — Z7902 Long term (current) use of antithrombotics/antiplatelets: Secondary | ICD-10-CM | POA: Diagnosis not present

## 2022-12-30 DIAGNOSIS — K219 Gastro-esophageal reflux disease without esophagitis: Secondary | ICD-10-CM | POA: Diagnosis not present

## 2022-12-30 DIAGNOSIS — N1832 Chronic kidney disease, stage 3b: Secondary | ICD-10-CM | POA: Diagnosis not present

## 2022-12-30 DIAGNOSIS — Z8249 Family history of ischemic heart disease and other diseases of the circulatory system: Secondary | ICD-10-CM | POA: Diagnosis not present

## 2022-12-30 DIAGNOSIS — K5900 Constipation, unspecified: Secondary | ICD-10-CM | POA: Diagnosis not present

## 2022-12-30 DIAGNOSIS — D6869 Other thrombophilia: Secondary | ICD-10-CM | POA: Diagnosis not present

## 2022-12-30 DIAGNOSIS — I129 Hypertensive chronic kidney disease with stage 1 through stage 4 chronic kidney disease, or unspecified chronic kidney disease: Secondary | ICD-10-CM | POA: Diagnosis not present

## 2022-12-30 DIAGNOSIS — I4891 Unspecified atrial fibrillation: Secondary | ICD-10-CM | POA: Diagnosis not present

## 2022-12-30 DIAGNOSIS — C61 Malignant neoplasm of prostate: Secondary | ICD-10-CM | POA: Diagnosis not present

## 2023-01-16 ENCOUNTER — Ambulatory Visit: Payer: Medicare HMO

## 2023-01-22 ENCOUNTER — Other Ambulatory Visit: Payer: Medicare HMO

## 2023-01-22 DIAGNOSIS — Z85820 Personal history of malignant melanoma of skin: Secondary | ICD-10-CM | POA: Diagnosis not present

## 2023-01-22 DIAGNOSIS — L821 Other seborrheic keratosis: Secondary | ICD-10-CM | POA: Diagnosis not present

## 2023-01-22 DIAGNOSIS — L812 Freckles: Secondary | ICD-10-CM | POA: Diagnosis not present

## 2023-01-22 DIAGNOSIS — L570 Actinic keratosis: Secondary | ICD-10-CM | POA: Diagnosis not present

## 2023-01-27 ENCOUNTER — Ambulatory Visit: Payer: Medicare HMO | Attending: Nurse Practitioner

## 2023-01-27 DIAGNOSIS — Z79899 Other long term (current) drug therapy: Secondary | ICD-10-CM | POA: Diagnosis not present

## 2023-01-28 LAB — COMPREHENSIVE METABOLIC PANEL
ALT: 30 IU/L (ref 0–44)
AST: 26 IU/L (ref 0–40)
Albumin/Globulin Ratio: 2.2 (ref 1.2–2.2)
Albumin: 4.3 g/dL (ref 3.8–4.8)
Alkaline Phosphatase: 63 IU/L (ref 44–121)
BUN/Creatinine Ratio: 8 — ABNORMAL LOW (ref 10–24)
BUN: 16 mg/dL (ref 8–27)
Bilirubin Total: 0.5 mg/dL (ref 0.0–1.2)
CO2: 21 mmol/L (ref 20–29)
Calcium: 9.2 mg/dL (ref 8.6–10.2)
Chloride: 105 mmol/L (ref 96–106)
Creatinine, Ser: 1.98 mg/dL — ABNORMAL HIGH (ref 0.76–1.27)
Globulin, Total: 2 g/dL (ref 1.5–4.5)
Glucose: 102 mg/dL — ABNORMAL HIGH (ref 70–99)
Potassium: 4.1 mmol/L (ref 3.5–5.2)
Sodium: 143 mmol/L (ref 134–144)
Total Protein: 6.3 g/dL (ref 6.0–8.5)
eGFR: 34 mL/min/{1.73_m2} — ABNORMAL LOW (ref >59)

## 2023-01-28 LAB — LIPID PANEL
Chol/HDL Ratio: 2.4 ratio (ref 0.0–5.0)
Cholesterol, Total: 98 mg/dL — ABNORMAL LOW (ref 100–199)
HDL: 41 mg/dL (ref >39)
LDL Chol Calc (NIH): 42 mg/dL (ref 0–99)
Triglycerides: 73 mg/dL (ref 0–149)
VLDL Cholesterol Cal: 15 mg/dL (ref 5–40)

## 2023-02-04 ENCOUNTER — Telehealth: Payer: Self-pay | Admitting: Internal Medicine

## 2023-02-04 NOTE — Telephone Encounter (Signed)
Contacted Donata Duff to schedule their annual wellness visit. Appointment made for 02/17/2022.  [facility] Care Guide [facility] TEAM Direct Dial: [phone]

## 2023-02-09 ENCOUNTER — Telehealth: Payer: Self-pay | Admitting: Internal Medicine

## 2023-02-09 DIAGNOSIS — N1832 Chronic kidney disease, stage 3b: Secondary | ICD-10-CM

## 2023-02-09 NOTE — Telephone Encounter (Signed)
-----   Message from [name], [place] sent at 01/28/2023  9:19 AM EDT ----- Pt has been made aware of his lab results.  He will contact Dr. Pat Kocher office regarding Kidney Function.  Will cc Dr. Lawerance Bach as well

## 2023-02-09 NOTE — Telephone Encounter (Signed)
Please call him - let him know since his most recent kidney function is worse I have ordered a referral for him to see a kidney doctor

## 2023-02-10 ENCOUNTER — Other Ambulatory Visit: Payer: Self-pay | Admitting: Cardiovascular Disease

## 2023-02-10 DIAGNOSIS — I4819 Other persistent atrial fibrillation: Secondary | ICD-10-CM

## 2023-02-10 NOTE — Telephone Encounter (Signed)
Eliquis 5mg  refill request received. Patient is 77 years old, weight-96.8kg, Crea-1.98 on 01/27/23, Diagnosis-Afib, and last seen by Dr. Elease Hashimoto on 11/12/22. Dose is appropriate based on dosing criteria. Will send in refill to requested pharmacy.

## 2023-02-10 NOTE — Telephone Encounter (Signed)
Spoke with patient today. 

## 2023-02-18 ENCOUNTER — Emergency Department (HOSPITAL_COMMUNITY)
Admission: EM | Admit: 2023-02-18 | Discharge: 2023-02-19 | Disposition: A | Discharge status: Home / Self Care | Payer: Medicare HMO | Attending: Emergency Medicine | Admitting: Emergency Medicine

## 2023-02-18 ENCOUNTER — Emergency Department (HOSPITAL_COMMUNITY): Payer: Medicare HMO

## 2023-02-18 ENCOUNTER — Other Ambulatory Visit: Payer: Self-pay

## 2023-02-18 DIAGNOSIS — S90112A Contusion of left great toe without damage to nail, initial encounter: Secondary | ICD-10-CM | POA: Insufficient documentation

## 2023-02-18 DIAGNOSIS — Z7901 Long term (current) use of anticoagulants: Secondary | ICD-10-CM | POA: Diagnosis not present

## 2023-02-18 DIAGNOSIS — U071 COVID-19: Secondary | ICD-10-CM | POA: Diagnosis not present

## 2023-02-18 DIAGNOSIS — Z7902 Long term (current) use of antithrombotics/antiplatelets: Secondary | ICD-10-CM | POA: Diagnosis not present

## 2023-02-18 DIAGNOSIS — R531 Weakness: Secondary | ICD-10-CM | POA: Diagnosis not present

## 2023-02-18 DIAGNOSIS — W228XXA Striking against or struck by other objects, initial encounter: Secondary | ICD-10-CM | POA: Diagnosis not present

## 2023-02-18 DIAGNOSIS — Z23 Encounter for immunization: Secondary | ICD-10-CM | POA: Diagnosis not present

## 2023-02-18 DIAGNOSIS — Z743 Need for continuous supervision: Secondary | ICD-10-CM | POA: Diagnosis not present

## 2023-02-18 DIAGNOSIS — R509 Fever, unspecified: Secondary | ICD-10-CM | POA: Diagnosis not present

## 2023-02-18 DIAGNOSIS — I10 Essential (primary) hypertension: Secondary | ICD-10-CM | POA: Diagnosis not present

## 2023-02-18 DIAGNOSIS — R000 Tachycardia, unspecified: Secondary | ICD-10-CM | POA: Diagnosis not present

## 2023-02-18 DIAGNOSIS — Z0389 Encounter for observation for other suspected diseases and conditions ruled out: Secondary | ICD-10-CM | POA: Diagnosis not present

## 2023-02-18 DIAGNOSIS — R410 Disorientation, unspecified: Secondary | ICD-10-CM | POA: Diagnosis not present

## 2023-02-18 LAB — COMPREHENSIVE METABOLIC PANEL
ALT: 37 U/L (ref 0–44)
AST: 34 U/L (ref 15–41)
Albumin: 3.9 g/dL (ref 3.5–5.0)
Alkaline Phosphatase: 51 U/L (ref 38–126)
Anion gap: 9 (ref 5–15)
BUN: 16 mg/dL (ref 8–23)
CO2: 23 mmol/L (ref 22–32)
Calcium: 9.2 mg/dL (ref 8.9–10.3)
Chloride: 103 mmol/L (ref 98–111)
Creatinine, Ser: 1.67 mg/dL — ABNORMAL HIGH (ref 0.61–1.24)
GFR, Estimated: 42 mL/min — ABNORMAL LOW (ref >60)
Glucose, Bld: 110 mg/dL — ABNORMAL HIGH (ref 70–99)
Potassium: 3.7 mmol/L (ref 3.5–5.1)
Sodium: 135 mmol/L (ref 135–145)
Total Bilirubin: 0.9 mg/dL (ref 0.3–1.2)
Total Protein: 6.8 g/dL (ref 6.5–8.1)

## 2023-02-18 LAB — CBC WITH DIFFERENTIAL/PLATELET
Abs Immature Granulocytes: 0.01 10*3/uL (ref 0.00–0.07)
Basophils Absolute: 0 10*3/uL (ref 0.0–0.1)
Basophils Relative: 0 %
Eosinophils Absolute: 0 10*3/uL (ref 0.0–0.5)
Eosinophils Relative: 1 %
HCT: 42.3 % (ref 39.0–52.0)
Hemoglobin: 14.3 g/dL (ref 13.0–17.0)
Immature Granulocytes: 0 %
Lymphocytes Relative: 10 %
Lymphs Abs: 0.6 10*3/uL — ABNORMAL LOW (ref 0.7–4.0)
MCH: 28.1 pg (ref 26.0–34.0)
MCHC: 33.8 g/dL (ref 30.0–36.0)
MCV: 83.3 fL (ref 80.0–100.0)
Monocytes Absolute: 0.4 10*3/uL (ref 0.1–1.0)
Monocytes Relative: 7 %
Neutro Abs: 4.7 10*3/uL (ref 1.7–7.7)
Neutrophils Relative %: 82 %
Platelets: 86 10*3/uL — ABNORMAL LOW (ref 150–400)
RBC: 5.08 MIL/uL (ref 4.22–5.81)
RDW: 13.3 % (ref 11.5–15.5)
WBC: 5.7 10*3/uL (ref 4.0–10.5)
nRBC: 0 % (ref 0.0–0.2)

## 2023-02-18 LAB — CBG MONITORING, ED: Glucose-Capillary: 110 mg/dL — ABNORMAL HIGH (ref 70–99)

## 2023-02-18 LAB — RESP PANEL BY RT-PCR (RSV, FLU A&B, COVID)  RVPGX2
Influenza A by PCR: NEGATIVE
Influenza B by PCR: NEGATIVE
Resp Syncytial Virus by PCR: NEGATIVE
SARS Coronavirus 2 by RT PCR: POSITIVE — AB

## 2023-02-18 LAB — LACTIC ACID, PLASMA: Lactic Acid, Venous: 1.3 mmol/L (ref 0.5–1.9)

## 2023-02-18 MED ORDER — SODIUM CHLORIDE 0.9 % IV SOLN
2.0000 g | Freq: Once | INTRAVENOUS | Status: AC
  Administered 2023-02-18: 2 g via INTRAVENOUS
  Filled 2023-02-18: qty 12.5

## 2023-02-18 MED ORDER — VANCOMYCIN HCL IN DEXTROSE 1-5 GM/200ML-% IV SOLN
1000.0000 mg | Freq: Once | INTRAVENOUS | Status: DC
  Filled 2023-02-18: qty 200

## 2023-02-18 MED ORDER — LACTATED RINGERS IV SOLN: INTRAVENOUS | Status: DC

## 2023-02-18 MED ORDER — VANCOMYCIN HCL 2000 MG/400ML IV SOLN
2000.0000 mg | Freq: Once | INTRAVENOUS | Status: AC
  Administered 2023-02-18: 2000 mg via INTRAVENOUS
  Filled 2023-02-18: qty 400

## 2023-02-18 MED ORDER — ACETAMINOPHEN 500 MG PO TABS
1000.0000 mg | ORAL_TABLET | Freq: Once | ORAL | Status: AC
  Administered 2023-02-18: 1000 mg via ORAL
  Filled 2023-02-18: qty 2

## 2023-02-18 MED ORDER — LACTATED RINGERS IV BOLUS: 500.0000 mL | Freq: Once | INTRAVENOUS | Status: DC

## 2023-02-18 MED ORDER — LACTATED RINGERS IV BOLUS (SEPSIS)
1000.0000 mL | Freq: Once | INTRAVENOUS | Status: AC
  Administered 2023-02-18: 1000 mL via INTRAVENOUS

## 2023-02-18 MED ORDER — VANCOMYCIN HCL 1250 MG/250ML IV SOLN: 1250.0000 mg | INTRAVENOUS | Status: DC

## 2023-02-18 MED ORDER — TETANUS-DIPHTH-ACELL PERTUSSIS 5-2.5-18.5 LF-MCG/0.5 IM SUSY
0.5000 mL | PREFILLED_SYRINGE | Freq: Once | INTRAMUSCULAR | Status: AC
  Administered 2023-02-18: 0.5 mL via INTRAMUSCULAR
  Filled 2023-02-18: qty 0.5

## 2023-02-18 MED ORDER — METRONIDAZOLE 500 MG/100ML IV SOLN
500.0000 mg | Freq: Once | INTRAVENOUS | Status: AC
  Administered 2023-02-18: 500 mg via INTRAVENOUS
  Filled 2023-02-18: qty 100

## 2023-02-18 MED ORDER — SODIUM CHLORIDE 0.9 % IV SOLN: 2.0000 g | Freq: Two times a day (BID) | INTRAVENOUS | Status: DC

## 2023-02-18 NOTE — ED Triage Notes (Signed)
Pt coming from home, c/o AMS and fever. Per wife pt had temps around 100.2 and tylenol not effective. Pt normally a/ox4, presenting today alert to self and place. Pt denies CP/SOB

## 2023-02-18 NOTE — Progress Notes (Signed)
Pharmacy Antibiotic Note  [name] is a 77 y.o. male for which pharmacy has been consulted for cefepime and vancomycin dosing for sepsis.  Patient with a history of HTN, NSTEMI, TIA, AF, gout, and prostate cancer. Patient presenting with AMS and fever of 100.2 on Tylenol..  SCr 1.67 - at baseline WBC 5.7; LA 1.3; T 102.60F; HR 93; RR 29  Plan: Vancomycin 2000 mg once then 1250 mg q24hr (eAUC 440) unless change in renal function Trend WBC, Fever, Renal function F/u cultures, clinical course, WBC De-escalate when able Levels at steady state F/u MRSA PCR  Height: 6\' 4"  (193 cm) Weight: 95 kg (209 lb 7 oz) IBW/kg (Calculated) : 86.8  Temp (24hrs), Avg:102.1 F (38.9 C), Min:102.1 F (38.9 C), Max:102.1 F (38.9 C)  Recent Labs  Lab 02/18/23 2104  WBC 5.7  CREATININE 1.67*  LATICACIDVEN 1.3    Estimated Creatinine Clearance: 44.8 mL/min (A) (by C-G formula based on SCr of 1.67 mg/dL (H)).    Allergies  Allergen Reactions   Nitroglycerin Other (See Comments)    Pt will not take.  States he was in hospital and after taking second NTG SL pt states "I had to be shocked, I am scared of that medicine and will not take again".    Lidocaine Other (See Comments)    Syncope post palmer injection; probably vasovagal as no reaction to intraarticular Lidocaine into knee    Antimicrobials this admission: Vancomycin 5/15 >>   Cefepime 5/15 >>   Flagyl 5/15 >>    Microbiology results: Pending  Thank you for involving pharmacy in this patient's care.  Enos Fling, PharmD PGY2 Pharmacy Resident 02/18/2023 10:04 PM

## 2023-02-18 NOTE — Sepsis Progress Note (Signed)
Elink monitoring for the code sepsis protocol.  

## 2023-02-18 NOTE — Discharge Instructions (Addendum)
It was our pleasure to provide your ER care today - we hope that you feel better. Your covid test is positive. Drink plenty of fluids/stay well hydrated. Stay active. Take full and deep breaths.   You may try myquil, mucinex, robitussin or similar over the counter cold and flu medication for symptom relief.   Follow up with primary care doctor in two weeks if symptoms fail to improve/resolve.  For toe, keep area very clean - wash with soap and water 2x/day. Change dressing daily and as need.   Return to ER if worse, new symptoms, increased trouble breathing, or other emergency concern.

## 2023-02-18 NOTE — ED Provider Notes (Signed)
[place] EMERGENCY DEPARTMENT AT [facility] Provider Note   CSN: [id number] Arrival date & time: 02/18/23  2033     History  Chief Complaint  Patient presents with   Altered Mental Status    [name] is a 77 y.o. male.  Pt with fever and confusion. Symptoms acute onset in past day. Spouse with recent febrile illness but she is now improved. No other specific known ill contacts. Pt normally alert, oriented x 4, currently a/o x 2. Pt notes non prod cough and mild sore throat. No trouble breathing or swallowing. No chest pain or discomfort. No sob. No abd pain or nvd. No dysuria or gu c/o. No extremity swelling or redness. Pt did stub left great toe coming into ED - pain to area.   The history is provided by the patient, medical records, the EMS personnel and the spouse. The history is limited by the condition of the patient.  Altered Mental Status Associated symptoms: fever   Associated symptoms: no abdominal pain, no headaches, no rash and no vomiting        Home Medications Prior to Admission medications   Medication Sig Start Date End Date Taking? Authorizing Provider  allopurinol (ZYLOPRIM) 100 MG tablet Take 1 tablet (100 mg total) by mouth daily. 10/21/22   Pincus Sanes, MD  calcium carbonate (TUMS - DOSED IN MG ELEMENTAL CALCIUM) 500 MG chewable tablet Chew 2 tablets by mouth daily as needed for indigestion or heartburn.     [provider]  clopidogrel (PLAVIX) 75 MG tablet TAKE 1 TABLET BY MOUTH EVERY DAY WITH BREAKFAST 11/03/22   Nahser, Deloris Ping, MD  Cyanocobalamin (VITAMIN B12 PO) Take 1 tablet by mouth every evening.    [provider]  diltiazem (CARDIZEM CD) 240 MG 24 hr capsule TAKE 1 CAPSULE BY MOUTH EVERY DAY 12/29/22   Nahser, Deloris Ping, MD  ELIQUIS 5 MG TABS tablet TAKE 1 TABLET BY MOUTH TWICE A DAY 02/10/23   Nahser, Deloris Ping, MD  ezetimibe (ZETIA) 10 MG tablet TAKE 1 TABLET BY MOUTH EVERY DAY 12/29/22   Nahser, Deloris Ping, MD  fish  oil-omega-3 fatty acids 1000 MG capsule Take 2 g by mouth 2 (two) times daily.      [provider]  metoprolol tartrate (LOPRESSOR) 50 MG tablet TAKE 1 TABLET BY MOUTH TWICE A DAY 11/03/22   Nahser, Deloris Ping, MD  Misc Natural Products (TART CHERRY ADVANCED PO) Take 1,000 mg by mouth daily.    [provider]  nitroGLYCERIN (NITROSTAT) 0.4 MG SL tablet Place 1 tablet (0.4 mg total) under the tongue every 5 (five) minutes x 3 doses as needed for chest pain. 08/06/19   Leone Brand, NP  Potassium Gluconate 2 MEQ TABS Take 1 tablet by mouth daily.    [provider]  rosuvastatin (CRESTOR) 20 MG tablet Take 1 tablet (20 mg total) by mouth daily. 10/17/22   Swinyer, Zachary George, NP      Allergies    Nitroglycerin and Lidocaine    Review of Systems   Review of Systems  Constitutional:  Positive for fever.  HENT:  Positive for sore throat.   Eyes:  Negative for redness.  Respiratory:  Positive for cough. Negative for shortness of breath.   Cardiovascular:  Negative for chest pain.  Gastrointestinal:  Negative for abdominal pain, diarrhea and vomiting.  Genitourinary:  Negative for dysuria, flank pain, scrotal swelling and testicular pain.  Musculoskeletal:  Negative for  back pain, neck pain and neck stiffness.  Skin:  Negative for rash.  Neurological:  Negative for headaches.  Hematological:  Does not bruise/bleed easily.    Physical Exam Updated Vital Signs BP 132/79   Pulse 86   Temp (!) 102.1 F (38.9 C) (Oral)   Resp (!) 28   Ht 1.93 m (6\' 4" )   Wt 95 kg   SpO2 95%   BMI 25.49 kg/m  Physical Exam Vitals and nursing note reviewed.  Constitutional:      Appearance: Normal appearance. He is well-developed.  HENT:     Head: Atraumatic.     Nose: Nose normal.     Mouth/Throat:     Mouth: Mucous membranes are moist.     Pharynx: Oropharynx is clear.  Eyes:     General: No scleral icterus.    Conjunctiva/sclera: Conjunctivae normal.     Pupils:  Pupils are equal, round, and reactive to light.  Neck:     Vascular: No carotid bruit.     Trachea: No tracheal deviation.     Comments: No stiffness or rigidity.  Cardiovascular:     Rate and Rhythm: Normal rate and regular rhythm.     Pulses: Normal pulses.     Heart sounds: Normal heart sounds. No murmur heard.    No friction rub. No gallop.  Pulmonary:     Effort: Pulmonary effort is normal. No accessory muscle usage or respiratory distress.     Breath sounds: Normal breath sounds.  Abdominal:     General: Bowel sounds are normal. There is no distension.     Palpations: Abdomen is soft. There is no mass.     Tenderness: There is no abdominal tenderness. There is no guarding.  Genitourinary:    Comments: No cva tenderness.normal external gu exam.  Musculoskeletal:        General: No swelling or tenderness.     Cervical back: Normal range of motion and neck supple. No rigidity.     Right lower leg: No edema.     Left lower leg: No edema.     Comments: Abrasion/superficial lac left great toe with tenderness to area. No bone exposed. Normal cap refill distally.   Skin:    General: Skin is warm and dry.     Findings: No rash.  Neurological:     Mental Status: He is alert.     Comments: Alert, speech clear. No dysarthria or aphasia. Oriented to person/place. Motor/sens grossly intact bil.   Psychiatric:        Mood and Affect: Mood normal.     ED Results / Procedures / Treatments   Labs (all labs ordered are listed, but only abnormal results are displayed) Results for orders placed or performed during the hospital encounter of 02/18/23  Resp panel by RT-PCR (RSV, Flu A&B, Covid)   Specimen: Nasal Swab  Result Value Ref Range   SARS Coronavirus 2 by RT PCR POSITIVE (A) NEGATIVE   Influenza A by PCR NEGATIVE NEGATIVE   Influenza B by PCR NEGATIVE NEGATIVE   Resp Syncytial Virus by PCR NEGATIVE NEGATIVE  Lactic acid, plasma  Result Value Ref Range   Lactic Acid, Venous 1.3  0.5 - 1.9 mmol/L  Comprehensive metabolic panel  Result Value Ref Range   Sodium 135 135 - 145 mmol/L   Potassium 3.7 3.5 - 5.1 mmol/L   Chloride 103 98 - 111 mmol/L   CO2 23 22 - 32 mmol/L   Glucose, Bld 110 (  H) 70 - 99 mg/dL   BUN 16 8 - 23 mg/dL   Creatinine, Ser 1.61 (H) 0.61 - 1.24 mg/dL   Calcium 9.2 8.9 - 09.6 mg/dL   Total Protein 6.8 6.5 - 8.1 g/dL   Albumin 3.9 3.5 - 5.0 g/dL   AST 34 15 - 41 U/L   ALT 37 0 - 44 U/L   Alkaline Phosphatase 51 38 - 126 U/L   Total Bilirubin 0.9 0.3 - 1.2 mg/dL   GFR, Estimated 42 (L) >60 mL/min   Anion gap 9 5 - 15  CBC with Differential  Result Value Ref Range   WBC 5.7 4.0 - 10.5 K/uL   RBC 5.08 4.22 - 5.81 MIL/uL   Hemoglobin 14.3 13.0 - 17.0 g/dL   HCT 04.5 40.9 - 81.1 %   MCV 83.3 80.0 - 100.0 fL   MCH 28.1 26.0 - 34.0 pg   MCHC 33.8 30.0 - 36.0 g/dL   RDW 91.4 78.2 - 95.6 %   Platelets 86 (L) 150 - 400 K/uL   nRBC 0.0 0.0 - 0.2 %   Neutrophils Relative % 82 %   Neutro Abs 4.7 1.7 - 7.7 K/uL   Lymphocytes Relative 10 %   Lymphs Abs 0.6 (L) 0.7 - 4.0 K/uL   Monocytes Relative 7 %   Monocytes Absolute 0.4 0.1 - 1.0 K/uL   Eosinophils Relative 1 %   Eosinophils Absolute 0.0 0.0 - 0.5 K/uL   Basophils Relative 0 %   Basophils Absolute 0.0 0.0 - 0.1 K/uL   Immature Granulocytes 0 %   Abs Immature Granulocytes 0.01 0.00 - 0.07 K/uL     EKG EKG Interpretation  Date/Time:  Wednesday Feb 18 2023 20:37:59 EDT Ventricular Rate:  97 PR Interval:    QRS Duration: 82 QT Interval:  333 QTC Calculation: 423 R Axis:   45 Text Interpretation: Atrial fibrillation Non-specific ST-t changes Confirmed by Cathren Laine ([id number]) on 02/18/2023 9:54:34 PM  Radiology DG Toe Great Left  Result Date: 02/18/2023 CLINICAL DATA:  Contusion laceration EXAM: LEFT GREAT TOE COMPARISON:  None Available. FINDINGS: No fracture or malalignment. Moderate arthritis at the first MTP joint. Soft tissues are unremarkable. Small plantar calcaneal spur  IMPRESSION: No acute osseous abnormality. Electronically Signed   By: Jasmine Pang M.D.   On: 02/18/2023 21:43   DG Chest Port 1 View  Result Date: 02/18/2023 CLINICAL DATA:  Possible sepsis EXAM: PORTABLE CHEST 1 VIEW COMPARISON:  08/03/2019 FINDINGS: The heart size and mediastinal contours are within normal limits. Both lungs are clear. The visualized skeletal structures are unremarkable. IMPRESSION: No active disease. Electronically Signed   By: Jasmine Pang M.D.   On: 02/18/2023 21:41    Procedures Procedures    Medications Ordered in ED Medications  lactated ringers infusion ( Intravenous New Bag/Given 02/18/23 2241)  vancomycin (VANCOREADY) IVPB 2000 mg/400 mL (2,000 mg Intravenous New Bag/Given 02/18/23 2237)  vancomycin (VANCOREADY) IVPB 1250 mg/250 mL (has no administration in time range)  ceFEPIme (MAXIPIME) 2 g in sodium chloride 0.9 % 100 mL IVPB (has no administration in time range)  acetaminophen (TYLENOL) tablet 1,000 mg (has no administration in time range)  lactated ringers bolus 1,000 mL (0 mLs Intravenous Stopped 02/18/23 2233)  ceFEPIme (MAXIPIME) 2 g in sodium chloride 0.9 % 100 mL IVPB (0 g Intravenous Stopped 02/18/23 2232)  metroNIDAZOLE (FLAGYL) IVPB 500 mg (0 mg Intravenous Stopped 02/18/23 2233)  Tdap (BOOSTRIX) injection 0.5 mL (0.5 mLs Intramuscular Given 02/18/23 2237)  ED Course/ Medical Decision Making/ A&P                             Medical Decision Making Problems Addressed: Acute febrile illness: acute illness or injury with systemic symptoms that poses a threat to life or bodily functions Contusion of left great toe without damage to nail, initial encounter: acute illness or injury COVID-19 virus infection: acute illness or injury with systemic symptoms that poses a threat to life or bodily functions  Amount and/or Complexity of Data Reviewed Independent Historian: spouse and EMS    Details: hx External Data Reviewed: notes. Labs: ordered.  Decision-making details documented in ED Course. Radiology: ordered and independent interpretation performed. Decision-making details documented in ED Course. ECG/medicine tests: ordered and independent interpretation performed. Decision-making details documented in ED Course.  Risk OTC drugs. Prescription drug management. Decision regarding hospitalization.   Iv ns. Continuous pulse ox and cardiac monitoring. Labs ordered/sent. Imaging ordered.   Differential diagnosis includes covid, flu, pna, uti, sepsis, etc. Dispo decision including potential need for admission considered - will get labs and imaging and reassess.   Reviewed nursing notes and prior charts for additional history. External reports reviewed. Additional history from: EMS, spouse.   Cardiac monitor: sinus rhythm, rate 102.  LR bolus. Cxs sent. Iv abx.   Labs reviewed/interpreted by me - lactate normal. Lytes normal. Covid is positive.   Xrays reviewed/interpreted by me - no pna. No fx.   Wound cleaned, sterile dressing (does not appear to require sutures).   Recheck, pt breathing comfortably. No distress. Pulse ox 98% room air.  Pt currently appears stable for d/c.   Return precautions provided.             Final Clinical Impression(s) / ED Diagnoses Final diagnoses:  COVID-19 virus infection  Acute febrile illness  Contusion of left great toe without damage to nail, initial encounter    Rx / DC Orders ED Discharge Orders     None         Cathren Laine, MD 02/18/23 2303

## 2023-02-19 ENCOUNTER — Telehealth: Payer: Self-pay | Admitting: Internal Medicine

## 2023-02-19 DIAGNOSIS — U071 COVID-19: Secondary | ICD-10-CM

## 2023-02-19 LAB — CULTURE, BLOOD (ROUTINE X 2)

## 2023-02-19 NOTE — Telephone Encounter (Signed)
Patient's wife called and said the patient went to the hospital last night 02/18/23 and tested positive for Covid. She would like to know if something can be prescribed to help.

## 2023-02-20 ENCOUNTER — Other Ambulatory Visit: Payer: Self-pay | Admitting: Internal Medicine

## 2023-02-20 LAB — CULTURE, BLOOD (ROUTINE X 2)

## 2023-02-20 MED ORDER — HYDROCODONE BIT-HOMATROP MBR 5-1.5 MG/5ML PO SOLN
5.0000 mL | Freq: Two times a day (BID) | ORAL | 0 refills | Status: DC | PRN
Start: 2023-02-20 — End: 2023-03-25

## 2023-02-20 MED ORDER — MOLNUPIRAVIR 200 MG PO CAPS: 4.0000 | ORAL_CAPSULE | Freq: Two times a day (BID) | ORAL | 0 refills | Status: AC

## 2023-02-20 NOTE — Telephone Encounter (Signed)
Hycodan cough syrup sent to the patient's pharmacy.  He can take 1 teaspoon by mouth every 12 hours as needed for coughing.  This can cause sedation so he should avoid driving while taking this medication as well as avoid operating any heavy machinery.

## 2023-02-20 NOTE — Telephone Encounter (Signed)
Message sent to Dr. Lawerance Bach already from pharmacy.

## 2023-02-20 NOTE — Telephone Encounter (Signed)
Spoke with spouse today and info given. 

## 2023-02-20 NOTE — Telephone Encounter (Signed)
Left message for patient with recommendations. 

## 2023-02-20 NOTE — Addendum Note (Signed)
Addended by: Elenore Paddy on: 02/20/2023 04:57 PM   Modules accepted: Orders

## 2023-02-20 NOTE — Telephone Encounter (Signed)
Patient wife called states insurance will not cover the molnupiravir. States they would need Dr.Burns to send something stating why patient cannot take the paxlovid and needs the molnupiravir.

## 2023-02-20 NOTE — Telephone Encounter (Signed)
Paxlovid would have multiple interactions with several of his medications and therefore is not recommended.  I can prescribe him molnupiravir which is an antiviral for COVID, but probably not as effective as the Paxlovid if he wants it-let me know.

## 2023-02-21 LAB — CULTURE, BLOOD (ROUTINE X 2): Special Requests: ADEQUATE

## 2023-02-22 LAB — CULTURE, BLOOD (ROUTINE X 2)
Culture: NO GROWTH
Special Requests: ADEQUATE

## 2023-02-23 LAB — CULTURE, BLOOD (ROUTINE X 2): Culture: NO GROWTH

## 2023-02-24 ENCOUNTER — Telehealth: Payer: Self-pay | Admitting: *Deleted

## 2023-02-24 NOTE — Telephone Encounter (Signed)
Transition Care Management Unsuccessful Follow-up Telephone Call  Date of discharge and from where:  [facility] 5/16   Attempts:  1st Attempt  Reason for unsuccessful TCM follow-up call:  Left voice message

## 2023-02-26 ENCOUNTER — Telehealth: Payer: Self-pay | Admitting: *Deleted

## 2023-02-26 NOTE — Telephone Encounter (Signed)
Transition Care Management Follow-up Telephone Call Date of discharge and from where: 02/19/2023 [facility]  How have you been since you were released from the hospital? 8 days Any questions or concerns? No  Items Reviewed: Did the pt receive and understand the discharge instructions provided? Yes  Medications obtained and verified? Yes  Other? No  Any new allergies since your discharge? No  Dietary orders reviewed? No Do you have support at home? No    PCP Hospital f/u appt confirmed? No Still coughing real bad but hes better from covid will go back in f/u in 2 weeks as directed by ED  . Are transportation arrangements needed? No  If their condition worsens, is the pt aware to call PCP or go to the Emergency Dept.? Yes Was the patient provided with contact information for the PCP's office or ED? Yes Was to pt encouraged to call back with questions or concerns? Yes

## 2023-03-25 ENCOUNTER — Telehealth: Payer: Self-pay

## 2023-03-25 ENCOUNTER — Ambulatory Visit (INDEPENDENT_AMBULATORY_CARE_PROVIDER_SITE_OTHER): Payer: Medicare HMO

## 2023-03-25 ENCOUNTER — Encounter: Payer: Self-pay | Admitting: Family Medicine

## 2023-03-25 ENCOUNTER — Ambulatory Visit (INDEPENDENT_AMBULATORY_CARE_PROVIDER_SITE_OTHER): Payer: Medicare HMO | Admitting: Family Medicine

## 2023-03-25 VITALS — BP 122/86 | HR 55 | Temp 97.6°F | Ht 76.0 in | Wt 199.0 lb

## 2023-03-25 DIAGNOSIS — Z7901 Long term (current) use of anticoagulants: Secondary | ICD-10-CM | POA: Diagnosis not present

## 2023-03-25 DIAGNOSIS — R918 Other nonspecific abnormal finding of lung field: Secondary | ICD-10-CM | POA: Diagnosis not present

## 2023-03-25 DIAGNOSIS — Z8616 Personal history of COVID-19: Secondary | ICD-10-CM | POA: Diagnosis not present

## 2023-03-25 DIAGNOSIS — R0602 Shortness of breath: Secondary | ICD-10-CM | POA: Diagnosis not present

## 2023-03-25 DIAGNOSIS — R5383 Other fatigue: Secondary | ICD-10-CM | POA: Diagnosis not present

## 2023-03-25 DIAGNOSIS — R042 Hemoptysis: Secondary | ICD-10-CM

## 2023-03-25 DIAGNOSIS — R911 Solitary pulmonary nodule: Secondary | ICD-10-CM | POA: Diagnosis not present

## 2023-03-25 LAB — COMPREHENSIVE METABOLIC PANEL
ALT: 34 U/L (ref 0–53)
AST: 27 U/L (ref 0–37)
Albumin: 4.2 g/dL (ref 3.5–5.2)
Alkaline Phosphatase: 64 U/L (ref 39–117)
BUN: 17 mg/dL (ref 6–23)
CO2: 29 mEq/L (ref 19–32)
Calcium: 9.6 mg/dL (ref 8.4–10.5)
Chloride: 104 mEq/L (ref 96–112)
Creatinine, Ser: 1.59 mg/dL — ABNORMAL HIGH (ref 0.40–1.50)
GFR: 41.21 mL/min — ABNORMAL LOW (ref >60.00)
Glucose, Bld: 84 mg/dL (ref 70–99)
Potassium: 4.5 mEq/L (ref 3.5–5.1)
Sodium: 139 mEq/L (ref 135–145)
Total Bilirubin: 0.7 mg/dL (ref 0.2–1.2)
Total Protein: 7.1 g/dL (ref 6.0–8.3)

## 2023-03-25 LAB — CBC WITH DIFFERENTIAL/PLATELET
Basophils Absolute: 0 10*3/uL (ref 0.0–0.1)
Basophils Relative: 0.4 % (ref 0.0–3.0)
Eosinophils Absolute: 0.1 10*3/uL (ref 0.0–0.7)
Eosinophils Relative: 2.3 % (ref 0.0–5.0)
HCT: 41.9 % (ref 39.0–52.0)
Hemoglobin: 14.1 g/dL (ref 13.0–17.0)
Lymphocytes Relative: 28.3 % (ref 12.0–46.0)
Lymphs Abs: 1.6 10*3/uL (ref 0.7–4.0)
MCHC: 33.7 g/dL (ref 30.0–36.0)
MCV: 84.4 fl (ref 78.0–100.0)
Monocytes Absolute: 0.4 10*3/uL (ref 0.1–1.0)
Monocytes Relative: 6.5 % (ref 3.0–12.0)
Neutro Abs: 3.6 10*3/uL (ref 1.4–7.7)
Neutrophils Relative %: 62.5 % (ref 43.0–77.0)
Platelets: 118 10*3/uL — ABNORMAL LOW (ref 150.0–400.0)
RBC: 4.97 Mil/uL (ref 4.22–5.81)
RDW: 14.9 % (ref 11.5–15.5)
WBC: 5.7 10*3/uL (ref 4.0–10.5)

## 2023-03-25 LAB — PROTIME-INR
INR: 1.5 ratio — ABNORMAL HIGH (ref 0.8–1.0)
Prothrombin Time: 15.7 s — ABNORMAL HIGH (ref 9.6–13.1)

## 2023-03-25 NOTE — Progress Notes (Signed)
His labs are stable, nothing concerning. Let's get the CT of his chest as our next step.

## 2023-03-25 NOTE — Progress Notes (Signed)
Subjective:  [name] is a 77 y.o. male who presents for a 4 wk hx of cough. Cough productive with blood x 1 wk. He is on Eliquis for A-fib. Recent work up in the ED for possible sepsis r/t Covid. He had fever and confusion at that time. Those symptoms resolved. He reports feeling much better than 4 wks ago but still having fatigue and mild shortness of breath.   Denies fever, chills, headache, chest pain, palpitations, abdominal pain, N/V/D.    ROS as in subjective.   Objective: Vitals:   03/25/23 1046  BP: 122/86  Pulse: (!) 55  Temp: 97.6 F (36.4 C)  SpO2: 98%    General appearance: Alert, WD/WN, no distress, mildly ill appearing                             Skin: warm, no rash                           Head: no sinus tenderness                            Eyes: conjunctiva normal, corneas clear, PERRLA                            Ears: pearly TMs, external ear canals normal                          Nose: septum midline, turbinates swollen, with erythema             Mouth/throat: MMM, tongue normal, mild pharyngeal erythema                           Neck: supple, no adenopathy, no thyromegaly, nontender                          Heart: RRR               Lungs: diminished lung sounds in bilateral bases, worse on left, no wheezes, rales, or rhonchi Extremities: no edema  Neuro: PERRLA, EOMs intact, CNs intact, mild tremor noted in bilateral upper arms      Assessment: Hemoptysis - Plan: CBC with Differential/Platelet, Protime-INR, Comprehensive metabolic panel, DG Chest 2 View, Comprehensive metabolic panel, Protime-INR, CBC with Differential/Platelet, CT CHEST W CONTRAST  Anticoagulant long-term use  Shortness of breath - Plan: DG Chest 2 View  Personal history of COVID-19  Fatigue, unspecified type - Plan: CBC with Differential/Platelet, Comprehensive metabolic panel, DG Chest 2 View, Comprehensive metabolic panel, CBC with Differential/Platelet  Pulmonary nodule seen  on imaging study - Plan: CT CHEST W CONTRAST  Abnormal findings on diagnostic imaging of lung - Plan: CT CHEST W CONTRAST    Plan: Reviewed ED notes and results. Negative chest XR -1 view on 02/18/2023.  +Covid 4 wks ago He received multiple antibiotics via IV. Sepsis ruled out and he was dc'ed home.  Reports feeling improved except for lingering cough and hemoptysis x 1 wk.  On Eliquis.  No distress on exam.  STAT chest X ray, PT/INR, CBC, CMP ordered today.  Follow up pending results.   Addendum:  7 mm pulmonary nodule seen on X ray. Recommend CT chest. STAT  chest CT w contrast ordered.

## 2023-03-25 NOTE — Telephone Encounter (Signed)
CRITICAL VALUE STICKER  CRITICAL VALUE: XRay  RECEIVER (on-site recipient of call): Kim  DATE & TIME NOTIFIED: 6/19; 12:36p  MD NOTIFIED: Suezanne Jacquet   Findings: A 7 mm nodular opacity projects in the region of the left lung

## 2023-03-25 NOTE — Progress Notes (Signed)
Please call patient and wife and let them know that his chest x-ray shows a 7 mm nodule in his left lung.  The radiologist recommends a CT scan of his chest to find out what this abnormal finding means.  I have ordered the CT and he should get a call to have this done today or tomorrow.  We will be in touch again when I have his lab results, I am still waiting on these

## 2023-03-25 NOTE — Patient Instructions (Signed)
Please go downstairs for labs and a chest X ray before you leave today.   We will be in touch.

## 2023-03-27 ENCOUNTER — Ambulatory Visit (HOSPITAL_COMMUNITY)
Admission: RE | Admit: 2023-03-27 | Discharge: 2023-03-27 | Disposition: A | Discharge status: Home / Self Care | Payer: Medicare HMO | Source: Ambulatory Visit | Attending: Family Medicine | Admitting: Family Medicine

## 2023-03-27 DIAGNOSIS — R042 Hemoptysis: Secondary | ICD-10-CM | POA: Diagnosis not present

## 2023-03-27 DIAGNOSIS — R918 Other nonspecific abnormal finding of lung field: Secondary | ICD-10-CM | POA: Diagnosis not present

## 2023-03-27 DIAGNOSIS — I700 Atherosclerosis of aorta: Secondary | ICD-10-CM | POA: Diagnosis not present

## 2023-03-27 DIAGNOSIS — R911 Solitary pulmonary nodule: Secondary | ICD-10-CM | POA: Diagnosis not present

## 2023-03-27 MED ORDER — IOHEXOL 300 MG/ML  SOLN
60.0000 mL | Freq: Once | INTRAMUSCULAR | Status: AC | PRN
  Administered 2023-03-27: 75 mL via INTRAVENOUS

## 2023-03-27 NOTE — Progress Notes (Signed)
Please call patient and let him know that his CT did not show a pulmonary/lung nodule.  This is good news.  It did show some chronic issues.  Please ask him to follow-up with Dr. Lawerance Bach if he is still having symptoms.

## 2023-03-30 ENCOUNTER — Telehealth: Payer: Self-pay | Admitting: Internal Medicine

## 2023-03-30 NOTE — Telephone Encounter (Signed)
Patient's wife called back about imaging results. Best callback is [phone].

## 2023-03-30 NOTE — Telephone Encounter (Signed)
Pt called returning phone call about test results for [facility].

## 2023-03-31 NOTE — Telephone Encounter (Signed)
Spoke with Pt about CT scan results. Pt understood. Offered a F/u appt with Dr. Lawerance Bach he declined. Did say his symptoms stopped after the appt with [facility] and will call and make an appt if he needs one.

## 2023-05-07 ENCOUNTER — Other Ambulatory Visit: Payer: Self-pay | Admitting: Cardiovascular Disease

## 2023-07-01 ENCOUNTER — Other Ambulatory Visit: Payer: Self-pay | Admitting: Nephrology

## 2023-07-01 DIAGNOSIS — N189 Chronic kidney disease, unspecified: Secondary | ICD-10-CM | POA: Diagnosis not present

## 2023-07-01 DIAGNOSIS — N1832 Chronic kidney disease, stage 3b: Secondary | ICD-10-CM | POA: Diagnosis not present

## 2023-07-01 DIAGNOSIS — N2581 Secondary hyperparathyroidism of renal origin: Secondary | ICD-10-CM | POA: Diagnosis not present

## 2023-07-01 DIAGNOSIS — D631 Anemia in chronic kidney disease: Secondary | ICD-10-CM

## 2023-07-01 DIAGNOSIS — I129 Hypertensive chronic kidney disease with stage 1 through stage 4 chronic kidney disease, or unspecified chronic kidney disease: Secondary | ICD-10-CM | POA: Diagnosis not present

## 2023-07-01 LAB — LAB REPORT - SCANNED: EGFR: 41

## 2023-07-06 ENCOUNTER — Ambulatory Visit
Admission: RE | Admit: 2023-07-06 | Discharge: 2023-07-06 | Disposition: A | Discharge status: Home / Self Care | Payer: Medicare HMO | Source: Ambulatory Visit | Attending: Nephrology | Admitting: Nephrology

## 2023-07-06 DIAGNOSIS — D631 Anemia in chronic kidney disease: Secondary | ICD-10-CM

## 2023-07-06 DIAGNOSIS — N189 Chronic kidney disease, unspecified: Secondary | ICD-10-CM | POA: Diagnosis not present

## 2023-07-06 DIAGNOSIS — N1832 Chronic kidney disease, stage 3b: Secondary | ICD-10-CM

## 2023-07-06 DIAGNOSIS — N2581 Secondary hyperparathyroidism of renal origin: Secondary | ICD-10-CM

## 2023-07-06 DIAGNOSIS — R161 Splenomegaly, not elsewhere classified: Secondary | ICD-10-CM | POA: Diagnosis not present

## 2023-08-17 ENCOUNTER — Other Ambulatory Visit: Payer: Self-pay | Admitting: *Deleted

## 2023-08-17 DIAGNOSIS — E8809 Other disorders of plasma-protein metabolism, not elsewhere classified: Secondary | ICD-10-CM

## 2023-08-17 DIAGNOSIS — D472 Monoclonal gammopathy: Secondary | ICD-10-CM

## 2023-08-18 ENCOUNTER — Inpatient Hospital Stay: Payer: Medicare HMO | Attending: Internal Medicine | Admitting: Internal Medicine

## 2023-08-18 ENCOUNTER — Inpatient Hospital Stay: Payer: Medicare HMO

## 2023-08-18 VITALS — BP 124/82 | HR 72 | Temp 97.8°F | Resp 17 | Ht 76.0 in | Wt 202.1 lb

## 2023-08-18 DIAGNOSIS — I509 Heart failure, unspecified: Secondary | ICD-10-CM | POA: Diagnosis not present

## 2023-08-18 DIAGNOSIS — D472 Monoclonal gammopathy: Secondary | ICD-10-CM | POA: Diagnosis not present

## 2023-08-18 DIAGNOSIS — I130 Hypertensive heart and chronic kidney disease with heart failure and stage 1 through stage 4 chronic kidney disease, or unspecified chronic kidney disease: Secondary | ICD-10-CM | POA: Insufficient documentation

## 2023-08-18 DIAGNOSIS — E538 Deficiency of other specified B group vitamins: Secondary | ICD-10-CM | POA: Insufficient documentation

## 2023-08-18 DIAGNOSIS — F17290 Nicotine dependence, other tobacco product, uncomplicated: Secondary | ICD-10-CM | POA: Insufficient documentation

## 2023-08-18 DIAGNOSIS — E8809 Other disorders of plasma-protein metabolism, not elsewhere classified: Secondary | ICD-10-CM

## 2023-08-18 DIAGNOSIS — Z79899 Other long term (current) drug therapy: Secondary | ICD-10-CM | POA: Diagnosis not present

## 2023-08-18 DIAGNOSIS — N1832 Chronic kidney disease, stage 3b: Secondary | ICD-10-CM | POA: Diagnosis not present

## 2023-08-18 LAB — CMP (CANCER CENTER ONLY)
ALT: 30 U/L (ref 0–44)
AST: 28 U/L (ref 15–41)
Albumin: 4.3 g/dL (ref 3.5–5.0)
Alkaline Phosphatase: 51 U/L (ref 38–126)
Anion gap: 5 (ref 5–15)
BUN: 16 mg/dL (ref 8–23)
CO2: 30 mmol/L (ref 22–32)
Calcium: 9.5 mg/dL (ref 8.9–10.3)
Chloride: 103 mmol/L (ref 98–111)
Creatinine: 1.7 mg/dL — ABNORMAL HIGH (ref 0.61–1.24)
GFR, Estimated: 41 mL/min — ABNORMAL LOW (ref >60)
Glucose, Bld: 91 mg/dL (ref 70–99)
Potassium: 4 mmol/L (ref 3.5–5.1)
Sodium: 138 mmol/L (ref 135–145)
Total Bilirubin: 0.8 mg/dL (ref <1.2)
Total Protein: 6.9 g/dL (ref 6.5–8.1)

## 2023-08-18 LAB — CBC WITH DIFFERENTIAL (CANCER CENTER ONLY)
Abs Immature Granulocytes: 0 10*3/uL (ref 0.00–0.07)
Basophils Absolute: 0 10*3/uL (ref 0.0–0.1)
Basophils Relative: 1 %
Eosinophils Absolute: 0.1 10*3/uL (ref 0.0–0.5)
Eosinophils Relative: 2 %
HCT: 41.9 % (ref 39.0–52.0)
Hemoglobin: 14.5 g/dL (ref 13.0–17.0)
Immature Granulocytes: 0 %
Lymphocytes Relative: 29 %
Lymphs Abs: 1.4 10*3/uL (ref 0.7–4.0)
MCH: 29 pg (ref 26.0–34.0)
MCHC: 34.6 g/dL (ref 30.0–36.0)
MCV: 83.8 fL (ref 80.0–100.0)
Monocytes Absolute: 0.3 10*3/uL (ref 0.1–1.0)
Monocytes Relative: 6 %
Neutro Abs: 3 10*3/uL (ref 1.7–7.7)
Neutrophils Relative %: 62 %
Platelet Count: 113 10*3/uL — ABNORMAL LOW (ref 150–400)
RBC: 5 MIL/uL (ref 4.22–5.81)
RDW: 13.4 % (ref 11.5–15.5)
Smear Review: NORMAL
WBC Count: 4.9 10*3/uL (ref 4.0–10.5)
nRBC: 0 % (ref 0.0–0.2)

## 2023-08-18 LAB — LACTATE DEHYDROGENASE: LDH: 140 U/L (ref 98–192)

## 2023-08-18 NOTE — Progress Notes (Signed)
[facility] [phone]   [phone]  CONSULT NOTE  REFERRING PHYSICIAN: Dr. Zetta Bills  REASON FOR CONSULTATION:  77 years old white male with monoclonal gammopathy  HPI [name] is a 77 y.o. male came to the clinic today for initial evaluation and recommendation regarding finding of M spike on the recent blood work by his nephrologist. Discussed the use of AI scribe software for clinical note transcription with the patient, who gave verbal consent to proceed.  History of Present Illness   The patient, a 77 year old individual with a history of chronic kidney disease (CKD) stage 3B, congestive heart failure, hypertension, and a history of melanoma and prostate cancer, was referred for evaluation of abnormal protein in the blood. The patient's primary care physician had noticed elevated serum creatinine, indicative of poor kidney function, and referred the patient to a nephrologist.  The patient reported no current symptoms related to this condition, such as aching pain or other discomforts. However, he did mention that he experiences general aches due to his age. The patient's significant medical history includes a heart attack, stroke, and atrial fibrillation. He denied having diabetes. The patient also reported a history of smoking cigars occasionally but denied regular cigarette use or alcohol consumption.  The patient's family history is significant for heart disease and stroke in the mother and prostate cancer in the father. The patient has been married for 60 years and has two sons. He previously worked for a [employer], climbing telephone poles.  The patient's current condition was discovered about ten to twelve years ago during an emergency room visit for a heart attack. Since then, the patient has been under regular medical care.       Past Medical History:  Diagnosis Date   Anemia, mild    CAD (coronary artery disease)     a. 02/2010 NSTEMI/PCI: PROMUS DES to mRCA. b. 06/05/14 Cath/PCI: pLAD 70%--> s/p PCI/DES (Promus DES);  c. 01/2015 Cath/PCI: LM nl, LAD 12m, patent prox stent, LCX patent prox stent, RCA 30p, 66m (3.5x20 Synergy DES). // Myoview 07/2019:  EF 48, atten artifact, no ischemia, intermediate risk (low EF)    CKD (chronic kidney disease), stage III (HCC)    Diastolic dysfunction    a. Echo 6/11: mild LVH, EF 55-60%, GR1DD, Trivial MR, mild LAE. b. echo 06/04/14: EF 55-60%, no WMA, GR1DD, Ao valve mildly calcifed, mild MR, mild LAE   History of pulmonary embolism    Hyperlipidemia    Hypertension    did not tolerate Lisinopril event at low dose   Melanoma (HCC)    a. 1987. b. 2012   Myocardial infarction (HCC)    Persistent atrial fibrillation (HCC) 12/03/2018   Dx 11/2018 >> Apixaban started // Echo 12/2018: EF 55-60, mod RVE, normal RVSF, mild LAE, trivial MR, mild TR, mod AoV sclerosis    Prostate cancer (HCC)    Dr Vernie Ammons   Stroke [facility])    TIA (transient ischemic attack)    a. post cath and PCI on 06/05/2014 - no residual sequelae. // ?recurrent TIA 8/21 >> Carotid US 8/21: normal  // Echocardiogram 8/21: EF 55-60, no RWMA, normal RVSF, mild BAE, trivial MR, trivial AI    Vitamin B12 deficiency     Past Surgical History:  Procedure Laterality Date   CHOLECYSTECTOMY     COLONOSCOPY  2006   CORONARY STENT INTERVENTION N/A 08/05/2019   Procedure: CORONARY STENT INTERVENTION;  Surgeon: Corky Crafts, MD;  Location: [facility] INVASIVE CV LAB;  Service: Cardiovascular;  Laterality: N/A;   KNEE ARTHROSCOPY     right   LEFT HEART CATH AND CORONARY ANGIOGRAPHY N/A 08/05/2019   Procedure: LEFT HEART CATH AND CORONARY ANGIOGRAPHY;  Surgeon: Corky Crafts, MD;  Location: [facility] INVASIVE CV LAB;  Service: Cardiovascular;  Laterality: N/A;   LEFT HEART CATHETERIZATION WITH CORONARY ANGIOGRAM N/A 06/05/2014   Procedure: LEFT HEART CATHETERIZATION WITH CORONARY ANGIOGRAM;  Surgeon: Micheline Chapman, MD;   Location: [facility] CATH LAB;  Service: Cardiovascular;  Laterality: N/A;   LEFT HEART CATHETERIZATION WITH CORONARY ANGIOGRAM N/A 01/19/2015   Procedure: LEFT HEART CATHETERIZATION WITH CORONARY ANGIOGRAM;  Surgeon: Peter M Swaziland, MD;  Location: [facility] CATH LAB;  Service: Cardiovascular;  Laterality: N/A;   MELANOMA EXCISION  2011    L chest   MELANOMA EXCISION  1991   back   mRCA stent  02/2010   PROSTATECTOMY     Dr. Vernie Ammons   TONSILLECTOMY      Family History  Problem Relation Age of Onset   Coronary artery disease Mother        S/P CABG, in her 81s   Transient ischemic attack Mother 75   Heart disease Mother    Prostate cancer Father    Hypertension Brother    Diabetes Sister     Social History Social History   Tobacco Use   Smoking status: Never   Smokeless tobacco: Never  Vaping Use   Vaping status: Never Used  Substance Use Topics   Alcohol use: No    Alcohol/week: 0.0 standard drinks of alcohol   Drug use: No    Allergies  Allergen Reactions   Nitroglycerin Other (See Comments)    Pt will not take.  States he was in hospital and after taking second NTG SL pt states "I had to be shocked, I am scared of that medicine and will not take again".    Lidocaine Other (See Comments)    Syncope post palmer injection; probably vasovagal as no reaction to intraarticular Lidocaine into knee    Current Outpatient Medications  Medication Sig Dispense Refill   allopurinol (ZYLOPRIM) 100 MG tablet Take 1 tablet (100 mg total) by mouth daily. 90 tablet 3   calcium carbonate (TUMS - DOSED IN MG ELEMENTAL CALCIUM) 500 MG chewable tablet Chew 2 tablets by mouth daily as needed for indigestion or heartburn.      clopidogrel (PLAVIX) 75 MG tablet TAKE 1 TABLET BY MOUTH EVERY DAY WITH BREAKFAST 90 tablet 1   Cyanocobalamin (VITAMIN B12 PO) Take 1 tablet by mouth every evening.     diltiazem (CARDIZEM CD) 240 MG 24 hr capsule TAKE 1 CAPSULE BY MOUTH EVERY DAY 90 capsule 3   ELIQUIS 5 MG TABS  tablet TAKE 1 TABLET BY MOUTH TWICE A DAY 180 tablet 1   ezetimibe (ZETIA) 10 MG tablet TAKE 1 TABLET BY MOUTH EVERY DAY 90 tablet 3   fish oil-omega-3 fatty acids 1000 MG capsule Take 2 g by mouth 2 (two) times daily.       metoprolol tartrate (LOPRESSOR) 50 MG tablet TAKE 1 TABLET BY MOUTH TWICE A DAY 180 tablet 2   Misc Natural Products (TART CHERRY ADVANCED PO) Take 1,000 mg by mouth daily.     nitroGLYCERIN (NITROSTAT) 0.4 MG SL tablet Place 1 tablet (0.4 mg total) under the tongue every 5 (five) minutes x 3 doses as needed for chest pain. 25 tablet 4   Potassium Gluconate 2  MEQ TABS Take 1 tablet by mouth daily.     rosuvastatin (CRESTOR) 20 MG tablet Take 1 tablet (20 mg total) by mouth daily. 90 tablet 3   No current facility-administered medications for this visit.    Review of Systems  Constitutional: positive for fatigue Eyes: negative Ears, nose, mouth, throat, and face: negative Respiratory: negative Cardiovascular: negative Gastrointestinal: negative Genitourinary:negative Integument/breast: negative Hematologic/lymphatic: negative Musculoskeletal:negative Neurological: negative Behavioral/Psych: negative Endocrine: negative Allergic/Immunologic: negative  Physical Exam  WGN:FAOZH, healthy, no distress, well nourished, and well developed SKIN: skin color, texture, turgor are normal, no rashes or significant lesions HEAD: Normocephalic, No masses, lesions, tenderness or abnormalities EYES: normal, PERRLA, Conjunctiva are pink and non-injected EARS: External ears normal, Canals clear OROPHARYNX:no exudate and no erythema  NECK: supple, no adenopathy, no JVD LYMPH:  no palpable lymphadenopathy, no hepatosplenomegaly LUNGS: clear to auscultation , and palpation HEART: regular rate & rhythm, no murmurs, and no gallops ABDOMEN:abdomen soft, non-tender, normal bowel sounds, and no masses or organomegaly BACK: Back symmetric, no curvature., No CVA  tenderness EXTREMITIES:no joint deformities, effusion, or inflammation, no edema  NEURO: alert & oriented x 3 with fluent speech, no focal motor/sensory deficits  PERFORMANCE STATUS: ECOG 1  LABORATORY DATA: Lab Results  Component Value Date   WBC 4.9 08/18/2023   HGB 14.5 08/18/2023   HCT 41.9 08/18/2023   MCV 83.8 08/18/2023   PLT 113 (L) 08/18/2023      Chemistry      Component Value Date/Time   NA 138 08/18/2023 1122   NA 143 01/27/2023 0846   K 4.0 08/18/2023 1122   CL 103 08/18/2023 1122   CO2 30 08/18/2023 1122   BUN 16 08/18/2023 1122   BUN 16 01/27/2023 0846   CREATININE 1.70 (H) 08/18/2023 1122   CREATININE 1.53 (H) 02/07/2016 0745      Component Value Date/Time   CALCIUM 9.5 08/18/2023 1122   ALKPHOS 51 08/18/2023 1122   AST 28 08/18/2023 1122   ALT 30 08/18/2023 1122   BILITOT 0.8 08/18/2023 1122       RADIOGRAPHIC STUDIES: No results found.  ASSESSMENT AND PLAN: This is a very pleasant 77 years old white male presented for evaluation of monoclonal gammopathy of undetermined significance seen on recent blood work by his nephrologist. Assessment and Plan    Monoclonal Gammopathy of Undetermined Significance (MGUS) Abnormal protein (M spike of 0.7 g/dL) detected in serum protein electrophoresis. Free kappa light chain slightly elevated at 26.0 mg/L and free lambda light chain at 8.2 mg/L. No current symptoms. MGUS can progress to multiple myeloma at a rate of 1% per year. No immediate treatment required; monitoring is necessary. If blood work is suspicious, a bone marrow biopsy and aspirate may be needed. - Repeat myeloma panel, quantitative immunoglobulin, light chain, and blood count - Consider bone marrow biopsy and aspirate if results are suspicious - Schedule follow-up in six months - Provide information about MGUS  Chronic Kidney Disease (CKD) Stage 3B Elevated serum creatinine indicating poor kidney function. Diagnosed by nephrologist. No current  symptoms. - Continue monitoring kidney function - Follow-up with nephrologist as needed  Congestive Heart Failure (CHF) Well-managed CHF. No current symptoms. - Continue current management and medications - Monitor for symptoms of CHF exacerbation  Atrial Fibrillation Well-managed atrial fibrillation. No current symptoms. - Continue current management and medications - Monitor for symptoms of atrial fibrillation  Hypertension Well-controlled hypertension. No current symptoms. - Continue current antihypertensive medications - Monitor blood pressure regularly  Hyperlipidemia  Well-controlled hyperlipidemia. No current symptoms. - Continue current lipid-lowering medications - Monitor lipid levels regularly  History of Myocardial Infarction Well-managed post-myocardial infarction. No current symptoms. - Continue current cardiac medications - Monitor for symptoms of cardiac issues  History of Stroke Well-managed post-stroke. No current symptoms. - Continue current management and medications - Monitor for symptoms of stroke recurrence  History of Melanoma Well-managed post-melanoma. No current symptoms. - Continue regular dermatologic check-ups  History of Prostate Cancer Well-managed post-prostate cancer. No current symptoms. - Continue regular urologic check-ups  Vitamin B12 Deficiency Well-managed vitamin B12 deficiency. No current symptoms. - Continue current vitamin B12 supplementation - Monitor B12 levels regularly  General Health Maintenance None - Encourage regular exercise and a balanced diet - Avoid smoking and limit alcohol consumption - Regular follow-up appointments with primary care physician  Follow-up - Schedule follow-up appointment in six months - Contact if blood work results are concerning.   The patient was advised to call immediately if he has any other concerning symptoms in the interval.  The patient voices understanding of current disease  status and treatment options and is in agreement with the current care plan.  All questions were answered. The patient knows to call the clinic with any problems, questions or concerns. We can certainly see the patient much sooner if necessary.  Thank you so much for allowing me to participate in the care of [name]. I will continue to follow up the patient with you and assist in his care.  The total time spent in the appointment was 60 minutes.  Disclaimer: This note was dictated with voice recognition software. Similar sounding words can inadvertently be transcribed and may not be corrected upon review.   Lajuana Matte August 18, 2023, 12:17 PM

## 2023-08-19 LAB — KAPPA/LAMBDA LIGHT CHAINS
Kappa free light chain: 24.3 mg/L — ABNORMAL HIGH (ref 3.3–19.4)
Kappa, lambda light chain ratio: 3.42 — ABNORMAL HIGH (ref 0.26–1.65)
Lambda free light chains: 7.1 mg/L (ref 5.7–26.3)

## 2023-08-19 LAB — BETA 2 MICROGLOBULIN, SERUM: Beta-2 Microglobulin: 2.9 mg/L — ABNORMAL HIGH (ref 0.6–2.4)

## 2023-08-21 DIAGNOSIS — H524 Presbyopia: Secondary | ICD-10-CM | POA: Diagnosis not present

## 2023-08-21 DIAGNOSIS — H52223 Regular astigmatism, bilateral: Secondary | ICD-10-CM | POA: Diagnosis not present

## 2023-08-24 LAB — IMMUNOFIXATION ELECTROPHORESIS
IgA: 61 mg/dL (ref 61–437)
IgG (Immunoglobin G), Serum: 1042 mg/dL (ref 603–1613)
IgM (Immunoglobulin M), Srm: 17 mg/dL (ref 15–143)
Total Protein ELP: 6.4 g/dL (ref 6.0–8.5)

## 2023-08-27 ENCOUNTER — Encounter: Payer: Self-pay | Admitting: Pharmacist

## 2023-08-27 NOTE — Progress Notes (Signed)
Pharmacy Quality Measure Review  This patient is appearing on a report for being at risk of failing the adherence measure for cholesterol (statin) medications this calendar year.   Medication: Rosuvastatin Last fill date: 07/24/23 for 90 day supply  Insurance report was not up to date. No action needed at this time.   Arbutus Leas, PharmD, BCPS Clinical Pharmacist [facility] [phone]

## 2023-10-09 ENCOUNTER — Ambulatory Visit (INDEPENDENT_AMBULATORY_CARE_PROVIDER_SITE_OTHER): Payer: Medicare HMO

## 2023-10-09 ENCOUNTER — Telehealth: Payer: Self-pay

## 2023-10-09 VITALS — BP 100/62 | HR 61 | Ht 75.0 in | Wt 207.4 lb

## 2023-10-09 DIAGNOSIS — Z0000 Encounter for general adult medical examination without abnormal findings: Secondary | ICD-10-CM | POA: Diagnosis not present

## 2023-10-09 NOTE — Patient Instructions (Signed)
 [name] , Thank you for taking time to come for your Medicare Wellness Visit. I appreciate your ongoing commitment to your health goals. Please review the following plan we discussed and let me know if I can assist you in the future.   Referrals/Orders/Follow-Ups/Clinician Recommendations: Keep up the good work.    This is a list of the screening recommended for you and due dates:  Health Maintenance  Topic Date Due   Zoster (Shingles) Vaccine (1 of 2) Never done   COVID-19 Vaccine (3 - Pfizer risk series) 01/31/2020   Colon Cancer Screening  10/27/2022   Flu Shot  Never done   Pneumonia Vaccine (1 of 2 - PCV) 10/22/2023*   Medicare Annual Wellness Visit  10/08/2024   DTaP/Tdap/Td vaccine (3 - Td or Tdap) 02/17/2033   Hepatitis C Screening  Completed   HPV Vaccine  Aged Out  *Topic was postponed. The date shown is not the original due date.    Advanced directives: (Provided) Advance directive discussed with you today. I have provided a copy for you to complete at home and have notarized. Once this is complete, please bring a copy in to our office so we can scan it into your chart.   Next Medicare Annual Wellness Visit scheduled for next year: Yes

## 2023-10-09 NOTE — Telephone Encounter (Signed)
 Patient has had a cough x 2 weeks and began to have a sore chest when he coughs x 2 days.  Patient has been taking Halls to control cough.  He has an appointment with Dr. Juanita 10/21/22, however he did not want to wait to address the cough until then.  Please see message and advise.  Thank you.

## 2023-10-09 NOTE — Progress Notes (Signed)
 Subjective:   [name] is a 78 y.o. male who presents for Medicare Annual/Subsequent preventive examination.  Visit Complete: In person    Cardiac Risk Factors include: advanced age (>49men, >37 women);male gender;hypertension;Other (see comment);dyslipidemia, Risk factor comments: CAD, TIA, CKD,     Objective:    Today's Vitals   10/09/23 1204  BP: 100/62  Pulse: 61  SpO2: 99%  Weight: 207 lb 6.4 oz (94.1 kg)  Height: 6' 3 (1.905 m)   Body mass index is 25.92 kg/m.     10/09/2023   10:59 AM 02/18/2023    8:49 PM 03/21/2022   10:33 AM 08/04/2019    7:00 PM 08/04/2019    8:06 AM 01/18/2015    2:36 PM 06/03/2014    9:13 PM  Advanced Directives  Does Patient Have a Medical Advance Directive? No No No No No No No  Does patient want to make changes to medical advance directive?   No - Patient declined      Would patient like information on creating a medical advance directive? Yes (MAU/Ambulatory/Procedural Areas - Information given)   No - Patient declined No - Patient declined No - patient declined information No - patient declined information    Current Medications (verified) Outpatient Encounter Medications as of 10/09/2023  Medication Sig   allopurinol  (ZYLOPRIM ) 100 MG tablet Take 1 tablet (100 mg total) by mouth daily.   calcium  carbonate (TUMS - DOSED IN MG ELEMENTAL CALCIUM ) 500 MG chewable tablet Chew 2 tablets by mouth daily as needed for indigestion or heartburn.    clopidogrel  (PLAVIX ) 75 MG tablet TAKE 1 TABLET BY MOUTH EVERY DAY WITH BREAKFAST   Cyanocobalamin  (VITAMIN B12 PO) Take 1 tablet by mouth every evening.   diltiazem  (CARDIZEM  CD) 240 MG 24 hr capsule TAKE 1 CAPSULE BY MOUTH EVERY DAY   ELIQUIS  5 MG TABS tablet TAKE 1 TABLET BY MOUTH TWICE A DAY   ezetimibe  (ZETIA ) 10 MG tablet TAKE 1 TABLET BY MOUTH EVERY DAY   fish oil-omega-3 fatty acids 1000 MG capsule Take 2 g by mouth 2 (two) times daily.     metoprolol  tartrate (LOPRESSOR ) 50 MG tablet TAKE  1 TABLET BY MOUTH TWICE A DAY   Misc Natural Products (TART CHERRY ADVANCED PO) Take 1,000 mg by mouth daily.   nitroGLYCERIN  (NITROSTAT ) 0.4 MG SL tablet Place 1 tablet (0.4 mg total) under the tongue every 5 (five) minutes x 3 doses as needed for chest pain.   Potassium Gluconate 2 MEQ TABS Take 1 tablet by mouth daily.   rosuvastatin  (CRESTOR ) 20 MG tablet Take 1 tablet (20 mg total) by mouth daily.   No facility-administered encounter medications on file as of 10/09/2023.    Allergies (verified) Nitroglycerin  and Lidocaine    History: Past Medical History:  Diagnosis Date   Anemia, mild    CAD (coronary artery disease)    a. 02/2010 NSTEMI/PCI: PROMUS DES to mRCA. b. 06/05/14 Cath/PCI: pLAD 70%--> s/p PCI/DES (Promus DES);  c. 01/2015 Cath/PCI: LM nl, LAD 52m, patent prox stent, LCX patent prox stent, RCA 30p, 47m (3.5x20 Synergy DES). // Myoview  07/2019:  EF 48, atten artifact, no ischemia, intermediate risk (low EF)    CKD (chronic kidney disease), stage III (HCC)    Diastolic dysfunction    a. Echo 6/11: mild LVH, EF 55-60%, GR1DD, Trivial MR, mild LAE. b. echo 06/04/14: EF 55-60%, no WMA, GR1DD, Ao valve mildly calcifed, mild MR, mild LAE   History of pulmonary embolism  Hyperlipidemia    Hypertension    did not tolerate Lisinopril event at low dose   Melanoma (HCC)    a. 1987. b. 2012   Myocardial infarction (HCC)    Persistent atrial fibrillation (HCC) 12/03/2018   Dx 11/2018 >> Apixaban  started // Echo 12/2018: EF 55-60, mod RVE, normal RVSF, mild LAE, trivial MR, mild TR, mod AoV sclerosis    Prostate cancer (HCC)    Dr Ottelin   Stroke [facility])    TIA (transient ischemic attack)    a. post cath and PCI on 06/05/2014 - no residual sequelae. // ?recurrent TIA 8/21 >> Carotid US  8/21: normal  // Echocardiogram 8/21: EF 55-60, no RWMA, normal RVSF, mild BAE, trivial MR, trivial AI    Vitamin B12 deficiency    Past Surgical History:  Procedure Laterality Date   CHOLECYSTECTOMY      COLONOSCOPY  2006   CORONARY STENT INTERVENTION N/A 08/05/2019   Procedure: CORONARY STENT INTERVENTION;  Surgeon: Dann Candyce RAMAN, MD;  Location: [facility] INVASIVE CV LAB;  Service: Cardiovascular;  Laterality: N/A;   KNEE ARTHROSCOPY     right   LEFT HEART CATH AND CORONARY ANGIOGRAPHY N/A 08/05/2019   Procedure: LEFT HEART CATH AND CORONARY ANGIOGRAPHY;  Surgeon: Dann Candyce RAMAN, MD;  Location: [facility] INVASIVE CV LAB;  Service: Cardiovascular;  Laterality: N/A;   LEFT HEART CATHETERIZATION WITH CORONARY ANGIOGRAM N/A 06/05/2014   Procedure: LEFT HEART CATHETERIZATION WITH CORONARY ANGIOGRAM;  Surgeon: Ozell JONETTA Fell, MD;  Location: [facility] CATH LAB;  Service: Cardiovascular;  Laterality: N/A;   LEFT HEART CATHETERIZATION WITH CORONARY ANGIOGRAM N/A 01/19/2015   Procedure: LEFT HEART CATHETERIZATION WITH CORONARY ANGIOGRAM;  Surgeon: Peter M Jordan, MD;  Location: [facility] CATH LAB;  Service: Cardiovascular;  Laterality: N/A;   MELANOMA EXCISION  2011    L chest   MELANOMA EXCISION  1991   back   mRCA stent  02/2010   PROSTATECTOMY     Dr. Ceil   TONSILLECTOMY     Family History  Problem Relation Age of Onset   Coronary artery disease Mother        S/P CABG, in her 28s   Transient ischemic attack Mother 44   Heart disease Mother    Prostate cancer Father    Hypertension Brother    Diabetes Sister    Social History   Socioeconomic History   Marital status: Married    Spouse name: Arlean   Number of children: 2   Years of education: Not on file   Highest education level: Not on file  Occupational History   Occupation: Retired  Tobacco Use   Smoking status: Never   Smokeless tobacco: Never  Vaping Use   Vaping status: Never Used  Substance and Sexual Activity   Alcohol use: No    Alcohol/week: 0.0 standard drinks of alcohol   Drug use: No   Sexual activity: Not on file  Other Topics Concern   Not on file  Social History Narrative   Right handed    Caffeine- 2-3 cups per day     Lives with wife   Social Drivers of Corporate Investment Banker Strain: Low Risk  (10/09/2023)   Overall Financial Resource Strain (CARDIA)    Difficulty of Paying Living Expenses: Not hard at all  Food Insecurity: No Food Insecurity (10/09/2023)   Hunger Vital Sign    Worried About Running Out of Food in the Last Year: Never true    Ran Out of Food  in the Last Year: Never true  Transportation Needs: No Transportation Needs (10/09/2023)   PRAPARE - Administrator, Civil Service (Medical): No    Lack of Transportation (Non-Medical): No  Physical Activity: Inactive (10/09/2023)   Exercise Vital Sign    Days of Exercise per Week: 0 days    Minutes of Exercise per Session: 0 min  Stress: No Stress Concern Present (10/09/2023)   [employer] of Occupational Health - Occupational Stress Questionnaire    Feeling of Stress : Not at all  Social Connections: Moderately Isolated (10/09/2023)   Social Connection and Isolation Panel [NHANES]    Frequency of Communication with Friends and Family: Twice a week    Frequency of Social Gatherings with Friends and Family: Twice a week    Attends Religious Services: Never    Database Administrator or Organizations: No    Attends Engineer, Structural: Never    Marital Status: Married    Tobacco Counseling Counseling given: Not Answered   Clinical Intake:  Pre-visit preparation completed: Yes  Pain : No/denies pain     BMI - recorded: 25.92 Nutritional Status: BMI 25 -29 Overweight Nutritional Risks: None Diabetes: No  How often do you need to have someone help you when you read instructions, pamphlets, or other written materials from your doctor or pharmacy?: 1 - Never  Interpreter Needed?: No  Information entered by :: York Valliant, RMA   Activities of Daily Living    10/09/2023   10:54 AM  In your present state of health, do you have any difficulty performing the following activities:  Hearing? 0  Vision? 0   Difficulty concentrating or making decisions? 0  Walking or climbing stairs? 0  Dressing or bathing? 0  Doing errands, shopping? 0  Preparing Food and eating ? N  Using the Toilet? N  In the past six months, have you accidently leaked urine? N  Do you have problems with loss of bowel control? N  Managing your Medications? N  Managing your Finances? N  Housekeeping or managing your Housekeeping? N    Patient Care Team: Geofm Glade PARAS, MD as PCP - General (Internal Medicine) Nahser, Aleene PARAS, MD as PCP - Cardiology (Cardiology) Camillo Golas, MD as Attending Physician (Ophthalmology)  Indicate any recent Medical Services you may have received from other than [facility] providers in the past year (date may be approximate).     Assessment:   This is a routine wellness examination for Jawan.  Hearing/Vision screen Hearing Screening - Comments:: Denies hearing difficulties   Vision Screening - Comments:: Wears eyeglasses   Goals Addressed   None   Depression Screen    10/09/2023   11:13 AM 10/21/2022   10:39 AM 10/21/2022   10:38 AM 03/21/2022   10:34 AM 03/21/2022   10:31 AM 07/26/2021    5:13 PM 01/27/2020    2:42 PM  PHQ 2/9 Scores  PHQ - 2 Score 3 0 0 0 0 3 0  PHQ- 9 Score 4 0    7     Fall Risk    10/09/2023   11:00 AM 10/21/2022   10:37 AM 03/21/2022   10:33 AM 07/26/2021    2:29 PM 01/27/2020    2:41 PM  Fall Risk   Falls in the past year? 1 0 0 0 0  Number falls in past yr: 1 0 0 0 0  Injury with Fall? 0 0 0 0 0  Risk for fall due  to :  No Fall Risks  No Fall Risks   Follow up Falls evaluation completed;Falls prevention discussed Falls evaluation completed Falls evaluation completed;Education provided Falls evaluation completed     MEDICARE RISK AT HOME: Medicare Risk at Home Any stairs in or around the home?: Yes (2 steps) If so, are there any without handrails?: No Home free of loose throw rugs in walkways, pet beds, electrical cords, etc?: Yes Adequate lighting  in your home to reduce risk of falls?: Yes Life alert?: No Use of a cane, walker or w/c?: No Grab bars in the bathroom?: No Shower chair or bench in shower?: No Elevated toilet seat or a handicapped toilet?: No  TIMED UP AND GO:  Was the test performed?  Yes  Length of time to ambulate 10 feet: 20 sec Gait slow and steady without use of assistive device    Cognitive Function:    11/05/2015   10:19 AM  MMSE - Mini Mental State Exam  Not completed: Refused        10/09/2023   11:04 AM  6CIT Screen  What Year? 0 points  What month? 0 points  What time? 0 points  Count back from 20 0 points  Months in reverse 4 points  Repeat phrase 4 points  Total Score 8 points    Immunizations Immunization History  Administered Date(s) Administered   PFIZER(Purple Top)SARS-COV-2 Vaccination 12/08/2019, 01/03/2020   Td 06/14/2010   Tdap 02/18/2023    TDAP status: Up to date  Flu Vaccine status: Declined, Education has been provided regarding the importance of this vaccine but patient still declined. Advised may receive this vaccine at local pharmacy or Health Dept. Aware to provide a copy of the vaccination record if obtained from local pharmacy or Health Dept. Verbalized acceptance and understanding.  Pneumococcal vaccine status: Declined,  Education has been provided regarding the importance of this vaccine but patient still declined. Advised may receive this vaccine at local pharmacy or Health Dept. Aware to provide a copy of the vaccination record if obtained from local pharmacy or Health Dept. Verbalized acceptance and understanding.   Covid-19 vaccine status: Declined, Education has been provided regarding the importance of this vaccine but patient still declined. Advised may receive this vaccine at local pharmacy or Health Dept.or vaccine clinic. Aware to provide a copy of the vaccination record if obtained from local pharmacy or Health Dept. Verbalized acceptance and  understanding.  Qualifies for Shingles Vaccine? Yes   Zostavax completed No   Shingrix Completed?: No.    Education has been provided regarding the importance of this vaccine. Patient has been advised to call insurance company to determine out of pocket expense if they have not yet received this vaccine. Advised may also receive vaccine at local pharmacy or Health Dept. Verbalized acceptance and understanding.  Screening Tests Health Maintenance  Topic Date Due   Zoster Vaccines- Shingrix (1 of 2) Never done   COVID-19 Vaccine (3 - Pfizer risk series) 01/31/2020   Colonoscopy  10/27/2022   INFLUENZA VACCINE  Never done   Pneumonia Vaccine 56+ Years old (1 of 2 - PCV) 10/22/2023 (Originally 04/26/1952)   Medicare Annual Wellness (AWV)  10/08/2024   DTaP/Tdap/Td (3 - Td or Tdap) 02/17/2033   Hepatitis C Screening  Completed   HPV VACCINES  Aged Out    Health Maintenance  Health Maintenance Due  Topic Date Due   Zoster Vaccines- Shingrix (1 of 2) Never done   COVID-19 Vaccine (3 - Pfizer risk  series) 01/31/2020   Colonoscopy  10/27/2022   INFLUENZA VACCINE  Never done    Colorectal cancer screening: No longer required.   Lung Cancer Screening: (Low Dose CT Chest recommended if Age 87-80 years, 20 pack-year currently smoking OR have quit w/in 15years.) does not qualify.   Lung Cancer Screening Referral: N/A  Additional Screening:  Hepatitis C Screening: does qualify; Completed 08/04/2016  Vision Screening: Recommended annual ophthalmology exams for early detection of glaucoma and other disorders of the eye. Is the patient up to date with their annual eye exam?  Yes  Who is the provider or what is the name of the office in which the patient attends annual eye exams? Dr. Camillo If pt is not established with a provider, would they like to be referred to a provider to establish care? No .   Dental Screening: Recommended annual dental exams for proper oral hygiene   Community  Resource Referral / Chronic Care Management: CRR required this visit?  No   CCM required this visit?  No     Plan:     I have personally reviewed and noted the following in the patient's chart:   Medical and social history Use of alcohol, tobacco or illicit drugs  Current medications and supplements including opioid prescriptions. Patient is not currently taking opioid prescriptions. Functional ability and status Nutritional status Physical activity Advanced directives List of other physicians Hospitalizations, surgeries, and ER visits in previous 12 months Vitals Screenings to include cognitive, depression, and falls Referrals and appointments  In addition, I have reviewed and discussed with patient certain preventive protocols, quality metrics, and best practice recommendations. A written personalized care plan for preventive services as well as general preventive health recommendations were provided to patient.     Colbi Schiltz L Aqeel Norgaard, CMA   10/09/2023   After Visit Summary: (In Person-Printed) AVS printed and given to the patient  Nurse Notes: Patient declines all vaccines due.  He also declines colonoscopy.  Patient stated that he has had a cough x 2 weeks now.  For the last 2 days he has begun to have soreness with his cough.  Patient has been taking Halls cough drops to suppress the cough.  I did send a message to DOD and was informed patient -need an office visit to evaluate further or urgent care if worsening.  There were no opening at this office today.  Patient stated that he will go to urgent care if worsen.  He had no other concerns to address.

## 2023-10-16 ENCOUNTER — Ambulatory Visit: Payer: Self-pay | Admitting: Internal Medicine

## 2023-10-16 ENCOUNTER — Encounter (HOSPITAL_COMMUNITY): Payer: Self-pay

## 2023-10-16 ENCOUNTER — Ambulatory Visit (INDEPENDENT_AMBULATORY_CARE_PROVIDER_SITE_OTHER): Payer: Medicare HMO

## 2023-10-16 ENCOUNTER — Ambulatory Visit (HOSPITAL_COMMUNITY)
Admission: RE | Admit: 2023-10-16 | Discharge: 2023-10-16 | Disposition: A | Discharge status: Home / Self Care | Payer: Medicare HMO | Source: Ambulatory Visit | Attending: Emergency Medicine | Admitting: Emergency Medicine

## 2023-10-16 VITALS — BP 109/73 | HR 78 | Temp 98.0°F | Resp 20

## 2023-10-16 DIAGNOSIS — R058 Other specified cough: Secondary | ICD-10-CM | POA: Diagnosis not present

## 2023-10-16 DIAGNOSIS — J189 Pneumonia, unspecified organism: Secondary | ICD-10-CM | POA: Diagnosis not present

## 2023-10-16 DIAGNOSIS — R059 Cough, unspecified: Secondary | ICD-10-CM | POA: Diagnosis not present

## 2023-10-16 MED ORDER — AZITHROMYCIN 250 MG PO TABS: ORAL_TABLET | ORAL | 0 refills | Status: AC

## 2023-10-16 MED ORDER — PROMETHAZINE-DM 6.25-15 MG/5ML PO SYRP: 5.0000 mL | ORAL_SOLUTION | Freq: Every evening | ORAL | 0 refills | Status: DC | PRN

## 2023-10-16 MED ORDER — GUAIFENESIN 400 MG PO TABS: ORAL_TABLET | ORAL | 0 refills | Status: DC

## 2023-10-16 MED ORDER — AMOXICILLIN-POT CLAVULANATE 875-125 MG PO TABS: 1.0000 | ORAL_TABLET | Freq: Two times a day (BID) | ORAL | 0 refills | Status: AC

## 2023-10-16 NOTE — Telephone Encounter (Signed)
 Chief Complaint: Cough Symptoms: Cough, runny nose with yellow discharge, soreness in chest Frequency: constant for 3 weeks Pertinent Negatives: Patient denies fever Disposition: [] ED /[x] Urgent Care (no appt availability in office) / [] Appointment(In office/virtual)/ []  [facility]/ [] Home Care/ [] Refused Recommended Disposition /[] [facility]/ []  Follow-up with PCP Additional Notes: Patient's wife called in stating patient has been experiencing a cough for roughly 3 weeks that is not resolving. Patient's wife is concerned about any infection setting in. Patient and wife stated patient has been taking cough medication but would like to be evaluated due to nasal discharge that is yellow and no improvement in symptoms. Per protocol, advised patient to be seen by HCP within 3 days. No office availability at this time, scheduled patient UC appointment for this afternoon.    Copied from CRM [id number]. Topic: Clinical - Medical Advice >> Oct 16, 2023 12:19 PM Mercedes MATSU wrote: Reason for CRM: Patients wife called in asking if the patient can be prescribed coughing pearls. Patients wife says that the patients cough is getting worse and is asking for a call back as well as a prescription. Reason for Disposition  Cough has been present for > 3 weeks  Answer Assessment - Initial Assessment Questions 1. ONSET: When did the cough begin?      3 weeks ago 3. SPUTUM: Describe the color of your sputum (none, dry cough; clear, white, yellow, green)     Yes, green 4. HEMOPTYSIS: Are you coughing up any blood? If so ask: How much? (flecks, streaks, tablespoons, etc.)     No 5. DIFFICULTY BREATHING: Are you having difficulty breathing? If Yes, ask: How bad is it? (e.g., mild, moderate, severe)    - MILD: No SOB at rest, mild SOB with walking, speaks normally in sentences, can lie down, no retractions, pulse < 100.    - MODERATE: SOB at rest, SOB with minimal exertion and prefers  to sit, cannot lie down flat, speaks in phrases, mild retractions, audible wheezing, pulse 100-120.    - SEVERE: Very SOB at rest, speaks in single words, struggling to breathe, sitting hunched forward, retractions, pulse > 120      Not more than usual 6. FEVER: Do you have a fever? If Yes, ask: What is your temperature, how was it measured, and when did it start?     No 7. CARDIAC HISTORY: Do you have any history of heart disease? (e.g., heart attack, congestive heart failure)      Has had stents placed, heart attack, stroke 8. LUNG HISTORY: Do you have any history of lung disease?  (e.g., pulmonary embolus, asthma, emphysema)     Pulmonary Embolism left lung 20 years ago 9. PE RISK FACTORS: Do you have a history of blood clots? (or: recent major surgery, recent prolonged travel, bedridden)     P/E roughly 20 years ago 10. OTHER SYMPTOMS: Do you have any other symptoms? (e.g., runny nose, wheezing, chest pain)       Nose bleed,  Protocols used: Cough - Acute Productive-A-AH

## 2023-10-16 NOTE — Discharge Instructions (Addendum)
 The radiologist reviewed your chest x-ray was not impressed that you have any signs of pneumonia however per my personal read, I believe that you are developing pneumonia in your left upper lobe.  This reason, I believe that you need to be treated with antibiotics.  Please read below to learn more about the medications, dosages and frequencies that I recommend to help alleviate your symptoms and to get you feeling better soon:   Augmentin  (amoxicillin  - clavulanic acid):  Please take one (1) dose twice daily for 5 days.  This antibiotic can cause upset stomach, this will resolve once antibiotics are complete.  You are welcome to take a probiotic, eat yogurt, take Imodium while taking this medication.  Please avoid other systemic medications such as Maalox, Pepto-Bismol or milk of magnesia as they can interfere with the body's ability to absorb the antibiotics.    Z-Pak (azithromycin ):  Please take two (2) tablets on day one and one tablet daily thereafter until the prescription is complete.This antibiotic can cause upset stomach, this will resolve once antibiotics are complete.  You are welcome to take a probiotic, eat yogurt, take Imodium while taking this medication.  Please avoid other systemic medications such as Maalox, Pepto-Bismol or milk of magnesia as they can interfere with the body's ability to absorb the antibiotics.   Robitussin, Mucinex  (guaifenesin ): This is a daytime expectorant.  This single symptom reliever helps break up chest congestion and loosen up thick nasal drainage making phlegm and drainage easier to cough up and to blow out from your nose.  I recommend taking 400 mg in either liquid or tablet form three times daily as needed.  I do not recommend the 12-hour extended relief version or doses higher than 400 mg per each dose as these often make some patients feel jittery or jumpy and can interfere with sleep.  I also do not recommend that you purchase guaifenesin  with the  ingredient  DM which is dextromethorphan, a cough suppressant which I only recommend taking at bedtime.  Guaifenesin  400 mg is a safe dose for people who are being treated for high blood pressure.     Promethazine  DM: Promethazine  is both a nasal decongestant that dries up mucous membranes and an antinausea medication.  Promethazine  often makes most patients feel fairly sleepy.  DM is dextromethorphan, a single symptom reliever which is a cough suppressant found in many over-the-counter cough medications and combination cold preparations.  Please take 5 mL before bedtime to minimize your cough which will help you sleep better.  I have sent a prescription for this medication to your pharmacy because it cannot be purchased over-the-counter.   If symptoms have not meaningfully improved in the next 5 to 7 days, please return for repeat evaluation or follow-up with your regular provider.  If symptoms have worsened in the next 3 to 5 days, please return for repeat evaluation or follow-up with your regular provider.    Thank you for visiting urgent care today.  We appreciate the opportunity to participate in your care.

## 2023-10-16 NOTE — ED Triage Notes (Signed)
 Pt c/o productive cough for 3wks with green sputum and chest congestion. States been taking cough syrup and cough drops with little relief.

## 2023-10-16 NOTE — ED Provider Notes (Signed)
 [facility]    CSN: [id number] Arrival date & time: 10/16/23  1513    HISTORY   Chief Complaint  Patient presents with   Cough    Cough with yellow nasal discharge - Entered by patient   HPI [name] is a pleasant, 78 y.o. male who presents to urgent care today. Patient reports a 3-week history of cough productive of green sputum and feeling congested in his chest.  Patient denies fever, sore throat, body aches, shortness of breath, history of COPD, history of allergies, history of pneumonia.  Patient endorses a very remote history of smoking.  Patient's vital signs are normal on arrival today.  The history is provided by the patient.   Past Medical History:  Diagnosis Date   Anemia, mild    CAD (coronary artery disease)    a. 02/2010 NSTEMI/PCI: PROMUS DES to mRCA. b. 06/05/14 Cath/PCI: pLAD 70%--> s/p PCI/DES (Promus DES);  c. 01/2015 Cath/PCI: LM nl, LAD 78m, patent prox stent, LCX patent prox stent, RCA 30p, 65m (3.5x20 Synergy DES). // Myoview  07/2019:  EF 48, atten artifact, no ischemia, intermediate risk (low EF)    CKD (chronic kidney disease), stage III (HCC)    Diastolic dysfunction    a. Echo 6/11: mild LVH, EF 55-60%, GR1DD, Trivial MR, mild LAE. b. echo 06/04/14: EF 55-60%, no WMA, GR1DD, Ao valve mildly calcifed, mild MR, mild LAE   History of pulmonary embolism    Hyperlipidemia    Hypertension    did not tolerate Lisinopril event at low dose   Melanoma (HCC)    a. 1987. b. 2012   Myocardial infarction (HCC)    Persistent atrial fibrillation (HCC) 12/03/2018   Dx 11/2018 >> Apixaban  started // Echo 12/2018: EF 55-60, mod RVE, normal RVSF, mild LAE, trivial MR, mild TR, mod AoV sclerosis    Prostate cancer (HCC)    Dr Ceil   Stroke [facility])    TIA (transient ischemic attack)    a. post cath and PCI on 06/05/2014 - no residual sequelae. // ?recurrent TIA 8/21 >> Carotid US  8/21: normal  // Echocardiogram 8/21: EF 55-60, no RWMA, normal RVSF, mild BAE,  trivial MR, trivial AI    Vitamin B12 deficiency    Patient Active Problem List   Diagnosis Date Noted   Hemoptysis 03/25/2023   Anticoagulant long-term use 03/25/2023   Personal history of COVID-19 03/25/2023   Fatigue 03/25/2023   Abnormal findings on diagnostic imaging of lung 03/25/2023   Bilateral hearing loss 07/26/2021   COVID 06/05/2021   LLQ pain 08/10/2020   Confusion 05/17/2020   Erysipelas 09/12/2019   Gouty arthritis of left great toe 08/02/2019   Persistent atrial fibrillation (HCC), rapid ventricular response 12/03/2018   Umbilical hernia without obstruction or gangrene 09/14/2018   B12 deficiency 09/14/2018   Erectile dysfunction 08/04/2016   Elevated TSH 08/29/2015   S/P coronary artery stent placement, 01/19/15 S/p DES of mid RCA 01/20/2015   Prediabetes 10/03/2014   TIA (transient ischemic attack) 07/31/2014   NSTEMI (non-ST elevated myocardial infarction) (HCC) 06/04/2014   Chronic kidney disease (CKD), stage III (moderate) (HCC) 06/14/2010   Coronary artery disease involving native coronary artery of native heart without angina pectoris 03/01/2010   Benign essential tremor 04/24/2009   PROSTATE CANCER, HX OF 04/24/2009   History of melanoma 04/24/2009   Hyperlipidemia LDL goal <70 10/28/2007   Essential hypertension 10/13/2007   Past Surgical History:  Procedure Laterality Date   CHOLECYSTECTOMY  COLONOSCOPY  2006   CORONARY STENT INTERVENTION N/A 08/05/2019   Procedure: CORONARY STENT INTERVENTION;  Surgeon: Dann Candyce RAMAN, MD;  Location: [facility] INVASIVE CV LAB;  Service: Cardiovascular;  Laterality: N/A;   KNEE ARTHROSCOPY     right   LEFT HEART CATH AND CORONARY ANGIOGRAPHY N/A 08/05/2019   Procedure: LEFT HEART CATH AND CORONARY ANGIOGRAPHY;  Surgeon: Dann Candyce RAMAN, MD;  Location: [facility] INVASIVE CV LAB;  Service: Cardiovascular;  Laterality: N/A;   LEFT HEART CATHETERIZATION WITH CORONARY ANGIOGRAM N/A 06/05/2014   Procedure: LEFT HEART  CATHETERIZATION WITH CORONARY ANGIOGRAM;  Surgeon: Ozell JONETTA Fell, MD;  Location: [facility] CATH LAB;  Service: Cardiovascular;  Laterality: N/A;   LEFT HEART CATHETERIZATION WITH CORONARY ANGIOGRAM N/A 01/19/2015   Procedure: LEFT HEART CATHETERIZATION WITH CORONARY ANGIOGRAM;  Surgeon: Peter M Jordan, MD;  Location: [facility] CATH LAB;  Service: Cardiovascular;  Laterality: N/A;   MELANOMA EXCISION  2011    L chest   MELANOMA EXCISION  1991   back   mRCA stent  02/2010   PROSTATECTOMY     Dr. Ottelin   TONSILLECTOMY      Home Medications    Prior to Admission medications   Medication Sig Start Date End Date Taking? Authorizing Provider  allopurinol  (ZYLOPRIM ) 100 MG tablet Take 1 tablet (100 mg total) by mouth daily. 10/21/22   Geofm Glade PARAS, MD  calcium  carbonate (TUMS - DOSED IN MG ELEMENTAL CALCIUM ) 500 MG chewable tablet Chew 2 tablets by mouth daily as needed for indigestion or heartburn.     [provider]  clopidogrel  (PLAVIX ) 75 MG tablet TAKE 1 TABLET BY MOUTH EVERY DAY WITH BREAKFAST 05/07/23   Nahser, Aleene PARAS, MD  Cyanocobalamin  (VITAMIN B12 PO) Take 1 tablet by mouth every evening.    [provider]  diltiazem  (CARDIZEM  CD) 240 MG 24 hr capsule TAKE 1 CAPSULE BY MOUTH EVERY DAY 12/29/22   Nahser, Aleene PARAS, MD  ELIQUIS  5 MG TABS tablet TAKE 1 TABLET BY MOUTH TWICE A DAY 02/10/23   Nahser, Aleene PARAS, MD  ezetimibe  (ZETIA ) 10 MG tablet TAKE 1 TABLET BY MOUTH EVERY DAY 12/29/22   Nahser, Aleene PARAS, MD  fish oil-omega-3 fatty acids 1000 MG capsule Take 2 g by mouth 2 (two) times daily.      [provider]  metoprolol  tartrate (LOPRESSOR ) 50 MG tablet TAKE 1 TABLET BY MOUTH TWICE A DAY 11/03/22   Nahser, Aleene PARAS, MD  Misc Natural Products (TART CHERRY ADVANCED PO) Take 1,000 mg by mouth daily.    [provider]  nitroGLYCERIN  (NITROSTAT ) 0.4 MG SL tablet Place 1 tablet (0.4 mg total) under the tongue every 5 (five) minutes x 3 doses as needed for chest pain.  08/06/19   Marylu Leita SAUNDERS, NP  Potassium Gluconate 2 MEQ TABS Take 1 tablet by mouth daily.    [provider]  rosuvastatin  (CRESTOR ) 20 MG tablet Take 1 tablet (20 mg total) by mouth daily. 10/17/22   Swinyer, Rosaline HERO, NP    Family History Family History  Problem Relation Age of Onset   Coronary artery disease Mother        S/P CABG, in her 60s   Transient ischemic attack Mother 73   Heart disease Mother    Prostate cancer Father    Hypertension Brother    Diabetes Sister    Social History Social History   Tobacco Use   Smoking status: Never   Smokeless tobacco: Never  Vaping  Use   Vaping status: Never Used  Substance Use Topics   Alcohol use: No    Alcohol/week: 0.0 standard drinks of alcohol   Drug use: No   Allergies   Nitroglycerin  and Lidocaine   Review of Systems Review of Systems Pertinent findings revealed after performing a 14 point review of systems has been noted in the history of present illness.  Physical Exam Vital Signs BP 109/73 (BP Location: Right Arm)   Pulse 78   Temp 98 F (36.7 C) (Oral)   Resp 20   SpO2 97%   No data found.  Physical Exam Vitals and nursing note reviewed.  Constitutional:      General: He is not in acute distress.    Appearance: Normal appearance. He is not ill-appearing.  HENT:     Head: Normocephalic and atraumatic.     Salivary Glands: Right salivary gland is not diffusely enlarged or tender. Left salivary gland is not diffusely enlarged or tender.     Right Ear: Tympanic membrane, ear canal and external ear normal. No drainage. No middle ear effusion. There is no impacted cerumen. Tympanic membrane is not erythematous or bulging.     Left Ear: Tympanic membrane, ear canal and external ear normal. No drainage.  No middle ear effusion. There is no impacted cerumen. Tympanic membrane is not erythematous or bulging.     Nose: Nose normal. No nasal deformity, septal deviation, mucosal edema, congestion or  rhinorrhea.     Right Turbinates: Not enlarged, swollen or pale.     Left Turbinates: Not enlarged, swollen or pale.     Right Sinus: No maxillary sinus tenderness or frontal sinus tenderness.     Left Sinus: No maxillary sinus tenderness or frontal sinus tenderness.     Mouth/Throat:     Lips: Pink. No lesions.     Mouth: Mucous membranes are moist. No oral lesions.     Pharynx: Oropharynx is clear. Uvula midline. No posterior oropharyngeal erythema or uvula swelling.     Tonsils: No tonsillar exudate. 0 on the right. 0 on the left.  Eyes:     General: Lids are normal.        Right eye: No discharge.        Left eye: No discharge.     Extraocular Movements: Extraocular movements intact.     Conjunctiva/sclera: Conjunctivae normal.     Right eye: Right conjunctiva is not injected.     Left eye: Left conjunctiva is not injected.  Neck:     Trachea: Trachea and phonation normal.  Cardiovascular:     Rate and Rhythm: Normal rate and regular rhythm.     Pulses: Normal pulses.     Heart sounds: Normal heart sounds. No murmur heard.    No friction rub. No gallop.  Pulmonary:     Effort: Pulmonary effort is normal. No accessory muscle usage, prolonged expiration or respiratory distress.     Breath sounds: No stridor, decreased air movement or transmitted upper airway sounds. Examination of the left-middle field reveals decreased breath sounds and rales. Examination of the left-lower field reveals rales. Decreased breath sounds and rales present. No wheezing or rhonchi.  Chest:     Chest wall: No tenderness.  Musculoskeletal:        General: Normal range of motion.     Cervical back: Normal range of motion and neck supple. Normal range of motion.  Lymphadenopathy:     Cervical: No cervical adenopathy.  Skin:  General: Skin is warm and dry.     Findings: No erythema or rash.  Neurological:     General: No focal deficit present.     Mental Status: He is alert and oriented to person,  place, and time.  Psychiatric:        Mood and Affect: Mood normal.        Behavior: Behavior normal.     Visual Acuity Right Eye Distance:   Left Eye Distance:   Bilateral Distance:    Right Eye Near:   Left Eye Near:    Bilateral Near:     [facility] Couse / Diagnostics / Procedures:     Radiology DG Chest 2 View Result Date: 10/16/2023 CLINICAL DATA:  Cough with green sputum EXAM: CHEST - 2 VIEW COMPARISON:  03/25/2023 FINDINGS: The heart size and mediastinal contours are within normal limits. Both lungs are clear. The visualized skeletal structures are unremarkable. IMPRESSION: No active cardiopulmonary disease. Electronically Signed   By: Luke Bun M.D.   On: 10/16/2023 16:15    Procedures Procedures (including critical care time) EKG  Pending results:  Labs Reviewed - No data to display  Medications Ordered in UC: Medications - No data to display  UC Diagnoses / Final Clinical Impressions(s)   I have reviewed the triage vital signs and the nursing notes.  Pertinent labs & imaging results that were available during my care of the patient were reviewed by me and considered in my medical decision making (see chart for details).    Final diagnoses:  Cough productive of purulent sputum  Pneumonia of left upper lobe due to infectious organism   Radiology report states no active cardiopulmonary disease however per my personal read I believe the patient has a developing pneumonia in the inferior aspect of his left upper lobe.  Patient started on dual antibiotic therapy with Augmentin  and azithromycin .  Robitussin DM provided to broad-spectrum ration and Promethazine  DM provided for nighttime cough.  Conservative care recommended.  Return precautions advised.  Please see discharge instructions below for details of plan of care as provided to patient. ED Prescriptions     Medication Sig Dispense Auth. Provider   amoxicillin -clavulanate (AUGMENTIN ) 875-125 MG tablet Take 1  tablet by mouth 2 (two) times daily for 5 days. 10 tablet Joesph Shaver Scales, PA-C   azithromycin  (ZITHROMAX ) 250 MG tablet Take 2 tablets (500 mg total) by mouth daily for 1 day, THEN 1 tablet (250 mg total) daily for 4 days. 6 tablet Joesph Shaver Scales, PA-C   guaifenesin  (HUMIBID E) 400 MG TABS tablet Take 1 tablet 3 times daily as needed for chest congestion and cough 30 tablet Joesph Shaver Scales, PA-C   promethazine -dextromethorphan (PROMETHAZINE -DM) 6.25-15 MG/5ML syrup Take 5 mLs by mouth at bedtime as needed for cough. 60 mL Joesph Shaver Scales, PA-C      PDMP not reviewed this encounter.  Pending results:  Labs Reviewed - No data to display    Discharge Instructions      The radiologist reviewed your chest x-ray was not impressed that you have any signs of pneumonia however per my personal read, I believe that you are developing pneumonia in your left upper lobe.  This reason, I believe that you need to be treated with antibiotics.  Please read below to learn more about the medications, dosages and frequencies that I recommend to help alleviate your symptoms and to get you feeling better soon:   Augmentin  (amoxicillin  - clavulanic acid):  Please take  one (1) dose twice daily for 5 days.  This antibiotic can cause upset stomach, this will resolve once antibiotics are complete.  You are welcome to take a probiotic, eat yogurt, take Imodium while taking this medication.  Please avoid other systemic medications such as Maalox, Pepto-Bismol or milk of magnesia as they can interfere with the body's ability to absorb the antibiotics.    Z-Pak (azithromycin ):  Please take two (2) tablets on day one and one tablet daily thereafter until the prescription is complete.This antibiotic can cause upset stomach, this will resolve once antibiotics are complete.  You are welcome to take a probiotic, eat yogurt, take Imodium while taking this medication.  Please avoid other systemic  medications such as Maalox, Pepto-Bismol or milk of magnesia as they can interfere with the body's ability to absorb the antibiotics.   Robitussin, Mucinex  (guaifenesin ): This is a daytime expectorant.  This single symptom reliever helps break up chest congestion and loosen up thick nasal drainage making phlegm and drainage easier to cough up and to blow out from your nose.  I recommend taking 400 mg in either liquid or tablet form three times daily as needed.  I do not recommend the 12-hour extended relief version or doses higher than 400 mg per each dose as these often make some patients feel jittery or jumpy and can interfere with sleep.  I also do not recommend that you purchase guaifenesin  with the ingredient  DM which is dextromethorphan, a cough suppressant which I only recommend taking at bedtime.  Guaifenesin  400 mg is a safe dose for people who are being treated for high blood pressure.     Promethazine  DM: Promethazine  is both a nasal decongestant that dries up mucous membranes and an antinausea medication.  Promethazine  often makes most patients feel fairly sleepy.  DM is dextromethorphan, a single symptom reliever which is a cough suppressant found in many over-the-counter cough medications and combination cold preparations.  Please take 5 mL before bedtime to minimize your cough which will help you sleep better.  I have sent a prescription for this medication to your pharmacy because it cannot be purchased over-the-counter.   If symptoms have not meaningfully improved in the next 5 to 7 days, please return for repeat evaluation or follow-up with your regular provider.  If symptoms have worsened in the next 3 to 5 days, please return for repeat evaluation or follow-up with your regular provider.    Thank you for visiting urgent care today.  We appreciate the opportunity to participate in your care.       Disposition Upon Discharge:  Condition: stable for discharge home  Patient  presented with an acute illness with associated systemic symptoms and significant discomfort requiring urgent management. In my opinion, this is a condition that a prudent lay person (someone who possesses an average knowledge of health and medicine) may potentially expect to result in complications if not addressed urgently such as respiratory distress, impairment of bodily function or dysfunction of bodily organs.   Routine symptom specific, illness specific and/or disease specific instructions were discussed with the patient and/or caregiver at length.   As such, the patient has been evaluated and assessed, work-up was performed and treatment was provided in alignment with urgent care protocols and evidence based medicine.  Patient/parent/caregiver has been advised that the patient may require follow up for further testing and treatment if the symptoms continue in spite of treatment, as clinically indicated and appropriate.  Patient/parent/caregiver has been advised  to return to the [facility] or PCP if no better; to PCP or the Emergency Department if new signs and symptoms develop, or if the current signs or symptoms continue to change or worsen for further workup, evaluation and treatment as clinically indicated and appropriate  The patient will follow up with their current PCP if and as advised. If the patient does not currently have a PCP we will assist them in obtaining one.   The patient may need specialty follow up if the symptoms continue, in spite of conservative treatment and management, for further workup, evaluation, consultation and treatment as clinically indicated and appropriate.  Patient/parent/caregiver verbalized understanding and agreement of plan as discussed.  All questions were addressed during visit.  Please see discharge instructions below for further details of plan.  This office note has been dictated using Teaching laboratory technician.  Unfortunately, this method of  dictation can sometimes lead to typographical or grammatical errors.  I apologize for your inconvenience in advance if this occurs.  Please do not hesitate to reach out to me if clarification is needed.      Joesph Shaver Scales, PA-C 10/16/23 1625

## 2023-10-22 ENCOUNTER — Other Ambulatory Visit: Payer: Self-pay | Admitting: Nurse Practitioner

## 2023-10-22 ENCOUNTER — Encounter: Payer: Medicare HMO | Admitting: Internal Medicine

## 2023-10-28 DIAGNOSIS — Z823 Family history of stroke: Secondary | ICD-10-CM | POA: Diagnosis not present

## 2023-10-28 DIAGNOSIS — Z008 Encounter for other general examination: Secondary | ICD-10-CM | POA: Diagnosis not present

## 2023-10-28 DIAGNOSIS — D6869 Other thrombophilia: Secondary | ICD-10-CM | POA: Diagnosis not present

## 2023-10-28 DIAGNOSIS — Z8249 Family history of ischemic heart disease and other diseases of the circulatory system: Secondary | ICD-10-CM | POA: Diagnosis not present

## 2023-10-28 DIAGNOSIS — M109 Gout, unspecified: Secondary | ICD-10-CM | POA: Diagnosis not present

## 2023-10-28 DIAGNOSIS — I700 Atherosclerosis of aorta: Secondary | ICD-10-CM | POA: Diagnosis not present

## 2023-10-28 DIAGNOSIS — E785 Hyperlipidemia, unspecified: Secondary | ICD-10-CM | POA: Diagnosis not present

## 2023-10-28 DIAGNOSIS — I252 Old myocardial infarction: Secondary | ICD-10-CM | POA: Diagnosis not present

## 2023-10-28 DIAGNOSIS — I4891 Unspecified atrial fibrillation: Secondary | ICD-10-CM | POA: Diagnosis not present

## 2023-10-28 DIAGNOSIS — N1832 Chronic kidney disease, stage 3b: Secondary | ICD-10-CM | POA: Diagnosis not present

## 2023-10-28 DIAGNOSIS — I129 Hypertensive chronic kidney disease with stage 1 through stage 4 chronic kidney disease, or unspecified chronic kidney disease: Secondary | ICD-10-CM | POA: Diagnosis not present

## 2023-10-28 DIAGNOSIS — I25119 Atherosclerotic heart disease of native coronary artery with unspecified angina pectoris: Secondary | ICD-10-CM | POA: Diagnosis not present

## 2023-10-28 DIAGNOSIS — N529 Male erectile dysfunction, unspecified: Secondary | ICD-10-CM | POA: Diagnosis not present

## 2023-10-29 DIAGNOSIS — N1832 Chronic kidney disease, stage 3b: Secondary | ICD-10-CM | POA: Diagnosis not present

## 2023-11-01 ENCOUNTER — Other Ambulatory Visit: Payer: Self-pay | Admitting: Cardiovascular Disease

## 2023-11-01 DIAGNOSIS — I4819 Other persistent atrial fibrillation: Secondary | ICD-10-CM

## 2023-11-02 DIAGNOSIS — D631 Anemia in chronic kidney disease: Secondary | ICD-10-CM | POA: Diagnosis not present

## 2023-11-02 DIAGNOSIS — I129 Hypertensive chronic kidney disease with stage 1 through stage 4 chronic kidney disease, or unspecified chronic kidney disease: Secondary | ICD-10-CM | POA: Diagnosis not present

## 2023-11-02 DIAGNOSIS — N189 Chronic kidney disease, unspecified: Secondary | ICD-10-CM | POA: Diagnosis not present

## 2023-11-02 DIAGNOSIS — N2581 Secondary hyperparathyroidism of renal origin: Secondary | ICD-10-CM | POA: Diagnosis not present

## 2023-11-02 DIAGNOSIS — N1832 Chronic kidney disease, stage 3b: Secondary | ICD-10-CM | POA: Diagnosis not present

## 2023-11-03 NOTE — Telephone Encounter (Signed)
Eliquis 5mg  refill request received. Patient is 78 years old, weight-94.1kg, Crea-1.70 on 08/18/23, Diagnosis-Afib, and last seen by Dr. Elease Hashimoto on 11/12/22 and pending appt 11/10/23. Dose is appropriate based on dosing criteria. Will send in refill to requested pharmacy.

## 2023-11-05 ENCOUNTER — Encounter: Payer: Self-pay | Admitting: Internal Medicine

## 2023-11-05 NOTE — Patient Instructions (Addendum)
Blood work was ordered.       Medications changes include :   consider trying the flonase nasal spray for your ear   Start exercsing   A referral was ordered neurology - dr tat and audiology for your hearing and someone will call you to schedule an appointment.     Return in about 6 months (around 05/05/2024) for follow up.   Health Maintenance, Male Adopting a healthy lifestyle and getting preventive care are important in promoting health and wellness. Ask your health care provider about: The right schedule for you to have regular tests and exams. Things you can do on your own to prevent diseases and keep yourself healthy. What should I know about diet, weight, and exercise? Eat a healthy diet  Eat a diet that includes plenty of vegetables, fruits, low-fat dairy products, and lean protein. Do not eat a lot of foods that are high in solid fats, added sugars, or sodium. Maintain a healthy weight Body mass index (BMI) is a measurement that can be used to identify possible weight problems. It estimates body fat based on height and weight. Your health care provider can help determine your BMI and help you achieve or maintain a healthy weight. Get regular exercise Get regular exercise. This is one of the most important things you can do for your health. Most adults should: Exercise for at least 150 minutes each week. The exercise should increase your heart rate and make you sweat (moderate-intensity exercise). Do strengthening exercises at least twice a week. This is in addition to the moderate-intensity exercise. Spend less time sitting. Even light physical activity can be beneficial. Watch cholesterol and blood lipids Have your blood tested for lipids and cholesterol at 80 years of age, then have this test every 5 years. You may need to have your cholesterol levels checked more often if: Your lipid or cholesterol levels are high. You are older than 80 years of age. You are  at high risk for heart disease. What should I know about cancer screening? Many types of cancers can be detected early and may often be prevented. Depending on your health history and family history, you may need to have cancer screening at various ages. This may include screening for: Colorectal cancer. Prostate cancer. Skin cancer. Lung cancer. What should I know about heart disease, diabetes, and high blood pressure? Blood pressure and heart disease High blood pressure causes heart disease and increases the risk of stroke. This is more likely to develop in people who have high blood pressure readings or are overweight. Talk with your health care provider about your target blood pressure readings. Have your blood pressure checked: Every 3-5 years if you are 76-64 years of age. Every year if you are 18 years old or older. If you are between the ages of 80 and 64 and are a current or former smoker, ask your health care provider if you should have a one-time screening for abdominal aortic aneurysm (AAA). Diabetes Have regular diabetes screenings. This checks your fasting blood sugar level. Have the screening done: Once every three years after age 74 if you are at a normal weight and have a low risk for diabetes. More often and at a younger age if you are overweight or have a high risk for diabetes. What should I know about preventing infection? Hepatitis B If you have a higher risk for hepatitis B, you should be screened for this virus. Talk with your health care  provider to find out if you are at risk for hepatitis B infection. Hepatitis C Blood testing is recommended for: Everyone born from [birth year] through 1967. Anyone with known risk factors for hepatitis C. Sexually transmitted infections (STIs) You should be screened each year for STIs, including gonorrhea and chlamydia, if: You are sexually active and are younger than 80 years of age. You are older than 80 years of age and your health  care provider tells you that you are at risk for this type of infection. Your sexual activity has changed since you were last screened, and you are at increased risk for chlamydia or gonorrhea. Ask your health care provider if you are at risk. Ask your health care provider about whether you are at high risk for HIV. Your health care provider may recommend a prescription medicine to help prevent HIV infection. If you choose to take medicine to prevent HIV, you should first get tested for HIV. You should then be tested every 3 months for as long as you are taking the medicine. Follow these instructions at home: Alcohol use Do not drink alcohol if your health care provider tells you not to drink. If you drink alcohol: Limit how much you have to 0-2 drinks a day. Know how much alcohol is in your drink. In the [place] one drink equals one 12 oz bottle of beer (355 mL), one 5 oz glass of wine (148 mL), or one 1 oz glass of hard liquor (44 mL). Lifestyle Do not use any products that contain nicotine or tobacco. These products include cigarettes, chewing tobacco, and vaping devices, such as e-cigarettes. If you need help quitting, ask your health care provider. Do not use street drugs. Do not share needles. Ask your health care provider for help if you need support or information about quitting drugs. General instructions Schedule regular health, dental, and eye exams. Stay current with your vaccines. Tell your health care provider if: You often feel depressed. You have ever been abused or do not feel safe at home. Summary Adopting a healthy lifestyle and getting preventive care are important in promoting health and wellness. Follow your health care provider's instructions about healthy diet, exercising, and getting tested or screened for diseases. Follow your health care provider's instructions on monitoring your cholesterol and blood pressure. This information is not intended to replace advice given  to you by your health care provider. Make sure you discuss any questions you have with your health care provider. Document Revised: 02/11/2021 Document Reviewed: 02/11/2021 Elsevier Patient Education  2024 [employer].

## 2023-11-05 NOTE — Progress Notes (Signed)
Subjective:    Patient ID: [name], male    DOB: 24-Oct-1945, 78 y.o.   MRN: [id number]     HPI [name] is here for a physical exam and his chronic medical problems.  He is here with his wife.    Tremor in hands R> L - started a couple of years ago - worse in last 6 months.  No head tremor.  Tremor more at rest, but has tremor when eating.     His wife is also concerned about his memory.  Medications and allergies reviewed with patient and updated if appropriate.  Current Outpatient Medications on File Prior to Visit  Medication Sig Dispense Refill   allopurinol (ZYLOPRIM) 100 MG tablet Take 1 tablet (100 mg total) by mouth daily. 90 tablet 3   calcium carbonate (TUMS - DOSED IN MG ELEMENTAL CALCIUM) 500 MG chewable tablet Chew 2 tablets by mouth daily as needed for indigestion or heartburn.      clopidogrel (PLAVIX) 75 MG tablet TAKE 1 TABLET BY MOUTH EVERY DAY WITH BREAKFAST 90 tablet 1   Cyanocobalamin (VITAMIN B12 PO) Take 1 tablet by mouth every evening.     diltiazem (CARDIZEM CD) 240 MG 24 hr capsule TAKE 1 CAPSULE BY MOUTH EVERY DAY 90 capsule 0   ELIQUIS 5 MG TABS tablet TAKE 1 TABLET BY MOUTH TWICE A DAY 180 tablet 1   ezetimibe (ZETIA) 10 MG tablet TAKE 1 TABLET BY MOUTH EVERY DAY 90 tablet 0   fish oil-omega-3 fatty acids 1000 MG capsule Take 2 g by mouth 2 (two) times daily.       metoprolol tartrate (LOPRESSOR) 50 MG tablet TAKE 1 TABLET BY MOUTH TWICE A DAY 180 tablet 2   Misc Natural Products (TART CHERRY ADVANCED PO) Take 1,000 mg by mouth daily.     nitroGLYCERIN (NITROSTAT) 0.4 MG SL tablet Place 1 tablet (0.4 mg total) under the tongue every 5 (five) minutes x 3 doses as needed for chest pain. 25 tablet 4   Potassium Gluconate 2 MEQ TABS Take 1 tablet by mouth daily.     rosuvastatin (CRESTOR) 20 MG tablet TAKE 1 TABLET BY MOUTH EVERY DAY 90 tablet 0   No current facility-administered medications on file prior to visit.    Review of Systems   Constitutional:  Negative for fever.  HENT:  Positive for hearing loss.        Right ear pops  Eyes:  Negative for visual disturbance.  Respiratory:  Positive for cough (from PNA - getting better - a little mucus). Negative for shortness of breath and wheezing.   Cardiovascular:  Negative for chest pain, palpitations and leg swelling.  Gastrointestinal:  Negative for abdominal pain, blood in stool, constipation and diarrhea.       No gerd  Genitourinary:  Negative for difficulty urinating and dysuria.  Musculoskeletal:  Negative for arthralgias and back pain.  Skin:  Positive for rash (recurring rash- has cream from derm).  Neurological:  Positive for tremors. Negative for light-headedness and headaches.  Psychiatric/Behavioral:  Negative for dysphoric mood. The patient is not nervous/anxious.        Objective:   Vitals:   11/06/23 1302  BP: 116/78  Pulse: 79  Temp: 98 F (36.7 C)  SpO2: 99%   Filed Weights   11/06/23 1302  Weight: 202 lb (91.6 kg)   Body mass index is 25.25 kg/m.  BP Readings from Last 3 Encounters:  11/06/23 116/78  10/16/23 109/73  10/09/23 100/62    Wt Readings from Last 3 Encounters:  11/06/23 202 lb (91.6 kg)  10/09/23 207 lb 6.4 oz (94.1 kg)  08/18/23 202 lb 1.6 oz (91.7 kg)      Physical Exam Constitutional: He appears well-developed and well-nourished. No distress.  HENT:  Head: Normocephalic and atraumatic.  Right Ear: External ear normal.  Left Ear: External ear normal.  Normal ear canals and TM b/l  Mouth/Throat: Oropharynx is clear and moist. Eyes: Conjunctivae and EOM are normal.  Neck: Neck supple. No tracheal deviation present. No thyromegaly present.  No carotid bruit  Cardiovascular: Normal rate, regular rhythm, normal heart sounds and intact distal pulses.   No murmur heard.  No lower extremity edema. Pulmonary/Chest: Effort normal and breath sounds normal. No respiratory distress. He has no wheezes. He has no rales.   Abdominal: Soft. He exhibits no distension. There is no tenderness.  Genitourinary: deferred  Lymphadenopathy:   He has no cervical adenopathy.  Neurology: tremor in R hand > L hand.?  Rigidity in right upper extremity Skin: Skin is warm and dry. He is not diaphoretic.  Psychiatric: He has a normal mood and affect. His behavior is normal.         Assessment & Plan:   Physical exam: Screening blood work  ordered Exercise   none - stressed regular exercise Weight  normal Substance abuse   none   Reviewed recommended immunizations.   Health Maintenance  Topic Date Due   Pneumonia Vaccine 40+ Years old (1 of 2 - PCV) Never done   COVID-19 Vaccine (3 - Pfizer risk series) 01/31/2020   INFLUENZA VACCINE  01/04/2024 (Originally 05/07/2023)   Zoster Vaccines- Shingrix (1 of 2) 01/07/2024 (Originally 04/27/1965)   Colonoscopy  10/08/2024 (Originally 10/27/2022)   Medicare Annual Wellness (AWV)  10/08/2024   DTaP/Tdap/Td (3 - Td or Tdap) 02/17/2033   Hepatitis C Screening  Completed   HPV VACCINES  Aged Out     See Problem List for Assessment and Plan of chronic medical problems.

## 2023-11-06 ENCOUNTER — Ambulatory Visit (INDEPENDENT_AMBULATORY_CARE_PROVIDER_SITE_OTHER): Payer: Medicare HMO | Admitting: Internal Medicine

## 2023-11-06 VITALS — BP 116/78 | HR 79 | Temp 98.0°F | Ht 75.0 in | Wt 202.0 lb

## 2023-11-06 DIAGNOSIS — Z0000 Encounter for general adult medical examination without abnormal findings: Secondary | ICD-10-CM | POA: Diagnosis not present

## 2023-11-06 DIAGNOSIS — R251 Tremor, unspecified: Secondary | ICD-10-CM | POA: Diagnosis not present

## 2023-11-06 DIAGNOSIS — I2510 Atherosclerotic heart disease of native coronary artery without angina pectoris: Secondary | ICD-10-CM | POA: Diagnosis not present

## 2023-11-06 DIAGNOSIS — E785 Hyperlipidemia, unspecified: Secondary | ICD-10-CM | POA: Diagnosis not present

## 2023-11-06 DIAGNOSIS — H9190 Unspecified hearing loss, unspecified ear: Secondary | ICD-10-CM | POA: Insufficient documentation

## 2023-11-06 DIAGNOSIS — E538 Deficiency of other specified B group vitamins: Secondary | ICD-10-CM | POA: Diagnosis not present

## 2023-11-06 DIAGNOSIS — R7303 Prediabetes: Secondary | ICD-10-CM | POA: Diagnosis not present

## 2023-11-06 DIAGNOSIS — I10 Essential (primary) hypertension: Secondary | ICD-10-CM

## 2023-11-06 DIAGNOSIS — R7989 Other specified abnormal findings of blood chemistry: Secondary | ICD-10-CM | POA: Diagnosis not present

## 2023-11-06 DIAGNOSIS — I4819 Other persistent atrial fibrillation: Secondary | ICD-10-CM

## 2023-11-06 DIAGNOSIS — N1832 Chronic kidney disease, stage 3b: Secondary | ICD-10-CM | POA: Diagnosis not present

## 2023-11-06 LAB — CBC WITH DIFFERENTIAL/PLATELET
Basophils Absolute: 0 10*3/uL (ref 0.0–0.1)
Basophils Relative: 0.4 % (ref 0.0–3.0)
Eosinophils Absolute: 0.1 10*3/uL (ref 0.0–0.7)
Eosinophils Relative: 2.1 % (ref 0.0–5.0)
HCT: 42.3 % (ref 39.0–52.0)
Hemoglobin: 14.3 g/dL (ref 13.0–17.0)
Lymphocytes Relative: 26.1 % (ref 12.0–46.0)
Lymphs Abs: 1.4 10*3/uL (ref 0.7–4.0)
MCHC: 33.8 g/dL (ref 30.0–36.0)
MCV: 85.2 fL (ref 78.0–100.0)
Monocytes Absolute: 0.4 10*3/uL (ref 0.1–1.0)
Monocytes Relative: 6.3 % (ref 3.0–12.0)
Neutro Abs: 3.6 10*3/uL (ref 1.4–7.7)
Neutrophils Relative %: 65.1 % (ref 43.0–77.0)
Platelets: 129 10*3/uL — ABNORMAL LOW (ref 150.0–400.0)
RBC: 4.96 Mil/uL (ref 4.22–5.81)
RDW: 14.3 % (ref 11.5–15.5)
WBC: 5.6 10*3/uL (ref 4.0–10.5)

## 2023-11-06 LAB — LIPID PANEL
Cholesterol: 98 mg/dL (ref 0–200)
HDL: 44.4 mg/dL (ref >39.00)
LDL Cholesterol: 25 mg/dL (ref 0–99)
NonHDL: 53.79
Total CHOL/HDL Ratio: 2
Triglycerides: 146 mg/dL (ref 0.0–149.0)
VLDL: 29.2 mg/dL (ref 0.0–40.0)

## 2023-11-06 LAB — COMPREHENSIVE METABOLIC PANEL
ALT: 15 U/L (ref 0–53)
AST: 22 U/L (ref 0–37)
Albumin: 4.4 g/dL (ref 3.5–5.2)
Alkaline Phosphatase: 46 U/L (ref 39–117)
BUN: 18 mg/dL (ref 6–23)
CO2: 28 meq/L (ref 19–32)
Calcium: 9.3 mg/dL (ref 8.4–10.5)
Chloride: 103 meq/L (ref 96–112)
Creatinine, Ser: 1.43 mg/dL (ref 0.40–1.50)
GFR: 46.6 mL/min — ABNORMAL LOW (ref >60.00)
Glucose, Bld: 90 mg/dL (ref 70–99)
Potassium: 3.9 meq/L (ref 3.5–5.1)
Sodium: 138 meq/L (ref 135–145)
Total Bilirubin: 0.7 mg/dL (ref 0.2–1.2)
Total Protein: 6.9 g/dL (ref 6.0–8.3)

## 2023-11-06 LAB — VITAMIN B12: Vitamin B-12: 609 pg/mL (ref 211–911)

## 2023-11-06 LAB — HEMOGLOBIN A1C: Hgb A1c MFr Bld: 5.7 % (ref 4.6–6.5)

## 2023-11-06 LAB — TSH: TSH: 4.52 u[IU]/mL (ref 0.35–5.50)

## 2023-11-06 NOTE — Assessment & Plan Note (Signed)
He does have decreased hearing today during the visit and wife states that his hearing has not been good for a while He agrees to see audiology for further evaluation Referral ordered

## 2023-11-06 NOTE — Assessment & Plan Note (Signed)
Chronic Following with cardiology On Eliquis 5 mg twice daily, metoprolol 50 mg twice daily, diltiazem 240 mg daily

## 2023-11-06 NOTE — Assessment & Plan Note (Signed)
Chronic Regular exercise and healthy diet encouraged Check lipids Continue Crestor 20 mg daily, Zetia 10 mg daily

## 2023-11-06 NOTE — Assessment & Plan Note (Signed)
Chronic Lab Results  Component Value Date   HGBA1C 5.7 10/21/2022    Check a1c Low sugar / carb diet Stressed regular exercise

## 2023-11-06 NOTE — Assessment & Plan Note (Signed)
Chronic Following with cardiology No symptoms consistent with angina On Plavix 75 mg daily, Eliquis 5 mg twice daily, diltiazem to 40 mg daily, metoprolol 50 mg twice daily, Zetia 10 mg daily, Crestor 20 mg daily

## 2023-11-06 NOTE — Assessment & Plan Note (Signed)
Chronic Stage IIIa Kidney function has remained stable Advised increased water intake, avoiding NSAIDs Stressed good blood pressure, sugar control Advised low-sodium diet CBC, CMP

## 2023-11-06 NOTE — Assessment & Plan Note (Signed)
History of elevated TSH Recheck TSH

## 2023-11-06 NOTE — Assessment & Plan Note (Signed)
Chronic Taking B12 vitamins daily Check B12 level

## 2023-11-06 NOTE — Assessment & Plan Note (Signed)
Has had a tremor for a while, but it has gotten worse in the last 6 months Bilateral hand tremor-right hand > left hand No head tremor Wife also states has some memory issues ?  Rigidity on exam Concern for Parkinson's disease Referral ordered to neurology-Dr. Tat

## 2023-11-06 NOTE — Assessment & Plan Note (Signed)
Chronic Blood pressure well controlled CMP Continue metoprolol 50 mg twice daily, diltiazem 240 mg daily

## 2023-11-07 ENCOUNTER — Other Ambulatory Visit: Payer: Self-pay | Admitting: Internal Medicine

## 2023-11-07 ENCOUNTER — Other Ambulatory Visit: Payer: Self-pay | Admitting: Cardiovascular Disease

## 2023-11-08 ENCOUNTER — Encounter: Payer: Self-pay | Admitting: Cardiovascular Disease

## 2023-11-08 NOTE — Progress Notes (Signed)
 Cardiology Office Note:    Date:  11/10/2023   ID:  [name], DOB [birth year], MRN [id number]  PCP:  Geofm Glade PARAS, MD  Cardiologist:  Aleene Passe, MD  Electrophysiologist:  None   Referring MD: Geofm Glade PARAS, MD   Chief Complaint  Patient presents with   Coronary Artery Disease        Atrial Fibrillation         Previous notes.    [name] is a 78 y.o. male with a hx of history of coronary artery disease.  He status post non-ST segment elevation myocardial infarction in 2011.  He status post DES to the RCA, status post DES to the LAD in 2015, status post DES to the RCA in 2016.  He has mildly reduced left ventricular systolic function by echo in October, 2020.  He also has a history of hypertension, hyperlipidemia, CKD, pulmonary embolus, persistent atrial fibrillation and is on Eliquis .  This is the first time meeting him.   No cp.  Is active , does lots of yard work .   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, 2021: [name] is seen today for follow-up.  He has a history of coronary artery disease.  Status post stenting of his right coronary artery and his left and descending artery.  He has mildly reduced left ventricular systolic function.  He also has a history of hypertension, hyperlipidemia, CKD, pulmonary embolus and persistent atrial fibrillation.  He is on Eliquis .  Saw Scott weaver in early august.  Had some tunnel  Vision,  Thought to have a TIA  CT of the head did not reveal any specific abnormality.  Brain MRI did not reveal any evidence of stroke or TIA.  It revealed only small vessel changes.  TSH was mildly elevated.   I asked him to consult with his medical doctor for further advice on that.    Jan. 12, 2023 [name] is seen today for follow up of his CAD, s/p stenting of his RCA and LAD  Has mildly reduced LV function . Hx of HTN, HLD, CKD , hx of PE   No angina , no dyspnea Getting some regular exercise   Had  COVID in Aug., 2022 Is better .   Nov 12, 2022 [name] is seen today for follow up of his CAD, s/p stenting of his LAD and RCA  Has mildly reduced LV function  He was diagnosed with atrial fibrillation several years ago   He has been on Eliquis . He still in A-fib today.  He is tolerating Eliquis  quite well No CP  Mild DOE if he exerts himself  Echo shows normal LV function   Feb. 4, 2025: [name] is seen for follow up of his CAD , hx of atrial fib  Is able to walk some , no CP   Lipids look good. LDL is 25   Past Medical History:  Diagnosis Date   Anemia, mild    CAD (coronary artery disease)    a. 02/2010 NSTEMI/PCI: PROMUS DES to mRCA. b. 06/05/14 Cath/PCI: pLAD 70%--> s/p PCI/DES (Promus DES);  c. 01/2015 Cath/PCI: LM nl, LAD 47m, patent prox stent, LCX patent prox stent, RCA 30p, 55m (3.5x20 Synergy DES). // Myoview  07/2019:  EF 48, atten artifact, no ischemia, intermediate risk (low EF)    CKD (chronic kidney disease), stage III (HCC)    Diastolic dysfunction    a. Echo 6/11: mild LVH, EF 55-60%,  GR1DD, Trivial MR, mild LAE. b. echo 06/04/14: EF 55-60%, no WMA, GR1DD, Ao valve mildly calcifed, mild MR, mild LAE   History of pulmonary embolism    Hyperlipidemia    Hypertension    did not tolerate Lisinopril event at low dose   Melanoma (HCC)    a. 1987. b. 2012   Myocardial infarction (HCC)    Persistent atrial fibrillation (HCC) 12/03/2018   Dx 11/2018 >> Apixaban  started // Echo 12/2018: EF 55-60, mod RVE, normal RVSF, mild LAE, trivial MR, mild TR, mod AoV sclerosis    Prostate cancer (HCC)    Dr Ottelin   Stroke [facility])    TIA (transient ischemic attack)    a. post cath and PCI on 06/05/2014 - no residual sequelae. // ?recurrent TIA 8/21 >> Carotid US  8/21: normal  // Echocardiogram 8/21: EF 55-60, no RWMA, normal RVSF, mild BAE, trivial MR, trivial AI    Vitamin B12 deficiency     Past Surgical History:  Procedure Laterality Date   CHOLECYSTECTOMY     COLONOSCOPY  2006    CORONARY STENT INTERVENTION N/A 08/05/2019   Procedure: CORONARY STENT INTERVENTION;  Surgeon: Dann Candyce RAMAN, MD;  Location: [facility] INVASIVE CV LAB;  Service: Cardiovascular;  Laterality: N/A;   KNEE ARTHROSCOPY     right   LEFT HEART CATH AND CORONARY ANGIOGRAPHY N/A 08/05/2019   Procedure: LEFT HEART CATH AND CORONARY ANGIOGRAPHY;  Surgeon: Dann Candyce RAMAN, MD;  Location: [facility] INVASIVE CV LAB;  Service: Cardiovascular;  Laterality: N/A;   LEFT HEART CATHETERIZATION WITH CORONARY ANGIOGRAM N/A 06/05/2014   Procedure: LEFT HEART CATHETERIZATION WITH CORONARY ANGIOGRAM;  Surgeon: Ozell JONETTA Fell, MD;  Location: [facility] CATH LAB;  Service: Cardiovascular;  Laterality: N/A;   LEFT HEART CATHETERIZATION WITH CORONARY ANGIOGRAM N/A 01/19/2015   Procedure: LEFT HEART CATHETERIZATION WITH CORONARY ANGIOGRAM;  Surgeon: Peter M Jordan, MD;  Location: [facility] CATH LAB;  Service: Cardiovascular;  Laterality: N/A;   MELANOMA EXCISION  2011    L chest   MELANOMA EXCISION  1991   back   mRCA stent  02/2010   PROSTATECTOMY     Dr. Ottelin   TONSILLECTOMY      Current Medications: Current Meds  Medication Sig   allopurinol  (ZYLOPRIM ) 100 MG tablet TAKE 1 TABLET BY MOUTH EVERY DAY   calcium  carbonate (TUMS - DOSED IN MG ELEMENTAL CALCIUM ) 500 MG chewable tablet Chew 2 tablets by mouth daily as needed for indigestion or heartburn.    clopidogrel  (PLAVIX ) 75 MG tablet TAKE 1 TABLET BY MOUTH EVERY DAY WITH BREAKFAST   Cyanocobalamin  (VITAMIN B12 PO) Take 1 tablet by mouth every evening.   diltiazem  (CARDIZEM  CD) 240 MG 24 hr capsule TAKE 1 CAPSULE BY MOUTH EVERY DAY   ELIQUIS  5 MG TABS tablet TAKE 1 TABLET BY MOUTH TWICE A DAY   ezetimibe  (ZETIA ) 10 MG tablet TAKE 1 TABLET BY MOUTH EVERY DAY   fish oil-omega-3 fatty acids 1000 MG capsule Take 2 g by mouth 2 (two) times daily.     Misc Natural Products (TART CHERRY ADVANCED PO) Take 1,000 mg by mouth daily.   nitroGLYCERIN  (NITROSTAT ) 0.4 MG SL tablet Place 1  tablet (0.4 mg total) under the tongue every 5 (five) minutes x 3 doses as needed for chest pain.   Potassium Gluconate 2 MEQ TABS Take 1 tablet by mouth daily.   rosuvastatin  (CRESTOR ) 20 MG tablet TAKE 1 TABLET BY MOUTH EVERY DAY   [DISCONTINUED] metoprolol  tartrate (LOPRESSOR ) 50 MG  tablet Take 1 tablet (50 mg total) by mouth 2 (two) times daily.     Allergies:   Nitroglycerin  and Lidocaine    Social History   Socioeconomic History   Marital status: Married    Spouse name: Arlean   Number of children: 2   Years of education: Not on file   Highest education level: Not on file  Occupational History   Occupation: Retired  Tobacco Use   Smoking status: Never   Smokeless tobacco: Never  Vaping Use   Vaping status: Never Used  Substance and Sexual Activity   Alcohol use: No    Alcohol/week: 0.0 standard drinks of alcohol   Drug use: No   Sexual activity: Not on file  Other Topics Concern   Not on file  Social History Narrative   Right handed    Caffeine- 2-3 cups per day    Lives with wife   Social Drivers of Corporate Investment Banker Strain: Low Risk  (10/09/2023)   Overall Financial Resource Strain (CARDIA)    Difficulty of Paying Living Expenses: Not hard at all  Food Insecurity: No Food Insecurity (10/09/2023)   Hunger Vital Sign    Worried About Running Out of Food in the Last Year: Never true    Ran Out of Food in the Last Year: Never true  Transportation Needs: No Transportation Needs (10/09/2023)   PRAPARE - Administrator, Civil Service (Medical): No    Lack of Transportation (Non-Medical): No  Physical Activity: Inactive (10/09/2023)   Exercise Vital Sign    Days of Exercise per Week: 0 days    Minutes of Exercise per Session: 0 min  Stress: No Stress Concern Present (10/09/2023)   [employer] of Occupational Health - Occupational Stress Questionnaire    Feeling of Stress : Not at all  Social Connections: Moderately Isolated (10/09/2023)   Social  Connection and Isolation Panel [NHANES]    Frequency of Communication with Friends and Family: Twice a week    Frequency of Social Gatherings with Friends and Family: Twice a week    Attends Religious Services: Never    Database Administrator or Organizations: No    Attends Engineer, Structural: Never    Marital Status: Married     Family History: The patient's family history includes Coronary artery disease in his mother; Diabetes in his sister; Heart disease in his mother; Hypertension in his brother; Prostate cancer in his father; Transient ischemic attack (age of onset: 20) in his mother.  ROS:   Please see the history of present illness.     All other systems reviewed and are negative.  EKGs/Labs/Other Studies Reviewed:    The following studies were reviewed today:    Recent Labs: 11/06/2023: ALT 15; BUN 18; Creatinine, Ser 1.43; Hemoglobin 14.3; Platelets 129.0; Potassium 3.9; Sodium 138; TSH 4.52  Recent Lipid Panel    Component Value Date/Time   CHOL 98 11/06/2023 1341   CHOL 98 (L) 01/27/2023 0846   CHOL 122 (L) 02/07/2016 0745   TRIG 146.0 11/06/2023 1341   TRIG 113 02/07/2016 0745   TRIG 220 (HH) 09/07/2006 1105   HDL 44.40 11/06/2023 1341   HDL 41 01/27/2023 0846   HDL 38 (L) 02/07/2016 0745   CHOLHDL 2 11/06/2023 1341   VLDL 29.2 11/06/2023 1341   LDLCALC 25 11/06/2023 1341   LDLCALC 42 01/27/2023 0846   LDLCALC 61 02/07/2016 0745   LDLDIRECT 68.8 07/20/2012 0837    Physical  Exam:     Physical Exam: Blood pressure 122/80, pulse 76, height 6' 3 (1.905 m), weight 205 lb 6.4 oz (93.2 kg), SpO2 95%.       GEN:  elderly male  in no acute distress HEENT: Normal NECK: No JVD; No carotid bruits LYMPHATICS: No lymphadenopathy CARDIAC: irregl irreg.  no murmurs, rubs, gallops RESPIRATORY:  Clear to auscultation without rales, wheezing or rhonchi  ABDOMEN: Soft, non-tender, non-distended MUSCULOSKELETAL:  No edema; No deformity  SKIN: Warm and  dry NEUROLOGIC:  Alert and oriented x 3   ECG:  .        ASSESSMENT:    1. Coronary artery disease involving native coronary artery of native heart without angina pectoris   2. Persistent atrial fibrillation (HCC), rapid ventricular response   3. NSTEMI (non-ST elevated myocardial infarction) (HCC)       PLAN:     1.  Coronary artery disease:   s/p coronary stenting   Doing well, no angina    2.  Atrial fibrillation:     Chronic afib ,  rate is well controlled.   3.  Hyperlipidemia-   lipids are controlled.    4.  Chronic kidney disease:    Medication Adjustments/Labs and Tests Ordered: Current medicines are reviewed at length with the patient today.  Concerns regarding medicines are outlined above.    No orders of the defined types were placed in this encounter.  Meds ordered this encounter  Medications   metoprolol  tartrate (LOPRESSOR ) 50 MG tablet    Sig: Take 1 tablet (50 mg total) by mouth 2 (two) times daily.    Dispense:  180 tablet    Refill:  3    Patient Instructions  Medication Instructions:  Refilled Metoprolol  Tartrate 50 mg twice daily *If you need a refill on your cardiac medications before your next appointment, please call your pharmacy*  Follow-Up: At [facility], you and your health needs are our priority.  As part of our continuing mission to provide you with exceptional heart care, we have created designated Provider Care Teams.  These Care Teams include your primary Cardiologist (physician) and Advanced Practice Providers (APPs -  Physician Assistants and Nurse Practitioners) who all work together to provide you with the care you need, when you need it.  We recommend signing up for the patient portal called MyChart.  Sign up information is provided on this After Visit Summary.  MyChart is used to connect with patients for Virtual Visits (Telemedicine).  Patients are able to view lab/test results, encounter notes, upcoming  appointments, etc.  Non-urgent messages can be sent to your provider as well.   To learn more about what you can do with MyChart, go to [email or web address].    Your next appointment:   1 year(s)  Provider:   Aleene Passe, MD     Other Instructions   1st Floor: - Lobby - Registration  - Pharmacy  - Lab - Cafe  2nd Floor: - PV Lab - Diagnostic Testing (echo, CT, nuclear med)  3rd Floor: - Vacant  4th Floor: - TCTS (cardiothoracic surgery) - AFib Clinic - Structural Heart Clinic - Vascular Surgery  - Vascular Ultrasound  5th Floor: - HeartCare Cardiology (general and EP) - Clinical Pharmacy for coumadin, hypertension, lipid, weight-loss medications, and med management appointments    Valet parking services will be available as well.          Signed, Aleene Passe, MD  11/10/2023 11:34 AM  [facility]

## 2023-11-09 ENCOUNTER — Other Ambulatory Visit: Payer: Self-pay | Admitting: Internal Medicine

## 2023-11-09 NOTE — Telephone Encounter (Signed)
Copied from CRM [id number]. Topic: Clinical - Medication Refill >> Nov 09, 2023  1:10 PM Florestine Avers wrote: Most Recent Primary Care Visit:  Provider: BURNS, Bobette Mo  Department: [facility]  Visit Type: PHYSICAL  Date: 11/06/2023  Medication: metoprolol tartrate (LOPRESSOR) 50 MG tablet   Has the patient contacted their pharmacy? Yes (Agent: If no, request that the patient contact the pharmacy for the refill. If patient does not wish to contact the pharmacy document the reason why and proceed with request.) (Agent: If yes, when and what did the pharmacy advise?)  Is this the correct pharmacy for this prescription? Yes If no, delete pharmacy and type the correct one.  This is the patient's preferred pharmacy:  [id number] [place], [place] - [address] [place] [address] Phone: [phone] Fax: [phone]   Has the prescription been filled recently? Yes  Is the patient out of the medication? Yes  Has the patient been seen for an appointment in the last year OR does the patient have an upcoming appointment? Yes  Can we respond through MyChart? No  Agent: Please be advised that Rx refills may take up to 3 business days. We ask that you follow-up with your pharmacy.

## 2023-11-10 ENCOUNTER — Encounter: Payer: Self-pay | Admitting: Cardiovascular Disease

## 2023-11-10 ENCOUNTER — Ambulatory Visit: Payer: Medicare HMO | Attending: Cardiovascular Disease | Admitting: Cardiovascular Disease

## 2023-11-10 ENCOUNTER — Encounter: Payer: Self-pay | Admitting: Neurology

## 2023-11-10 VITALS — BP 122/80 | HR 76 | Ht 75.0 in | Wt 205.4 lb

## 2023-11-10 DIAGNOSIS — I2510 Atherosclerotic heart disease of native coronary artery without angina pectoris: Secondary | ICD-10-CM

## 2023-11-10 DIAGNOSIS — I214 Non-ST elevation (NSTEMI) myocardial infarction: Secondary | ICD-10-CM

## 2023-11-10 DIAGNOSIS — I4819 Other persistent atrial fibrillation: Secondary | ICD-10-CM

## 2023-11-10 MED ORDER — METOPROLOL TARTRATE 50 MG PO TABS: 50.0000 mg | ORAL_TABLET | Freq: Two times a day (BID) | ORAL | 0 refills | Status: DC

## 2023-11-10 MED ORDER — METOPROLOL TARTRATE 50 MG PO TABS: 50.0000 mg | ORAL_TABLET | Freq: Two times a day (BID) | ORAL | 3 refills | Status: AC

## 2023-11-10 NOTE — Patient Instructions (Signed)
 Medication Instructions:  Refilled Metoprolol  Tartrate 50 mg twice daily *If you need a refill on your cardiac medications before your next appointment, please call your pharmacy*  Follow-Up: At [facility], you and your health needs are our priority.  As part of our continuing mission to provide you with exceptional heart care, we have created designated Provider Care Teams.  These Care Teams include your primary Cardiologist (physician) and Advanced Practice Providers (APPs -  Physician Assistants and Nurse Practitioners) who all work together to provide you with the care you need, when you need it.  We recommend signing up for the patient portal called MyChart.  Sign up information is provided on this After Visit Summary.  MyChart is used to connect with patients for Virtual Visits (Telemedicine).  Patients are able to view lab/test results, encounter notes, upcoming appointments, etc.  Non-urgent messages can be sent to your provider as well.   To learn more about what you can do with MyChart, go to [email or web address].    Your next appointment:   1 year(s)  Provider:   Aleene Passe, MD     Other Instructions   1st Floor: - Lobby - Registration  - Pharmacy  - Lab - Cafe  2nd Floor: - PV Lab - Diagnostic Testing (echo, CT, nuclear med)  3rd Floor: - Vacant  4th Floor: - TCTS (cardiothoracic surgery) - AFib Clinic - Structural Heart Clinic - Vascular Surgery  - Vascular Ultrasound  5th Floor: - HeartCare Cardiology (general and EP) - Clinical Pharmacy for coumadin, hypertension, lipid, weight-loss medications, and med management appointments    Valet parking services will be available as well.

## 2023-11-17 NOTE — Progress Notes (Unsigned)
Assessment/Plan:   1.  Parkinsons disease, Hoehn & Yoehr 1-1.5  -Patient and I discussed nature and pathophysiology.  Patient has very mild and early disease.  -After some discussion we decided to start carbidopa/levodopa and work to carbidopa/levodopa 25/100, 1 tablet 3 times per day at 8 AM/noon/4 PM.  Discussed risk, benefits, side effects.  -Discussed community resources.  Patient information given.  -He and I discussed physical therapy for Parkinson's.  He wants to think about that for now.   2.  A-fib/history of PE  -on eliquis  -pt also on plavix.  Pt states unknown why on both of the above.  I reviewed recent cardiology notes, but did not actually make mention of either of the medications.  Patients cardiologist does prescribe both, however.  I am quite sure there is a good reason for both of the medications.  He and I did discuss increased risk of falls with Parkinson's, but his balance was actually quite good today.  3.  Hx melanoma  -follows with dermatology at [facility] dermatology  -He and I discussed that Parkinsons increases risk for melanoma.  Subjective:   [name] was seen today in the movement disorders clinic for neurologic consultation at the request of Burns, Bobette Mo, MD.  The consultation is for the evaluation of tremor.  Pt previously a pt of [facility].  Outside records that were made available to me were reviewed.  Patient was referred to rule out Parkinson's.  Wife with patient who supplements hx.    Tremor: Yes.     How long has it been going on? 1 year ago per pt (several years in notes), worse over the last 6 months  At rest or with activation?  Primarily at rest  Fam hx of tremor?  Only paternal aunt with Parkinsons Disease   Located where?  Right greater than left upper extremity.  Started on the R; now on the L Patient is right-hand dominant  Affected by caffeine:  doesn't know (drinks pepsi)  Affected by alcohol:  doesn't drink any  Affected by stress:  Yes.     Affected by fatigue:  Yes.    Spills soup if on spoon:  No. But has to be careful  Affects ADL's (tying shoes, brushing teeth, etc):  No.  Other Specific Symptoms:  Voice: mostly no change Sleep: sleeps well  Vivid Dreams:  Yes.    Acting out dreams:  No. Per pt; some yelling at night per wife Wet Pillows: No. Postural symptoms:  Yes.    Falls?  None in at least 6 months Bradykinesia symptoms: no bradykinesia noted; no shuffle Loss of smell:  No. Loss of taste:  No. Urinary Incontinence:  No. Difficulty Swallowing:  No. Handwriting, micrographia: Yes.   Trouble with ADL's:  No.  Trouble buttoning clothing: Yes.   Depression:  No. Memory changes:  No. Hallucinations:  No.  visual distortions: Yes.   N/V:  No. Lightheaded:  No.  Syncope: No. Diplopia:  No. Dyskinesia:  No.  MRI brain was done in August, 2021 with evidence of mild small vessel disease.  I reviewed it.   ALLERGIES:   Allergies  Allergen Reactions   Nitroglycerin Other (See Comments)    Pt will not take.  States he was in hospital and after taking second NTG SL pt states "I had to be shocked, I am scared of that medicine and will not take again".    Lidocaine Other (See Comments)    Syncope post  palmer injection; probably vasovagal as no reaction to intraarticular Lidocaine into knee    CURRENT MEDICATIONS:  Current Outpatient Medications  Medication Instructions   allopurinol (ZYLOPRIM) 100 mg, Oral, Daily   calcium carbonate (TUMS - DOSED IN MG ELEMENTAL CALCIUM) 500 MG chewable tablet 2 tablets, Daily PRN   clopidogrel (PLAVIX) 75 MG tablet TAKE 1 TABLET BY MOUTH EVERY DAY WITH BREAKFAST   Cyanocobalamin (VITAMIN B12 PO) 1 tablet, Every evening   diltiazem (CARDIZEM CD) 240 MG 24 hr capsule Oral, Daily   Eliquis 5 mg, Oral, 2 times daily   ezetimibe (ZETIA) 10 mg, Oral, Daily   fish oil-omega-3 fatty acids 2 g, 2 times daily   metoprolol tartrate (LOPRESSOR) 50 mg, Oral, 2 times daily   Misc  Natural Products (TART CHERRY ADVANCED PO) 1,000 mg, Daily   nitroGLYCERIN (NITROSTAT) 0.4 mg, Sublingual, Every 5 min x3 PRN   Potassium Gluconate 2 MEQ TABS 1 tablet, Daily   rosuvastatin (CRESTOR) 20 mg, Oral, Daily    Objective:   PHYSICAL EXAMINATION:    VITALS:   Vitals:   11/19/23 1319  BP: 122/70  Pulse: 72  SpO2: 97%  Weight: 203 lb 3.2 oz (92.2 kg)  Height: 6\' 4"  (1.93 m)    GEN:  The patient appears stated age and is in NAD. HEENT:  Normocephalic, atraumatic.  The mucous membranes are moist. The superficial temporal arteries are without ropiness or tenderness. CV: Irregular Lungs:  CTAB Neck/HEME:  There are no carotid bruits bilaterally.  Neurological examination:  Orientation: The patient is alert and oriented x3.  Cranial nerves: There is good facial symmetry.  Extraocular muscles are intact. The visual fields are full to confrontational testing. The speech is fluent and clear. Soft palate rises symmetrically and there is no tongue deviation. Hearing is decreased to conversational tone. Sensation: Sensation is intact to light touch throughout (facial, trunk, extremities). Vibration is intact at the bilateral big toe, albeit slightly decreased distally. There is no extinction with double simultaneous stimulation.  Motor: Strength is 5/5 in the bilateral upper and lower extremities.   Shoulder shrug is equal and symmetric.  There is no pronator drift. Deep tendon reflexes: Deep tendon reflexes are 2/4 at the bilateral biceps, triceps, brachioradialis, patella and achilles. Plantar responses are downgoing bilaterally.  Movement examination: Tone: There is nl tone in the bilateral upper extremities.  The tone in the lower extremities is nl.  Abnormal movements: there is RUE rest tremor Coordination:  There is  decremation with RAM's, only with finger taps on the R.  All other RAMs are nl Gait and Station: The patient is able to arise without the use of his hands, but  he does fall back and the examiner caught him.  He ambulates quite well in the hall.  H I have reviewed and interpreted the following labs independently   Chemistry      Component Value Date/Time   NA 138 11/06/2023 1341   NA 143 01/27/2023 0846   K 3.9 11/06/2023 1341   CL 103 11/06/2023 1341   CO2 28 11/06/2023 1341   BUN 18 11/06/2023 1341   BUN 16 01/27/2023 0846   CREATININE 1.43 11/06/2023 1341   CREATININE 1.70 (H) 08/18/2023 1122   CREATININE 1.53 (H) 02/07/2016 0745      Component Value Date/Time   CALCIUM 9.3 11/06/2023 1341   ALKPHOS 46 11/06/2023 1341   AST 22 11/06/2023 1341   AST 28 08/18/2023 1122   ALT 15 11/06/2023 1341  ALT 30 08/18/2023 1122   BILITOT 0.7 11/06/2023 1341   BILITOT 0.8 08/18/2023 1122      Lab Results  Component Value Date   TSH 4.52 11/06/2023   Lab Results  Component Value Date   WBC 5.6 11/06/2023   HGB 14.3 11/06/2023   HCT 42.3 11/06/2023   MCV 85.2 11/06/2023   PLT 129.0 (L) 11/06/2023      Total time spent on today's visit was 68 minutes, including both face-to-face time and nonface-to-face time.  Time included that spent on review of records (prior notes available to me/labs/imaging if pertinent), discussing treatment and goals, answering patient's questions and coordinating care.  Cc:  Pincus Sanes, MD

## 2023-11-18 ENCOUNTER — Ambulatory Visit: Payer: Medicare HMO | Admitting: Neurology

## 2023-11-19 ENCOUNTER — Encounter: Payer: Self-pay | Admitting: Neurology

## 2023-11-19 ENCOUNTER — Ambulatory Visit (INDEPENDENT_AMBULATORY_CARE_PROVIDER_SITE_OTHER): Payer: Medicare HMO | Admitting: Neurology

## 2023-11-19 VITALS — BP 122/70 | HR 72 | Ht 76.0 in | Wt 203.2 lb

## 2023-11-19 DIAGNOSIS — G20A1 Parkinson's disease without dyskinesia, without mention of fluctuations: Secondary | ICD-10-CM

## 2023-11-19 MED ORDER — CARBIDOPA-LEVODOPA 25-100 MG PO TABS: 1.0000 | ORAL_TABLET | Freq: Three times a day (TID) | ORAL | 1 refills | Status: DC

## 2023-11-19 NOTE — Patient Instructions (Addendum)
Go [place]!  Start Carbidopa Levodopa as follows: Take 1/2 tablet three times daily, at least 30 minutes before meals (approximately 8am/noon/4pm), for one week Then take 1/2 tablet in the morning at 8am, 1/2 tablet in the afternoon at noon, 1 tablet in the evening at 4pm, at least 30 minutes before meals, for one week Then take 1/2 tablet in the morning, 1 tablet in the afternoon, 1 tablet in the evening, at least 30 minutes before meals, for one week Then take 1 tablet three times daily at 8am/noon/4pm, at least 30 minutes before meals   As a reminder, carbidopa/levodopa can be taken at the same time as a carbohydrate, but we like to have you take your pill either 30 minutes before a protein source or 1 hour after as protein can interfere with carbidopa/levodopa absorption.   Local and Online Resources for Power over Parkinson's Group?  Febraury 2025 ?  LOCAL [place] PARKINSON'S GROUPS??  Power over Parkinson's Group:???  Upcoming Power over [employer] Meetings/Care Partner Support:? 2nd Wednesdays of the month at 2 pm:  February 12th, March 12th Contact Lynwood Dawley at [email or web address] or Amy Marriott at [email or web address] if interested in participating in this group?  ?  LOCAL EVENTS AND NEW OFFERINGS?  Dance Project Spring 2025:  January 14-May 20, Tuesdays 10-11 am.  All details on website: [email or web address] ACT FITNESS Chair Yoga classes "Train and Gain", Fridays 10 am, [facility] [employer].  Contact Gina at [phone].   PWR! Moves [place] class!  Wednesdays at 10 am.  Please contact Lonia Blood, PT at [email or web address] if interested. Health visitor Classes offering at [employer]!? Tuesdays (Chair Yoga)  and Wednesdays (PWR! Moves)  1:00 pm.?? Contact Aldona Lento [phone] or [email or web address] Drumming for Parkinson's will be held on 2nd and 4th Mondays at 11:00 am.??  Located at the [place] [facility] [place] ([address] [place] ? Contact Albertina Parr at [email or web address] or [phone]?  [facility] Parkinson's Tai Chi Class, Mondays at 11 am.  Call [phone] for details  [facility] at [facility]- [address], [place] Wednesdays- 4:00 - 5:00 PM - specifically for Parkinson's Disease.  Free!  Contact Denny Peon, [place] - [phone] (clinic) or  [phone] (cell) or by email: [email or web address]   ?ONLINE EDUCATION AND SUPPORT?  Parkinson Foundation:? www.parkinson.org?  PD Health at Home continues:? Mindfulness Mondays, Wellness Wednesdays, Fitness Fridays??  Upcoming Education:??  Learn More, Live Better.  Parkinson's Symposium Scientist, water quality and [place], [place]).   Saturday, February 8th, 9:30-2:00 Staying Connected:  Nurturing Wellness beyond [employer]. Wednesday, February 12th, 1-2 pm Impulse Control Disorders:  Understanding and Managing the Challenges.  Wednesday, February 19th, 1-2 pm Veterans and Parkinson's:  [employer].  Thursday, February 27th, 2:30-4 pm Care Partner Conversations.  Wednesday, March 5th, 1-2 pm Expert Briefing:    Nourishing Wellness-Nutrition for [employer].  Wednesday, March 12th, 1-2 pm. Register for virtual education and Photographer (webinars) at [employer].gl  Please check out their website to sign up for emails and see their full online offerings??  ?  [employer] Foundation:? www.michaeljfox.org??  Third Thursday Webinars:? On the third Thursday of every month at 12 p.m. ET, join our free live webinars to learn about various aspects of living with Parkinson's disease and our work to speed medical breakthroughs.?  Upcoming Webinar:? How Government Policies Impact Parkinson's Research:  Wins and Next Steps.  Thursday, February 20th at 12 noon.  Check out additional information on their  website  to see their full online offerings?  ?  [employer]:? www.[email or web address].org?  Upcoming Webinar:   Overcoming Hurdles to Exercise.  Tuesday, February 18th at 5 pm Series:? Living with Parkinson's Meetup.?? Third Thursdays each month, 3 pm?  Care Partner Monthly Meetup.? With Jillene Bucks [place].? First Tuesday of each month, 2 pm?  Check out additional information to Live Well Today on their website?  ?  Parkinson and Movement Disorders (PMD) Alliance:? www.pmdalliance.org?  [employer]Life Online:? Online Education Events?  Sign up for emails, which are sent weekly to give you updates on programming and online offerings?  ?  Parkinson's Association of the [place]:? www.parkinsonassociation.org?  Information on online support groups, education events, and online exercises including Yoga, Parkinson's exercises and more-LOTS of information on links to PD resources and online events?  Virtual Support Group through [employer] of the [place]-First Wednesday of each month at 2 pm   MOVEMENT AND EXERCISE OPPORTUNITIES?  PWR! Moves [place] class continues!  Wednesdays at 10 am.  Please contact Lonia Blood, PT at [email or web address] if interested. Parkinson's Exercise Class offerings at [employer]. Tuesdays (Chair yoga) and Wednesdays (PWR! Moves)  1:00 pm.?  Contact Aldona Lento [phone] or [email or web address]  Parkinson's Wellness Recovery (PWR! Moves)? www.pwr4life.org?  Info on the PWR! Virtual Experience:? You will have access to our expertise?through self-assessment, guided plans that start with the PD-specific fundamentals, educational content, tips, Q&A with an expert, and a growing Engineering geologist of PD-specific pre-recorded and live exercise classes of varying types and intensity - both physical and cognitive! If that is not enough, we offer 1:1 wellness consultations (in-person or virtual) to personalize your PWR! Conservator, museum/gallery.??  Parkinson [employer] Fridays:??  As part of the PD Health @ Home program, this free video series focuses each week on one aspect of fitness designed to support people living with Parkinson's.? These weekly videos highlight the Parkinson Foundation fitness guidelines for people with Parkinson's disease.?  MenusLocal.com.br?  Dance for PD website is offering free, live-stream classes throughout the week, as well as links to [employer] of classes:? https://danceforparkinsons.org/?  Virtual dance and Pilates for Parkinson's classes: Click on the Community Tab> Parkinson's Movement Initiative Tab.? To register for classes and for more information, visit www.[email or web address].co.za and click the "community" tab.??  [employer] Parkinson's Cycling Classes??  [facility]:? Thursdays @ Noon-Live classes at [employer] ([employer] at [email or web address]?or [phone])?  [facility]: Classes Tuesday, Wednesday and Thursday (contact [place] at [email or web address] ?or [phone])?  [employer]?  Varied levels of classes are offered Tuesdays and Thursdays at [facility].??  Stretching with Byrd Hesselbach weekly class is also offered for people with Parkinson's?  To observe a class or for more information, call [phone] or email Patricia Nettle at [email or web address]?    ADDITIONAL SUPPORT AND RESOURCES?  Well-Spring [employer]:  Chiropractor:? www.well-springsolutions.org/caregiver-education/caregiver-support-group.? You may also contact Loleta Chance at [facility] -spring.org or [phone].????  Well-Spring Navigator:? Just1Navigator program, a?free service to help individuals and families through the journey of determining care for older adults.? The "Navigator" is a Child psychotherapist, Sidney Ace, who will speak with a prospective client  and/or loved ones to provide an assessment of the situation and a set of recommendations for a personalized care plan -- all free of charge, and whether?Well-Spring [employer] offers the needed service or not. If the need is not a service we provide, we are well-connected with reputable programs in town that we can refer you to.? www.well-springsolutions.org or to speak with the  Navigator, call [phone].?

## 2023-12-07 ENCOUNTER — Other Ambulatory Visit: Payer: Self-pay | Admitting: Internal Medicine

## 2023-12-07 DIAGNOSIS — H9193 Unspecified hearing loss, bilateral: Secondary | ICD-10-CM

## 2024-01-26 ENCOUNTER — Other Ambulatory Visit: Payer: Self-pay | Admitting: Cardiovascular Disease

## 2024-01-31 ENCOUNTER — Other Ambulatory Visit: Payer: Self-pay | Admitting: Cardiovascular Disease

## 2024-02-08 DIAGNOSIS — L812 Freckles: Secondary | ICD-10-CM | POA: Diagnosis not present

## 2024-02-08 DIAGNOSIS — D692 Other nonthrombocytopenic purpura: Secondary | ICD-10-CM | POA: Diagnosis not present

## 2024-02-08 DIAGNOSIS — L570 Actinic keratosis: Secondary | ICD-10-CM | POA: Diagnosis not present

## 2024-02-08 DIAGNOSIS — D1801 Hemangioma of skin and subcutaneous tissue: Secondary | ICD-10-CM | POA: Diagnosis not present

## 2024-02-08 DIAGNOSIS — L821 Other seborrheic keratosis: Secondary | ICD-10-CM | POA: Diagnosis not present

## 2024-02-08 DIAGNOSIS — Z85820 Personal history of malignant melanoma of skin: Secondary | ICD-10-CM | POA: Diagnosis not present

## 2024-02-08 DIAGNOSIS — D225 Melanocytic nevi of trunk: Secondary | ICD-10-CM | POA: Diagnosis not present

## 2024-02-09 ENCOUNTER — Inpatient Hospital Stay: Payer: Medicare HMO | Attending: Internal Medicine

## 2024-02-09 DIAGNOSIS — D696 Thrombocytopenia, unspecified: Secondary | ICD-10-CM | POA: Diagnosis not present

## 2024-02-09 DIAGNOSIS — G20A1 Parkinson's disease without dyskinesia, without mention of fluctuations: Secondary | ICD-10-CM | POA: Insufficient documentation

## 2024-02-09 DIAGNOSIS — D472 Monoclonal gammopathy: Secondary | ICD-10-CM | POA: Diagnosis not present

## 2024-02-09 LAB — CMP (CANCER CENTER ONLY)
ALT: 7 U/L (ref 0–44)
AST: 27 U/L (ref 15–41)
Albumin: 4.5 g/dL (ref 3.5–5.0)
Alkaline Phosphatase: 51 U/L (ref 38–126)
Anion gap: 4 — ABNORMAL LOW (ref 5–15)
BUN: 15 mg/dL (ref 8–23)
CO2: 30 mmol/L (ref 22–32)
Calcium: 9.4 mg/dL (ref 8.9–10.3)
Chloride: 106 mmol/L (ref 98–111)
Creatinine: 1.57 mg/dL — ABNORMAL HIGH (ref 0.61–1.24)
GFR, Estimated: 45 mL/min — ABNORMAL LOW (ref >60)
Glucose, Bld: 79 mg/dL (ref 70–99)
Potassium: 4.3 mmol/L (ref 3.5–5.1)
Sodium: 140 mmol/L (ref 135–145)
Total Bilirubin: 0.7 mg/dL (ref 0.0–1.2)
Total Protein: 7.1 g/dL (ref 6.5–8.1)

## 2024-02-09 LAB — CBC WITH DIFFERENTIAL (CANCER CENTER ONLY)
Abs Immature Granulocytes: 0.01 10*3/uL (ref 0.00–0.07)
Basophils Absolute: 0 10*3/uL (ref 0.0–0.1)
Basophils Relative: 0 %
Eosinophils Absolute: 0.1 10*3/uL (ref 0.0–0.5)
Eosinophils Relative: 2 %
HCT: 40.1 % (ref 39.0–52.0)
Hemoglobin: 14.1 g/dL (ref 13.0–17.0)
Immature Granulocytes: 0 %
Lymphocytes Relative: 25 %
Lymphs Abs: 1.2 10*3/uL (ref 0.7–4.0)
MCH: 29.1 pg (ref 26.0–34.0)
MCHC: 35.2 g/dL (ref 30.0–36.0)
MCV: 82.7 fL (ref 80.0–100.0)
Monocytes Absolute: 0.3 10*3/uL (ref 0.1–1.0)
Monocytes Relative: 7 %
Neutro Abs: 3.1 10*3/uL (ref 1.7–7.7)
Neutrophils Relative %: 66 %
Platelet Count: 109 10*3/uL — ABNORMAL LOW (ref 150–400)
RBC: 4.85 MIL/uL (ref 4.22–5.81)
RDW: 13.5 % (ref 11.5–15.5)
WBC Count: 4.7 10*3/uL (ref 4.0–10.5)
nRBC: 0 % (ref 0.0–0.2)

## 2024-02-09 LAB — LACTATE DEHYDROGENASE: LDH: 158 U/L (ref 98–192)

## 2024-02-10 LAB — KAPPA/LAMBDA LIGHT CHAINS
Kappa free light chain: 22.9 mg/L — ABNORMAL HIGH (ref 3.3–19.4)
Kappa, lambda light chain ratio: 3.42 — ABNORMAL HIGH (ref 0.26–1.65)
Lambda free light chains: 6.7 mg/L (ref 5.7–26.3)

## 2024-02-10 LAB — BETA 2 MICROGLOBULIN, SERUM: Beta-2 Microglobulin: 3 mg/L — ABNORMAL HIGH (ref 0.6–2.4)

## 2024-02-11 LAB — MULTIPLE MYELOMA PANEL, SERUM
Albumin SerPl Elph-Mcnc: 4 g/dL (ref 2.9–4.4)
Albumin/Glob SerPl: 1.6 (ref 0.7–1.7)
Alpha 1: 0.2 g/dL (ref 0.0–0.4)
Alpha2 Glob SerPl Elph-Mcnc: 0.7 g/dL (ref 0.4–1.0)
B-Globulin SerPl Elph-Mcnc: 0.8 g/dL (ref 0.7–1.3)
Gamma Glob SerPl Elph-Mcnc: 1 g/dL (ref 0.4–1.8)
Globulin, Total: 2.6 g/dL (ref 2.2–3.9)
IgA: 53 mg/dL — ABNORMAL LOW (ref 61–437)
IgG (Immunoglobin G), Serum: 1119 mg/dL (ref 603–1613)
IgM (Immunoglobulin M), Srm: 15 mg/dL (ref 15–143)
M Protein SerPl Elph-Mcnc: 0.5 g/dL — ABNORMAL HIGH
Total Protein ELP: 6.6 g/dL (ref 6.0–8.5)

## 2024-02-16 ENCOUNTER — Inpatient Hospital Stay (HOSPITAL_BASED_OUTPATIENT_CLINIC_OR_DEPARTMENT_OTHER): Payer: Medicare HMO | Admitting: Internal Medicine

## 2024-02-16 VITALS — BP 113/83 | HR 72 | Temp 98.1°F | Resp 16 | Ht 76.0 in | Wt 197.4 lb

## 2024-02-16 DIAGNOSIS — D696 Thrombocytopenia, unspecified: Secondary | ICD-10-CM | POA: Diagnosis not present

## 2024-02-16 DIAGNOSIS — G20A1 Parkinson's disease without dyskinesia, without mention of fluctuations: Secondary | ICD-10-CM | POA: Diagnosis not present

## 2024-02-16 DIAGNOSIS — D472 Monoclonal gammopathy: Secondary | ICD-10-CM

## 2024-02-16 NOTE — Progress Notes (Signed)
 [name] [phone]   [phone]  OFFICE PROGRESS NOTE  Brian Dauphin, MD [address] [place] [address]  DIAGNOSIS: Monoclonal gammopathy of undetermined significance  PRIOR THERAPY: None  CURRENT THERAPY: Observation   INTERVAL HISTORY: [name] 78 y.o. male returns to the clinic today for 58-month follow-up visit accompanied by his wife, [name]. Discussed the use of AI scribe software for clinical note transcription with the patient, who gave verbal consent to proceed.  History of Present Illness   [name] is a 78 year old male with monoclonal gammopathy of undetermined significance (MGUS) who presents for evaluation and repeat blood work. He is accompanied by his wife, [name].  He is under observation for MGUS, with previous blood work showing low platelets at 109. Protein studies, including IgG, IgA, and IgM levels, have been reviewed, and there has been no significant progression of MGUS, with his condition remaining stable.  Since his last visit six months ago, he has been diagnosed with Parkinson's disease and started on Sinemet  IR 25/100 mg, taken three times a day, to manage his tremors. His wife notes occasional mild nausea, which may be related to the medication, but it does not persist.  No chest pain, breathing issues, nausea, vomiting, diarrhea, or headaches.       MEDICAL HISTORY: Past Medical History:  Diagnosis Date   Anemia, mild    CAD (coronary artery disease)    a. 02/2010 NSTEMI/PCI: PROMUS DES to mRCA. b. 06/05/14 Cath/PCI: pLAD 70%--> s/p PCI/DES (Promus DES);  c. 01/2015 Cath/PCI: LM nl, LAD 66m, patent prox stent, LCX patent prox stent, RCA 30p, 25m (3.5x20 Synergy DES). // Myoview  07/2019:  EF 48, atten artifact, no ischemia, intermediate risk (low EF)    CKD (chronic kidney disease), stage III (HCC)    Diastolic dysfunction    a. Echo 6/11: mild LVH, EF 55-60%, GR1DD, Trivial MR, mild LAE. b. echo  06/04/14: EF 55-60%, no WMA, GR1DD, Ao valve mildly calcifed, mild MR, mild LAE   History of pulmonary embolism    Hyperlipidemia    Hypertension    did not tolerate Lisinopril event at low dose   Melanoma (HCC)    a. 1987. b. 2012   Myocardial infarction (HCC)    Persistent atrial fibrillation (HCC) 12/03/2018   Dx 11/2018 >> Apixaban  started // Echo 12/2018: EF 55-60, mod RVE, normal RVSF, mild LAE, trivial MR, mild TR, mod AoV sclerosis    Prostate cancer (HCC)    Dr Ottelin   Stroke [facility])    TIA (transient ischemic attack)    a. post cath and PCI on 06/05/2014 - no residual sequelae. // ?recurrent TIA 8/21 >> Carotid US  8/21: normal  // Echocardiogram 8/21: EF 55-60, no RWMA, normal RVSF, mild BAE, trivial MR, trivial AI    Vitamin B12 deficiency     ALLERGIES:  is allergic to nitroglycerin  and lidocaine .  MEDICATIONS:  Current Outpatient Medications  Medication Sig Dispense Refill   allopurinol  (ZYLOPRIM ) 100 MG tablet TAKE 1 TABLET BY MOUTH EVERY DAY 90 tablet 3   calcium  carbonate (TUMS - DOSED IN MG ELEMENTAL CALCIUM ) 500 MG chewable tablet Chew 2 tablets by mouth daily as needed for indigestion or heartburn.      carbidopa -levodopa  (SINEMET  IR) 25-100 MG tablet Take 1 tablet by mouth 3 (three) times daily. 8am/noon/4pm 270 tablet 1   clopidogrel  (PLAVIX ) 75 MG tablet TAKE 1 TABLET BY MOUTH EVERY DAY  WITH BREAKFAST 90 tablet 3   Cyanocobalamin  (VITAMIN B12 PO) Take 1 tablet by mouth every evening.     diltiazem  (CARDIZEM  CD) 240 MG 24 hr capsule Take 1 capsule (240 mg total) by mouth daily. 90 capsule 3   ELIQUIS  5 MG TABS tablet TAKE 1 TABLET BY MOUTH TWICE A DAY 180 tablet 1   ezetimibe  (ZETIA ) 10 MG tablet TAKE 1 TABLET BY MOUTH EVERY DAY 90 tablet 3   fish oil-omega-3 fatty acids 1000 MG capsule Take 2 g by mouth 2 (two) times daily.       metoprolol  tartrate (LOPRESSOR ) 50 MG tablet Take 1 tablet (50 mg total) by mouth 2 (two) times daily. 180 tablet 3   Misc Natural Products  (TART CHERRY ADVANCED PO) Take 1,000 mg by mouth daily.     nitroGLYCERIN  (NITROSTAT ) 0.4 MG SL tablet Place 1 tablet (0.4 mg total) under the tongue every 5 (five) minutes x 3 doses as needed for chest pain. 25 tablet 4   Potassium Gluconate 2 MEQ TABS Take 1 tablet by mouth daily.     rosuvastatin  (CRESTOR ) 20 MG tablet TAKE 1 TABLET BY MOUTH EVERY DAY 90 tablet 3   No current facility-administered medications for this visit.    SURGICAL HISTORY:  Past Surgical History:  Procedure Laterality Date   CHOLECYSTECTOMY     COLONOSCOPY  10/06/2004   CORONARY STENT INTERVENTION N/A 08/05/2019   Procedure: CORONARY STENT INTERVENTION;  Surgeon: Lucendia Rusk, MD;  Location: [facility] INVASIVE CV LAB;  Service: Cardiovascular;  Laterality: N/A;   KNEE ARTHROSCOPY     right   LEFT HEART CATH AND CORONARY ANGIOGRAPHY N/A 08/05/2019   Procedure: LEFT HEART CATH AND CORONARY ANGIOGRAPHY;  Surgeon: Lucendia Rusk, MD;  Location: [facility] INVASIVE CV LAB;  Service: Cardiovascular;  Laterality: N/A;   LEFT HEART CATHETERIZATION WITH CORONARY ANGIOGRAM N/A 06/05/2014   Procedure: LEFT HEART CATHETERIZATION WITH CORONARY ANGIOGRAM;  Surgeon: Arlander Bellman, MD;  Location: [facility] CATH LAB;  Service: Cardiovascular;  Laterality: N/A;   LEFT HEART CATHETERIZATION WITH CORONARY ANGIOGRAM N/A 01/19/2015   Procedure: LEFT HEART CATHETERIZATION WITH CORONARY ANGIOGRAM;  Surgeon: Peter M Swaziland, MD;  Location[facility] CATH LAB;  Service: Cardiovascular;  Laterality: N/A;   MELANOMA EXCISION  10/06/2009   L chest   MELANOMA EXCISION  10/06/1989   back   mRCA stent  02/03/2010   PROSTATECTOMY     Dr. Ottelin   TONSILLECTOMY      REVIEW OF SYSTEMS:  A comprehensive review of systems was negative except for: Neurological: positive for tremors   PHYSICAL EXAMINATION: General appearance: alert, cooperative, and no distress Head: Normocephalic, without obvious abnormality, atraumatic Neck: no adenopathy, no JVD,  supple, symmetrical, trachea midline, and thyroid  not enlarged, symmetric, no tenderness/mass/nodules Lymph nodes: Cervical, supraclavicular, and axillary nodes normal. Resp: clear to auscultation bilaterally Back: symmetric, no curvature. ROM normal. No CVA tenderness. Cardio: regular rate and rhythm, S1, S2 normal, no murmur, click, rub or gallop GI: soft, non-tender; bowel sounds normal; no masses,  no organomegaly Extremities: extremities normal, atraumatic, no cyanosis or edema  ECOG PERFORMANCE STATUS: 1 - Symptomatic but completely ambulatory  Blood pressure 113/83, pulse 72, temperature 98.1 F (36.7 C), temperature source Temporal, resp. rate 16, height 6\' 4"  (1.93 m), weight 197 lb 6.4 oz (89.5 kg), SpO2 99%.  LABORATORY DATA: Lab Results  Component Value Date   WBC 4.7 02/09/2024   HGB 14.1 02/09/2024   HCT 40.1 02/09/2024  MCV 82.7 02/09/2024   PLT 109 (L) 02/09/2024      Chemistry      Component Value Date/Time   NA 140 02/09/2024 1106   NA 143 01/27/2023 0846   K 4.3 02/09/2024 1106   CL 106 02/09/2024 1106   CO2 30 02/09/2024 1106   BUN 15 02/09/2024 1106   BUN 16 01/27/2023 0846   CREATININE 1.57 (H) 02/09/2024 1106   CREATININE 1.53 (H) 02/07/2016 0745      Component Value Date/Time   CALCIUM  9.4 02/09/2024 1106   ALKPHOS 51 02/09/2024 1106   AST 27 02/09/2024 1106   ALT 7 02/09/2024 1106   BILITOT 0.7 02/09/2024 1106       RADIOGRAPHIC STUDIES: No results found.  ASSESSMENT AND PLAN: This is a very pleasant 78 years old white male with history of monoclonal gammopathy of undetermined significance (MGUS). The patient is currently on observation and he is feeling fine with no concerning complaints. He had repeat myeloma panel performed recently that showed no concerning findings for disease progression.     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 IgG, IgA, and  IgM levels are within acceptable ranges. Hemoglobin and white blood count are normal. Chronic thrombocytopenia persists with a platelet count of 109, consistent with previous levels. No new symptoms or concerns related to thrombocytopenia. - Monitor every six months. - Repeat blood work one week prior to the next appointment.  Parkinson's disease Parkinson's disease is managed with Sinemet  IR 25/100 mg, administered three times daily. He reports occasional nausea, potentially related to medication.   The patient was advised to call immediately if he has any concerning symptoms in the interval. The patient voices understanding of current disease status and treatment options and is in agreement with the current care plan.  All questions were answered. The patient knows to call the clinic with any problems, questions or concerns. We can certainly see the patient much sooner if necessary.  The total time spent in the appointment was 20 minutes.  Disclaimer: This note was dictated with voice recognition software. Similar sounding words can inadvertently be transcribed and may not be corrected upon review.

## 2024-02-23 ENCOUNTER — Other Ambulatory Visit: Payer: Self-pay | Admitting: Neurology

## 2024-02-23 DIAGNOSIS — G20A1 Parkinson's disease without dyskinesia, without mention of fluctuations: Secondary | ICD-10-CM

## 2024-04-04 NOTE — Progress Notes (Unsigned)
 Assessment/Plan:   1.  Parkinsons disease, diagnosed February, 2025             - Continue carbidopa /levodopa  25/100, 1 tablet 3 times per day.  He is having some internal tremor which he thought may be from carbidopa /levodopa  but I told him its from the disease itself             -increase exercise.     2.  A-fib/history of PE             -on eliquis              -pt also on plavix .  Pt states unknown why on both of the above.  I reviewed recent cardiology notes, but did not actually make mention of either of the medications.  Patients cardiologist does prescribe both, however.  I am quite sure there is a good reason for both of the medications.  He and I did discuss increased risk of falls with Parkinson's, but his balance was actually quite good today.   3.  Hx melanoma             -follows with dermatology at [facility] dermatology             -He and I discussed that Parkinsons increases risk for melanoma.  4. MGUS  Following with-hematology/oncology Subjective:   [name] was seen today in follow up for Parkinsons disease.  My previous records were reviewed prior to todays visit as well as outside records available to me.  Parkinsons was diagnosed last visit, although patient has very mild and early disease.  We ultimately decided to start low-dose levodopa .  Pt denies falls.  Pt denies lightheadedness, near syncope.  No hallucinations.  Mood has been good.  He is not exercising.    Current prescribed movement disorder medications: Carbidopa /levodopa  25/100, 1 tablet 3 times per day.    ALLERGIES:   Allergies  Allergen Reactions   Nitroglycerin  Other (See Comments)    Pt will not take.  States he was in hospital and after taking second NTG SL pt states I had to be shocked, I am scared of that medicine and will not take again.    Lidocaine  Other (See Comments)    Syncope post palmer injection; probably vasovagal as no reaction to intraarticular Lidocaine  into knee     CURRENT MEDICATIONS:  Current Meds  Medication Sig   allopurinol  (ZYLOPRIM ) 100 MG tablet TAKE 1 TABLET BY MOUTH EVERY DAY   calcium  carbonate (TUMS - DOSED IN MG ELEMENTAL CALCIUM ) 500 MG chewable tablet Chew 2 tablets by mouth daily as needed for indigestion or heartburn.    carbidopa -levodopa  (SINEMET  IR) 25-100 MG tablet TAKE 1 TABLET BY MOUTH 3 (THREE) TIMES DAILY. 8AM/NOON/4PM   clopidogrel  (PLAVIX ) 75 MG tablet TAKE 1 TABLET BY MOUTH EVERY DAY WITH BREAKFAST   Cyanocobalamin  (VITAMIN B12 PO) Take 1 tablet by mouth every evening.   diltiazem  (CARDIZEM  CD) 240 MG 24 hr capsule Take 1 capsule (240 mg total) by mouth daily.   ELIQUIS  5 MG TABS tablet TAKE 1 TABLET BY MOUTH TWICE A DAY   ezetimibe  (ZETIA ) 10 MG tablet TAKE 1 TABLET BY MOUTH EVERY DAY   fish oil-omega-3 fatty acids 1000 MG capsule Take 2 g by mouth 2 (two) times daily.     metoprolol  tartrate (LOPRESSOR ) 50 MG tablet Take 1 tablet (50 mg total) by mouth 2 (two) times daily.   Misc Natural Products (TART CHERRY ADVANCED PO)  Take 1,000 mg by mouth daily.   nitroGLYCERIN  (NITROSTAT ) 0.4 MG SL tablet Place 1 tablet (0.4 mg total) under the tongue every 5 (five) minutes x 3 doses as needed for chest pain.   Potassium Gluconate 2 MEQ TABS Take 1 tablet by mouth daily.   rosuvastatin  (CRESTOR ) 20 MG tablet TAKE 1 TABLET BY MOUTH EVERY DAY     Objective:   PHYSICAL EXAMINATION:    VITALS:   Vitals:   04/05/24 1105  BP: 126/70  Pulse: 78  SpO2: 98%  Weight: 197 lb (89.4 kg)    GEN:  The patient appears stated age and is in NAD. HEENT:  Normocephalic, atraumatic.  The mucous membranes are moist. The superficial temporal arteries are without ropiness or tenderness. CV:  RRR Lungs:  CTAB Neck/HEME:  There are no carotid bruits bilaterally.  Neurological examination:  Orientation: The patient is alert and oriented x3. Cranial nerves: There is good facial symmetry with no significant facial hypomimia. The speech is  fluent and clear. Soft palate rises symmetrically and there is no tongue deviation. Hearing is intact to conversational tone. Sensation: Sensation is intact to light touch throughout Motor: Strength is at least antigravity x4.  Movement examination: Tone: There is nl tone in the UE/LE Abnormal movements: there is RUE rest tremo Coordination:  There is no decremation with RAM's, with any form of RAMS, including alternating supination and pronation of the forearm, hand opening and closing, finger taps, heel taps and toe taps.  Gait and Station: The patient has no difficulty arising out of a deep-seated chair without the use of the hands. The patient's stride length is good.    I have reviewed and interpreted the following labs independently    Chemistry      Component Value Date/Time   NA 140 02/09/2024 1106   NA 143 01/27/2023 0846   K 4.3 02/09/2024 1106   CL 106 02/09/2024 1106   CO2 30 02/09/2024 1106   BUN 15 02/09/2024 1106   BUN 16 01/27/2023 0846   CREATININE 1.57 (H) 02/09/2024 1106   CREATININE 1.53 (H) 02/07/2016 0745      Component Value Date/Time   CALCIUM  9.4 02/09/2024 1106   ALKPHOS 51 02/09/2024 1106   AST 27 02/09/2024 1106   ALT 7 02/09/2024 1106   BILITOT 0.7 02/09/2024 1106       Lab Results  Component Value Date   WBC 4.7 02/09/2024   HGB 14.1 02/09/2024   HCT 40.1 02/09/2024   MCV 82.7 02/09/2024   PLT 109 (L) 02/09/2024    Lab Results  Component Value Date   TSH 4.52 11/06/2023     Total time spent on today's visit was 25 minutes, including both face-to-face time and nonface-to-face time.  Time included that spent on review of records (prior notes available to me/labs/imaging if pertinent), discussing treatment and goals, answering patient's questions and coordinating care.  Cc:  Geofm Glade PARAS, MD

## 2024-04-05 ENCOUNTER — Encounter: Payer: Self-pay | Admitting: Neurology

## 2024-04-05 ENCOUNTER — Ambulatory Visit: Payer: Medicare HMO | Admitting: Neurology

## 2024-04-05 VITALS — BP 126/70 | HR 78 | Wt 197.0 lb

## 2024-04-05 DIAGNOSIS — G20A1 Parkinson's disease without dyskinesia, without mention of fluctuations: Secondary | ICD-10-CM | POA: Diagnosis not present

## 2024-04-05 NOTE — Patient Instructions (Signed)

## 2024-05-09 ENCOUNTER — Encounter: Payer: Self-pay | Admitting: Internal Medicine

## 2024-05-09 NOTE — Patient Instructions (Addendum)
      Blood work was ordered.       Medications changes include :   pepcid  40 mg daily until you see cardiology     A referral was ordered cardiology and someone will call you to schedule an appointment.     Return in about 6 months (around 11/10/2024) for Physical Exam.

## 2024-05-09 NOTE — Progress Notes (Unsigned)
 Subjective:    Patient ID: [name], male    DOB: 1946-07-20, 78 y.o.   MRN: [id number]     HPI [name] is here for follow up of his chronic medical problems.  Has had some chest pain.  Had to occur with activity and improves with rest.  He also has shortness of breath with activity that improves with rest.  Sometimes with his chest pain he is having some gas bubbles - no gerd.  Tums helps.     He is not exercising.   He has lost some weight.  He does not get hungry as much so for a while his wife was letting him eat when he wanted to but now sh eis making she he is eating.    Medications and allergies reviewed with patient and updated if appropriate.  Current Outpatient Medications on File Prior to Visit  Medication Sig Dispense Refill   allopurinol  (ZYLOPRIM ) 100 MG tablet TAKE 1 TABLET BY MOUTH EVERY DAY 90 tablet 3   calcium  carbonate (TUMS - DOSED IN MG ELEMENTAL CALCIUM ) 500 MG chewable tablet Chew 2 tablets by mouth daily as needed for indigestion or heartburn.      carbidopa -levodopa  (SINEMET  IR) 25-100 MG tablet TAKE 1 TABLET BY MOUTH 3 (THREE) TIMES DAILY. 8AM/NOON/4PM 270 tablet 0   clopidogrel  (PLAVIX ) 75 MG tablet TAKE 1 TABLET BY MOUTH EVERY DAY WITH BREAKFAST 90 tablet 3   Cyanocobalamin  (VITAMIN B12 PO) Take 1 tablet by mouth every evening.     diltiazem  (CARDIZEM  CD) 240 MG 24 hr capsule Take 1 capsule (240 mg total) by mouth daily. 90 capsule 3   ELIQUIS  5 MG TABS tablet TAKE 1 TABLET BY MOUTH TWICE A DAY 180 tablet 1   ezetimibe  (ZETIA ) 10 MG tablet TAKE 1 TABLET BY MOUTH EVERY DAY 90 tablet 3   fish oil-omega-3 fatty acids 1000 MG capsule Take 2 g by mouth 2 (two) times daily.       metoprolol  tartrate (LOPRESSOR ) 50 MG tablet Take 1 tablet (50 mg total) by mouth 2 (two) times daily. 180 tablet 3   Misc Natural Products (TART CHERRY ADVANCED PO) Take 1,000 mg by mouth daily.     nitroGLYCERIN  (NITROSTAT ) 0.4 MG SL tablet Place 1 tablet (0.4 mg total)  under the tongue every 5 (five) minutes x 3 doses as needed for chest pain. 25 tablet 4   Potassium Gluconate 2 MEQ TABS Take 1 tablet by mouth daily.     rosuvastatin  (CRESTOR ) 20 MG tablet TAKE 1 TABLET BY MOUTH EVERY DAY 90 tablet 3   No current facility-administered medications on file prior to visit.     Review of Systems  Constitutional:  Positive for fatigue. Negative for fever.  Respiratory:  Positive for shortness of breath (increased). Negative for cough and wheezing.   Cardiovascular:  Positive for chest pain (last week). Negative for palpitations and leg swelling.  Gastrointestinal:        Burps, gas bubbles, no gerd  Neurological:  Positive for light-headedness (occ when stands up - transient). Negative for headaches.  Psychiatric/Behavioral:  Negative for dysphoric mood. The patient is not nervous/anxious.        Objective:   Vitals:   05/10/24 1013  BP: 128/80  Pulse: (!) 56  Temp: 97.7 F (36.5 C)  SpO2: 98%   BP Readings from Last 3 Encounters:  05/10/24 128/80  04/05/24 126/70  02/16/24 113/83   Wt Readings from Last 3  Encounters:  05/10/24 195 lb (88.5 kg)  04/05/24 197 lb (89.4 kg)  02/16/24 197 lb 6.4 oz (89.5 kg)   Body mass index is 23.74 kg/m.    Physical Exam Constitutional:      General: He is not in acute distress.    Appearance: Normal appearance. He is not ill-appearing.  HENT:     Head: Normocephalic and atraumatic.  Eyes:     Conjunctiva/sclera: Conjunctivae normal.  Cardiovascular:     Rate and Rhythm: Normal rate and regular rhythm.     Heart sounds: Normal heart sounds.  Pulmonary:     Effort: Pulmonary effort is normal. No respiratory distress.     Breath sounds: Normal breath sounds. No wheezing or rales.  Musculoskeletal:     Right lower leg: No edema.     Left lower leg: No edema.  Skin:    General: Skin is warm and dry.     Findings: No rash.  Neurological:     Mental Status: He is alert. Mental status is at  baseline.  Psychiatric:        Mood and Affect: Mood normal.        Lab Results  Component Value Date   WBC 4.7 02/09/2024   HGB 14.1 02/09/2024   HCT 40.1 02/09/2024   PLT 109 (L) 02/09/2024   GLUCOSE 79 02/09/2024   CHOL 98 11/06/2023   TRIG 146.0 11/06/2023   HDL 44.40 11/06/2023   LDLDIRECT 68.8 07/20/2012   LDLCALC 25 11/06/2023   ALT 7 02/09/2024   AST 27 02/09/2024   NA 140 02/09/2024   K 4.3 02/09/2024   CL 106 02/09/2024   CREATININE 1.57 (H) 02/09/2024   BUN 15 02/09/2024   CO2 30 02/09/2024   TSH 4.52 11/06/2023   PSA 0.01 (L) 09/14/2018   INR 1.5 (H) 03/25/2023   HGBA1C 5.7 11/06/2023    Assessment & Plan:    See Problem List for Assessment and Plan of chronic medical problems.

## 2024-05-10 ENCOUNTER — Ambulatory Visit (INDEPENDENT_AMBULATORY_CARE_PROVIDER_SITE_OTHER): Payer: Medicare HMO | Admitting: Internal Medicine

## 2024-05-10 ENCOUNTER — Ambulatory Visit: Payer: Self-pay | Admitting: Internal Medicine

## 2024-05-10 VITALS — BP 128/80 | HR 56 | Temp 97.7°F | Ht 76.0 in | Wt 195.0 lb

## 2024-05-10 DIAGNOSIS — R079 Chest pain, unspecified: Secondary | ICD-10-CM

## 2024-05-10 DIAGNOSIS — N1832 Chronic kidney disease, stage 3b: Secondary | ICD-10-CM

## 2024-05-10 DIAGNOSIS — R7303 Prediabetes: Secondary | ICD-10-CM | POA: Diagnosis not present

## 2024-05-10 DIAGNOSIS — I10 Essential (primary) hypertension: Secondary | ICD-10-CM

## 2024-05-10 DIAGNOSIS — E538 Deficiency of other specified B group vitamins: Secondary | ICD-10-CM

## 2024-05-10 DIAGNOSIS — I4819 Other persistent atrial fibrillation: Secondary | ICD-10-CM | POA: Diagnosis not present

## 2024-05-10 DIAGNOSIS — E785 Hyperlipidemia, unspecified: Secondary | ICD-10-CM | POA: Diagnosis not present

## 2024-05-10 DIAGNOSIS — I2510 Atherosclerotic heart disease of native coronary artery without angina pectoris: Secondary | ICD-10-CM

## 2024-05-10 DIAGNOSIS — M109 Gout, unspecified: Secondary | ICD-10-CM

## 2024-05-10 LAB — COMPREHENSIVE METABOLIC PANEL WITH GFR
ALT: 31 U/L (ref 0–53)
AST: 28 U/L (ref 0–37)
Albumin: 4.4 g/dL (ref 3.5–5.2)
Alkaline Phosphatase: 47 U/L (ref 39–117)
BUN: 18 mg/dL (ref 6–23)
CO2: 30 meq/L (ref 19–32)
Calcium: 9.6 mg/dL (ref 8.4–10.5)
Chloride: 102 meq/L (ref 96–112)
Creatinine, Ser: 1.68 mg/dL — ABNORMAL HIGH (ref 0.40–1.50)
GFR: 38.27 mL/min — ABNORMAL LOW (ref >60.00)
Glucose, Bld: 73 mg/dL (ref 70–99)
Potassium: 3.9 meq/L (ref 3.5–5.1)
Sodium: 138 meq/L (ref 135–145)
Total Bilirubin: 0.8 mg/dL (ref 0.2–1.2)
Total Protein: 7 g/dL (ref 6.0–8.3)

## 2024-05-10 LAB — CBC WITH DIFFERENTIAL/PLATELET
Basophils Absolute: 0 K/uL (ref 0.0–0.1)
Basophils Relative: 0.5 % (ref 0.0–3.0)
Eosinophils Absolute: 0.1 K/uL (ref 0.0–0.7)
Eosinophils Relative: 2.3 % (ref 0.0–5.0)
HCT: 40.9 % (ref 39.0–52.0)
Hemoglobin: 14 g/dL (ref 13.0–17.0)
Lymphocytes Relative: 29.4 % (ref 12.0–46.0)
Lymphs Abs: 1.6 K/uL (ref 0.7–4.0)
MCHC: 34.2 g/dL (ref 30.0–36.0)
MCV: 84.3 fl (ref 78.0–100.0)
Monocytes Absolute: 0.4 K/uL (ref 0.1–1.0)
Monocytes Relative: 7.7 % (ref 3.0–12.0)
Neutro Abs: 3.3 K/uL (ref 1.4–7.7)
Neutrophils Relative %: 60.1 % (ref 43.0–77.0)
Platelets: 119 K/uL — ABNORMAL LOW (ref 150.0–400.0)
RBC: 4.85 Mil/uL (ref 4.22–5.81)
RDW: 14.2 % (ref 11.5–15.5)
WBC: 5.4 K/uL (ref 4.0–10.5)

## 2024-05-10 LAB — LIPID PANEL
Cholesterol: 101 mg/dL (ref 0–200)
HDL: 41.9 mg/dL (ref >39.00)
LDL Cholesterol: 39 mg/dL (ref 0–99)
NonHDL: 58.69
Total CHOL/HDL Ratio: 2
Triglycerides: 100 mg/dL (ref 0.0–149.0)
VLDL: 20 mg/dL (ref 0.0–40.0)

## 2024-05-10 LAB — VITAMIN B12: Vitamin B-12: 762 pg/mL (ref 211–911)

## 2024-05-10 LAB — HEMOGLOBIN A1C: Hgb A1c MFr Bld: 5.7 % (ref 4.6–6.5)

## 2024-05-10 LAB — TSH: TSH: 4.75 u[IU]/mL (ref 0.35–5.50)

## 2024-05-10 MED ORDER — FAMOTIDINE 40 MG PO TABS: 40.0000 mg | ORAL_TABLET | Freq: Every day | ORAL | 3 refills | Status: DC

## 2024-05-10 NOTE — Assessment & Plan Note (Addendum)
 Chronic Following with cardiology CMP, CBC, TSH On Eliquis  5 mg twice daily, metoprolol  50 mg twice daily, diltiazem  240 mg daily

## 2024-05-10 NOTE — Addendum Note (Signed)
 Addended by: GEOFM GLADE PARAS on: 05/10/2024 12:53 PM   Modules accepted: Orders

## 2024-05-10 NOTE — Assessment & Plan Note (Addendum)
Chronic Stage IIIa Kidney function has remained stable Advised increased water intake, avoiding NSAIDs Stressed good blood pressure, sugar control Advised low-sodium diet CBC, CMP

## 2024-05-10 NOTE — Assessment & Plan Note (Addendum)
Chronic Taking B12 vitamins daily Check B12 level

## 2024-05-10 NOTE — Assessment & Plan Note (Signed)
 Chronic No active gout symptoms Controlled Continue allopurinol  100 mg daily for prevention

## 2024-05-10 NOTE — Assessment & Plan Note (Addendum)
 Chronic Blood pressure well controlled He does have occasional orthostatic lightheadedness if he stands up too quickly-does not occur often and he tries not to stand up too quickly Advised taking caution when standing and if this increases we will need to adjust his medications-Parkinson's could be contributing CMP Continue metoprolol  50 mg twice daily, diltiazem  240 mg daily

## 2024-05-10 NOTE — Assessment & Plan Note (Addendum)
 Chronic Lab Results  Component Value Date   LDLCALC 25 11/06/2023   Regular exercise and healthy diet encouraged Check lipid panel, TSH Continue Crestor  20 mg daily, Zetia  10 mg daily

## 2024-05-10 NOTE — Assessment & Plan Note (Addendum)
 Chronic Lab Results  Component Value Date   HGBA1C 5.7 11/06/2023   Check A1c Low sugar / carb diet Stressed regular exercise

## 2024-05-10 NOTE — Assessment & Plan Note (Addendum)
 New States chest pressure that has been occurring for a few months-notices it with activity and improves with rest Also has shortness of breath with activity that improves with rest History of CAD-concern for angina-referral to cardiology-needs to establish with new cardiologist Does have some gas bubbles at times when he has the chest pain and will take Tums and that seems to help-?  GERD.  Start Pepcid  40 mg daily He is sedentary and deconditioning could be contributing as well

## 2024-05-10 NOTE — Assessment & Plan Note (Addendum)
 Chronic Following with cardiology He is having some chest pain that occurs with activity and improves with rest.  Also states shortness of breath with activity that improves with rest. This could be angina He also is sedentary so deconditioning can play a role He has noted at times he has gas bubbles with the chest pain and Tums helps-could be GERD Referred to cardiology-needs to establish with a new cardiologist Start Pepcid  40 mg daily-advised to take until he sees cardiology, Tums as needed On Plavix  75 mg daily, Eliquis  5 mg twice daily, diltiazem  240 mg daily, metoprolol  50 mg twice daily, Zetia  10 mg daily, Crestor  20 mg daily

## 2024-06-02 ENCOUNTER — Telehealth: Payer: Self-pay | Admitting: Student in an Organized Health Care Education/Training Program

## 2024-06-02 DIAGNOSIS — I4819 Other persistent atrial fibrillation: Secondary | ICD-10-CM

## 2024-06-02 NOTE — Telephone Encounter (Signed)
 Prescription refill request for Eliquis  received. Indication: afib Last office visit: 11/10/23 Scr: 1.68 epic 05/10/24 Age: 78 Weight: 88kg

## 2024-06-02 NOTE — Telephone Encounter (Signed)
*  STAT* If patient is at the pharmacy, call can be transferred to refill team.   1. Which medications need to be refilled? (please list name of each medication and dose if known)   ELIQUIS  5 MG TABS tablet     2. Would you like to learn more about the convenience, safety, & potential cost savings by using the [facility]? No    3. Are you open to using the [facility] (Type [facility]. ). No   4. Which pharmacy/location (including street and city if local pharmacy) is medication to be sent to? [id number] [place] [place], [place] - [address]    5. Do they need a 30 day or 90 day supply? 30 day   Pt is out of medication.

## 2024-06-03 MED ORDER — APIXABAN 2.5 MG PO TABS: 2.5000 mg | ORAL_TABLET | Freq: Two times a day (BID) | ORAL | 1 refills | Status: AC

## 2024-06-03 NOTE — Telephone Encounter (Signed)
 Spoke with patients wife and made her aware of dose reduction

## 2024-06-03 NOTE — Telephone Encounter (Signed)
 Patient's wife called to follow up and wanted to make us  aware that the patient is out of medication. Please advise.

## 2024-06-03 NOTE — Telephone Encounter (Signed)
 Caller Radonna) is following-up on the status of patient's Eliquis  refill.  Caller noted patient is now completely out of this medication.

## 2024-06-12 NOTE — Assessment & Plan Note (Signed)
:: -   Can stop Plavix  while on chronic OAC with Eliquis ***

## 2024-06-12 NOTE — Progress Notes (Incomplete)
 Cardiology Office Note:   Date:  06/12/2024  ID:  [name], DOB 02-05-1946, MRN [id number] PCP: Geofm Glade PARAS, MD  [facility] Providers Cardiologist:  Aleene Passe, MD (Inactive) { Chief Complaint: No chief complaint on file.  Click to update primary MD,subspecialty MD or APP then REFRESH:1}   History of Present Illness:   [name] is a 78 y.o. male with a PMH of obstructive CAD c/b NSTEMI s/p PCIs to RCA (2011, 2016) and LAD (2015, 2020), chronic AF (on Eliquis ), HTN, HLD, CKD, gout, and prior TIA who presents for follow-up at the request of his PCP Dr. Glade Geofm.  He is a former patient of Dr. Passe.  The patient was last seen by Dr. Passe in clinic on 11/10/23 and per the chart review he was doing well.  More recently he followed up with his PCP Dr. Geofm on 05/10/2024 and reported having exertional chest pain***.  Given the symptoms he was referred back to cardiology for evaluation and thus he presents today to see me.  His last LHC was on 08/05/2019 which revealed a 90% mid LAD stenosis resulting in PCI.  Last echo showed normal systolic function.  ***   Past Medical History:  Diagnosis Date   Anemia, mild    CAD (coronary artery disease)    a. 02/2010 NSTEMI/PCI: PROMUS DES to mRCA. b. 06/05/14 Cath/PCI: pLAD 70%--> s/p PCI/DES (Promus DES);  c. 01/2015 Cath/PCI: LM nl, LAD 81m, patent prox stent, LCX patent prox stent, RCA 30p, 70m (3.5x20 Synergy DES). // Myoview  07/2019:  EF 48, atten artifact, no ischemia, intermediate risk (low EF)    CKD (chronic kidney disease), stage III (HCC)    Diastolic dysfunction    a. Echo 6/11: mild LVH, EF 55-60%, GR1DD, Trivial MR, mild LAE. b. echo 06/04/14: EF 55-60%, no WMA, GR1DD, Ao valve mildly calcifed, mild MR, mild LAE   History of pulmonary embolism    Hyperlipidemia    Hypertension    did not tolerate Lisinopril event at low dose   Melanoma (HCC)    a. 1987. b. 2012   Myocardial infarction (HCC)    Persistent atrial  fibrillation (HCC) 12/03/2018   Dx 11/2018 >> Apixaban  started // Echo 12/2018: EF 55-60, mod RVE, normal RVSF, mild LAE, trivial MR, mild TR, mod AoV sclerosis    Prostate cancer (HCC)    Dr Ottelin   Stroke [facility])    TIA (transient ischemic attack)    a. post cath and PCI on 06/05/2014 - no residual sequelae. // ?recurrent TIA 8/21 >> Carotid US  8/21: normal  // Echocardiogram 8/21: EF 55-60, no RWMA, normal RVSF, mild BAE, trivial MR, trivial AI    Vitamin B12 deficiency      ROS: ***   Studies Reviewed:    EKG: ***       Cardiac Studies & Procedures   ______________________________________________________________________________________________ CARDIAC CATHETERIZATION  CARDIAC CATHETERIZATION 08/05/2019  Conclusion  Mid RCA lesion is 25% stenosed.  Previously placed Prox RCA to Mid RCA stent (unknown type) is widely patent.  Previously placed Prox LAD to Mid LAD stent (unknown type) is widely patent.  Mid LAD lesion is 90% stenosed.  A drug-eluting stent was successfully placed using a STENT SYNERGY DES 2.5X24, postdilated to 2.75 mm.  Post intervention, there is a 0% residual stenosis.  LV end diastolic pressure is low.  There is no aortic valve stenosis.  Continue aggressive secondary prevention.  Restart Eliquis  tomorrow.  Continue Plavix  for  6 months.  No aspirin  given Eliquis .  Findings Coronary Findings Diagnostic  Dominance: Right  Left Anterior Descending Previously placed Prox LAD to Mid LAD stent (unknown type) is widely patent. Mid LAD lesion is 90% stenosed.  Left Circumflex The vessel exhibits minimal luminal irregularities.  Right Coronary Artery Previously placed Prox RCA to Mid RCA stent (unknown type) is widely patent. Mid RCA lesion is 25% stenosed.  Intervention  Mid LAD lesion Stent CATHETER LAUNCHER 6FR EBU 3 guide catheter was inserted. Lesion crossed with guidewire using a WIRE ASAHI PROWATER 180CM. Pre-stent angioplasty was  performed using a BALLOON EUPHORA RX 2.0X12. A drug-eluting stent was successfully placed using a STENT SYNERGY DES 2.5X24. Stent strut is well apposed. Post-stent angioplasty was performed using a BALLOON SAPPHIRE [place] [id number]. Post-Intervention Lesion Assessment The intervention was successful. Pre-interventional TIMI flow is 3. Post-intervention TIMI flow is 3. No complications occurred at this lesion. There is a 0% residual stenosis post intervention.   STRESS TESTS  MYOCARDIAL PERFUSION IMAGING 07/29/2019  Interpretation Summary  The left ventricular ejection fraction is mildly decreased (45-54%).  Nuclear stress EF: 48%.  No T wave inversion was noted during stress.  There was no ST segment deviation noted during stress.  Defect 1: There is a small defect of moderate severity present in the basal inferior and mid inferior location.  This is an intermediate risk study.  Small size, moderate intensity basal to mid inferior perfusion defect that is seen in the rest and stress supine images, but resolves in the stress upright images, this suggests bowel attenuation artifact. LVEF 48% with mild inferior hypokinesis - however, visually, the LVEF appears higher. Overall this is an intermediate risk study, but no significant reversible ischemia is suspected.   ECHOCARDIOGRAM  ECHOCARDIOGRAM COMPLETE 05/25/2020  Narrative ECHOCARDIOGRAM REPORT    Patient Name:   [name]  Date of Exam: 05/25/2020 Medical Rec #:  [id number]     Height:       76.0 in Accession #:    [id number]    Weight:       215.0 lb Date of Birth:  12-15-1945     BSA:          2.284 m Patient Age:    74 years      BP:           108/70 mmHg Patient Gender: M             HR:           78 bpm. Exam Location:  [address]  Procedure: 2D Echo, Cardiac Doppler and Color Doppler  Indications:    G45.9  History:        Patient has prior history of Echocardiogram examinations, most recent 08/05/2019. Previous  Myocardial Infarction and CAD, TIA, Arrythmias:Atrial Fibrillation; Risk Factors:Hypertension and Dyslipidemia.  Sonographer:    Elsie Bohr RDCS Referring Phys: [id number] GL[address] DASEN WEAVER  IMPRESSIONS   1. Left ventricular ejection fraction, by estimation, is 55 to 60%. The left ventricle has normal function. The left ventricle has no regional wall motion abnormalities. Left ventricular diastolic function could not be evaluated. 2. Right ventricular systolic function is normal. The right ventricular size is normal. 3. Left atrial size was mildly dilated. 4. Right atrial size was mildly dilated. 5. The mitral valve is normal in structure. Trivial mitral valve regurgitation. No evidence of mitral stenosis. 6. The aortic valve is tricuspid. Aortic valve regurgitation is trivial. Mild aortic valve sclerosis is present, with  no evidence of aortic valve stenosis.  FINDINGS Left Ventricle: Left ventricular ejection fraction, by estimation, is 55 to 60%. The left ventricle has normal function. The left ventricle has no regional wall motion abnormalities. The left ventricular internal cavity size was normal in size. There is no left ventricular hypertrophy. Left ventricular diastolic function could not be evaluated due to atrial fibrillation. Left ventricular diastolic function could not be evaluated.  Right Ventricle: The right ventricular size is normal. Right ventricular systolic function is normal.  Left Atrium: Left atrial size was mildly dilated.  Right Atrium: Right atrial size was mildly dilated.  Pericardium: There is no evidence of pericardial effusion.  Mitral Valve: The mitral valve is normal in structure. Normal mobility of the mitral valve leaflets. Trivial mitral valve regurgitation. No evidence of mitral valve stenosis.  Tricuspid Valve: The tricuspid valve is normal in structure. Tricuspid valve regurgitation is mild . No evidence of tricuspid stenosis.  Aortic Valve: The  aortic valve is tricuspid. Aortic valve regurgitation is trivial. Mild aortic valve sclerosis is present, with no evidence of aortic valve stenosis.  Pulmonic Valve: The pulmonic valve was normal in structure. Pulmonic valve regurgitation is trivial. No evidence of pulmonic stenosis.  Aorta: The aortic root is normal in size and structure.  Venous: The inferior vena cava was not well visualized.  IAS/Shunts: No atrial level shunt detected by color flow Doppler.   LEFT VENTRICLE PLAX 2D LVIDd:         4.90 cm  Diastology LVIDs:         3.50 cm  LV e' lateral:   12.68 cm/s LV PW:         1.00 cm  LV E/e' lateral: 6.4 LV IVS:        1.00 cm  LV e' medial:    7.73 cm/s LVOT diam:     2.20 cm  LV E/e' medial:  10.5 LV SV:         48 LV SV Index:   21 LVOT Area:     3.80 cm   RIGHT VENTRICLE RV S prime:     9.54 cm/s TAPSE (M-mode): 1.4 cm RVSP:           37.3 mmHg  LEFT ATRIUM             Index       RIGHT ATRIUM           Index LA diam:        4.20 cm 1.84 cm/m  RA Pressure: 3.00 mmHg LA Vol (A2C):   85.0 ml 37.21 ml/m RA Area:     21.60 cm LA Vol (A4C):   68.4 ml 29.94 ml/m RA Volume:   69.80 ml  30.56 ml/m LA Biplane Vol: 81.3 ml 35.59 ml/m AORTIC VALVE LVOT Vmax:   66.98 cm/s LVOT Vmean:  45.320 cm/s LVOT VTI:    0.126 m  AORTA Ao Root diam: 3.60 cm Ao Asc diam:  3.50 cm  MV E velocity: 81.18 cm/s  TRICUSPID VALVE TR Peak grad:   34.3 mmHg TR Vmax:        293.00 cm/s Estimated RAP:  3.00 mmHg RVSP:           37.3 mmHg  SHUNTS Systemic VTI:  0.13 m Systemic Diam: 2.20 cm  Redell Shallow MD Electronically signed by Redell Shallow MD Signature Date/Time: 05/25/2020/12:13:59 PM    Final    MONITORS  LONG TERM MONITOR (3-14 DAYS) 01/20/2019  Narrative  Atrial fibrillation  Rare PVCs or aberrantly conducted beats  No significant pauses or episodes of tachycardia        ______________________________________________________________________________________________      Risk Assessment/Calculations:   {Does this patient have ATRIAL FIBRILLATION?[phone]} No BP recorded.  {Refresh Note OR Click here to enter BP  :1}***        Physical Exam:   VS:  There were no vitals taken for this visit.   Wt Readings from Last 3 Encounters:  05/10/24 195 lb (88.5 kg)  04/05/24 197 lb (89.4 kg)  02/16/24 197 lb 6.4 oz (89.5 kg)     GEN: Well nourished, well developed in no acute distress NECK: No JVD; No carotid bruits CARDIAC: ***RRR, no murmurs, rubs, gallops RESPIRATORY:  Clear to auscultation without rales, wheezing or rhonchi  ABDOMEN: Soft, non-tender, non-distended EXTREMITIES:  No edema; No deformity    Assessment & Plan Coronary artery disease involving native coronary artery of native heart without angina pectoris :: - Can stop Plavix  while on chronic OAC with Eliquis *** Hyperlipidemia LDL goal <70 :: His cholesterol profile is pristine.  No changes to current regiment.  Primary hypertension  Longstanding persistent atrial fibrillation (HCC)       {Are you ordering a CV Procedure (e.g. stress test, cath, DCCV, TEE, etc)?   Press F2        :[id number]}   Signed, Georganna Archer, MD 06/12/2024 10:39 PM    [facility]

## 2024-06-12 NOTE — Assessment & Plan Note (Signed)
::   His cholesterol profile is pristine.  No changes to current regiment.

## 2024-06-13 ENCOUNTER — Encounter: Payer: Self-pay | Admitting: Student in an Organized Health Care Education/Training Program

## 2024-06-13 ENCOUNTER — Ambulatory Visit
Attending: Student in an Organized Health Care Education/Training Program | Admitting: Student in an Organized Health Care Education/Training Program

## 2024-06-13 VITALS — BP 122/68 | HR 82 | Ht 76.0 in | Wt 194.0 lb

## 2024-06-13 DIAGNOSIS — R0609 Other forms of dyspnea: Secondary | ICD-10-CM

## 2024-06-13 DIAGNOSIS — R634 Abnormal weight loss: Secondary | ICD-10-CM | POA: Diagnosis not present

## 2024-06-13 DIAGNOSIS — I2510 Atherosclerotic heart disease of native coronary artery without angina pectoris: Secondary | ICD-10-CM

## 2024-06-13 DIAGNOSIS — I4811 Longstanding persistent atrial fibrillation: Secondary | ICD-10-CM | POA: Diagnosis not present

## 2024-06-13 DIAGNOSIS — E785 Hyperlipidemia, unspecified: Secondary | ICD-10-CM | POA: Diagnosis not present

## 2024-06-13 DIAGNOSIS — I10 Essential (primary) hypertension: Secondary | ICD-10-CM | POA: Diagnosis not present

## 2024-06-13 NOTE — Patient Instructions (Signed)
   Follow-Up: At [facility], you and your health needs are our priority.  As part of our continuing mission to provide you with exceptional heart care, our providers are all part of one team.  This team includes your primary Cardiologist (physician) and Advanced Practice Providers or APPs (Physician Assistants and Nurse Practitioners) who all work together to provide you with the care you need, when you need it.  Your next appointment:   6 month(s)  Provider:   Georganna Archer, MD    We recommend signing up for the patient portal called MyChart.  Sign up information is provided on this After Visit Summary.  MyChart is used to connect with patients for Virtual Visits (Telemedicine).  Patients are able to view lab/test results, encounter notes, upcoming appointments, etc.  Non-urgent messages can be sent to your provider as well.   To learn more about what you can do with MyChart, go to [email or web address].

## 2024-06-13 NOTE — Assessment & Plan Note (Signed)
 His blood pressure is at goal.  No changes

## 2024-06-27 ENCOUNTER — Telehealth: Payer: Self-pay | Admitting: Neurology

## 2024-06-27 NOTE — Telephone Encounter (Signed)
 Pt.s wife wants to discuss assistance as he is getting worse with memory and other declining factors

## 2024-06-30 NOTE — Telephone Encounter (Signed)
 Called and left a message for patients wife to call office back

## 2024-07-01 ENCOUNTER — Telehealth: Payer: Self-pay | Admitting: Student in an Organized Health Care Education/Training Program

## 2024-07-01 ENCOUNTER — Other Ambulatory Visit (HOSPITAL_COMMUNITY): Payer: Self-pay

## 2024-07-01 ENCOUNTER — Telehealth: Payer: Self-pay | Admitting: Pharmacy Technician

## 2024-07-01 NOTE — Telephone Encounter (Signed)
 Patient Advocate Encounter   The patient was approved for a [employer] grant that will help cover the cost of eliquis  Total amount awarded, 7500.  Effective: 06/01/24 - 05/31/25   APW:[id number] ERW:[id number] Group:[id number] PI:[id number] [employer] ID: [id number]   Pharmacy provided with approval and processing information. Patient informed via telephone

## 2024-07-01 NOTE — Telephone Encounter (Signed)
 Pt c/o medication issue:  1. Name of Medication:   apixaban  (ELIQUIS ) 2.5 MG TABS tablet   2. How are you currently taking this medication (dosage and times per day)?   3. Are you having a reaction (difficulty breathing--STAT)?   4. What is your medication issue?   Wife Gerard) stated this medication is too expensive and is almost out of this medication.  Wife wants a call back regarding getting assistance with this medication or alternate medication.

## 2024-07-04 NOTE — Telephone Encounter (Signed)
 Patient stood up and was feeling weak and confused. Patients wife said he did not feel like himself

## 2024-07-04 NOTE — Telephone Encounter (Signed)
 Talked to patients wife and patient had an episode of confusion and not feeling well. He has not had another episode and I did ask if he had been tested for a UTI. Patients wife is to take him into the PCP for his check up and ask to be screened for a UIT

## 2024-07-07 NOTE — Telephone Encounter (Signed)
 Called patients wife back and let her know Dr. Martie recommendations

## 2024-07-12 DIAGNOSIS — N1832 Chronic kidney disease, stage 3b: Secondary | ICD-10-CM | POA: Diagnosis not present

## 2024-07-13 ENCOUNTER — Telehealth: Payer: Self-pay | Admitting: Neurology

## 2024-07-13 NOTE — Telephone Encounter (Signed)
 Wife called to let Dr.Tat know [name] had blood work done yesterday and a specimen done today. She believes Tat wanted him to have this done and she should be able to see it in [facility] [name] wants a call back to discuss

## 2024-07-14 NOTE — Telephone Encounter (Signed)
Called patients wife back ° °

## 2024-07-19 DIAGNOSIS — D638 Anemia in other chronic diseases classified elsewhere: Secondary | ICD-10-CM | POA: Diagnosis not present

## 2024-07-19 DIAGNOSIS — N2581 Secondary hyperparathyroidism of renal origin: Secondary | ICD-10-CM | POA: Diagnosis not present

## 2024-07-19 DIAGNOSIS — I129 Hypertensive chronic kidney disease with stage 1 through stage 4 chronic kidney disease, or unspecified chronic kidney disease: Secondary | ICD-10-CM | POA: Diagnosis not present

## 2024-07-19 DIAGNOSIS — N1832 Chronic kidney disease, stage 3b: Secondary | ICD-10-CM | POA: Diagnosis not present

## 2024-07-19 NOTE — Telephone Encounter (Signed)
Called patients wife back and left voicemail

## 2024-07-28 NOTE — Telephone Encounter (Signed)
 Patient is scheduled

## 2024-07-28 NOTE — Telephone Encounter (Signed)
 Spoke to patients wife. Patient is having hallucinations he is seeing people in the woods outside there house and and many other hallucinations she was telling me about. [facility] with [facility]ie [facility] the [facility] took a Ua sample and it was cleared no infection results under media. Pateitns wife desperate for some help with this issue.

## 2024-07-28 NOTE — Progress Notes (Unsigned)
 Assessment/Plan:   1.  Parkinsons disease, diagnosed February, 2025             - Continue carbidopa /levodopa  25/100, 1 tablet 3 times per day.  He is having some internal tremor which he thought may be from carbidopa /levodopa  but I told him its from the disease itself             -increase exercise.     2.  A-fib/history of PE             -on eliquis              -pt also on plavix .  Pt states unknown why on both of the above.  I reviewed recent cardiology notes, but did not actually make mention of either of the medications.  Patients cardiologist does prescribe both, however.  I am quite sure there is a good reason for both of the medications.  He and I did discuss increased risk of falls with Parkinson's, but his balance was actually quite good today.   3.  Hx melanoma             -follows with dermatology at [facility] dermatology             -He and I discussed that Parkinsons increases risk for melanoma.  4. MGUS  Following with-hematology/oncology  5.  Confusion with new onset hallucinations  - Unusual for Parkinsons disease to at this stage.  -has recently been taking benadryl.  D/c benadryl.  Okay to take claritin/allegra for allergy s/s  - Do labs today including complete UA with culture and sensitivity, CBC with differential, CMP, CT brain without  - Hold levodopa  over the weekend.  We will call them early next week (Monday or tues)  Subjective:   [name] was seen today in follow up for Parkinsons disease.  My previous records were reviewed prior to todays visit as well as outside records available to me.  Patient was seen in July and was doing well at that time.  However, they called about a month ago stating that he had an episode of weakness and confusion.  Some of this happened when he stood up and we told him to make sure that cardiology knew about it, but we also asked them to make sure that his primary care was aware just to make sure that he did not have some type of  infection.  He has not seen cardiology or primary care since that time.  However, he is not having hallucinations frequently.  He saw his nephrologist on October 14 and did have some blood work done for him.  I have reviewed the notes from his nephrologist.  They did not talk about confusion at that visit.  He did have some blood work.  His sodium was 139, potassium 4.4, chloride 101, CO2 25, BUN 14, creatinine 1.93.  His white blood cells were 4.8, hemoglobin 14.4, hematocrit 44 and platelets were 128.  His urinalysis was screened for blood, protein and white blood cells and were all negative.  Wife states today that when she first called in sept, he was not having hallucinations but rather delusions/paranoia.  He was accusing her of running around and being gay.  That was really only 2 episodes.  Wife states that episodes of hallucinations may have started last week but she also admits that her vision is bad so she wasn't sure if he was seeing things or not as he would say he  would see a bird or something.  Then it was a tractor and a sheep and she knew that it was not real.  That just started on Tuesday.  It just happened on tues and wed but nothing thurs or today per pt but wife states that when she came home from grocery yesterday or wed, he asked where the dog was and there was no dog.  He thinks the hallucinations are real.  He denies any new meds as does wife but right at the end of the visit, wife remembered that he had recently been taking benadryl, more over the last week.  Pt did take it this morning.  Current prescribed movement disorder medications: Carbidopa /levodopa  25/100, 1 tablet 3 times per day.  ALLERGIES:   Allergies  Allergen Reactions   Nitroglycerin  Other (See Comments)    Pt will not take.  States he was in hospital and after taking second NTG SL pt states I had to be shocked, I am scared of that medicine and will not take again.    Lidocaine  Other (See Comments)    Syncope  post palmer injection; probably vasovagal as no reaction to intraarticular Lidocaine  into knee    CURRENT MEDICATIONS:  Current Meds  Medication Sig   allopurinol  (ZYLOPRIM ) 100 MG tablet TAKE 1 TABLET BY MOUTH EVERY DAY   apixaban  (ELIQUIS ) 2.5 MG TABS tablet Take 1 tablet (2.5 mg total) by mouth 2 (two) times daily.   calcium  carbonate (TUMS - DOSED IN MG ELEMENTAL CALCIUM ) 500 MG chewable tablet Chew 2 tablets by mouth daily as needed for indigestion or heartburn.    carbidopa -levodopa  (SINEMET  IR) 25-100 MG tablet TAKE 1 TABLET BY MOUTH 3 (THREE) TIMES DAILY. 8AM/NOON/4PM   clopidogrel  (PLAVIX ) 75 MG tablet TAKE 1 TABLET BY MOUTH EVERY DAY WITH BREAKFAST   Cyanocobalamin  (VITAMIN B12 PO) Take 1 tablet by mouth every evening.   diltiazem  (CARDIZEM  CD) 240 MG 24 hr capsule Take 1 capsule (240 mg total) by mouth daily.   ezetimibe  (ZETIA ) 10 MG tablet TAKE 1 TABLET BY MOUTH EVERY DAY   famotidine  (PEPCID ) 40 MG tablet Take 1 tablet (40 mg total) by mouth daily.   fish oil-omega-3 fatty acids 1000 MG capsule Take 2 g by mouth 2 (two) times daily.     metoprolol  tartrate (LOPRESSOR ) 50 MG tablet Take 1 tablet (50 mg total) by mouth 2 (two) times daily.   Misc Natural Products (TART CHERRY ADVANCED PO) Take 1,000 mg by mouth daily.   nitroGLYCERIN  (NITROSTAT ) 0.4 MG SL tablet Place 1 tablet (0.4 mg total) under the tongue every 5 (five) minutes x 3 doses as needed for chest pain.   Potassium Gluconate 2 MEQ TABS Take 1 tablet by mouth daily.   rosuvastatin  (CRESTOR ) 20 MG tablet TAKE 1 TABLET BY MOUTH EVERY DAY     Objective:   PHYSICAL EXAMINATION:    VITALS:   Vitals:   07/29/24 1328  BP: 124/82  Pulse: 74  SpO2: 98%  Weight: 194 lb 12.8 oz (88.4 kg)     GEN:  The patient appears stated age and is in NAD. HEENT:  Normocephalic, atraumatic.  The mucous membranes are moist. The superficial temporal arteries are without ropiness or tenderness. CV:  RRR Lungs:  CTAB Neck/HEME:   There are no carotid bruits bilaterally.  Neurological examination:  Orientation: The patient is alert and oriented x3 but does have some confusion and is a bit more irritable  Cranial nerves: There is good facial symmetry  with no significant facial hypomimia. The speech is fluent and clear. Soft palate rises symmetrically and there is no tongue deviation. Hearing is intact to conversational tone. Sensation: Sensation is intact to light touch throughout Motor: Strength is at least antigravity x4.  Movement examination: Tone: There is mild increased tone in the RUE Abnormal movements: there is rare thumb tremor on the R today Coordination:  There is no decremation with RAM's, with any form of RAMS, including alternating supination and pronation of the forearm, hand opening and closing, finger taps, heel taps and toe taps.  Gait and Station: The patient has no difficulty arising out of a deep-seated chair without the use of the hands. The patient's stride length is good.    I have reviewed and interpreted the following labs independently    Chemistry      Component Value Date/Time   NA 138 05/10/2024 1108   NA 143 01/27/2023 0846   K 3.9 05/10/2024 1108   CL 102 05/10/2024 1108   CO2 30 05/10/2024 1108   BUN 18 05/10/2024 1108   BUN 16 01/27/2023 0846   CREATININE 1.68 (H) 05/10/2024 1108   CREATININE 1.57 (H) 02/09/2024 1106   CREATININE 1.53 (H) 02/07/2016 0745      Component Value Date/Time   CALCIUM  9.6 05/10/2024 1108   ALKPHOS 47 05/10/2024 1108   AST 28 05/10/2024 1108   AST 27 02/09/2024 1106   ALT 31 05/10/2024 1108   ALT 7 02/09/2024 1106   BILITOT 0.8 05/10/2024 1108   BILITOT 0.7 02/09/2024 1106       Lab Results  Component Value Date   WBC 5.4 05/10/2024   HGB 14.0 05/10/2024   HCT 40.9 05/10/2024   MCV 84.3 05/10/2024   PLT 119.0 (L) 05/10/2024    Lab Results  Component Value Date   TSH 4.75 05/10/2024     Total time spent on today's visit was 50  minutes, including both face-to-face time and nonface-to-face time.  Time included that spent on review of records (prior notes available to me/labs/imaging if pertinent), discussing treatment and goals, answering patient's questions and coordinating care.  Cc:  Geofm Glade PARAS, MD

## 2024-07-29 ENCOUNTER — Encounter: Payer: Self-pay | Admitting: Neurology

## 2024-07-29 ENCOUNTER — Ambulatory Visit: Admitting: Neurology

## 2024-07-29 ENCOUNTER — Other Ambulatory Visit

## 2024-07-29 VITALS — BP 124/82 | HR 74 | Wt 194.8 lb

## 2024-07-29 DIAGNOSIS — R4182 Altered mental status, unspecified: Secondary | ICD-10-CM | POA: Diagnosis not present

## 2024-07-29 DIAGNOSIS — Z5181 Encounter for therapeutic drug level monitoring: Secondary | ICD-10-CM

## 2024-07-29 DIAGNOSIS — R6889 Other general symptoms and signs: Secondary | ICD-10-CM

## 2024-07-29 DIAGNOSIS — G20A1 Parkinson's disease without dyskinesia, without mention of fluctuations: Secondary | ICD-10-CM

## 2024-07-29 NOTE — Patient Instructions (Addendum)
 Your provider has requested that you have labwork completed today. The lab is located on the Second floor at [address], within the [facility] office. When you get off the elevator, turn right and go in the [facility] [address]; the first brown door on the left.  Tell the ladies behind the desk that you are there for lab work. If you are not called within 15 minutes please check with the front desk.   Once you complete your labs you are free to go. You will receive a call or message via MyChart with your lab results.     Don't take carbidopa /levodopa  until we call you Monday or Tuesday NO MORE BENADRYL You and your wife need to put your meds in a pill box and let your wife help to make sure that they are taken on time/correctly each time.  Work together.  The physicians and staff at [facility] are committed to providing excellent care. You may receive a survey requesting feedback about your experience at our office. We strive to receive very good responses to the survey questions. If you feel that your experience would prevent you from giving the office a very good  response, please contact our office to try to remedy the situation. We may be reached at [phone]. Thank you for taking the time out of your busy day to complete the survey.

## 2024-07-30 LAB — COMPREHENSIVE METABOLIC PANEL WITH GFR
AG Ratio: 2.4 (calc) (ref 1.0–2.5)
ALT: 27 U/L (ref 9–46)
AST: 28 U/L (ref 10–35)
Albumin: 4.5 g/dL (ref 3.6–5.1)
Alkaline phosphatase (APISO): 52 U/L (ref 35–144)
BUN/Creatinine Ratio: 10 (calc) (ref 6–22)
BUN: 16 mg/dL (ref 7–25)
CO2: 29 mmol/L (ref 20–32)
Calcium: 9.3 mg/dL (ref 8.6–10.3)
Chloride: 102 mmol/L (ref 98–110)
Creat: 1.62 mg/dL — ABNORMAL HIGH (ref 0.70–1.22)
Globulin: 1.9 g/dL (ref 1.9–3.7)
Glucose, Bld: 73 mg/dL (ref 65–99)
Potassium: 4.7 mmol/L (ref 3.5–5.3)
Sodium: 136 mmol/L (ref 135–146)
Total Bilirubin: 0.6 mg/dL (ref 0.2–1.2)
Total Protein: 6.4 g/dL (ref 6.1–8.1)
eGFR: 43 mL/min/1.73m2 — ABNORMAL LOW (ref >=60)

## 2024-07-30 LAB — URINALYSIS W MICROSCOPIC + REFLEX CULTURE
Bacteria, UA: NONE SEEN /HPF
Bilirubin Urine: NEGATIVE
Glucose, UA: NEGATIVE
Hgb urine dipstick: NEGATIVE
Hyaline Cast: NONE SEEN /LPF
Ketones, ur: NEGATIVE
Leukocyte Esterase: NEGATIVE
Nitrites, Initial: NEGATIVE
Protein, ur: NEGATIVE
RBC / HPF: NONE SEEN /HPF (ref 0–2)
Specific Gravity, Urine: 1.012 (ref 1.001–1.035)
Squamous Epithelial / HPF: NONE SEEN /HPF (ref <=5)
WBC, UA: NONE SEEN /HPF (ref 0–5)
pH: 6.5 (ref 5.0–8.0)

## 2024-07-30 LAB — CBC WITH DIFFERENTIAL/PLATELET
Absolute Lymphocytes: 1618 {cells}/uL (ref 850–3900)
Absolute Monocytes: 346 {cells}/uL (ref 200–950)
Basophils Absolute: 19 {cells}/uL (ref 0–200)
Basophils Relative: 0.4 %
Eosinophils Absolute: 82 {cells}/uL (ref 15–500)
Eosinophils Relative: 1.7 %
HCT: 42.3 % (ref 38.5–50.0)
Hemoglobin: 13.9 g/dL (ref 13.2–17.1)
MCH: 28.8 pg (ref 27.0–33.0)
MCHC: 32.9 g/dL (ref 32.0–36.0)
MCV: 87.6 fL (ref 80.0–100.0)
MPV: 9.6 fL (ref 7.5–12.5)
Monocytes Relative: 7.2 %
Neutro Abs: 2736 {cells}/uL (ref 1500–7800)
Neutrophils Relative %: 57 %
Platelets: 116 Thousand/uL — ABNORMAL LOW (ref 140–400)
RBC: 4.83 Million/uL (ref 4.20–5.80)
RDW: 13.2 % (ref 11.0–15.0)
Total Lymphocyte: 33.7 %
WBC: 4.8 Thousand/uL (ref 3.8–10.8)

## 2024-07-30 LAB — NO CULTURE INDICATED

## 2024-08-01 ENCOUNTER — Telehealth: Payer: Self-pay | Admitting: Neurology

## 2024-08-01 ENCOUNTER — Ambulatory Visit: Payer: Self-pay | Admitting: Neurology

## 2024-08-01 NOTE — Telephone Encounter (Signed)
 Called and left a message.

## 2024-08-01 NOTE — Telephone Encounter (Signed)
 Patient doing well so far. He has not had any hallucinations over the weekend. Patient stopped both carbidopa  levodopa  and benedryl

## 2024-08-01 NOTE — Telephone Encounter (Signed)
-----   Message from Amanda Trino Higinbotham sent at 07/29/2024  2:16 PM EDT ----- Call them and find out how he did over the weekend with holding levodopa

## 2024-08-02 ENCOUNTER — Ambulatory Visit
Admission: RE | Admit: 2024-08-02 | Discharge: 2024-08-02 | Disposition: A | Discharge status: Home / Self Care | Source: Ambulatory Visit | Attending: Neurology | Admitting: Neurology

## 2024-08-02 DIAGNOSIS — R4182 Altered mental status, unspecified: Secondary | ICD-10-CM

## 2024-08-02 DIAGNOSIS — Z5181 Encounter for therapeutic drug level monitoring: Secondary | ICD-10-CM

## 2024-08-02 DIAGNOSIS — R6889 Other general symptoms and signs: Secondary | ICD-10-CM

## 2024-08-02 NOTE — Telephone Encounter (Signed)
 Called patients wife back and patient is restarting carbidopa  levodopa 

## 2024-08-02 NOTE — Telephone Encounter (Signed)
 I let patients wife know results

## 2024-08-03 ENCOUNTER — Telehealth: Payer: Self-pay | Admitting: Neurology

## 2024-08-03 DIAGNOSIS — N1832 Chronic kidney disease, stage 3b: Secondary | ICD-10-CM | POA: Diagnosis not present

## 2024-08-03 NOTE — Telephone Encounter (Signed)
 Pt's wife called in this afternoon, to see if it is ok for her husband  (pt) to take the prescription called : famotidine  (PEPCID ) 40 MG tablet due to he has Parkinson. Thanks

## 2024-08-04 NOTE — Telephone Encounter (Signed)
Called and left patient's wife a message

## 2024-08-18 ENCOUNTER — Inpatient Hospital Stay

## 2024-08-18 ENCOUNTER — Inpatient Hospital Stay: Attending: Internal Medicine | Admitting: Internal Medicine

## 2024-08-18 VITALS — BP 133/83 | HR 90 | Temp 97.6°F | Resp 17 | Ht 76.0 in | Wt 192.0 lb

## 2024-08-18 DIAGNOSIS — N183 Chronic kidney disease, stage 3 unspecified: Secondary | ICD-10-CM | POA: Diagnosis not present

## 2024-08-18 DIAGNOSIS — I6782 Cerebral ischemia: Secondary | ICD-10-CM | POA: Diagnosis not present

## 2024-08-18 DIAGNOSIS — D472 Monoclonal gammopathy: Secondary | ICD-10-CM | POA: Diagnosis not present

## 2024-08-18 DIAGNOSIS — Z8673 Personal history of transient ischemic attack (TIA), and cerebral infarction without residual deficits: Secondary | ICD-10-CM | POA: Diagnosis not present

## 2024-08-18 DIAGNOSIS — I2510 Atherosclerotic heart disease of native coronary artery without angina pectoris: Secondary | ICD-10-CM | POA: Diagnosis not present

## 2024-08-18 DIAGNOSIS — Z9049 Acquired absence of other specified parts of digestive tract: Secondary | ICD-10-CM | POA: Diagnosis not present

## 2024-08-18 DIAGNOSIS — Z79899 Other long term (current) drug therapy: Secondary | ICD-10-CM | POA: Diagnosis not present

## 2024-08-18 DIAGNOSIS — Z9089 Acquired absence of other organs: Secondary | ICD-10-CM | POA: Diagnosis not present

## 2024-08-18 DIAGNOSIS — Z86711 Personal history of pulmonary embolism: Secondary | ICD-10-CM | POA: Diagnosis not present

## 2024-08-18 DIAGNOSIS — Z9079 Acquired absence of other genital organ(s): Secondary | ICD-10-CM | POA: Diagnosis not present

## 2024-08-18 DIAGNOSIS — I4819 Other persistent atrial fibrillation: Secondary | ICD-10-CM | POA: Diagnosis not present

## 2024-08-18 DIAGNOSIS — Z7901 Long term (current) use of anticoagulants: Secondary | ICD-10-CM | POA: Diagnosis not present

## 2024-08-18 DIAGNOSIS — D696 Thrombocytopenia, unspecified: Secondary | ICD-10-CM | POA: Diagnosis not present

## 2024-08-18 DIAGNOSIS — I252 Old myocardial infarction: Secondary | ICD-10-CM | POA: Diagnosis not present

## 2024-08-18 DIAGNOSIS — I129 Hypertensive chronic kidney disease with stage 1 through stage 4 chronic kidney disease, or unspecified chronic kidney disease: Secondary | ICD-10-CM | POA: Diagnosis not present

## 2024-08-18 DIAGNOSIS — Z85820 Personal history of malignant melanoma of skin: Secondary | ICD-10-CM | POA: Diagnosis not present

## 2024-08-18 LAB — CBC WITH DIFFERENTIAL (CANCER CENTER ONLY)
Abs Immature Granulocytes: 0 K/uL (ref 0.00–0.07)
Basophils Absolute: 0 K/uL (ref 0.0–0.1)
Basophils Relative: 1 %
Eosinophils Absolute: 0.1 K/uL (ref 0.0–0.5)
Eosinophils Relative: 2 %
HCT: 40.9 % (ref 39.0–52.0)
Hemoglobin: 14.1 g/dL (ref 13.0–17.0)
Immature Granulocytes: 0 %
Lymphocytes Relative: 30 %
Lymphs Abs: 1.2 K/uL (ref 0.7–4.0)
MCH: 28.4 pg (ref 26.0–34.0)
MCHC: 34.5 g/dL (ref 30.0–36.0)
MCV: 82.5 fL (ref 80.0–100.0)
Monocytes Absolute: 0.3 K/uL (ref 0.1–1.0)
Monocytes Relative: 7 %
Neutro Abs: 2.4 K/uL (ref 1.7–7.7)
Neutrophils Relative %: 60 %
Platelet Count: 102 K/uL — ABNORMAL LOW (ref 150–400)
RBC: 4.96 MIL/uL (ref 4.22–5.81)
RDW: 13.5 % (ref 11.5–15.5)
WBC Count: 4 K/uL (ref 4.0–10.5)
nRBC: 0 % (ref 0.0–0.2)

## 2024-08-18 LAB — CMP (CANCER CENTER ONLY)
ALT: 28 U/L (ref 0–44)
AST: 28 U/L (ref 15–41)
Albumin: 4.4 g/dL (ref 3.5–5.0)
Alkaline Phosphatase: 46 U/L (ref 38–126)
Anion gap: 4 — ABNORMAL LOW (ref 5–15)
BUN: 15 mg/dL (ref 8–23)
CO2: 31 mmol/L (ref 22–32)
Calcium: 9.6 mg/dL (ref 8.9–10.3)
Chloride: 106 mmol/L (ref 98–111)
Creatinine: 1.74 mg/dL — ABNORMAL HIGH (ref 0.61–1.24)
GFR, Estimated: 39 mL/min — ABNORMAL LOW (ref >60)
Glucose, Bld: 98 mg/dL (ref 70–99)
Potassium: 4 mmol/L (ref 3.5–5.1)
Sodium: 141 mmol/L (ref 135–145)
Total Bilirubin: 0.8 mg/dL (ref 0.0–1.2)
Total Protein: 7 g/dL (ref 6.5–8.1)

## 2024-08-18 LAB — LACTATE DEHYDROGENASE: LDH: 157 U/L (ref 105–235)

## 2024-08-18 NOTE — Progress Notes (Signed)
 [facility] [phone]   [phone]  OFFICE PROGRESS NOTE  Brian Glade PARAS, MD [address] [place] [name] [id number]  DIAGNOSIS: Monoclonal gammopathy of undetermined significance  PRIOR THERAPY: None  CURRENT THERAPY: Observation   INTERVAL HISTORY: [name] 78 y.o. male returns to the clinic today for 78-month follow-up visit accompanied by his wife, [name]. Discussed the use of AI scribe software for clinical note transcription with the patient, who gave verbal consent to proceed.  History of Present Illness [name] is an 78 year old male with monoclonal gammopathy of undetermined significance who presents for evaluation and repeat myeloma panel. He is accompanied by his wife, [name].  He is currently under observation for monoclonal gammopathy of undetermined significance. No new symptoms or complaints have arisen since his last visit six months ago.  He underwent lab work earlier today, including a complete blood count (CBC). His platelet count has been low in previous tests. He is awaiting the rest of the myeloma panel results.  No chest pain, breathing issues, nausea, vomiting, or diarrhea.     MEDICAL HISTORY: Past Medical History:  Diagnosis Date   Anemia, mild    CAD (coronary artery disease)    a. 02/2010 NSTEMI/PCI: PROMUS DES to mRCA. b. 06/05/14 Cath/PCI: pLAD 70%--> s/p PCI/DES (Promus DES);  c. 01/2015 Cath/PCI: LM nl, LAD 47m, patent prox stent, LCX patent prox stent, RCA 30p, 58m (3.5x20 Synergy DES). // Myoview  07/2019:  EF 48, atten artifact, no ischemia, intermediate risk (low EF)    CKD (chronic kidney disease), stage III (HCC)    Diastolic dysfunction    a. Echo 6/11: mild LVH, EF 55-60%, GR1DD, Trivial MR, mild LAE. b. echo 06/04/14: EF 55-60%, no WMA, GR1DD, Ao valve mildly calcifed, mild MR, mild LAE   History of pulmonary embolism    Hyperlipidemia    Hypertension    did not tolerate Lisinopril event at low  dose   Melanoma (HCC)    a. 1987. b. 2012   Myocardial infarction (HCC)    Persistent atrial fibrillation (HCC) 12/03/2018   Dx 11/2018 >> Apixaban  started // Echo 12/2018: EF 55-60, mod RVE, normal RVSF, mild LAE, trivial MR, mild TR, mod AoV sclerosis    Prostate cancer (HCC)    Dr Ottelin   Stroke [facility])    TIA (transient ischemic attack)    a. post cath and PCI on 06/05/2014 - no residual sequelae. // ?recurrent TIA 8/21 >> Carotid US  8/21: normal  // Echocardiogram 8/21: EF 55-60, no RWMA, normal RVSF, mild BAE, trivial MR, trivial AI    Vitamin B12 deficiency     ALLERGIES:  is allergic to nitroglycerin  and lidocaine .  MEDICATIONS:  Current Outpatient Medications  Medication Sig Dispense Refill   allopurinol  (ZYLOPRIM ) 100 MG tablet TAKE 1 TABLET BY MOUTH EVERY DAY 90 tablet 3   apixaban  (ELIQUIS ) 2.5 MG TABS tablet Take 1 tablet (2.5 mg total) by mouth 2 (two) times daily. 180 tablet 1   calcium  carbonate (TUMS - DOSED IN MG ELEMENTAL CALCIUM ) 500 MG chewable tablet Chew 2 tablets by mouth daily as needed for indigestion or heartburn.      carbidopa -levodopa  (SINEMET  IR) 25-100 MG tablet TAKE 1 TABLET BY MOUTH 3 (THREE) TIMES DAILY. 8AM/NOON/4PM 270 tablet 0   clopidogrel  (PLAVIX ) 75 MG tablet TAKE 1 TABLET BY MOUTH EVERY DAY WITH BREAKFAST 90 tablet 3   Cyanocobalamin  (VITAMIN B12 PO) Take 1 tablet by  mouth every evening.     diltiazem  (CARDIZEM  CD) 240 MG 24 hr capsule Take 1 capsule (240 mg total) by mouth daily. 90 capsule 3   ezetimibe  (ZETIA ) 10 MG tablet TAKE 1 TABLET BY MOUTH EVERY DAY 90 tablet 3   famotidine  (PEPCID ) 40 MG tablet Take 1 tablet (40 mg total) by mouth daily. 30 tablet 3   fish oil-omega-3 fatty acids 1000 MG capsule Take 2 g by mouth 2 (two) times daily.       metoprolol  tartrate (LOPRESSOR ) 50 MG tablet Take 1 tablet (50 mg total) by mouth 2 (two) times daily. 180 tablet 3   Misc Natural Products (TART CHERRY ADVANCED PO) Take 1,000 mg by mouth daily.      nitroGLYCERIN  (NITROSTAT ) 0.4 MG SL tablet Place 1 tablet (0.4 mg total) under the tongue every 5 (five) minutes x 3 doses as needed for chest pain. 25 tablet 4   Potassium Gluconate 2 MEQ TABS Take 1 tablet by mouth daily.     rosuvastatin  (CRESTOR ) 20 MG tablet TAKE 1 TABLET BY MOUTH EVERY DAY 90 tablet 3   No current facility-administered medications for this visit.    SURGICAL HISTORY:  Past Surgical History:  Procedure Laterality Date   CHOLECYSTECTOMY     COLONOSCOPY  10/06/2004   CORONARY STENT INTERVENTION N/A 08/05/2019   Procedure: CORONARY STENT INTERVENTION;  Surgeon: Dann Candyce RAMAN, MD;  Location: [facility] INVASIVE CV LAB;  Service: Cardiovascular;  Laterality: N/A;   KNEE ARTHROSCOPY     right   LEFT HEART CATH AND CORONARY ANGIOGRAPHY N/A 08/05/2019   Procedure: LEFT HEART CATH AND CORONARY ANGIOGRAPHY;  Surgeon: Dann Candyce RAMAN, MD;  Location[facility] INVASIVE CV LAB;  Service: Cardiovascular;  Laterality: N/A;   LEFT HEART CATHETERIZATION WITH CORONARY ANGIOGRAM N/A 06/05/2014   Procedure: LEFT HEART CATHETERIZATION WITH CORONARY ANGIOGRAM;  Surgeon: Ozell JONETTA Fell, MD;  Location: [facility] CATH LAB;  Service: Cardiovascular;  Laterality: N/A;   LEFT HEART CATHETERIZATION WITH CORONARY ANGIOGRAM N/A 01/19/2015   Procedure: LEFT HEART CATHETERIZATION WITH CORONARY ANGIOGRAM;  Surgeon: Peter M Jordan, MD;  Location: [facility] CATH LAB;  Service: Cardiovascular;  Laterality: N/A;   MELANOMA EXCISION  10/06/2009   L chest   MELANOMA EXCISION  10/06/1989   back   mRCA stent  02/03/2010   PROSTATECTOMY     Dr. Ottelin   TONSILLECTOMY      REVIEW OF SYSTEMS:  A comprehensive review of systems was negative except for: Neurological: positive for tremors   PHYSICAL EXAMINATION: General appearance: alert, cooperative, and no distress Head: Normocephalic, without obvious abnormality, atraumatic Neck: no adenopathy, no JVD, supple, symmetrical, trachea midline, and thyroid  not enlarged,  symmetric, no tenderness/mass/nodules Lymph nodes: Cervical, supraclavicular, and axillary nodes normal. Resp: clear to auscultation bilaterally Back: symmetric, no curvature. ROM normal. No CVA tenderness. Cardio: regular rate and rhythm, S1, S2 normal, no murmur, click, rub or gallop GI: soft, non-tender; bowel sounds normal; no masses,  no organomegaly Extremities: extremities normal, atraumatic, no cyanosis or edema  ECOG PERFORMANCE STATUS: 1 - Symptomatic but completely ambulatory  Blood pressure 133/83, pulse 90, temperature 97.6 F (36.4 C), temperature source Temporal, resp. rate 17, height 6' 4 (1.93 m), weight 192 lb (87.1 kg), SpO2 100%.  LABORATORY DATA: Lab Results  Component Value Date   WBC 4.0 08/18/2024   HGB 14.1 08/18/2024   HCT 40.9 08/18/2024   MCV 82.5 08/18/2024   PLT 102 (L) 08/18/2024      Chemistry  Component Value Date/Time   NA 136 07/29/2024 1438   NA 143 01/27/2023 0846   K 4.7 07/29/2024 1438   CL 102 07/29/2024 1438   CO2 29 07/29/2024 1438   BUN 16 07/29/2024 1438   BUN 16 01/27/2023 0846   CREATININE 1.62 (H) 07/29/2024 1438      Component Value Date/Time   CALCIUM  9.3 07/29/2024 1438   ALKPHOS 47 05/10/2024 1108   AST 28 07/29/2024 1438   AST 27 02/09/2024 1106   ALT 27 07/29/2024 1438   ALT 7 02/09/2024 1106   BILITOT 0.6 07/29/2024 1438   BILITOT 0.7 02/09/2024 1106       RADIOGRAPHIC STUDIES: CT HEAD WO CONTRAST ( ) Result Date: 08/02/2024 CLINICAL DATA:  Mental status change.  No trauma. EXAM: CT HEAD WITHOUT CONTRAST TECHNIQUE: Contiguous axial images were obtained from the base of the skull through the vertex without intravenous contrast. RADIATION DOSE REDUCTION: This exam was performed according to the departmental dose-optimization program which includes automated exposure control, adjustment of the mA and/or kV according to patient size and/or use of iterative reconstruction technique. COMPARISON:  05/15/2020  FINDINGS: Brain: Ventricles, cisterns and other CSF spaces are normal. There is mild chronic ischemic microvascular disease. No mass, mass effect, shift of midline structures or acute hemorrhage. No acute infarction. Vascular: No hyperdense vessel or unexpected calcification. Skull: Normal. Negative for fracture or focal lesion. Sinuses/Orbits: No acute finding. Other: None. IMPRESSION: 1. No acute findings. 2. Mild chronic ischemic microvascular disease. Electronically Signed   By: Toribio Agreste M.D.   On: 08/02/2024 14:15    ASSESSMENT AND PLAN: This is a very pleasant 78 years old white male with history of monoclonal gammopathy of undetermined significance (MGUS). The patient is currently on observation and he is feeling fine with no concerning complaints. The patient had repeat myeloma panel performed earlier today.  His CBC showed thrombocytopenia but the rest of his blood work is still pending. 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 CBC shows low platelets, consistent with chronic thrombocytopenia. Awaiting remaining myeloma panel results. - Await remaining myeloma panel results - Scheduled follow-up in one year unless lab results indicate earlier intervention  Chronic thrombocytopenia Platelet count remains low, consistent with chronic thrombocytopenia. No acute issues reported. - Continue monitoring platelet levels He was advised to call immediately if he has any concerning symptoms in the interval.  The patient voices understanding of current disease status and treatment options and is in agreement with the current care plan.  All questions were answered. The patient knows to call the clinic with any problems, questions or concerns. We can certainly see the patient much sooner if necessary.  The total time spent in the  appointment was 20 minutes.  Disclaimer: This note was dictated with voice recognition software. Similar sounding words can inadvertently be transcribed and may not be corrected upon review.

## 2024-08-19 ENCOUNTER — Telehealth: Payer: Self-pay | Admitting: Internal Medicine

## 2024-08-19 LAB — KAPPA/LAMBDA LIGHT CHAINS
Kappa free light chain: 24.5 mg/L — ABNORMAL HIGH (ref 3.3–19.4)
Kappa, lambda light chain ratio: 2.99 — ABNORMAL HIGH (ref 0.26–1.65)
Lambda free light chains: 8.2 mg/L (ref 5.7–26.3)

## 2024-08-19 LAB — BETA 2 MICROGLOBULIN, SERUM: Beta-2 Microglobulin: 3.4 mg/L — ABNORMAL HIGH (ref 0.6–2.4)

## 2024-08-19 NOTE — Telephone Encounter (Signed)
 Scheduled patient for next appointment. Called and left a voicemail with the details.

## 2024-08-22 DIAGNOSIS — H25813 Combined forms of age-related cataract, bilateral: Secondary | ICD-10-CM | POA: Diagnosis not present

## 2024-08-22 DIAGNOSIS — H524 Presbyopia: Secondary | ICD-10-CM | POA: Diagnosis not present

## 2024-08-22 DIAGNOSIS — H353131 Nonexudative age-related macular degeneration, bilateral, early dry stage: Secondary | ICD-10-CM | POA: Diagnosis not present

## 2024-08-22 DIAGNOSIS — H40013 Open angle with borderline findings, low risk, bilateral: Secondary | ICD-10-CM | POA: Diagnosis not present

## 2024-08-22 DIAGNOSIS — H52223 Regular astigmatism, bilateral: Secondary | ICD-10-CM | POA: Diagnosis not present

## 2024-08-22 LAB — MULTIPLE MYELOMA PANEL, SERUM
Albumin SerPl Elph-Mcnc: 3.7 g/dL (ref 2.9–4.4)
Albumin/Glob SerPl: 1.4 (ref 0.7–1.7)
Alpha 1: 0.3 g/dL (ref 0.0–0.4)
Alpha2 Glob SerPl Elph-Mcnc: 0.7 g/dL (ref 0.4–1.0)
B-Globulin SerPl Elph-Mcnc: 0.9 g/dL (ref 0.7–1.3)
Gamma Glob SerPl Elph-Mcnc: 0.9 g/dL (ref 0.4–1.8)
Globulin, Total: 2.8 g/dL (ref 2.2–3.9)
IgA: 59 mg/dL — ABNORMAL LOW (ref 61–437)
IgG (Immunoglobin G), Serum: 1118 mg/dL (ref 603–1613)
IgM (Immunoglobulin M), Srm: 14 mg/dL — ABNORMAL LOW (ref 15–143)
M Protein SerPl Elph-Mcnc: 0.6 g/dL — ABNORMAL HIGH
Total Protein ELP: 6.5 g/dL (ref 6.0–8.5)

## 2024-09-13 ENCOUNTER — Telehealth: Payer: Self-pay | Admitting: Neurology

## 2024-09-13 NOTE — Telephone Encounter (Signed)
 Called patients wife and he is having bad hallucinations he is very angry and having severe mood swings. Patients wife said no UTI and he has not been sick. She is frantic on phone and would like some help on next steps

## 2024-09-13 NOTE — Telephone Encounter (Signed)
 Pt.'s wife would like a call bk, she is having concerns about [place] and would like to know next steps, no other Rx's used currently -hallucination (ppl and animals) -Mood swings

## 2024-09-14 ENCOUNTER — Other Ambulatory Visit: Payer: Self-pay

## 2024-09-14 ENCOUNTER — Ambulatory Visit: Payer: Self-pay

## 2024-09-14 DIAGNOSIS — G20A1 Parkinson's disease without dyskinesia, without mention of fluctuations: Secondary | ICD-10-CM

## 2024-09-14 MED ORDER — CARBIDOPA-LEVODOPA ER 25-100 MG PO TBCR: EXTENDED_RELEASE_TABLET | ORAL | 0 refills | Status: AC

## 2024-09-14 NOTE — Telephone Encounter (Signed)
 FYI Only or Action Required?: Action required by provider: request for appointment.  Patient was last seen in primary care on 05/10/2024 by Geofm Glade PARAS, MD.  Called Nurse Triage reporting Urinary symptoms.  Symptoms began several days ago.  Interventions attempted: Nothing.  Symptoms are: unchanged. Wife reports pt. Confused, having hallucinations. States his Parkinson's doctor said to see PCP for urine check. Appointment made.  Triage Disposition: See PCP Within 2 Weeks  Patient/caregiver understands and will follow disposition?: Yes    Copied from CRM [id number]. Topic: Clinical - Red Word Triage >> Sep 14, 2024 11:02 AM [name] wrote: Red Word that prompted transfer to Nurse Triage: Patient's wife, [name], stated Dr. Bruna, patient's Parkinson doctor, would like for patient to have a urine test with PCP  Symptoms: hallucinations, accusations, Possible UTI Reason for Disposition  All other urine symptoms  Answer Assessment - Initial Assessment Questions 1. SYMPTOM: What's the main symptom you're concerned about? (e.g., frequency, incontinence)     Confusion, hallucinations 2. ONSET: When did the    start?     Friday 3. PAIN: Is there any pain? If Yes, ask: How bad is it? (Scale: 1-10; mild, moderate, severe)     NO 4. CAUSE: What do you think is causing the symptoms?     UTI 5. OTHER SYMPTOMS: Do you have any other symptoms? (e.g., blood in urine, fever, flank pain, pain with urination)     NO 6. PREGNANCY: Is there any chance you are pregnant? When was your last menstrual period?     N/A  Protocols used: Urinary Symptoms-A-AH

## 2024-09-14 NOTE — Telephone Encounter (Signed)
 Changed med and sent in to the pharmacy took IR out of chart. Called patients wife and let her know to reach out to the PCP

## 2024-09-15 ENCOUNTER — Ambulatory Visit

## 2024-09-15 ENCOUNTER — Ambulatory Visit: Payer: Self-pay | Admitting: Adult Health

## 2024-09-15 ENCOUNTER — Ambulatory Visit: Admitting: Adult Health

## 2024-09-15 ENCOUNTER — Encounter: Payer: Self-pay | Admitting: Adult Health

## 2024-09-15 ENCOUNTER — Telehealth: Payer: Self-pay | Admitting: Neurology

## 2024-09-15 VITALS — BP 120/80 | HR 85 | Temp 97.7°F | Ht 76.0 in | Wt 195.0 lb

## 2024-09-15 DIAGNOSIS — G20A2 Parkinson's disease without dyskinesia, with fluctuations: Secondary | ICD-10-CM

## 2024-09-15 DIAGNOSIS — R410 Disorientation, unspecified: Secondary | ICD-10-CM | POA: Diagnosis not present

## 2024-09-15 DIAGNOSIS — R443 Hallucinations, unspecified: Secondary | ICD-10-CM

## 2024-09-15 DIAGNOSIS — R4586 Emotional lability: Secondary | ICD-10-CM | POA: Diagnosis not present

## 2024-09-15 DIAGNOSIS — I700 Atherosclerosis of aorta: Secondary | ICD-10-CM | POA: Diagnosis not present

## 2024-09-15 LAB — CBC
HCT: 41.1 % (ref 39.0–52.0)
Hemoglobin: 14 g/dL (ref 13.0–17.0)
MCHC: 34.1 g/dL (ref 30.0–36.0)
MCV: 83.1 fl (ref 78.0–100.0)
Platelets: 122 K/uL — ABNORMAL LOW (ref 150.0–400.0)
RBC: 4.95 Mil/uL (ref 4.22–5.81)
RDW: 14.3 % (ref 11.5–15.5)
WBC: 4.5 K/uL (ref 4.0–10.5)

## 2024-09-15 LAB — POCT URINALYSIS DIPSTICK
Bilirubin, UA: NEGATIVE
Glucose, UA: NEGATIVE
Ketones, UA: NEGATIVE
Leukocytes, UA: NEGATIVE
Nitrite, UA: NEGATIVE
Protein, UA: NEGATIVE
Spec Grav, UA: 1.015 (ref 1.010–1.025)
Urobilinogen, UA: 0.2 U/dL
pH, UA: 6 (ref 5.0–8.0)

## 2024-09-15 LAB — BASIC METABOLIC PANEL WITH GFR
BUN: 18 mg/dL (ref 6–23)
CO2: 29 meq/L (ref 19–32)
Calcium: 9.9 mg/dL (ref 8.4–10.5)
Chloride: 101 meq/L (ref 96–112)
Creatinine, Ser: 1.78 mg/dL — ABNORMAL HIGH (ref 0.40–1.50)
GFR: 35.61 mL/min — ABNORMAL LOW (ref >60.00)
Glucose, Bld: 78 mg/dL (ref 70–99)
Potassium: 3.7 meq/L (ref 3.5–5.1)
Sodium: 136 meq/L (ref 135–145)

## 2024-09-15 NOTE — Telephone Encounter (Signed)
 Called patients wife and with everything going on she has not picked up the CR meds. She said she needs help with something to calm him down and stop the hallucinations. Last night he kept saying how the house was full of people and patients wife said no one in the house. Patients wife said she can not go a whole weekend without any help. They went to PCP today for Labs and UA thye also did a CT of chest. UA came back with no UTI but blood was present but patients wife said Dr. Keenan said no UTI and I didn't say anything about blood being present

## 2024-09-15 NOTE — Telephone Encounter (Signed)
 Pt.'s sister calling again wanting to speak with [facility] or Tat

## 2024-09-15 NOTE — Telephone Encounter (Signed)
 Pt's sister Heron called in this morning and pt  is having real bad hallucination. Pt drove in the middle of the night  and went to see his sister Heron and it was 12.10am. and  [name] the wife did not know where he was at. She was getting ready  to call Police.Heron is not on the [facility]. Pt is at the doctor's office trying to see if he has a UTI today with Dr. Joane to see if the UTI is causing the issues pt is having if it is a UTI. Thanks

## 2024-09-15 NOTE — Progress Notes (Signed)
 Subjective:    Patient ID: [name], male    DOB: 05-Oct-1946, 78 y.o.   MRN: [id number]  HPI 78 year old male who  has a past medical history of Anemia, mild, CAD (coronary artery disease), CKD (chronic kidney disease), stage III (HCC), Diastolic dysfunction, History of pulmonary embolism, Hyperlipidemia, Hypertension, Melanoma (HCC), Myocardial infarction (HCC), Persistent atrial fibrillation (HCC) (12/03/2018), Prostate cancer (HCC), Stroke (HCC), TIA (transient ischemic attack), and Vitamin B12 deficiency.   Discussed the use of AI scribe software for clinical note transcription with the patient, who gave verbal consent to proceed.  History of Present Illness   [name] is an 78 year old male with Parkinson's disease who presents with hallucinations and mood swings.    He is a patient of Dr. Geofm  Since last Friday he has had worsening hallucinations and mood swings, which became severe over the weekend. He sees people who disappear when he approaches them, causing significant distress and leading him to leave his home when he feels overwhelmed. He denies fever, chills, dysuria, urinary frequency, or urgency. He has chronic shortness of breath but no chest or abdominal pain.    His wife reports that she reached out to his Neurologist who wanted him to have a test for urinary tract infection       Review of Systems See HPI   Past Medical History:  Diagnosis Date   Anemia, mild    CAD (coronary artery disease)    a. 02/2010 NSTEMI/PCI: PROMUS DES to mRCA. b. 06/05/14 Cath/PCI: pLAD 70%--> s/p PCI/DES (Promus DES);  c. 01/2015 Cath/PCI: LM nl, LAD 58m, patent prox stent, LCX patent prox stent, RCA 30p, 29m (3.5x20 Synergy DES). // Myoview  07/2019:  EF 48, atten artifact, no ischemia, intermediate risk (low EF)    CKD (chronic kidney disease), stage III (HCC)    Diastolic dysfunction    a. Echo 6/11: mild LVH, EF 55-60%, GR1DD, Trivial MR, mild LAE. b. echo 06/04/14: EF 55-60%,  no WMA, GR1DD, Ao valve mildly calcifed, mild MR, mild LAE   History of pulmonary embolism    Hyperlipidemia    Hypertension    did not tolerate Lisinopril event at low dose   Melanoma (HCC)    a. 1987. b. 2012   Myocardial infarction (HCC)    Persistent atrial fibrillation (HCC) 12/03/2018   Dx 11/2018 >> Apixaban  started // Echo 12/2018: EF 55-60, mod RVE, normal RVSF, mild LAE, trivial MR, mild TR, mod AoV sclerosis    Prostate cancer (HCC)    Dr Ottelin   Stroke [facility])    TIA (transient ischemic attack)    a. post cath and PCI on 06/05/2014 - no residual sequelae. // ?recurrent TIA 8/21 >> Carotid US  8/21: normal  // Echocardiogram 8/21: EF 55-60, no RWMA, normal RVSF, mild BAE, trivial MR, trivial AI    Vitamin B12 deficiency     Social History   Socioeconomic History   Marital status: Married    Spouse name: Arlean   Number of children: 2   Years of education: Not on file   Highest education level: Not on file  Occupational History   Occupation: Retired    Comment: telephone company  Tobacco Use   Smoking status: Never   Smokeless tobacco: Never  Vaping Use   Vaping status: Never Used  Substance and Sexual Activity   Alcohol use: No    Alcohol/week: 0.0 standard drinks of alcohol   Drug use: No  Sexual activity: Not on file  Other Topics Concern   Not on file  Social History Narrative   Right handed    Retied    Caffeine- 2-3 cups per day    Lives with wife   Social Drivers of Health   Tobacco Use: Low Risk (09/15/2024)   Patient History    Smoking Tobacco Use: Never    Smokeless Tobacco Use: Never    Passive Exposure: Not on file  Financial Resource Strain: Low Risk (10/09/2023)   Overall Financial Resource Strain (CARDIA)    Difficulty of Paying Living Expenses: Not hard at all  Food Insecurity: No Food Insecurity (10/09/2023)   Hunger Vital Sign    Worried About Running Out of Food in the Last Year: Never true    Ran Out of Food in the Last Year: Never true   Transportation Needs: No Transportation Needs (10/09/2023)   PRAPARE - Administrator, Civil Service (Medical): No    Lack of Transportation (Non-Medical): No  Physical Activity: Inactive (10/09/2023)   Exercise Vital Sign    Days of Exercise per Week: 0 days    Minutes of Exercise per Session: 0 min  Stress: No Stress Concern Present (10/09/2023)   [employer] of Occupational Health - Occupational Stress Questionnaire    Feeling of Stress : Not at all  Social Connections: Moderately Isolated (10/09/2023)   Social Connection and Isolation Panel    Frequency of Communication with Friends and Family: Twice a week    Frequency of Social Gatherings with Friends and Family: Twice a week    Attends Religious Services: Never    Database Administrator or Organizations: No    Attends Banker Meetings: Never    Marital Status: Married  Catering Manager Violence: Not At Risk (10/09/2023)   Humiliation, Afraid, Rape, and Kick questionnaire    Fear of Current or Ex-Partner: No    Emotionally Abused: No    Physically Abused: No    Sexually Abused: No  Depression (PHQ2-9): High Risk (05/10/2024)   Depression (PHQ2-9)    PHQ-2 Score: 15  Alcohol Screen: Low Risk (10/09/2023)   Alcohol Screen    Last Alcohol Screening Score (AUDIT): 0  Housing: Unknown (10/09/2023)   Housing Stability Vital Sign    Unable to Pay for Housing in the Last Year: No    Number of Times Moved in the Last Year: Not on file    Homeless in the Last Year: No  Utilities: Not At Risk (10/09/2023)   AHC Utilities    Threatened with loss of utilities: No  Health Literacy: Adequate Health Literacy (10/09/2023)   B1300 Health Literacy    Frequency of need for help with medical instructions: Never    Past Surgical History:  Procedure Laterality Date   CHOLECYSTECTOMY     COLONOSCOPY  10/06/2004   CORONARY STENT INTERVENTION N/A 08/05/2019   Procedure: CORONARY STENT INTERVENTION;  Surgeon: Dann Candyce RAMAN, MD;  Location: [facility] INVASIVE CV LAB;  Service: Cardiovascular;  Laterality: N/A;   KNEE ARTHROSCOPY     right   LEFT HEART CATH AND CORONARY ANGIOGRAPHY N/A 08/05/2019   Procedure: LEFT HEART CATH AND CORONARY ANGIOGRAPHY;  Surgeon: Dann Candyce RAMAN, MD;  Location: [facility] INVASIVE CV LAB;  Service: Cardiovascular;  Laterality: N/A;   LEFT HEART CATHETERIZATION WITH CORONARY ANGIOGRAM N/A 06/05/2014   Procedure: LEFT HEART CATHETERIZATION WITH CORONARY ANGIOGRAM;  Surgeon: Ozell JONETTA Fell, MD;  Location: [facility] CATH LAB;  Service: Cardiovascular;  Laterality: N/A;   LEFT HEART CATHETERIZATION WITH CORONARY ANGIOGRAM N/A 01/19/2015   Procedure: LEFT HEART CATHETERIZATION WITH CORONARY ANGIOGRAM;  Surgeon: Peter M Jordan, MD;  Location: [facility] CATH LAB;  Service: Cardiovascular;  Laterality: N/A;   MELANOMA EXCISION  10/06/2009   L chest   MELANOMA EXCISION  10/06/1989   back   mRCA stent  02/03/2010   PROSTATECTOMY     Dr. Ottelin   TONSILLECTOMY      Family History  Problem Relation Age of Onset   Coronary artery disease Mother        S/P CABG, in her 19s   Transient ischemic attack Mother 78   Heart disease Mother    Prostate cancer Father    Diabetes Sister    Hypertension Brother    Parkinson's disease Maternal Aunt    Healthy Child     Allergies[1]  Medications Ordered Prior to Encounter[2]  BP 120/80   Pulse 85   Temp 97.7 F (36.5 C) (Oral)   Ht 6' 4 (1.93 m)   Wt 195 lb (88.5 kg)   SpO2 97%   BMI 23.74 kg/m       Objective:   Physical Exam Vitals and nursing note reviewed.  Constitutional:      Appearance: Normal appearance.  Cardiovascular:     Rate and Rhythm: Normal rate and regular rhythm.     Pulses: Normal pulses.     Heart sounds: Normal heart sounds.  Pulmonary:     Effort: Pulmonary effort is normal.     Breath sounds: Normal breath sounds.  Skin:    General: Skin is warm and dry.  Neurological:     Mental Status: He is alert. Mental  status is at baseline.     Motor: Tremor present.  Psychiatric:        Mood and Affect: Mood normal.        Behavior: Behavior normal.        Thought Content: Thought content normal.        Judgment: Judgment normal.        Assessment & Plan:  1. Parkinson's disease with fluctuating manifestations, unspecified whether dyskinesia present (HCC) (Primary) - Per neurology   2. Hallucinations - POC UA negative. Will check CBC/CM and chest xray today  - Follow up with neurology as directed  - POC Urinalysis Dipstick - CBC; Future - Basic Metabolic Panel; Future - DG Chest 2 View; Future - Basic Metabolic Panel - CBC  3. Mood swings  - POC Urinalysis Dipstick - CBC; Future - Basic Metabolic Panel; Future - DG Chest 2 View; Future - Basic Metabolic Panel - CBC  Darleene Shape, NP  I personally spent a total of 35 minutes in the care of the patient today including preparing to see the patient, getting/reviewing separately obtained history, performing a medically appropriate exam/evaluation, counseling and educating, placing orders, and documenting clinical information in the EHR.      [1]  Allergies Allergen Reactions   Nitroglycerin  Other (See Comments)    Pt will not take.  States he was in hospital and after taking second NTG SL pt states I had to be shocked, I am scared of that medicine and will not take again.    Lidocaine  Other (See Comments)    Syncope post palmer injection; probably vasovagal as no reaction to intraarticular Lidocaine  into knee  [2]  Current Outpatient Medications on File Prior to Visit  Medication Sig Dispense Refill  allopurinol  (ZYLOPRIM ) 100 MG tablet TAKE 1 TABLET BY MOUTH EVERY DAY 90 tablet 3   apixaban  (ELIQUIS ) 2.5 MG TABS tablet Take 1 tablet (2.5 mg total) by mouth 2 (two) times daily. 180 tablet 1   calcium  carbonate (TUMS - DOSED IN MG ELEMENTAL CALCIUM ) 500 MG chewable tablet Chew 2 tablets by mouth daily as needed for indigestion  or heartburn.      Carbidopa -Levodopa  ER (SINEMET  CR) 25-100 MG tablet controlled release Take 1 pill three times a day at 8 AM / 12 PM and 4 PM 270 tablet 0   clopidogrel  (PLAVIX ) 75 MG tablet TAKE 1 TABLET BY MOUTH EVERY DAY WITH BREAKFAST 90 tablet 3   Cyanocobalamin  (VITAMIN B12 PO) Take 1 tablet by mouth every evening.     diltiazem  (CARDIZEM  CD) 240 MG 24 hr capsule Take 1 capsule (240 mg total) by mouth daily. 90 capsule 3   ezetimibe  (ZETIA ) 10 MG tablet TAKE 1 TABLET BY MOUTH EVERY DAY 90 tablet 3   famotidine  (PEPCID ) 40 MG tablet Take 1 tablet (40 mg total) by mouth daily. 30 tablet 3   fish oil-omega-3 fatty acids 1000 MG capsule Take 2 g by mouth 2 (two) times daily.       metoprolol  tartrate (LOPRESSOR ) 50 MG tablet Take 1 tablet (50 mg total) by mouth 2 (two) times daily. 180 tablet 3   Misc Natural Products (TART CHERRY ADVANCED PO) Take 1,000 mg by mouth daily.     nitroGLYCERIN  (NITROSTAT ) 0.4 MG SL tablet Place 1 tablet (0.4 mg total) under the tongue every 5 (five) minutes x 3 doses as needed for chest pain. 25 tablet 4   Potassium Gluconate 2 MEQ TABS Take 1 tablet by mouth daily.     rosuvastatin  (CRESTOR ) 20 MG tablet TAKE 1 TABLET BY MOUTH EVERY DAY 90 tablet 3   No current facility-administered medications on file prior to visit.

## 2024-09-16 NOTE — Telephone Encounter (Signed)
 Called patients wife and she stated patient seems a little calmer today. He is on the CR and I am going to call Monday to check on him

## 2024-09-20 ENCOUNTER — Telehealth: Payer: Self-pay | Admitting: Neurology

## 2024-09-20 NOTE — Telephone Encounter (Signed)
 Patients wife is calling to let us  know that patient is not any better on the CR and in fact is just as bad as before. He is aggressive and having mood swings patients wife feels it is dementia. Patient  left home last night again and drove to his brothers house. Patients wife has had him evaluated at PCP for labs UA and chest Xray. No findings . Patients wife saying they can not go on much longer this way

## 2024-09-20 NOTE — Telephone Encounter (Signed)
 Pt's wife called this afternoon and she stated that Pt is in real bad shape. The wife wants a return call to discuss what is going on with Pt. Thanks

## 2024-09-20 NOTE — Telephone Encounter (Signed)
 Pt.'s wife Cld looking for a return call from [facility] no details on voicemail

## 2024-09-21 ENCOUNTER — Telehealth: Payer: Self-pay | Admitting: Neurology

## 2024-09-21 NOTE — Telephone Encounter (Signed)
 Pt. Wife cld Access Nurse 5:21pm states she is still awaiting a call back from 3 prior calls

## 2024-09-21 NOTE — Telephone Encounter (Signed)
 Called patients wife LVMTRC.

## 2024-09-21 NOTE — Telephone Encounter (Signed)
 Ann called back and lvm. She stated she missed a call from [employer].  She stated it's getting very scary, with him taking off and going places at night/ in the middle of the night. He doesn't need to be driving, it can be dangerous.   PH: [phone]

## 2024-09-21 NOTE — Telephone Encounter (Signed)
Called patients wife back and left voicemail message  

## 2024-09-21 NOTE — Telephone Encounter (Signed)
Called patient's wife and left voice mail message.

## 2024-09-21 NOTE — Telephone Encounter (Signed)
 Pt's wife [name] is returning call. Please call.Thanks

## 2024-09-22 ENCOUNTER — Telehealth: Payer: Self-pay | Admitting: Neurology

## 2024-09-22 NOTE — Telephone Encounter (Signed)
 Called patietns wife and gave all recommendations

## 2024-09-22 NOTE — Telephone Encounter (Signed)
 Called and left voicemail message.

## 2024-09-22 NOTE — Telephone Encounter (Signed)
 Patients wife very frustrated patient is getting out of the house even with the doors locked he went out a sliding glass door in the middle of the night. Patients wife wanting to know if Neurocog testing can be done anything to help evaluate him. She is asking for medications for his memory or something to calm him down at night. She feels that the dementia has really taken hold of him at his time. He has stopped carbidopa  levodopa  and it has been 24 hours. Patients wife wanting a call back and states she can not go through another weekend like this

## 2024-09-22 NOTE — Telephone Encounter (Signed)
 Pt's wife called in and left a message. She missed a call from [facility] and is eager to speak with her. again

## 2024-09-22 NOTE — Telephone Encounter (Signed)
 Pt's wife called in and left a message. She missed a call from [facility] and is eager to speak with her.

## 2024-09-26 ENCOUNTER — Ambulatory Visit: Payer: Self-pay

## 2024-09-26 ENCOUNTER — Telehealth: Payer: Self-pay | Admitting: Neurology

## 2024-09-26 NOTE — Telephone Encounter (Signed)
 Pt.s wife calling again for another call back, Pt had two more episodes(left home Thursday night and Sunday night) The Rx removed has not helped the situation if anything he is worse

## 2024-09-26 NOTE — Telephone Encounter (Signed)
 Called patients wife and talked with her about the limited access we have this week and to try to call Dr. Geofm for some help until we can get through the holiday and be able to get patient a appointment. If Dr. Geofm can not see the patient then she will have to take him to UR or ED

## 2024-09-26 NOTE — Telephone Encounter (Signed)
 FYI Only or Action Required?: FYI only for provider: appointment scheduled on 09/27/24.  Patient was last seen in primary care on 09/15/2024 by Merna Huxley, NP.  Called Nurse Triage reporting Hallucinations.  Symptoms began several weeks ago.  Interventions attempted: Prescription medications:  SABRA  Symptoms are: gradually worsening.  Triage Disposition: See Physician Within 24 Hours  Patient/caregiver understands and will follow disposition?: Yes    Copied from CRM #[id number]. Topic: Clinical - Red Word Triage >> Sep 26, 2024  4:17 PM Fonda T wrote: [facility] that prompted transfer to Nurse Triage: Pt spouse, calling states pt has Parkinson's and having worsening symptoms including increased hallucinations, and mood swings.  Pt spouse reports that it is becoming exhausting as they have had to leave their home around 5:00 am every morning for the last week or so due to symptoms, and to make him comfortable, states she is tired as well as pt.  Requesting medication Seroquel , for pt or something that can calm pt down to allow th both of them to get rest.  Pt requesting further directives.   Pt. Spouse  ph [phone]   Reason for Disposition  [1] Hallucinations AND [2] getting WORSE (e.g., sleeping poorly, less able to do activities of daily living)  Answer Assessment - Initial Assessment Questions 1. MAIN CONCERN: What happened that made you call today?     Pt's wife, [name], contacted clinic to schedule appt. States that pt is seeing a neurologist and they discussed addition of Seroquel  but reported PCP would need to manage rx. Pt's wife reports increase in hallucinations and mood swings causing interruptions in sleep pattern. Pt states she is exhausted from trying to manage symptoms and make sure pt stays safe. Pt reports her daughter is a therapist, music and they discussed medication with her and she recommended [name] f/u with PCP for medication and eval. Discussed no appts with PCP  until 10/2024 but that appts available with alternative provider in office. Appt made and reassured her I would update Dr. Geofm. Appointment scheduled for evaluation. Patient's wife agrees with plan of care, and will call back if anything changes, or if symptoms worsen.  Protocols used: Hallucinations-A-AH

## 2024-09-27 ENCOUNTER — Emergency Department (HOSPITAL_COMMUNITY)

## 2024-09-27 ENCOUNTER — Emergency Department (HOSPITAL_COMMUNITY)
Admission: EM | Admit: 2024-09-27 | Discharge: 2024-09-27 | Disposition: A | Discharge status: Home / Self Care | Source: Ambulatory Visit

## 2024-09-27 ENCOUNTER — Encounter (HOSPITAL_COMMUNITY): Payer: Self-pay | Admitting: Radiology

## 2024-09-27 ENCOUNTER — Telehealth: Payer: Self-pay | Admitting: Neurology

## 2024-09-27 ENCOUNTER — Telehealth: Payer: Self-pay

## 2024-09-27 ENCOUNTER — Ambulatory Visit: Admitting: Family Medicine

## 2024-09-27 DIAGNOSIS — R443 Hallucinations, unspecified: Secondary | ICD-10-CM | POA: Diagnosis not present

## 2024-09-27 DIAGNOSIS — I4891 Unspecified atrial fibrillation: Secondary | ICD-10-CM | POA: Insufficient documentation

## 2024-09-27 DIAGNOSIS — G20C Parkinsonism, unspecified: Secondary | ICD-10-CM | POA: Insufficient documentation

## 2024-09-27 DIAGNOSIS — I10 Essential (primary) hypertension: Secondary | ICD-10-CM | POA: Diagnosis not present

## 2024-09-27 DIAGNOSIS — Z7901 Long term (current) use of anticoagulants: Secondary | ICD-10-CM | POA: Diagnosis not present

## 2024-09-27 LAB — CBC WITH DIFFERENTIAL/PLATELET
Abs Immature Granulocytes: 0.01 K/uL (ref 0.00–0.07)
Basophils Absolute: 0 K/uL (ref 0.0–0.1)
Basophils Relative: 0 %
Eosinophils Absolute: 0 K/uL (ref 0.0–0.5)
Eosinophils Relative: 1 %
HCT: 42.4 % (ref 39.0–52.0)
Hemoglobin: 14.3 g/dL (ref 13.0–17.0)
Immature Granulocytes: 0 %
Lymphocytes Relative: 24 %
Lymphs Abs: 1.2 K/uL (ref 0.7–4.0)
MCH: 28.7 pg (ref 26.0–34.0)
MCHC: 33.7 g/dL (ref 30.0–36.0)
MCV: 85 fL (ref 80.0–100.0)
Monocytes Absolute: 0.3 K/uL (ref 0.1–1.0)
Monocytes Relative: 6 %
Neutro Abs: 3.5 K/uL (ref 1.7–7.7)
Neutrophils Relative %: 69 %
Platelets: 113 K/uL — ABNORMAL LOW (ref 150–400)
RBC: 4.99 MIL/uL (ref 4.22–5.81)
RDW: 13.7 % (ref 11.5–15.5)
WBC: 5.2 K/uL (ref 4.0–10.5)
nRBC: 0 % (ref 0.0–0.2)

## 2024-09-27 LAB — URINALYSIS, ROUTINE W REFLEX MICROSCOPIC
Bacteria, UA: NONE SEEN
Bilirubin Urine: NEGATIVE
Glucose, UA: NEGATIVE mg/dL
Hgb urine dipstick: NEGATIVE
Ketones, ur: NEGATIVE mg/dL
Leukocytes,Ua: NEGATIVE
Nitrite: NEGATIVE
Protein, ur: 30 mg/dL — AB
Specific Gravity, Urine: 1.027 (ref 1.005–1.030)
pH: 5 (ref 5.0–8.0)

## 2024-09-27 LAB — COMPREHENSIVE METABOLIC PANEL WITH GFR
ALT: 32 U/L (ref 0–44)
AST: 30 U/L (ref 15–41)
Albumin: 4.4 g/dL (ref 3.5–5.0)
Alkaline Phosphatase: 51 U/L (ref 38–126)
Anion gap: 10 (ref 5–15)
BUN: 16 mg/dL (ref 8–23)
CO2: 25 mmol/L (ref 22–32)
Calcium: 9.8 mg/dL (ref 8.9–10.3)
Chloride: 102 mmol/L (ref 98–111)
Creatinine, Ser: 1.56 mg/dL — ABNORMAL HIGH (ref 0.61–1.24)
GFR, Estimated: 45 mL/min — ABNORMAL LOW
Glucose, Bld: 78 mg/dL (ref 70–99)
Potassium: 4 mmol/L (ref 3.5–5.1)
Sodium: 137 mmol/L (ref 135–145)
Total Bilirubin: 0.8 mg/dL (ref 0.0–1.2)
Total Protein: 7.1 g/dL (ref 6.5–8.1)

## 2024-09-27 MED ORDER — QUETIAPINE FUMARATE 25 MG PO TABS: 25.0000 mg | ORAL_TABLET | Freq: Every day | ORAL | 0 refills | Status: DC

## 2024-09-27 NOTE — ED Notes (Signed)
 Pt ambulatory in hallway. Wife present.

## 2024-09-27 NOTE — Telephone Encounter (Unsigned)
 Copied from CRM #[id number]. Topic: Clinical - Medical Advice >> Sep 27, 2024  9:28 AM Viola F wrote: Reason for CRM: Patient spouse [name] requesting that [facility] call [facility] to let them know that she's bringing patient and he's going to need a Neurologist - she asked for a call back to let her know if that could be done. [phone]

## 2024-09-27 NOTE — ED Notes (Signed)
 Pt attempted to provide urine sample, but sample was insufficient.

## 2024-09-27 NOTE — ED Provider Notes (Signed)
 "  EMERGENCY DEPARTMENT AT [facility] Provider Note   CSN: [id number] Arrival date & time: 09/27/24  1103     Patient presents with: Hallucinations   [name] is a 78 y.o. male with a history of Parkinson's disease who has a worsening presentation of hallucinations associated with his Parkinson's that is cause distress.  He has been try to get in with his neurologist for the past 2 weeks however they have been unsuccessful, also has history of persistent A-fib, B12 deficiency, hyperlipidemia.  No complaints of acute neurologic changes, specifically he ambulates without assistance using a normal gait, he is not having any communication issues, no speech difficulty, does have baseline hearing deficits.  Currently taking Sinemet ,   HPI     Prior to Admission medications  Medication Sig Start Date End Date Taking? Authorizing Provider  QUEtiapine  (SEROQUEL ) 25 MG tablet Take 1 tablet (25 mg total) by mouth at bedtime. 09/27/24  Yes Myriam Carrier C, PA  allopurinol  (ZYLOPRIM ) 100 MG tablet TAKE 1 TABLET BY MOUTH EVERY DAY 11/09/23   Geofm Glade PARAS, MD  apixaban  (ELIQUIS ) 2.5 MG TABS tablet Take 1 tablet (2.5 mg total) by mouth 2 (two) times daily. 06/03/24   Jordan, Peter M, MD  calcium  carbonate (TUMS - DOSED IN MG ELEMENTAL CALCIUM ) 500 MG chewable tablet Chew 2 tablets by mouth daily as needed for indigestion or heartburn.     [provider]  Carbidopa -Levodopa  ER (SINEMET  CR) 25-100 MG tablet controlled release Take 1 pill three times a day at 8 AM / 12 PM and 4 PM 09/14/24   Tat, Rebecca S, DO  clopidogrel  (PLAVIX ) 75 MG tablet TAKE 1 TABLET BY MOUTH EVERY DAY WITH BREAKFAST 11/10/23   Nahser, Aleene PARAS, MD  Cyanocobalamin  (VITAMIN B12 PO) Take 1 tablet by mouth every evening.    [provider]  diltiazem  (CARDIZEM  CD) 240 MG 24 hr capsule Take 1 capsule (240 mg total) by mouth daily. 02/03/24   Nahser, Aleene PARAS, MD  ezetimibe  (ZETIA ) 10 MG tablet  TAKE 1 TABLET BY MOUTH EVERY DAY 02/03/24   Nahser, Aleene PARAS, MD  famotidine  (PEPCID ) 40 MG tablet Take 1 tablet (40 mg total) by mouth daily. 05/10/24   Geofm Glade PARAS, MD  fish oil-omega-3 fatty acids 1000 MG capsule Take 2 g by mouth 2 (two) times daily.      [provider]  metoprolol  tartrate (LOPRESSOR ) 50 MG tablet Take 1 tablet (50 mg total) by mouth 2 (two) times daily. 11/10/23   Nahser, Aleene PARAS, MD  Misc Natural Products (TART CHERRY ADVANCED PO) Take 1,000 mg by mouth daily.    [provider]  nitroGLYCERIN  (NITROSTAT ) 0.4 MG SL tablet Place 1 tablet (0.4 mg total) under the tongue every 5 (five) minutes x 3 doses as needed for chest pain. 08/06/19   Marylu Leita SAUNDERS, NP  Potassium Gluconate 2 MEQ TABS Take 1 tablet by mouth daily.    [provider]  rosuvastatin  (CRESTOR ) 20 MG tablet TAKE 1 TABLET BY MOUTH EVERY DAY 01/28/24   Nahser, Aleene PARAS, MD    Allergies: Nitroglycerin  and Lidocaine     Review of Systems  Updated Vital Signs BP 130/83 (BP Location: Right Arm)   Pulse 72   Temp 97.9 F (36.6 C) (Oral)   Resp 16   SpO2 100%   Physical Exam  (all labs ordered are listed, but only abnormal results are displayed) Labs Reviewed  COMPREHENSIVE METABOLIC PANEL WITH  GFR - Abnormal; Notable for the following components:      Result Value   Creatinine, Ser 1.56 (*)    GFR, Estimated 45 (*)    All other components within normal limits  CBC WITH DIFFERENTIAL/PLATELET - Abnormal; Notable for the following components:   Platelets 113 (*)    All other components within normal limits  URINALYSIS, ROUTINE W REFLEX MICROSCOPIC - Abnormal; Notable for the following components:   Color, Urine AMBER (*)    APPearance HAZY (*)    Protein, ur 30 (*)    All other components within normal limits    EKG: None  Radiology: CT Head Wo Contrast Result Date: 09/27/2024 CLINICAL DATA:  Anxiety with worsening visual hallucinations. EXAM: CT HEAD WITHOUT  CONTRAST TECHNIQUE: Contiguous axial images were obtained from the base of the skull through the vertex without intravenous contrast. RADIATION DOSE REDUCTION: This exam was performed according to the departmental dose-optimization program which includes automated exposure control, adjustment of the mA and/or kV according to patient size and/or use of iterative reconstruction technique. COMPARISON:  August 02, 2024 FINDINGS: Brain: There is generalized cerebral atrophy with widening of the extra-axial spaces and ventricular dilatation. There are areas of decreased attenuation within the white matter tracts of the supratentorial brain, consistent with microvascular disease changes. Vascular: Moderate severity bilateral cavernous carotid artery calcification is noted. Skull: Normal. Negative for fracture or focal lesion. Sinuses/Orbits: No acute finding. Other: None. IMPRESSION: 1. Generalized cerebral atrophy and microvascular disease changes of the supratentorial brain. 2. No acute intracranial abnormality. Electronically Signed   By: Suzen Dials M.D.   On: 09/27/2024 15:43     Procedures   Medications Ordered in the ED - No data to display                                  Medical Decision Making Amount and/or Complexity of Data Reviewed Labs: ordered. Radiology: ordered.  Risk Prescription drug management.   Medical Decision Making:   [name] is a 78 y.o. male who presented to the ED today with increased hallucinations detailed above.     Complete initial physical exam performed, notably the patient  was alert and oriented no apparent distress.  Physical neurologic exam at this time is unremarkable..    Reviewed and confirmed nursing documentation for past medical history, family history, social history.    Initial Assessment:   With the patient's presentation of increased hallucinations, most likely secondary to Parkinson's disease.  Further consider possible UTI, possible  acute electrolyte derangement, acute neurovascular insult such as intracranial bleed and/or mass.   Initial Plan:  CT imaging of the head without contrast to evaluate for intracranial bleed or mass Screening labs including CBC and Metabolic panel to evaluate for infectious or metabolic etiology of disease.  Urinalysis with reflex culture ordered to evaluate for UTI or relevant urologic/nephrologic pathology.  EKG to evaluate for cardiac pathology Objective evaluation as below reviewed   Initial Study Results:   Laboratory  All laboratory results reviewed without evidence of clinically relevant pathology.   Exceptions include: Creatinine is elevated however this is a chronic finding in this patient.  EKG EKG was reviewed independently. Rate, rhythm, axis, intervals all examined and without medically relevant abnormality. ST segments without concerns for elevations.    Radiology:  All images reviewed independently. Agree with radiology report at this time.   CT Head Wo Contrast Result  Date: 09/27/2024 CLINICAL DATA:  Anxiety with worsening visual hallucinations. EXAM: CT HEAD WITHOUT CONTRAST TECHNIQUE: Contiguous axial images were obtained from the base of the skull through the vertex without intravenous contrast. RADIATION DOSE REDUCTION: This exam was performed according to the departmental dose-optimization program which includes automated exposure control, adjustment of the mA and/or kV according to patient size and/or use of iterative reconstruction technique. COMPARISON:  August 02, 2024 FINDINGS: Brain: There is generalized cerebral atrophy with widening of the extra-axial spaces and ventricular dilatation. There are areas of decreased attenuation within the white matter tracts of the supratentorial brain, consistent with microvascular disease changes. Vascular: Moderate severity bilateral cavernous carotid artery calcification is noted. Skull: Normal. Negative for fracture or focal  lesion. Sinuses/Orbits: No acute finding. Other: None. IMPRESSION: 1. Generalized cerebral atrophy and microvascular disease changes of the supratentorial brain. 2. No acute intracranial abnormality. Electronically Signed   By: Suzen Dials M.D.   On: 09/27/2024 15:43   DG Chest 2 View Result Date: 09/21/2024 EXAM: 2 VIEW(S) XRAY OF THE CHEST 09/15/2024 11:16:06 AM COMPARISON: 10/16/2023 CLINICAL HISTORY: hallucinations FINDINGS: LUNGS AND PLEURA: Linear left basilar opacities again noted likely reflecting scarring. No pleural effusion. No pneumothorax. HEART AND MEDIASTINUM: Aortic calcification. No acute abnormality of the cardiac and mediastinal silhouettes. BONES AND SOFT TISSUES: Right upper quadrant surgical clips noted. Degenerative changes in the visualized spine greatest in the mid and lower thoracic spine. IMPRESSION: 1. No acute cardiopulmonary abnormality. Electronically signed by: Donnice Mania MD 09/21/2024 05:23 PM EST RP Workstation: [id number]      Consults: Case discussed with  neurology.   Reassessment and Plan:   Discussed case with Chelsea who is the CMA for Dr. Evonnie at [facility] neurology.  Based on the fairly reassuring workup today that there is no acute changes in this patient's condition they have been considering initiating Seroquel  secondary to this patient's hallucinations and psychosis related to his chronic Parkinson's disease.  In discussion with them we will begin prescription of Seroquel  and have him follow-up with neurology at the soonest available appointment.  This care plan was discussed thoroughly with the patient and his wife who verbalized understanding and agreement with the care plan.  They have no further concerns at this time and as such we will discharge with outpatient follow-up as previously discussed.       Final diagnoses:  Hallucinations    ED Discharge Orders          Ordered    QUEtiapine  (SEROQUEL ) 25 MG tablet  Daily at bedtime         09/27/24 1647               Myriam Dorn BROCKS, [place] 09/27/24 1651    Gennaro Bouchard L, DO 10/03/24 1538  "

## 2024-09-27 NOTE — Telephone Encounter (Signed)
 [facility]  PH:   Lisa lvm stating that the pt is being seen by PA(John/Dawn) needs a call back asap. He is in [facility]

## 2024-09-27 NOTE — ED Notes (Signed)
 Wife and pt ambulatory asking about plan of care and if the pt will receive a prescription.   Provider messaged.

## 2024-09-27 NOTE — ED Notes (Signed)
 Wife at desk asking for update and plan.

## 2024-09-27 NOTE — Telephone Encounter (Signed)
 Called Dorn back and he wanted to let me know that they had done a full work up and can't find a physical reason for the hallucinations and did a CT and Cbc and UA all unremarkable. Patient is to go home and I did let Dorn PA know that we had patient on CX list to get him in office and he did ask if anything may help patient until we can get him in office. I did mention Seroquel  to help patient with agitation as well as hallucinations

## 2024-09-27 NOTE — ED Notes (Signed)
 Pt ambulatory in hallway reporting that he wants to leave. Wife states he's going to walk out of here, and I can't stop him. Pt was returned to bed and wife was informed that the provided has been contacted.   Wife repeatedly ambulatory to nurses station asking questions.

## 2024-09-27 NOTE — Discharge Instructions (Signed)
 Seroquel  prescription has been sent in for you, please follow-up with neurology office as they have you on their cancellation list and we will get you in for the next available appointment.

## 2024-09-27 NOTE — ED Triage Notes (Signed)
 Pt presents with wife who reports that over the last month pt has been having increasing visual hallucinations causing extreme anxiety. Pt has had frequent medication changes in the last two weeks. Wife reports that pt has left home and wandered to sister's house d/t seeing people in the middle of the night that are not there. Pt denies SI/HI. Disoriented to situation in triage.

## 2024-09-27 NOTE — Telephone Encounter (Signed)
 Called and spoke with patients wife regarding visit scheduled for today. Provider feels it is more appropriate for patient to be seen in the ED due to these episodes. Explained in depth with patients wife, she gave a verbal understanding will take patient to [facility].

## 2024-09-27 NOTE — Telephone Encounter (Signed)
 Unable to make that call, patients wife knows he will need a work up and providers in the ED will alert neuro if needed. She is concerned and tired due to recent episodes with patients altered mental.

## 2024-09-28 ENCOUNTER — Telehealth: Payer: Self-pay | Admitting: Neurology

## 2024-09-28 NOTE — Telephone Encounter (Signed)
 Pt sister called and LM with AN. No name for sister or phone number left. She said she her brother has been in hospital all day yesterday and she wants to talk with Dr.Tat

## 2024-09-30 NOTE — Telephone Encounter (Signed)
 Called patients wife patient  is on Seroquel  from the ED it appears he is doing better at this time he is seeing hallucinations but no afraid of them and has not left the house from

## 2024-10-03 ENCOUNTER — Other Ambulatory Visit: Payer: Self-pay | Admitting: Internal Medicine

## 2024-10-07 ENCOUNTER — Ambulatory Visit: Payer: Self-pay

## 2024-10-07 NOTE — Telephone Encounter (Signed)
 FYI Only or Action Required?: Action required by provider: request for appointment, clinical question for provider, update on patient condition, and advised ED.  Patient was last seen in primary care on 09/15/2024 by Merna Huxley, NP.  Called Nurse Triage reporting Hallucinations.  Symptoms began yesterday.  Interventions attempted: Prescription medications: seroquil.  Symptoms are: stable.  Triage Disposition: See Physician Within 24 Hours  Patient/caregiver understands and will follow disposition?: Yes    Copied from CRM [id number]. Topic: Clinical - Red Word Triage >> Oct 07, 2024  4:04 PM Wess RAMAN wrote: Red Word that prompted transfer to Nurse Triage: Patient wife, [name], states patient is a nervous wreck. She believes he will explode at any minute. He is having episodes where he sees people and scared of them, causing him to leave home. He also needs something to calm him down. Need something until they see Asberry RAMAN Tat, DO on 10/20/24  Pharmacy: [id number] [address], [place] - [address] [place] [address] Phone: [phone] Fax: [phone] Hours: Not open 24 hours   Reason for Disposition  [1] Hallucinations AND [2] getting WORSE (e.g., sleeping poorly, less able to do activities of daily living)  Answer Assessment - Initial Assessment Questions Pt's wife, [name], contacted clinic to f/u on hallucinations. States that pt did go to ED 12/23 and prescribed Seroquel  25mg  HS. Pt had not had any hallucinations since starting medications until last night. She reports pt has visual hallucinations of people in his home; denies threat or violence. Pt gets upset because people won't get out. She states last night pt went out to the garage without her knowing. Discussed that she has f/u with Dr. Evonnie and appt sch'd 10/20/24 but that she would like to discuss with Dr. Geofm. Reassured her I would send message to provider  but that ultimately if she fears for pt's safety he should return to ED. She voiced frustration as she states she had to fight and plead for seroquil to be prescribed as they told her that pt would be d/c with instructions to f/u with PCP and originally were not going to prescribe any medications. Reassured her that I would update PCP. Per chart, Dr Collie office has been in contact with clinic to get pt scheduled Monday, 10/10/24. Pt currently lucid, talking in the background with wife, in safe environment.    Per chart Dr. Evonnie has been in correspondence with office,   Tat, Asberry RAMAN, DO Neurology Advice Only Reason for conversation       Mercer Mitzie BROCKS, [facility] 10/07/24  4:03 PM Note Spoke with patients wife patient is much worse and is staying up all night Seroquel  does not seem to be working at one time daily. I reached out to PCP office to get patient scheduled they are calling to get him in on Monday.     1. MAIN CONCERN: What happened that made you call today?     Hallucinations and wondering away from home   2. DESCRIPTION: What are the hallucinations like? Describe them for me. (e.g., auditory, visual, tactile; worsening; threatening) If auditory, ask Are they telling you to hurt yourself or someone else?     Visual; pt sees people in his home. Denies that they say anything but he cannot get people to go away   3. ONSET: When did this start? (e.g., hours, days, months)     First occurrence since starting seroquel  12/23  4.  PATTERN: Do they come and go, or are they present all the time?  Are they present now?     Comes and goes   5. PRIOR HALLUCINATIONS: Have you had hallucinations in the past? (e.g., same, different, worse)     Yes; same   6. MENTAL HEALTH HISTORY: Have you ever been diagnosed with schizophrenia or any other mental health problem? (e.g., bipolar disorder, schizophrenia; dementia, Parkinsons)     No   7. MENTAL HEALTH MEDICINES: Are you taking  any medicines for depression or other mental health problems? (e.g., psychiatric medicines; sleeping pills)     Seroquel  25mg  HS   8. ALCOHOL or DRUG ABUSE: Have you been drinking alcohol or taking any drugs? Have you recently stopped drinking alcohol or using drugs?     No   9. MEDICINE CHANGES: Did you recently stop taking a medicine? (e.g., antidepressant, barbiturates, benzodiazepine, gabapentin)  Did you recently start a new medicine or increase the dose? (e.g., antidepressant, digoxin, sleep medicine, steroid, Parkinson's medicine)     No   10. SUPPORT: Is there anyone else with you?       Yes; pt's wife [name]   11. STRESS: Are you experiencing any particular stressors?       No   12. OTHER SYMPTOMS: Are there any other symptoms? (e.g., difficulty breathing, headache, fever, weakness)       No  Protocols used: Hallucinations-A-AH

## 2024-10-07 NOTE — Telephone Encounter (Signed)
" °  FYI Only or Action Required?: Action required by provider: request for appointment and clinical question for provider.  [name] was last seen in primary care on 09/15/2024 by Merna Huxley, NP.  Called Nurse Triage reporting Hallucinations.  Triage Disposition: Call PCP Within 24 Hours  [name]/caregiver understands and will follow disposition?: No, wishes to speak with PCP   Copied from CRM #[id number]. Topic: Clinical - Red Word Triage >> Oct 07, 2024  3:57 PM [name] wrote: Red Word that prompted transfer to Nurse Triage: [name] w/ [facility] called in due to hallucinations getting worse/severe. Would like to know if Dr. Geofm can see [name]. Reason for Disposition  [1] Follow-up call from [name] regarding [name]'s clinical status AND [2] information NON-URGENT  Answer Assessment - Initial Assessment Questions 1. REASON FOR CALL or QUESTION: What is your reason for calling today? or How can I best      [name] states [name] was seen in ED and prescribed Seroquel  at night only for hallucinations. [name] states since [name] has not been seen by neuro recently, she is unable to change prescription and was hoping pt can come into clinic for an appt with PCP to alter regimen for pt.  2. CALLER: Document the source of call. (e.g., laboratory staff, caregiver or [name]).     [name] with [facility]   Attempted to call pt x1. VM left for pt. Routing to clinic to assist with appt scheduling.  Protocols used: PCP Call - No Triage-A-AH  "

## 2024-10-07 NOTE — Telephone Encounter (Signed)
 Pt wife called to speak with chelsea. Said she really needs a call today. [name] has reversed back to his scared self. He is nervous, wrapped tight, trying to leave the house. She needs another medication to help him

## 2024-10-07 NOTE — Telephone Encounter (Signed)
 Spoke with patients wife patient is much worse and is staying up all night Seroquel  does not seem to be working at one time daily. I reached out to PCP office to get patient scheduled they are calling to get him in on Monday

## 2024-10-07 NOTE — Telephone Encounter (Signed)
 Pt wife is calling to speak with chelsea. She said she really needs some medication for [name]

## 2024-10-10 NOTE — Telephone Encounter (Signed)
 Spoke with spouse and made appointment for this coming Wednesday.

## 2024-10-11 NOTE — Progress Notes (Unsigned)
 "   Subjective:    Patient ID: [name], male    DOB: 12-29-1945, 79 y.o.   MRN: [id number]      HPI Keean is here for No chief complaint on file.   09/27/24:  ED for hallucinations.    CT head without acute change.  CMP, CBC, UA without acute findings..  Hallucinations thought to be related to Parkinson's disease.  Case discussed with Dr. Merita office.  Started on Seroquel  25 mg nightly.   rivastigmine for Parkinson's disease dementia is 3-12 mg per day (1.5-6 mg twice daily), with an initial dose of 1.5 mg twice daily and gradual titration every 4 weeks if tolerated.   Medications and allergies reviewed with patient and updated if appropriate.  Medications Ordered Prior to Encounter[1]  Review of Systems     Objective:  There were no vitals filed for this visit. BP Readings from Last 3 Encounters:  09/27/24 (!) 189/116  09/15/24 120/80  08/18/24 133/83   Wt Readings from Last 3 Encounters:  09/15/24 195 lb (88.5 kg)  08/18/24 192 lb (87.1 kg)  07/29/24 194 lb 12.8 oz (88.4 kg)   There is no height or weight on file to calculate BMI.    Physical Exam         Assessment & Plan:    See Problem List for Assessment and Plan of chronic medical problems.         [1]  Current Outpatient Medications on File Prior to Visit  Medication Sig Dispense Refill   allopurinol  (ZYLOPRIM ) 100 MG tablet TAKE 1 TABLET BY MOUTH EVERY DAY 90 tablet 3   apixaban  (ELIQUIS ) 2.5 MG TABS tablet Take 1 tablet (2.5 mg total) by mouth 2 (two) times daily. 180 tablet 1   calcium  carbonate (TUMS - DOSED IN MG ELEMENTAL CALCIUM ) 500 MG chewable tablet Chew 2 tablets by mouth daily as needed for indigestion or heartburn.      Carbidopa -Levodopa  ER (SINEMET  CR) 25-100 MG tablet controlled release Take 1 pill three times a day at 8 AM / 12 PM and 4 PM 270 tablet 0   clopidogrel  (PLAVIX ) 75 MG tablet TAKE 1 TABLET BY MOUTH EVERY DAY WITH BREAKFAST 90 tablet 3   Cyanocobalamin   (VITAMIN B12 PO) Take 1 tablet by mouth every evening.     diltiazem  (CARDIZEM  CD) 240 MG 24 hr capsule Take 1 capsule (240 mg total) by mouth daily. 90 capsule 3   ezetimibe  (ZETIA ) 10 MG tablet TAKE 1 TABLET BY MOUTH EVERY DAY 90 tablet 3   famotidine  (PEPCID ) 40 MG tablet TAKE 1 TABLET BY MOUTH EVERY DAY 30 tablet 3   fish oil-omega-3 fatty acids 1000 MG capsule Take 2 g by mouth 2 (two) times daily.       metoprolol  tartrate (LOPRESSOR ) 50 MG tablet Take 1 tablet (50 mg total) by mouth 2 (two) times daily. 180 tablet 3   Misc Natural Products (TART CHERRY ADVANCED PO) Take 1,000 mg by mouth daily.     nitroGLYCERIN  (NITROSTAT ) 0.4 MG SL tablet Place 1 tablet (0.4 mg total) under the tongue every 5 (five) minutes x 3 doses as needed for chest pain. 25 tablet 4   Potassium Gluconate 2 MEQ TABS Take 1 tablet by mouth daily.     QUEtiapine  (SEROQUEL ) 25 MG tablet Take 1 tablet (25 mg total) by mouth at bedtime. 30 tablet 0   rosuvastatin  (CRESTOR ) 20 MG tablet TAKE 1 TABLET BY MOUTH EVERY DAY 90 tablet 3  No current facility-administered medications on file prior to visit.   "

## 2024-10-12 ENCOUNTER — Encounter: Payer: Self-pay | Admitting: Internal Medicine

## 2024-10-12 ENCOUNTER — Ambulatory Visit: Admitting: Internal Medicine

## 2024-10-12 ENCOUNTER — Telehealth: Payer: Self-pay

## 2024-10-12 VITALS — BP 130/78 | HR 58 | Temp 98.3°F | Ht 76.0 in | Wt 193.0 lb

## 2024-10-12 DIAGNOSIS — G20A1 Parkinson's disease without dyskinesia, without mention of fluctuations: Secondary | ICD-10-CM

## 2024-10-12 DIAGNOSIS — R441 Visual hallucinations: Secondary | ICD-10-CM

## 2024-10-12 DIAGNOSIS — R410 Disorientation, unspecified: Secondary | ICD-10-CM | POA: Diagnosis not present

## 2024-10-12 NOTE — Assessment & Plan Note (Signed)
 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 Restart in Sinemet  Will increase seroquel  ? Consider rivastigmine

## 2024-10-12 NOTE — Telephone Encounter (Signed)
 Called pateint to offer sooner appointment for today. Patient declined and said he wanted to wait for his already scheduled appointment on the 16 th

## 2024-10-12 NOTE — Patient Instructions (Addendum)
" ° ° ° ° °  Medications changes include :   increase seroquel   - try 12.5 mg in morning and continue 25 mg in evening    I will get in touch with Dr Tat about starting a different medication     "

## 2024-10-12 NOTE — Assessment & Plan Note (Signed)
 Chronic Following with Dr tat Currently not on Sinemet  Having memory difficulties - could not tell me his wife's name today and sometimes does not know who she is Having hallucinations Hiding things from people and not able to find things which is causing anxiety and frustration and causes him to leave the house to escape the hallucinations Seroquel  25 mg at night has helped some - no longer scared of the hallucinations --- still having all the symptoms.   Sees Dr Tat next week - ? Restart Sinemet  Continue seroquel  25 mg in the evening, start 12.5 mg of seroquel  in the morning ? Add rivastigmine 1.5 mg twice daily  ? Add low dose SSRI

## 2024-10-12 NOTE — Assessment & Plan Note (Signed)
 Chronic This has been going on for some time - would expect to be related to Parkinson's -- has had tremor for years and likely has had Parkinsons for longer than we realize ? Need neuropsych eval ? Consider starting rivastigmine to help with memory and hallucinations

## 2024-10-14 ENCOUNTER — Ambulatory Visit

## 2024-10-14 DIAGNOSIS — Z0000 Encounter for general adult medical examination without abnormal findings: Secondary | ICD-10-CM

## 2024-10-14 NOTE — Progress Notes (Signed)
 "  Chief Complaint  Patient presents with   Medicare Wellness     Subjective:   [name] is a 79 y.o. male who presents for a Medicare Annual Wellness Visit.  Visit info / Clinical Intake: Medicare Wellness Visit Type:: Subsequent Annual Wellness Visit Persons participating in visit and providing information:: patient Medicare Wellness Visit Mode:: Telephone If telephone:: video declined Since this visit was completed virtually, some vitals may be partially provided or unavailable. Missing vitals are due to the limitations of the virtual format.: Unable to obtain vitals - no equipment If Telephone or Video please confirm:: I connected with patient using audio/video enable telemedicine. I verified patient identity with two identifiers, discussed telehealth limitations, and patient agreed to proceed. Patient Location:: [facility] Provider Location:: Home Interpreter Needed?: No Pre-visit prep was completed: yes AWV questionnaire completed by patient prior to visit?: no Living arrangements:: lives with spouse/significant other Patient's Overall Health Status Rating: (!) fair Typical amount of pain: none Does pain affect daily life?: no Are you currently prescribed opioids?: no  Dietary Habits and Nutritional Risks How many meals a day?: 2 Eats fruit and vegetables daily?: yes Most meals are obtained by: having others provide food In the last 2 weeks, have you had any of the following?: none Diabetic:: no  Functional Status Activities of Daily Living (to include ambulation/medication): Independent Ambulation: Independent Medication Administration: Independent Home Management (perform basic housework or laundry): Independent Manage your own finances?: yes Primary transportation is: driving Concerns about vision?: (!) yes Concerns about hearing?: (!) yes Uses hearing aids?: no  Fall Screening Falls in the past year?: 0 Number of falls in past year: 0 Was there an injury with  Fall?: 0 Fall Risk Category Calculator: 0 Patient Fall Risk Level: Low Fall Risk  Fall Risk Patient at Risk for Falls Due to: No Fall Risks Fall risk Follow up: Falls evaluation completed  Home and Transportation Safety: All rugs have non-skid backing?: yes All stairs or steps have railings?: (!) no (2 steps against a wall) Grab bars in the bathtub or shower?: yes Have non-skid surface in bathtub or shower?: yes Good home lighting?: yes Regular seat belt use?: yes Hospital stays in the last year:: no  Cognitive Assessment Difficulty concentrating, remembering, or making decisions? : yes Will 6CIT or Mini Cog be Completed: yes What year is it?: 4 points What month is it?: 0 points Give patient an address phrase to remember (5 components): unable to hear me About what time is it?: 0 points Count backwards from 20 to 1: 2 points Say the months of the year in reverse: 4 points  Advance Directives (For Healthcare) Does Patient Have a Medical Advance Directive?: No Type of Advance Directive: Living will Would patient like information on creating a medical advance directive?: No - Patient declined  Reviewed/Updated  Reviewed/Updated: Reviewed All (Medical, Surgical, Family, Medications, Allergies, Care Teams, Patient Goals)    Allergies (verified) Nitroglycerin  and Lidocaine    Current Medications (verified) Outpatient Encounter Medications as of 10/14/2024  Medication Sig   allopurinol  (ZYLOPRIM ) 100 MG tablet TAKE 1 TABLET BY MOUTH EVERY DAY   apixaban  (ELIQUIS ) 2.5 MG TABS tablet Take 1 tablet (2.5 mg total) by mouth 2 (two) times daily.   calcium  carbonate (TUMS - DOSED IN MG ELEMENTAL CALCIUM ) 500 MG chewable tablet Chew 2 tablets by mouth daily as needed for indigestion or heartburn.    clopidogrel  (PLAVIX ) 75 MG tablet TAKE 1 TABLET BY MOUTH EVERY DAY WITH BREAKFAST  Cyanocobalamin  (VITAMIN B12 PO) Take 1 tablet by mouth every evening.   diltiazem  (CARDIZEM  CD) 240 MG 24  hr capsule Take 1 capsule (240 mg total) by mouth daily.   ezetimibe  (ZETIA ) 10 MG tablet TAKE 1 TABLET BY MOUTH EVERY DAY   famotidine  (PEPCID ) 40 MG tablet TAKE 1 TABLET BY MOUTH EVERY DAY   fish oil-omega-3 fatty acids 1000 MG capsule Take 2 g by mouth 2 (two) times daily.     metoprolol  tartrate (LOPRESSOR ) 50 MG tablet Take 1 tablet (50 mg total) by mouth 2 (two) times daily.   Misc Natural Products (TART CHERRY ADVANCED PO) Take 1,000 mg by mouth daily.   nitroGLYCERIN  (NITROSTAT ) 0.4 MG SL tablet Place 1 tablet (0.4 mg total) under the tongue every 5 (five) minutes x 3 doses as needed for chest pain.   Potassium Gluconate 2 MEQ TABS Take 1 tablet by mouth daily.   QUEtiapine  (SEROQUEL ) 25 MG tablet Take 1 tablet (25 mg total) by mouth at bedtime.   rosuvastatin  (CRESTOR ) 20 MG tablet TAKE 1 TABLET BY MOUTH EVERY DAY   Carbidopa -Levodopa  ER (SINEMET  CR) 25-100 MG tablet controlled release Take 1 pill three times a day at 8 AM / 12 PM and 4 PM (Patient not taking: Reported on 10/12/2024)   No facility-administered encounter medications on file as of 10/14/2024.    History: Past Medical History:  Diagnosis Date   Anemia, mild    CAD (coronary artery disease)    a. 02/2010 NSTEMI/PCI: PROMUS DES to mRCA. b. 06/05/14 Cath/PCI: pLAD 70%--> s/p PCI/DES (Promus DES);  c. 01/2015 Cath/PCI: LM nl, LAD 47m, patent prox stent, LCX patent prox stent, RCA 30p, 48m (3.5x20 Synergy DES). // Myoview  07/2019:  EF 48, atten artifact, no ischemia, intermediate risk (low EF)    CKD (chronic kidney disease), stage III (HCC)    Diastolic dysfunction    a. Echo 6/11: mild LVH, EF 55-60%, GR1DD, Trivial MR, mild LAE. b. echo 06/04/14: EF 55-60%, no WMA, GR1DD, Ao valve mildly calcifed, mild MR, mild LAE   History of pulmonary embolism    Hyperlipidemia    Hypertension    did not tolerate Lisinopril event at low dose   Melanoma (HCC)    a. 1987. b. 2012   Myocardial infarction (HCC)    Persistent atrial  fibrillation (HCC) 12/03/2018   Dx 11/2018 >> Apixaban  started // Echo 12/2018: EF 55-60, mod RVE, normal RVSF, mild LAE, trivial MR, mild TR, mod AoV sclerosis    Prostate cancer (HCC)    Dr Ottelin   Stroke [facility])    TIA (transient ischemic attack)    a. post cath and PCI on 06/05/2014 - no residual sequelae. // ?recurrent TIA 8/21 >> Carotid US  8/21: normal  // Echocardiogram 8/21: EF 55-60, no RWMA, normal RVSF, mild BAE, trivial MR, trivial AI    Vitamin B12 deficiency    Past Surgical History:  Procedure Laterality Date   CHOLECYSTECTOMY     COLONOSCOPY  10/06/2004   CORONARY STENT INTERVENTION N/A 08/05/2019   Procedure: CORONARY STENT INTERVENTION;  Surgeon: Dann Candyce RAMAN, MD;  Location: [facility] INVASIVE CV LAB;  Service: Cardiovascular;  Laterality: N/A;   KNEE ARTHROSCOPY     right   LEFT HEART CATH AND CORONARY ANGIOGRAPHY N/A 08/05/2019   Procedure: LEFT HEART CATH AND CORONARY ANGIOGRAPHY;  Surgeon: Dann Candyce RAMAN, MD;  Location: [facility] INVASIVE CV LAB;  Service: Cardiovascular;  Laterality: N/A;   LEFT HEART CATHETERIZATION WITH CORONARY ANGIOGRAM N/A  06/05/2014   Procedure: LEFT HEART CATHETERIZATION WITH CORONARY ANGIOGRAM;  Surgeon: Ozell JONETTA Fell, MD;  Location: [facility] CATH LAB;  Service: Cardiovascular;  Laterality: N/A;   LEFT HEART CATHETERIZATION WITH CORONARY ANGIOGRAM N/A 01/19/2015   Procedure: LEFT HEART CATHETERIZATION WITH CORONARY ANGIOGRAM;  Surgeon: Peter M Jordan, MD;  Location: [facility] CATH LAB;  Service: Cardiovascular;  Laterality: N/A;   MELANOMA EXCISION  10/06/2009   L chest   MELANOMA EXCISION  10/06/1989   back   mRCA stent  02/03/2010   PROSTATECTOMY     Dr. Ottelin   TONSILLECTOMY     Family History  Problem Relation Age of Onset   Coronary artery disease Mother        S/P CABG, in her 19s   Transient ischemic attack Mother 87   Heart disease Mother    Prostate cancer Father    Diabetes Sister    Hypertension Brother    Parkinson's disease  Maternal Aunt    Healthy Child    Social History   Occupational History   Occupation: Retired    Comment: [employer] company  Tobacco Use   Smoking status: Never   Smokeless tobacco: Never  Vaping Use   Vaping status: Never Used  Substance and Sexual Activity   Alcohol use: No    Alcohol/week: 0.0 standard drinks of alcohol   Drug use: No   Sexual activity: Not on file   Tobacco Counseling Counseling given: Not Answered  SDOH Screenings   Food Insecurity: No Food Insecurity (10/14/2024)  Housing: Unknown (10/14/2024)  Transportation Needs: No Transportation Needs (10/14/2024)  Utilities: Not At Risk (10/14/2024)  Alcohol Screen: Low Risk (10/09/2023)  Depression (PHQ2-9): Low Risk (10/14/2024)  Recent Concern: Depression (PHQ2-9) - Medium Risk (10/12/2024)  Financial Resource Strain: Low Risk (10/09/2023)  Physical Activity: Inactive (10/14/2024)  Social Connections: Moderately Isolated (10/14/2024)  Stress: Stress Concern Present (10/14/2024)  Tobacco Use: Low Risk (10/12/2024)  Health Literacy: Inadequate Health Literacy (10/14/2024)   See flowsheets for full screening details  Depression Screen PHQ 2 & 9 Depression Scale- Over the past 2 weeks, how often have you been bothered by any of the following problems? Little interest or pleasure in doing things: 2 Feeling down, depressed, or hopeless (PHQ Adolescent also includes...irritable): 0 PHQ-2 Total Score: 2 Trouble falling or staying asleep, or sleeping too much: 0 Feeling tired or having little energy: 1 Poor appetite or overeating (PHQ Adolescent also includes...weight loss): 0 Feeling bad about yourself - or that you are a failure or have let yourself or your family down: 0 Trouble concentrating on things, such as reading the newspaper or watching television (PHQ Adolescent also includes...like school work): 0 Moving or speaking so slowly that other people could have noticed. Or the opposite - being so fidgety or restless that you have  been moving around a lot more than usual: 0 Thoughts that you would be better off dead, or of hurting yourself in some way: 0 PHQ-9 Total Score: 3 If you checked off any problems, how difficult have these problems made it for you to do your work, take care of things at home, or get along with other people?: Not difficult at all  Depression Treatment Depression Interventions/Treatment : Currently on Treatment     Goals Addressed             This Visit's Progress    feeling better.               Objective:  There were no vitals filed for this visit. There is no height or weight on file to calculate BMI.  Hearing/Vision screen No results found. Immunizations and Health Maintenance Health Maintenance  Topic Date Due   Zoster Vaccines- Shingrix (1 of 2) Never done   COVID-19 Vaccine (3 - Pfizer risk series) 01/31/2020   Influenza Vaccine  Never done   Medicare Annual Wellness (AWV)  10/08/2024   Pneumococcal Vaccine: 50+ Years (1 of 2 - PCV) 11/05/2024 (Originally 04/27/1965)   Colonoscopy  10/14/2025 (Originally 10/27/2022)   DTaP/Tdap/Td (3 - Td or Tdap) 02/17/2033   Meningococcal B Vaccine  Aged Out   Hepatitis C Screening  Discontinued        Assessment/Plan:  This is a routine wellness examination for [name].  Patient Care Team: Geofm Glade PARAS, MD as PCP - General (Internal Medicine) Floretta Mallard, MD as PCP - Cardiology (Cardiology) Camillo Golas, MD as Attending Physician (Ophthalmology)  I have personally reviewed and noted the following in the patients chart:   Medical and social history Use of alcohol, tobacco or illicit drugs  Current medications and supplements including opioid prescriptions. Functional ability and status Nutritional status Physical activity Advanced directives List of other physicians Hospitalizations, surgeries, and ER visits in previous 12 months Vitals Screenings to include cognitive, depression, and falls Referrals and  appointments  No orders of the defined types were placed in this encounter.  In addition, I have reviewed and discussed with patient certain preventive protocols, quality metrics, and best practice recommendations. A written personalized care plan for preventive services as well as general preventive health recommendations were provided to patient.   Arnette LOISE Hoots, CMA   10/14/2024   No follow-ups on file.  After Visit Summary: (Declined) Due to this being a telephonic visit, with patients personalized plan was offered to patient but patient Declined AVS at this time   Nurse Notes: Patient was having a hard time hearing and didn't answer a couple due to not being able to hear the question repeated to him. He has declined a colonoscopy and the flu shot. "

## 2024-10-14 NOTE — Patient Instructions (Signed)
 [name],  Thank you for taking the time for your Medicare Wellness Visit. I appreciate your continued commitment to your health goals. Please review the care plan we discussed, and feel free to reach out if I can assist you further.  Please note that Annual Wellness Visits do not include a physical exam. Some assessments may be limited, especially if the visit was conducted virtually. If needed, we may recommend an in-person follow-up with your provider.  Ongoing Care Seeing your primary care provider every 3 to 6 months helps us  monitor your health and provide consistent, personalized care. 11/10/24 @1020   Referrals If a referral was made during today's visit and you haven't received any updates within two weeks, please contact the referred provider directly to check on the status.  Recommended Screenings:  Health Maintenance  Topic Date Due   Zoster (Shingles) Vaccine (1 of 2) Never done   COVID-19 Vaccine (3 - Pfizer risk series) 01/31/2020   Colon Cancer Screening  10/27/2022   Flu Shot  Never done   Medicare Annual Wellness Visit  10/08/2024   Pneumococcal Vaccine for age over 36 (1 of 2 - PCV) 11/05/2024*   DTaP/Tdap/Td vaccine (3 - Td or Tdap) 02/17/2033   Meningitis B Vaccine  Aged Out   Hepatitis C Screening  Discontinued  *Topic was postponed. The date shown is not the original due date.       10/14/2024    1:52 PM  Advanced Directives  Does Patient Have a Medical Advance Directive? No  Would patient like information on creating a medical advance directive? No - Patient declined    Vision: Annual vision screenings are recommended for early detection of glaucoma, cataracts, and diabetic retinopathy. These exams can also reveal signs of chronic conditions such as diabetes and high blood pressure.  Dental: Annual dental screenings help detect early signs of oral cancer, gum disease, and other conditions linked to overall health, including heart disease and  diabetes.  Please see the attached documents for additional preventive care recommendations.

## 2024-10-17 ENCOUNTER — Encounter: Payer: Self-pay | Admitting: Internal Medicine

## 2024-10-17 ENCOUNTER — Telehealth: Payer: Self-pay | Admitting: Neurology

## 2024-10-17 ENCOUNTER — Other Ambulatory Visit: Payer: Self-pay | Admitting: Internal Medicine

## 2024-10-17 MED ORDER — QUETIAPINE FUMARATE 25 MG PO TABS: ORAL_TABLET | ORAL | 3 refills | Status: DC

## 2024-10-17 NOTE — Telephone Encounter (Signed)
 Pt's wife calling about Rx QUEtiapine  (SEROQUEL ) 25 MG tablet  prescribed from Emergency room would like refill, says Dr. Evonnie is aware. Not enough to last til Thursday- [facility] [address] [place].the patient's wife would like contact back. Pt's wife is asking fro Pt to be waitlisted, also Pt has got much worse memory, hallucinations, anxiety and aggression

## 2024-10-17 NOTE — Telephone Encounter (Signed)
 Called patient and asked if Dr. Geofm could refill until we see patient on Thursday . Patients wife understood. I did let patients wife know we called last Thursday to get them a sooner appointment and patient refused appointment

## 2024-10-17 NOTE — Telephone Encounter (Deleted)
 Copied from CRM #[id number]. Topic: Clinical - Medication Refill >> Oct 17, 2024 10:14 AM Suzen RAMAN wrote: Medication: QUEtiapine  (SEROQUEL ) 25 MG tablet  Has the patient contacted their pharmacy? Yes   This is the patient's preferred pharmacy:  [id number] [place], [place] - [address] [place] [address] Phone: [phone] Fax: [phone]  Is this the correct pharmacy for this prescription? Yes If no, delete pharmacy and type the correct one.   Has the prescription been filled recently? Yes; 09/27/24-per patient wife patient doesn't have enough medication to last until Thursday  Is the patient out of the medication? Yes  Has the patient been seen for an appointment in the last year OR does the patient have an upcoming appointment? Yes  Can we respond through MyChart? Yes  Agent: Please be advised that Rx refills may take up to 3 business days. We ask that you follow-up with your pharmacy. >> Oct 17, 2024  2:38 PM Macario HERO wrote: Patient spouse called said it's been 5 hours and they have yet to receive the medication. Requesting we put a [place] on it because he is out and provider promised she would send it over.

## 2024-10-17 NOTE — Telephone Encounter (Signed)
 Copied from CRM #[id number]. Topic: Clinical - Medication Refill >> Oct 17, 2024 10:14 AM Suzen RAMAN wrote: Medication: QUEtiapine  (SEROQUEL ) 25 MG tablet  Has the patient contacted their pharmacy? Yes   This is the patient's preferred pharmacy:  [id number] [place], [place] - [address] [place] [address] Phone: [phone] Fax: [phone]  Is this the correct pharmacy for this prescription? Yes If no, delete pharmacy and type the correct one.   Has the prescription been filled recently? Yes; 09/27/24-per patient wife patient doesn't have enough medication to last until Thursday  Is the patient out of the medication? Yes  Has the patient been seen for an appointment in the last year OR does the patient have an upcoming appointment? Yes  Can we respond through MyChart? Yes  Agent: Please be advised that Rx refills may take up to 3 business days. We ask that you follow-up with your pharmacy.

## 2024-10-17 NOTE — Telephone Encounter (Signed)
 Copied from CRM #[id number]. Topic: Clinical - Medication Refill >> Oct 17, 2024  2:38 PM Macario HERO wrote: Patient spouse called said it's been 5 hours and they have yet to receive the medication. Requesting we put a [place] on it because he is out and provider promised she would send it over.

## 2024-10-17 NOTE — Telephone Encounter (Signed)
 This encounter was created in error - please disregard.

## 2024-10-18 NOTE — Patient Instructions (Incomplete)
 1.  Increase quetiapine , 25 mg twice per day. 2.  Start Depakote  125 mg twice per day 3.  I need you to call your insurance company and find out where you can see a geriatric psychiatrist that takes your insurance in [place] or the surrounding areas.  Currently, the [facility] group is not accepting new patients and Dr. Tasia is not either, and those are the only 2 groups that I am aware of. 4.  Please reach out to [facility] [place] and find out what resources are accessible to you   VISIT SUMMARY: During your neurology follow-up, we discussed the worsening of your neuropsychiatric and motor symptoms related to Parkinson's disease. Your symptoms include visual hallucinations, paranoia, wandering, mood changes, aggression, and disrupted sleep. We reviewed your current medications and made adjustments to better manage your symptoms.  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 We will continue your current dose of quetiapine  and have added valproic acid to help manage your mood and behavior. We have deferred restarting carbidopa -levodopa  for now and will reassess in 6-8 weeks. You have been referred to geriatric psychiatry for further behavioral management and to the [facility] for additional support. We also recommend contacting [facility] for respite care and provided information on [facility] for supervision and socialization. It is important to remove vehicle keys from your home for safety reasons. We discussed coordinating with your pharmacy to ensure your medications are clearly labeled and easy to open.  INSTRUCTIONS: Please follow up with a video visit in 6-8 weeks to reassess your symptoms. Additionally, contact your insurance provider  for geriatric psychiatry options and reach out to [facility] and [facility] for additional support.                      Contains text generated by Abridge.                                 Contains text generated by Abridge.

## 2024-10-18 NOTE — Progress Notes (Unsigned)
 "   Assessment/Plan:   1.  Parkinsons disease, diagnosed February, 2025  -off of carbidopa /levodopa  25/100, 1 tablet 3 times per day right now.  Stay off of it and we will reevaluate in 6 to 8 weeks at follow-up.  Starting other medications and increasing others today.           2.  A-fib/history of PE             -on eliquis              -pt also on plavix .  Pt states unknown why on both of the above.  I reviewed recent cardiology notes, but did not actually make mention of either of the medications.  Patients cardiologist does prescribe both, however.  I am quite sure there is a good reason for both of the medications.  He and I did discuss increased risk of falls with Parkinson's, but his balance was actually quite good today.   3.  Hx melanoma             -follows with dermatology at [facility]             -He and I discussed that Parkinsons increases risk for melanoma.  4. MGUS  Following with-hematology/oncology  5.  Confusion, hallucinations, paranoia  - Increase quetiapine , 25 mg in the morning, 50 mg in the evening, additional half tablet as needed for agitation.  Discussed extensively blackbox warning and patient/wife/daughter in law all expressed understanding of what this meant.  - Start Depakote , 125 mg twice per day  - Discussed with patient and wife that she may need to consider alternative living situations  - Discussed with patient and wife that they will need to find geriatric psychiatry to help to manage this, as this is something that is a bit out of my realm.  They will need to call their insurance company to see who is taking patients with their insurance, as that has been a very difficult task in our area  - I asked the patients wife to reach out to [facility] [place] and find out what resources are accessible to her.  -refer to [facility]  - Patient should not be driving.  He should not have access to his keys or his truck and discussed this in  detail    Subjective:   Discussed the use of AI scribe software for clinical note transcription with the patient, who gave verbal consent to proceed.  History of Present Illness [name] is an 79 year old male with Parkinson's disease complicated by dementia and behavioral disturbance presenting for neurology follow-up due to worsening neuropsychiatric and motor symptoms.  Wife and daughter-in-law present and supplement history.  INTERVAL HISTORY: Since early January 2026, he has experienced a marked escalation in neuropsychiatric symptoms, including frequent visual hallucinations, paranoia, wandering, erratic mood changes, and episodes of aggression. These symptoms fluctuate rapidly, with abrupt transitions between periods of calm and agitation. Hallucinations and paranoia have intensified, often involving seeing people and animals in the house, leading to attempts to leave the home to escape perceived threats. He exhibits persistent restlessness, agitation, and wandering, with frequent movement of items around the house. Sleep is significantly disrupted, with frequent nighttime awakenings, sometimes as early as 3-4 AM. He occasionally receives an additional half tablet of quetiapine  for breakthrough agitation. Despite titration of quetiapine , symptoms of hallucinations, paranoia, and agitation continue to be reported. He has difficulty trusting his spouse and caregivers regarding medication administration and has previously  discarded medications. He requires clear labeling and easy-to-open bottles due to arthritis. Keys and vehicles remain accessible at home, and there have been multiple episodes of leaving the house in his truck, sometimes early in the morning. Family members, including his spouse and two sons, are actively involved in his care and are working to address safety concerns.  No fever, infection, or other acute medical complaints reported.  Carbidopa -levodopa  was discontinued  recently to assess its impact on behavior. Since discontinuation, his tremor has worsened significantly, but neuropsychiatric symptoms did not improve and have continued to deteriorate. He is currently not taking carbidopa -levodopa . Sleep remains poor, and he is sometimes awake for extended periods at night.  PRIOR CLINICAL COURSE:    Patient last seen end of October.  At that point in time, patient was having hallucinations.  He had been taking Benadryl and we asked him to go ahead and stop the Benadryl.  We did lab work as well as a CT of the brain.  I asked him to hold his levodopa  over the following weekend.  The labs and CT brain were unremarkable.  Ultimately, he initially did well and the levodopa  was restarted.  Not long thereafter, he continued to have hallucinations, so we changed his immediate release to extended release levodopa .  Patient continued to have hallucinations, mood changes and aggressive behavior.  He was also wandering.  Ultimately, we asked him to stop all levodopa .  He was evaluated in the emergency room December 23 and quetiapine  was initiated.  He is currently on quetiapine  25 mg, 1 tablet in the morning and 1 tablet at bedtime (increased from what was RX at 1/2 in the AM, 1 at night) by his wife as she didn't think lower dose helpful.     Current prescribed movement disorder medications: Carbidopa /levodopa  25/100, 1 tablet 3 times per day.  ALLERGIES:   Allergies  Allergen Reactions   Nitroglycerin  Other (See Comments)    Pt will not take.  States he was in hospital and after taking second NTG SL pt states I had to be shocked, I am scared of that medicine and will not take again.    Lidocaine  Other (See Comments)    Syncope post palmer injection; probably vasovagal as no reaction to intraarticular Lidocaine  into knee    CURRENT MEDICATIONS:  Current Meds  Medication Sig   allopurinol  (ZYLOPRIM ) 100 MG tablet TAKE 1 TABLET BY MOUTH EVERY DAY   apixaban  (ELIQUIS )  2.5 MG TABS tablet Take 1 tablet (2.5 mg total) by mouth 2 (two) times daily.   calcium  carbonate (TUMS - DOSED IN MG ELEMENTAL CALCIUM ) 500 MG chewable tablet Chew 2 tablets by mouth daily as needed for indigestion or heartburn.    Carbidopa -Levodopa  ER (SINEMET  CR) 25-100 MG tablet controlled release Take 1 pill three times a day at 8 AM / 12 PM and 4 PM   clopidogrel  (PLAVIX ) 75 MG tablet TAKE 1 TABLET BY MOUTH EVERY DAY WITH BREAKFAST   Cyanocobalamin  (VITAMIN B12 PO) Take 1 tablet by mouth every evening.   diltiazem  (CARDIZEM  CD) 240 MG 24 hr capsule Take 1 capsule (240 mg total) by mouth daily.   divalproex  (DEPAKOTE ) 125 MG DR tablet Take 1 tablet (125 mg total) by mouth 2 (two) times daily.   ezetimibe  (ZETIA ) 10 MG tablet TAKE 1 TABLET BY MOUTH EVERY DAY   famotidine  (PEPCID ) 40 MG tablet TAKE 1 TABLET BY MOUTH EVERY DAY   fish oil-omega-3 fatty acids 1000 MG capsule Take 2  g by mouth 2 (two) times daily.     metoprolol  tartrate (LOPRESSOR ) 50 MG tablet Take 1 tablet (50 mg total) by mouth 2 (two) times daily.   Misc Natural Products (TART CHERRY ADVANCED PO) Take 1,000 mg by mouth daily.   nitroGLYCERIN  (NITROSTAT ) 0.4 MG SL tablet Place 1 tablet (0.4 mg total) under the tongue every 5 (five) minutes x 3 doses as needed for chest pain.   Potassium Gluconate 2 MEQ TABS Take 1 tablet by mouth daily.   QUEtiapine  (SEROQUEL ) 50 MG tablet Take 1 tablet (50 mg total) by mouth at bedtime.   rosuvastatin  (CRESTOR ) 20 MG tablet TAKE 1 TABLET BY MOUTH EVERY DAY   [DISCONTINUED] QUEtiapine  (SEROQUEL ) 25 MG tablet Take half a tablet in the morning and 1 tablet in the evening     Objective:   PHYSICAL EXAMINATION:    VITALS:   Vitals:   10/20/24 1114  BP: 124/78  Pulse: 74  SpO2: 98%  Weight: 193 lb 3.2 oz (87.6 kg)      GEN:  The patient appears stated age and is in NAD.  He follows commands but is otherwise minimally interactive with conversation HEENT:  Normocephalic, atraumatic.   The mucous membranes are moist. The superficial temporal arteries are without ropiness or tenderness. CV:  RRR Lungs:  CTAB Neck/HEME:  There are no carotid bruits bilaterally.  Neurological examination:  Orientation: The patient is alert and oriented to person and place.   Cranial nerves: There is good facial symmetry with no significant facial hypomimia. The speech is fluent and clear. Soft palate rises symmetrically and there is no tongue deviation. Hearing is intact to conversational tone. Sensation: Sensation is intact to light touch throughout Motor: Strength is at least antigravity x4.  Movement examination: Tone: There is mild increased tone in the RUE Abnormal movements: No tremor today. Coordination:  There is no decremation with RAM's, with any form of RAMS, including alternating supination and pronation of the forearm, hand opening and closing, finger taps, heel taps and toe taps.  Gait and Station: The patient has no difficulty arising out of a deep-seated chair without the use of the hands. The patient's stride length is good.    I have reviewed and interpreted the following labs independently    Chemistry      Component Value Date/Time   NA 137 09/27/2024 1430   NA 143 01/27/2023 0846   K 4.0 09/27/2024 1430   CL 102 09/27/2024 1430   CO2 25 09/27/2024 1430   BUN 16 09/27/2024 1430   BUN 16 01/27/2023 0846   CREATININE 1.56 (H) 09/27/2024 1430   CREATININE 1.74 (H) 08/18/2024 1052   CREATININE 1.62 (H) 07/29/2024 1438      Component Value Date/Time   CALCIUM  9.8 09/27/2024 1430   ALKPHOS 51 09/27/2024 1430   AST 30 09/27/2024 1430   AST 28 08/18/2024 1052   ALT 32 09/27/2024 1430   ALT 28 08/18/2024 1052   BILITOT 0.8 09/27/2024 1430   BILITOT 0.8 08/18/2024 1052       Lab Results  Component Value Date   WBC 5.2 09/27/2024   HGB 14.3 09/27/2024   HCT 42.4 09/27/2024   MCV 85.0 09/27/2024   PLT 113 (L) 09/27/2024    Lab Results  Component Value  Date   TSH 4.75 05/10/2024     Total time spent on today's visit was 70 minutes, including both face-to-face time and nonface-to-face time.  Time included that spent on  review of records (prior notes available to me/labs/imaging if pertinent), discussing treatment and goals, answering patient's questions and coordinating care.  Cc:  Geofm Glade PARAS, MD  "

## 2024-10-20 ENCOUNTER — Ambulatory Visit: Admitting: Neurology

## 2024-10-20 ENCOUNTER — Encounter: Payer: Self-pay | Admitting: Neurology

## 2024-10-20 VITALS — BP 124/78 | HR 74 | Wt 193.2 lb

## 2024-10-20 DIAGNOSIS — F02B2 Dementia in other diseases classified elsewhere, moderate, with psychotic disturbance: Secondary | ICD-10-CM | POA: Diagnosis not present

## 2024-10-20 DIAGNOSIS — G20A1 Parkinson's disease without dyskinesia, without mention of fluctuations: Secondary | ICD-10-CM | POA: Diagnosis not present

## 2024-10-20 MED ORDER — QUETIAPINE FUMARATE 25 MG PO TABS: ORAL_TABLET | ORAL | Status: AC

## 2024-10-20 MED ORDER — QUETIAPINE FUMARATE 50 MG PO TABS: 50.0000 mg | ORAL_TABLET | Freq: Every day | ORAL | 1 refills | Status: AC

## 2024-10-20 MED ORDER — DIVALPROEX SODIUM 125 MG PO DR TAB: 125.0000 mg | DELAYED_RELEASE_TABLET | Freq: Two times a day (BID) | ORAL | 1 refills | Status: AC

## 2024-10-21 ENCOUNTER — Telehealth: Payer: Self-pay | Admitting: *Deleted

## 2024-10-21 NOTE — Progress Notes (Signed)
 Complex Care Management Note Care Guide Note  10/21/2024 Name: [name] MRN: [id number] DOB: 04-08-1946   Complex Care Management Outreach Attempts: An unsuccessful telephone outreach was attempted today to offer the patient information about available complex care management services.  Follow Up Plan:  Additional outreach attempts will be made to offer the patient complex care management information and services.   Encounter Outcome:  No Answer  Thedford Franks, CMA, AAMA [place]  [facility], [facility] Guide, Lead Direct Dial: [phone]  Fax: [phone]

## 2024-10-24 NOTE — Progress Notes (Unsigned)
 Complex Care Management Note Care Guide Note  10/24/2024 Name: KEYSHAUN EXLEY MRN: [id number] DOB: 07/04/1946   Complex Care Management Outreach Attempts: A second unsuccessful outreach was attempted today to offer the patient with information about available complex care management services.  Follow Up Plan:  Additional outreach attempts will be made to offer the patient complex care management information and services.   Encounter Outcome:  No Answer  Doyce [facility]  [facility], [facility] Guide Direct Dial: [phone]  Fax: [phone]

## 2024-10-25 NOTE — Progress Notes (Signed)
 Complex Care Management Note Care Guide Note  10/25/2024 Name: [name] MRN: [id number] DOB: 20-Dec-1945   Complex Care Management Outreach Attempts: A third unsuccessful outreach was attempted today to offer the patient with information about available complex care management services.  Follow Up Plan:  No further outreach attempts will be made at this time. We have been unable to contact the patient to offer or enroll patient in complex care management services.  Encounter Outcome:  No Answer  Thedford Franks, CMA, AAMA [place]  [facility], [facility] Guide, Lead Direct Dial: [phone]  Fax: [phone]

## 2024-10-27 ENCOUNTER — Telehealth: Payer: Self-pay | Admitting: Neurology

## 2024-10-27 NOTE — Telephone Encounter (Signed)
 He seems to be doing better. Moods better some. Shaking and mobility has gotten worse. What are thoughts about going back on carbi/levo?  She would like to talk with chelsea or Dr.Tat about this.

## 2024-10-31 ENCOUNTER — Encounter: Payer: Self-pay | Admitting: Neurology

## 2024-11-01 NOTE — Telephone Encounter (Signed)
Answered patient on mychart.

## 2024-11-09 ENCOUNTER — Telehealth: Payer: Self-pay | Admitting: Internal Medicine

## 2024-11-09 NOTE — Telephone Encounter (Signed)
 Called patient to verify upcoming appointment and was told by family member that he had passed away on 11/29/2024.

## 2024-11-10 ENCOUNTER — Ambulatory Visit: Admitting: Internal Medicine

## 2024-12-07 ENCOUNTER — Telehealth: Admitting: Neurology

## 2025-01-17 ENCOUNTER — Telehealth: Payer: Self-pay | Admitting: Neurology

## 2025-08-09 ENCOUNTER — Inpatient Hospital Stay

## 2025-08-16 ENCOUNTER — Inpatient Hospital Stay: Admitting: Internal Medicine
